# Patient Record
Sex: Male | Born: 1940 | Race: White | Hispanic: No | Smoking: Never smoker
Health system: Southern US, Community
[De-identification: ages and names within clinical notes are randomized; demographics above are authoritative.]

## PROBLEM LIST (undated history)

## (undated) DIAGNOSIS — I10 Essential (primary) hypertension: Secondary | ICD-10-CM

## (undated) DIAGNOSIS — D126 Benign neoplasm of colon, unspecified: Secondary | ICD-10-CM

## (undated) DIAGNOSIS — Z85820 Personal history of malignant melanoma of skin: Secondary | ICD-10-CM

## (undated) DIAGNOSIS — N529 Male erectile dysfunction, unspecified: Secondary | ICD-10-CM

## (undated) DIAGNOSIS — K589 Irritable bowel syndrome without diarrhea: Secondary | ICD-10-CM

## (undated) DIAGNOSIS — K7689 Other specified diseases of liver: Secondary | ICD-10-CM

## (undated) DIAGNOSIS — E785 Hyperlipidemia, unspecified: Secondary | ICD-10-CM

## (undated) DIAGNOSIS — N200 Calculus of kidney: Secondary | ICD-10-CM

## (undated) DIAGNOSIS — Z9889 Other specified postprocedural states: Secondary | ICD-10-CM

## (undated) DIAGNOSIS — R112 Nausea with vomiting, unspecified: Secondary | ICD-10-CM

## (undated) DIAGNOSIS — E559 Vitamin D deficiency, unspecified: Secondary | ICD-10-CM

## (undated) DIAGNOSIS — C439 Malignant melanoma of skin, unspecified: Secondary | ICD-10-CM

## (undated) HISTORY — DX: Malignant melanoma of skin, unspecified: C43.9

## (undated) HISTORY — DX: Benign neoplasm of colon, unspecified: D12.6

## (undated) HISTORY — DX: Hyperlipidemia, unspecified: E78.5

## (undated) HISTORY — DX: Male erectile dysfunction, unspecified: N52.9

## (undated) HISTORY — DX: Other specified diseases of liver: K76.89

## (undated) HISTORY — DX: Vitamin D deficiency, unspecified: E55.9

## (undated) HISTORY — PX: AMPUTATION: SHX166

## (undated) HISTORY — DX: Irritable bowel syndrome, unspecified: K58.9

## (undated) HISTORY — DX: Essential (primary) hypertension: I10

## (undated) HISTORY — DX: Calculus of kidney: N20.0

## (undated) HISTORY — DX: Personal history of malignant melanoma of skin: Z85.820

---
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
## 1965-12-17 HISTORY — PX: ORIF ANKLE FRACTURE: SUR919

## 1993-12-17 HISTORY — PX: MELANOMA EXCISION: SHX5266

## 1993-12-17 HISTORY — PX: LAMINECTOMY: SHX219

## 2000-10-17 ENCOUNTER — Encounter: Payer: Self-pay | Admitting: Family Medicine

## 2000-10-17 LAB — CONVERTED CEMR LAB: PSA: 1.2 ng/mL

## 2000-11-04 ENCOUNTER — Encounter: Payer: Self-pay | Admitting: Family Medicine

## 2000-11-04 LAB — CONVERTED CEMR LAB
PSA: 1.2 ng/mL
TSH: 1.92 microintl units/mL

## 2001-09-09 ENCOUNTER — Ambulatory Visit (HOSPITAL_COMMUNITY): Admission: RE | Admit: 2001-09-09 | Discharge: 2001-09-09 | Payer: Self-pay | Admitting: Neurosurgery

## 2001-09-09 ENCOUNTER — Encounter: Payer: Self-pay | Admitting: Neurosurgery

## 2002-03-17 ENCOUNTER — Encounter: Payer: Self-pay | Admitting: Family Medicine

## 2002-03-17 LAB — CONVERTED CEMR LAB: PSA: 0.7 ng/mL

## 2002-04-06 ENCOUNTER — Encounter: Payer: Self-pay | Admitting: Family Medicine

## 2002-04-06 LAB — CONVERTED CEMR LAB
PSA: 0.7 ng/mL
TSH: 1.55 microintl units/mL

## 2002-07-03 ENCOUNTER — Ambulatory Visit (HOSPITAL_COMMUNITY): Admission: RE | Admit: 2002-07-03 | Discharge: 2002-07-03 | Payer: Self-pay | Admitting: Gastroenterology

## 2002-07-20 ENCOUNTER — Encounter: Admission: RE | Admit: 2002-07-20 | Discharge: 2002-07-20 | Payer: Self-pay | Admitting: Neurosurgery

## 2002-07-20 ENCOUNTER — Encounter: Payer: Self-pay | Admitting: Neurosurgery

## 2002-12-17 ENCOUNTER — Encounter: Payer: Self-pay | Admitting: Family Medicine

## 2002-12-17 LAB — CONVERTED CEMR LAB: PSA: 0.9 ng/mL

## 2002-12-23 ENCOUNTER — Encounter: Payer: Self-pay | Admitting: Family Medicine

## 2002-12-23 LAB — CONVERTED CEMR LAB
PSA: 0.9 ng/mL
TSH: 1.8 microintl units/mL

## 2004-03-17 ENCOUNTER — Encounter: Payer: Self-pay | Admitting: Family Medicine

## 2004-03-17 LAB — CONVERTED CEMR LAB: PSA: 0.7 ng/mL

## 2004-03-21 ENCOUNTER — Encounter: Payer: Self-pay | Admitting: Family Medicine

## 2004-03-21 LAB — CONVERTED CEMR LAB: PSA: 0.7 ng/mL

## 2004-04-16 ENCOUNTER — Encounter: Payer: Self-pay | Admitting: Family Medicine

## 2004-04-16 LAB — CONVERTED CEMR LAB: PSA: 0.7 ng/mL

## 2004-05-10 ENCOUNTER — Encounter: Payer: Self-pay | Admitting: Family Medicine

## 2004-05-10 LAB — CONVERTED CEMR LAB
PSA: 0.7 ng/mL
TSH: 2.32 microintl units/mL

## 2004-10-17 ENCOUNTER — Ambulatory Visit: Payer: Self-pay | Admitting: Family Medicine

## 2005-02-20 ENCOUNTER — Ambulatory Visit: Payer: Self-pay | Admitting: Family Medicine

## 2005-02-23 ENCOUNTER — Ambulatory Visit: Payer: Self-pay | Admitting: Family Medicine

## 2005-03-12 ENCOUNTER — Encounter: Payer: Self-pay | Admitting: Family Medicine

## 2005-03-12 LAB — CONVERTED CEMR LAB: PSA: 0.8 ng/mL

## 2005-07-24 ENCOUNTER — Ambulatory Visit: Payer: Self-pay | Admitting: Family Medicine

## 2005-07-24 LAB — CONVERTED CEMR LAB: TSH: 2.4 microintl units/mL

## 2005-07-26 ENCOUNTER — Ambulatory Visit: Payer: Self-pay | Admitting: Family Medicine

## 2005-08-17 ENCOUNTER — Ambulatory Visit: Payer: Self-pay | Admitting: Family Medicine

## 2005-09-20 ENCOUNTER — Ambulatory Visit (HOSPITAL_COMMUNITY): Admission: RE | Admit: 2005-09-20 | Discharge: 2005-09-20 | Payer: Self-pay | Admitting: Urology

## 2005-09-22 ENCOUNTER — Emergency Department (HOSPITAL_COMMUNITY): Admission: EM | Admit: 2005-09-22 | Discharge: 2005-09-22 | Payer: Self-pay | Admitting: Emergency Medicine

## 2007-07-08 ENCOUNTER — Ambulatory Visit: Payer: Self-pay | Admitting: Family Medicine

## 2007-07-08 ENCOUNTER — Encounter: Payer: Self-pay | Admitting: Family Medicine

## 2007-07-08 DIAGNOSIS — Z87448 Personal history of other diseases of urinary system: Secondary | ICD-10-CM | POA: Insufficient documentation

## 2007-07-08 DIAGNOSIS — E785 Hyperlipidemia, unspecified: Secondary | ICD-10-CM | POA: Insufficient documentation

## 2007-07-08 DIAGNOSIS — D126 Benign neoplasm of colon, unspecified: Secondary | ICD-10-CM | POA: Insufficient documentation

## 2007-07-08 DIAGNOSIS — F528 Other sexual dysfunction not due to a substance or known physiological condition: Secondary | ICD-10-CM | POA: Insufficient documentation

## 2007-07-08 LAB — CONVERTED CEMR LAB
ALT: 21 units/L (ref 0–53)
AST: 18 units/L (ref 0–37)
Albumin: 4.1 g/dL (ref 3.5–5.2)
Alkaline Phosphatase: 32 units/L — ABNORMAL LOW (ref 39–117)
BUN: 11 mg/dL (ref 6–23)
Basophils Absolute: 0 10*3/uL (ref 0.0–0.1)
Basophils Relative: 0.7 % (ref 0.0–1.0)
Bilirubin, Direct: 0.1 mg/dL (ref 0.0–0.3)
CO2: 26 meq/L (ref 19–32)
Calcium: 8.9 mg/dL (ref 8.4–10.5)
Chloride: 106 meq/L (ref 96–112)
Cholesterol: 228 mg/dL (ref 0–200)
Creatinine, Ser: 1.1 mg/dL (ref 0.4–1.5)
Direct LDL: 171.9 mg/dL
Eosinophils Absolute: 0.4 10*3/uL (ref 0.0–0.6)
Eosinophils Relative: 7.4 % — ABNORMAL HIGH (ref 0.0–5.0)
GFR calc Af Amer: 86 mL/min
GFR calc non Af Amer: 71 mL/min
Glucose, Bld: 95 mg/dL (ref 70–99)
HCT: 41.4 % (ref 39.0–52.0)
HDL: 25.3 mg/dL — ABNORMAL LOW (ref >39.0)
Hemoglobin: 14.5 g/dL (ref 13.0–17.0)
Lymphocytes Relative: 33.7 % (ref 12.0–46.0)
MCHC: 35 g/dL (ref 30.0–36.0)
MCV: 92.2 fL (ref 78.0–100.0)
Monocytes Absolute: 0.5 10*3/uL (ref 0.2–0.7)
Monocytes Relative: 8.6 % (ref 3.0–11.0)
Neutro Abs: 2.7 10*3/uL (ref 1.4–7.7)
Neutrophils Relative %: 49.6 % (ref 43.0–77.0)
PSA: 0.97 ng/mL (ref 0.10–4.00)
Platelets: 166 10*3/uL (ref 150–400)
Potassium: 4.2 meq/L (ref 3.5–5.1)
RBC: 4.49 M/uL (ref 4.22–5.81)
RDW: 12.6 % (ref 11.5–14.6)
Sodium: 137 meq/L (ref 135–145)
TSH: 3.02 microintl units/mL (ref 0.35–5.50)
Total Bilirubin: 0.8 mg/dL (ref 0.3–1.2)
Total CHOL/HDL Ratio: 9
Total Protein: 6.5 g/dL (ref 6.0–8.3)
Triglycerides: 107 mg/dL (ref 0–149)
VLDL: 21 mg/dL (ref 0–40)
WBC: 5.4 10*3/uL (ref 4.5–10.5)

## 2007-07-15 ENCOUNTER — Ambulatory Visit: Payer: Self-pay | Admitting: Family Medicine

## 2007-08-01 ENCOUNTER — Ambulatory Visit: Payer: Self-pay | Admitting: Family Medicine

## 2007-08-04 ENCOUNTER — Encounter (INDEPENDENT_AMBULATORY_CARE_PROVIDER_SITE_OTHER): Payer: Self-pay | Admitting: *Deleted

## 2007-08-26 ENCOUNTER — Ambulatory Visit: Payer: Self-pay | Admitting: Family Medicine

## 2007-08-27 LAB — CONVERTED CEMR LAB
ALT: 22 units/L (ref 0–53)
AST: 20 units/L (ref 0–37)

## 2007-10-20 ENCOUNTER — Ambulatory Visit: Payer: Self-pay | Admitting: Family Medicine

## 2007-10-20 LAB — CONVERTED CEMR LAB
ALT: 19 units/L (ref 0–53)
AST: 17 units/L (ref 0–37)
Cholesterol: 138 mg/dL (ref 0–200)
HDL: 30.7 mg/dL — ABNORMAL LOW (ref >39.0)
LDL Cholesterol: 94 mg/dL (ref 0–99)
Total CHOL/HDL Ratio: 4.5
Triglycerides: 68 mg/dL (ref 0–149)
VLDL: 14 mg/dL (ref 0–40)

## 2007-10-22 ENCOUNTER — Ambulatory Visit: Payer: Self-pay | Admitting: Family Medicine

## 2007-11-24 ENCOUNTER — Encounter: Payer: Self-pay | Admitting: Family Medicine

## 2008-05-24 ENCOUNTER — Encounter: Payer: Self-pay | Admitting: Family Medicine

## 2008-09-14 ENCOUNTER — Ambulatory Visit: Payer: Self-pay | Admitting: Family Medicine

## 2008-09-14 DIAGNOSIS — I10 Essential (primary) hypertension: Secondary | ICD-10-CM | POA: Insufficient documentation

## 2008-10-08 ENCOUNTER — Telehealth: Payer: Self-pay | Admitting: Family Medicine

## 2008-10-12 ENCOUNTER — Ambulatory Visit: Payer: Self-pay | Admitting: Family Medicine

## 2008-10-12 LAB — CONVERTED CEMR LAB
BUN: 11 mg/dL (ref 6–23)
CO2: 31 meq/L (ref 19–32)
Calcium: 8.8 mg/dL (ref 8.4–10.5)
Chloride: 108 meq/L (ref 96–112)
Creatinine, Ser: 0.9 mg/dL (ref 0.4–1.5)
GFR calc Af Amer: 109 mL/min
GFR calc non Af Amer: 90 mL/min
Glucose, Bld: 82 mg/dL (ref 70–99)
Potassium: 4.1 meq/L (ref 3.5–5.1)
Sodium: 144 meq/L (ref 135–145)

## 2008-10-21 ENCOUNTER — Ambulatory Visit: Payer: Self-pay | Admitting: Family Medicine

## 2008-11-18 ENCOUNTER — Telehealth: Payer: Self-pay | Admitting: Family Medicine

## 2008-11-24 ENCOUNTER — Ambulatory Visit: Payer: Self-pay | Admitting: Family Medicine

## 2008-11-24 LAB — CONVERTED CEMR LAB
ALT: 18 units/L (ref 0–53)
AST: 19 units/L (ref 0–37)
Albumin: 4.1 g/dL (ref 3.5–5.2)
Alkaline Phosphatase: 36 units/L — ABNORMAL LOW (ref 39–117)
BUN: 12 mg/dL (ref 6–23)
Bilirubin, Direct: 0.1 mg/dL (ref 0.0–0.3)
CO2: 31 meq/L (ref 19–32)
Calcium: 9.1 mg/dL (ref 8.4–10.5)
Chloride: 109 meq/L (ref 96–112)
Cholesterol: 146 mg/dL (ref 0–200)
Creatinine, Ser: 1 mg/dL (ref 0.4–1.5)
GFR calc Af Amer: 96 mL/min
GFR calc non Af Amer: 79 mL/min
Glucose, Bld: 91 mg/dL (ref 70–99)
HDL: 27.1 mg/dL — ABNORMAL LOW (ref >39.0)
LDL Cholesterol: 102 mg/dL — ABNORMAL HIGH (ref 0–99)
Potassium: 4.5 meq/L (ref 3.5–5.1)
Sodium: 143 meq/L (ref 135–145)
Total Bilirubin: 0.8 mg/dL (ref 0.3–1.2)
Total CHOL/HDL Ratio: 5.4
Total Protein: 6.9 g/dL (ref 6.0–8.3)
Triglycerides: 84 mg/dL (ref 0–149)
VLDL: 17 mg/dL (ref 0–40)

## 2008-12-01 ENCOUNTER — Ambulatory Visit: Payer: Self-pay | Admitting: Family Medicine

## 2008-12-24 ENCOUNTER — Ambulatory Visit: Payer: Self-pay | Admitting: Family Medicine

## 2008-12-24 LAB — FECAL OCCULT BLOOD, GUAIAC: Fecal Occult Blood: NEGATIVE

## 2008-12-24 LAB — CONVERTED CEMR LAB
OCCULT 1: NEGATIVE
OCCULT 2: NEGATIVE
OCCULT 3: NEGATIVE

## 2008-12-27 ENCOUNTER — Encounter (INDEPENDENT_AMBULATORY_CARE_PROVIDER_SITE_OTHER): Payer: Self-pay | Admitting: *Deleted

## 2009-01-17 ENCOUNTER — Ambulatory Visit: Payer: Self-pay | Admitting: Family Medicine

## 2009-02-18 ENCOUNTER — Ambulatory Visit: Payer: Self-pay | Admitting: Gastroenterology

## 2009-03-04 ENCOUNTER — Ambulatory Visit: Payer: Self-pay | Admitting: Gastroenterology

## 2009-03-04 LAB — HM COLONOSCOPY

## 2009-06-29 ENCOUNTER — Encounter: Payer: Self-pay | Admitting: Family Medicine

## 2009-08-12 ENCOUNTER — Encounter: Admission: RE | Admit: 2009-08-12 | Discharge: 2009-08-12 | Payer: Self-pay | Admitting: Surgery

## 2009-08-12 DIAGNOSIS — K7689 Other specified diseases of liver: Secondary | ICD-10-CM | POA: Insufficient documentation

## 2009-10-03 ENCOUNTER — Observation Stay (HOSPITAL_COMMUNITY): Admission: RE | Admit: 2009-10-03 | Discharge: 2009-10-04 | Payer: Self-pay | Admitting: Surgery

## 2009-10-03 ENCOUNTER — Encounter: Payer: Self-pay | Admitting: Family Medicine

## 2009-10-03 ENCOUNTER — Encounter (INDEPENDENT_AMBULATORY_CARE_PROVIDER_SITE_OTHER): Payer: Self-pay | Admitting: Surgery

## 2009-10-03 DIAGNOSIS — R930 Abnormal findings on diagnostic imaging of skull and head, not elsewhere classified: Secondary | ICD-10-CM | POA: Insufficient documentation

## 2009-10-03 HISTORY — PX: CHOLECYSTECTOMY: SHX55

## 2009-10-19 ENCOUNTER — Encounter: Payer: Self-pay | Admitting: Family Medicine

## 2009-12-22 ENCOUNTER — Ambulatory Visit: Payer: Self-pay | Admitting: Family Medicine

## 2009-12-23 ENCOUNTER — Encounter: Admission: RE | Admit: 2009-12-23 | Discharge: 2009-12-23 | Payer: Self-pay | Admitting: Family Medicine

## 2010-01-25 ENCOUNTER — Ambulatory Visit: Payer: Self-pay | Admitting: Family Medicine

## 2010-01-25 LAB — CONVERTED CEMR LAB
ALT: 32 units/L (ref 0–53)
AST: 21 units/L (ref 0–37)
Albumin: 4.3 g/dL (ref 3.5–5.2)
Alkaline Phosphatase: 42 units/L (ref 39–117)
BUN: 12 mg/dL (ref 6–23)
Basophils Absolute: 0 10*3/uL (ref 0.0–0.1)
Basophils Relative: 0.5 % (ref 0.0–3.0)
Bilirubin, Direct: 0 mg/dL (ref 0.0–0.3)
CO2: 29 meq/L (ref 19–32)
Calcium: 10 mg/dL (ref 8.4–10.5)
Chloride: 107 meq/L (ref 96–112)
Cholesterol: 117 mg/dL (ref 0–200)
Creatinine, Ser: 1 mg/dL (ref 0.4–1.5)
Eosinophils Absolute: 0.4 10*3/uL (ref 0.0–0.7)
Eosinophils Relative: 7.2 % — ABNORMAL HIGH (ref 0.0–5.0)
GFR calc non Af Amer: 79.04 mL/min (ref >60)
Glucose, Bld: 88 mg/dL (ref 70–99)
HCT: 45.3 % (ref 39.0–52.0)
HDL: 34.9 mg/dL — ABNORMAL LOW (ref >39.00)
Hemoglobin: 14.8 g/dL (ref 13.0–17.0)
Iron: 105 ug/dL (ref 42–165)
LDL Cholesterol: 64 mg/dL (ref 0–99)
Lymphocytes Relative: 34.8 % (ref 12.0–46.0)
Lymphs Abs: 1.9 10*3/uL (ref 0.7–4.0)
MCHC: 32.6 g/dL (ref 30.0–36.0)
MCV: 94.8 fL (ref 78.0–100.0)
Monocytes Absolute: 0.5 10*3/uL (ref 0.1–1.0)
Monocytes Relative: 8.2 % (ref 3.0–12.0)
Neutro Abs: 2.7 10*3/uL (ref 1.4–7.7)
Neutrophils Relative %: 49.3 % (ref 43.0–77.0)
PSA: 1.04 ng/mL (ref 0.10–4.00)
Phosphorus: 4.7 mg/dL — ABNORMAL HIGH (ref 2.3–4.6)
Platelets: 184 10*3/uL (ref 150.0–400.0)
Potassium: 4.3 meq/L (ref 3.5–5.1)
RBC: 4.78 M/uL (ref 4.22–5.81)
RDW: 12.6 % (ref 11.5–14.6)
Sodium: 141 meq/L (ref 135–145)
TSH: 3.27 microintl units/mL (ref 0.35–5.50)
Total Bilirubin: 0.5 mg/dL (ref 0.3–1.2)
Total CHOL/HDL Ratio: 3
Total Protein: 7 g/dL (ref 6.0–8.3)
Triglycerides: 90 mg/dL (ref 0.0–149.0)
VLDL: 18 mg/dL (ref 0.0–40.0)
WBC: 5.5 10*3/uL (ref 4.5–10.5)

## 2010-01-26 LAB — CONVERTED CEMR LAB: Vit D, 25-Hydroxy: 23 ng/mL — ABNORMAL LOW (ref 30–89)

## 2010-01-30 ENCOUNTER — Ambulatory Visit: Payer: Self-pay | Admitting: Family Medicine

## 2010-01-30 DIAGNOSIS — E559 Vitamin D deficiency, unspecified: Secondary | ICD-10-CM | POA: Insufficient documentation

## 2010-05-11 ENCOUNTER — Telehealth: Payer: Self-pay | Admitting: Family Medicine

## 2010-07-25 ENCOUNTER — Encounter (INDEPENDENT_AMBULATORY_CARE_PROVIDER_SITE_OTHER): Payer: Self-pay | Admitting: *Deleted

## 2010-10-11 ENCOUNTER — Encounter: Payer: Self-pay | Admitting: Family Medicine

## 2010-12-28 ENCOUNTER — Encounter: Payer: Self-pay | Admitting: Gastroenterology

## 2010-12-28 ENCOUNTER — Other Ambulatory Visit: Payer: Self-pay | Admitting: Family Medicine

## 2010-12-28 ENCOUNTER — Ambulatory Visit
Admission: RE | Admit: 2010-12-28 | Discharge: 2010-12-28 | Payer: Self-pay | Source: Home / Self Care | Attending: Family Medicine | Admitting: Family Medicine

## 2010-12-28 DIAGNOSIS — Z9089 Acquired absence of other organs: Secondary | ICD-10-CM | POA: Insufficient documentation

## 2010-12-28 DIAGNOSIS — R1011 Right upper quadrant pain: Secondary | ICD-10-CM | POA: Insufficient documentation

## 2010-12-28 LAB — HEPATIC FUNCTION PANEL
ALT: 37 U/L (ref 0–53)
AST: 28 U/L (ref 0–37)
Albumin: 4.5 g/dL (ref 3.5–5.2)
Alkaline Phosphatase: 42 U/L (ref 39–117)
Bilirubin, Direct: 0.1 mg/dL (ref 0.0–0.3)
Total Bilirubin: 0.8 mg/dL (ref 0.3–1.2)
Total Protein: 7.3 g/dL (ref 6.0–8.3)

## 2010-12-28 LAB — GAMMA GT: GGT: 21 U/L (ref 7–51)

## 2010-12-28 LAB — LIPASE: Lipase: 22 U/L (ref 11.0–59.0)

## 2010-12-28 LAB — AMYLASE: Amylase: 104 U/L (ref 27–131)

## 2011-01-18 NOTE — Letter (Signed)
Summary: Dr.Todd Leotis Shames  Dr.Todd Gerkin,[facility],Note   Imported By: Beau Fanny 10/25/2009 10:39:43  _____________________________________________________________________  External Attachment:    Type:   Image     Comment:   External Document

## 2011-01-18 NOTE — Letter (Signed)
Summary: Results Follow up Letter  [facility]  [address] [place], [place] [address]   Phone: [phone]  Fax: [phone]    08/04/2007 MRN: [id number]  BROC CASPERS [address] [place], [place]  [address]  Dear Mr. Schedler,  The following are the results of your recent test(s):  Test         Result    Pap Smear:        Normal _____  Not Normal _____ Comments: ______________________________________________________ Cholesterol: LDL(Bad cholesterol):         Your goal is less than:         HDL (Good cholesterol):       Your goal is more than: Comments:  ______________________________________________________ Mammogram:        Normal _____  Not Normal _____ Comments:  ___________________________________________________________________ Hemoccult:        Normal __X___  Not normal _______ Comments:  NO BLOOD IN STOOL. THANK YOU FOR RETURNING THE HEMOCULT CARDS. PLEASE MAKE SURE TO REPEAT IN ONE YEAR.  _____________________________________________________________________ Other Tests:    We routinely do not discuss normal results over the telephone.  If you desire a copy of the results, or you have any questions about this information we can discuss them at your next office visit.   Sincerely,

## 2011-01-18 NOTE — Progress Notes (Signed)
Summary: Viagra Rx.  Phone Note Call from Patient Call back at Home Phone [phone]   Caller: Patient Call For: Shaune Leeks MD Summary of Call: Patient is asking for a Rx. for Viagra to be sent to [facility] on [address].  He says the two of you spoke about it at his last OV and you did not have any samples. Initial call taken by: Delilah Shan CMA (AAMA),  May 11, 2010 11:50 AM    New/Updated Medications: VIAGRA 100 MG TABS (SILDENAFIL CITRATE) 1/2-1 tab by mouth 1 hr prior to desired activity Prescriptions: VIAGRA 100 MG TABS (SILDENAFIL CITRATE) 1/2-1 tab by mouth 1 hr prior to desired activity  #10 x 5   Entered and Authorized by:   Shaune Leeks MD   Signed by:   Shaune Leeks MD on 05/11/2010   Method used:   Electronically to        [facility] [phone]* (retail)       [address]       [place], [place]  [address]       Ph: [phone]       Fax: [phone]   RxID:   [id number]

## 2011-01-18 NOTE — Assessment & Plan Note (Signed)
Summary: 3 MONTH FOLLOW UP/[facility]   Vital Signs:  Patient Profile:   70 Years Old Male Height:     69 inches (180.34 cm) Weight:      199 pounds Temp:     98.7 degrees F oral Pulse rate:   60 / minute Pulse rhythm:   regular BP sitting:   130 / 70  (left arm) Cuff size:   regular                 Chief Complaint:  3 month f/u.  History of Present Illness: Doing well with no complaints.  Feels fine and tolerating Simvastatin w/o difficulty.  Current Allergies: No known allergies       Physical Exam  General:     Well-developed,well-nourished,in no acute distress; alert,appropriate and cooperative throughout examination Head:     Normocephalic and atraumatic without obvious abnormalities. No apparent alopecia or balding. Eyes:     Conjunctiva clear bilaterally.  Ears:     External ear exam shows no significant lesions or deformities.  Otoscopic examination reveals clear canals, tympanic membranes are intact bilaterally without bulging, retraction, inflammation or discharge. Hearing is grossly normal bilaterally. Nose:     External nasal examination shows no deformity or inflammation. Nasal mucosa are pink and moist without lesions or exudates. Mouth:     Oral mucosa and oropharynx without lesions or exudates.  Teeth in good repair. Neck:     No deformities, masses, or tenderness noted. Chest Wall:     No deformities, masses, tenderness or gynecomastia noted. Lungs:     Normal respiratory effort, chest expands symmetrically. Lungs are clear to auscultation, no crackles or wheezes. Heart:     Normal rate and regular rhythm. S1 and S2 normal without gallop, murmur, click, rub or other extra sounds.    Impression & Recommendations:  Problem # 1:  HYPERLIPIDEMIA (ICD-272.4) Assessment: Improved Discussed labs..great response.  Can still improve , esp HDL...start regular walking, lose weight. His updated medication list for this problem includes:    Simvastatin 40  Mg Tabs (Simvastatin) ..... One tab by mouth at night  Labs Reviewed: Chol: 138 (10/20/2007)   HDL: 30.7 (10/20/2007)   LDL: 94 (10/20/2007)   TG: 68 (10/20/2007) SGOT: 17 (10/20/2007)   SGPT: 19 (10/20/2007) LDL 171 to 94!  Complete Medication List: 1)  Fish Oil 1000 Mg Caps (Omega-3 fatty acids) .... 2 q am 2)  Simvastatin 40 Mg Tabs (Simvastatin) .... One tab by mouth at night   Patient Instructions: 1)  RTA 8/09 for COMP EXAM, labs prior. 2)  RTC sooner as needed.    ]

## 2011-01-18 NOTE — Assessment & Plan Note (Signed)
Summary: 6 WEEK FOLLOW UP / LFW   Vital Signs:  Patient Profile:   70 Years Old Male Height:     69 inches (180.34 cm) Weight:      209 pounds Temp:     98.1 degrees F oral Pulse rate:   80 / minute Pulse rhythm:   regular BP sitting:   120 / 60  (left arm) Cuff size:   large  Vitals Entered By: Providence Crosby (January 17, 2009 11:03 AM)                 Chief Complaint:  6 week followup// taking 2 norvasc 5mg  daily.  History of Present Illness: Pt is here for followup of Htn, having had cough from Lisinopril so started him on Amlodopuine 5mg  a day. His pressure improved but was still over 140 after two weeks so he increased his dose to 5mg  two times a day. His pressure has been great and he has had no problems with the medication. He feels well today. He is looking for referral to GI in [place] and is to be coordinated today. He also asked for Viagra samples which we do not have.    Prior Medications Reviewed Using: Patient Recall  Current Allergies: No known allergies       Physical Exam  General:     Well-developed,well-nourished,in no acute distress; alert,appropriate and cooperative throughout examination Head:     Normocephalic and atraumatic without obvious abnormalities. No apparent alopecia or balding. Eyes:     Conjunctiva clear bilaterally.  Ears:     External ear exam shows no significant lesions or deformities.  Otoscopic examination reveals clear canals, tympanic membranes are intact bilaterally without bulging, retraction, inflammation or discharge. Hearing is grossly normal bilaterally. Nose:     External nasal examination shows no deformity or inflammation. Nasal mucosa are pink and moist without lesions or exudates. Mouth:     Oral mucosa and oropharynx without lesions or exudates.  Teeth in good repair. Neck:     No deformities, masses, or tenderness noted. Chest Wall:     No deformities, masses, tenderness or gynecomastia noted. Lungs:  Normal respiratory effort, chest expands symmetrically. Lungs are clear to auscultation, no crackles or wheezes. Heart:     Normal rate and regular rhythm. S1 and S2 normal without gallop, murmur, click, rub or other extra sounds.    Impression & Recommendations:  Problem # 1:  ESSENTIAL HYPERTENSION (ICD-401.9) Assessment: Improved Will cont 5mg  two times a day as he is tolerating that regimen well with good results. The following medications were removed from the medication list:    Amlodipine Besylate 5 Mg Tabs (Amlodipine besylate) ..... One tab by mouth at night.  His updated medication list for this problem includes:    Amlodipine Besylate 5 Mg Tabs (Amlodipine besylate) ..... One tab by mouth two times a day  BP today: 120/60 Prior BP: 160/90 (12/01/2008)  Labs Reviewed: Creat: 1.0 (11/24/2008) Chol: 146 (11/24/2008)   HDL: 27.1 (11/24/2008)   LDL: 102 (11/24/2008)   TG: 84 (11/24/2008)   Problem # 2:  ERECTILE DYSFUNCTION (ICD-302.72) Assessment: Unchanged Declined script, had no samples.  Complete Medication List: 1)  Simvastatin 40 Mg Tabs (Simvastatin) .... One tab by mouth at night 2)  Vitamin E Blend 400 Unit Caps (Vitamin e) .Marland Kitchen.. 1 daily by mouth 3)  Bayer Aspirin 325 Mg Tabs (Aspirin) .... Takes as needed as needed 4)  Lyrica 75 Mg Caps (Pregabalin) .... Takes as needed  5)  Amlodipine Besylate 5 Mg Tabs (Amlodipine besylate) .... One tab by mouth two times a day   Patient Instructions: 1)  RTC 5 mos   Prescriptions: AMLODIPINE BESYLATE 5 MG TABS (AMLODIPINE BESYLATE) one tab by mouth two times a day  #60 x 12   Entered and Authorized by:   Shaune Leeks MD   Signed by:   Shaune Leeks MD on 01/17/2009   Method used:   Electronically to        [facility] [phone]* (retail)       [address]       [place], [place]  [address]       Ph: [phone]       Fax: [phone]   RxID:   [id number]

## 2011-01-18 NOTE — Assessment & Plan Note (Signed)
Summary: DRILL BIT WENT INTO FINGER/CLE   Vital Signs:  Patient profile:   70 year old male Weight:      203.25 pounds BMI:     30.12 Temp:     98.3 degrees F oral Pulse rate:   72 / minute Pulse rhythm:   regular BP sitting:   150 / 80  (left arm) Cuff size:   regular  Vitals Entered By: Linde Gillis CMA Duncan Dull) (December 22, 2009 2:21 PM) CC: drill bit went into finger   History of Present Illness: Eddie Fox is a 70 y/o caucasian male who presents today due to a wound on the medial side of his little finger of his left hand.  Two days ago he accidentally drilled a drill bit into his hand while perfomring woodwork.  He initially used hydrogen peroxide and has kept the would clean with soap and water at night.  During the day he bandages the wound and uses topical antibiotic creams.  He states his last tetanus shot was in 2003.   He also notes that he needs a f/u chest x-rayof 10/18 per Dr. Gerrit Friends.  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 One Tab By Mouth At Night 2)  Vitamin E Blend 400 Unit Caps (Vitamin E) .Marland Kitchen.. 1 Daily By Mouth 3)  Bayer Aspirin 325 Mg Tabs (Aspirin) .... Takes As Needed As Needed 4)  Lyrica 75 Mg Caps (Pregabalin) .... Takes As Needed 5)  Amlodipine Besylate 5 Mg Tabs (Amlodipine Besylate) .... One Tab By Mouth Two Times A  Day  Allergies (verified): No Known Drug Allergies  Physical Exam  General:  Well-developed,well-nourished,in no acute distress; alert,appropriate and cooperative throughout examination Head:  Normocephalic and atraumatic without obvious abnormalities. No apparent alopecia or balding. Eyes:  Conjunctiva clear bilat. Ears:  External ear exam shows no significant lesions or deformities.  Otoscopic examination reveals clear canals, tympanic membranes are intact bilaterally without bulging, retraction, inflammation or discharge. Hearing is grossly normal bilaterally. Nose:  External nasal examination shows no deformity or inflammation. Nasal mucosa are pink and moist without lesions or exudates. Mouth:  Oral mucosa and oropharynx without lesions or exudates.  Teeth in good repair. Lungs:  Normal respiratory effort, chest expands symmetrically. Lungs are clear to auscultation, no crackles or wheezes. Heart:  Normal rate and regular rhythm. S1 and S2 normal without gallop, murmur, click, rub or other extra sounds. Extremities:  Medial L hand little fingerwound present.  No erythem or swelling present. No discarge, difficult to assess if wound invlolves the phalanx.   Impression & Recommendations:  Problem # 1:  FINGER SUP FB W/O MAJ OPEN WOUND&W/O MENTION INF (ICD-915.6) Assessment New Healing well but must insure did not involve the bone. It will need antibiotics if it did involve. OItherwise cont trmt as he is. Orders: Radiology Referral (Radiology)  Problem # 2:  NONSPCIFC ABN FINDING RAD &  OTH EXAM LUNG FIELD (ICD-793.1) Assessment: Unchanged Will get followup CXR to compare.  Problem # 3:  ESSENTIAL HYPERTENSION (ICD-401.9) Assessment: Unchanged Slightly elevated but will observe. His updated medication list for this problem includes:    Amlodipine Besylate 5 Mg Tabs (Amlodipine besylate) ..... One tab by mouth two times a day  BP today: 150/80 Prior BP: 120/60  (01/17/2009)  Labs Reviewed: K+: 4.5 (11/24/2008) Creat: : 1.0 (11/24/2008)   Chol: 146 (11/24/2008)   HDL: 27.1 (11/24/2008)   LDL: 102 (11/24/2008)   TG: 84 (11/24/2008)  Complete Medication List: 1)  Simvastatin 40 Mg Tabs (Simvastatin) .... One tab by mouth at night 2)  Vitamin E Blend 400 Unit Caps (Vitamin e) .Marland Kitchen.. 1 daily by mouth 3)  Bayer Aspirin 325 Mg Tabs (Aspirin) .... Takes as needed as needed 4)  Lyrica 75 Mg Caps (Pregabalin) .... Takes as needed 5)  Amlodipine Besylate 5 Mg Tabs (Amlodipine besylate) .... One tab by mouth two times a day  Other Orders: TD Toxoids IM 7 YR + (45409) Admin 1st Vaccine ([address]) Admin 1st Vaccine [facility]) [phone])  Patient Instructions: 1)  Refer for CXR and finger Xray. 2)  Td today.  Current Allergies (reviewed today): No known allergies    Tetanus/Td Vaccine    Vaccine Type: Td    Site: right deltoid    Mfr: Sanofi Pasteur    Dose: 0.5 ml    Route: IM    Given by: Linde Gillis CMA (AAMA)    Exp. Date: 12/22/2011    Lot #: [id number]    VIS given: 11/04/07 version given December 22, 2009.

## 2011-01-18 NOTE — Procedures (Signed)
Summary: Colonoscopy   Colonoscopy  Procedure date:  03/04/2009  Findings:      Location:  [facility].    Procedures Next Due Date:    Colonoscopy: 03/2014  COLONOSCOPY PROCEDURE REPORT  PATIENT:  Eddie Fox, Eddie Fox  MR#:  [id number] BIRTHDATE:   01-03-1941, 67 yrs. old   GENDER:   male  ENDOSCOPIST:   Rachael Fee, MD Referred by: Laurita Quint, M.D.  PROCEDURE DATE:  03/04/2009 PROCEDURE:  Colonoscopy, diagnostic ASA CLASS:   Class II INDICATIONS: history of pre-cancerous (adenomatous) colon polyps (Dr. Marya Amsler previously)  MEDICATIONS:    Fentanyl 75 mcg IV, Versed 7 mg IV  DESCRIPTION OF PROCEDURE:   After the risks benefits and alternatives of the procedure were thoroughly explained, informed consent was obtained.  Digital rectal exam was performed and revealed no rectal masses.   The LB [id number] endoscope was introduced through the anus and advanced to the cecum, which was identified by both the appendix and ileocecal valve, without limitations.  The quality of the prep was good, using MoviPrep.  The instrument was then slowly withdrawn as the colon was fully examined. <<PROCEDUREIMAGES>>      <<OLD IMAGES>>  FINDINGS:  Mild diverticulosis was found sigmoid to descending  Internal and external hemorrhoids were found. These were small and non-thrombosed.  This was otherwise a normal examination (see image2, image3, and image4).  No polyps or cancers were seen.   Retroflexed views in the rectum revealed no abnormalities.    The scope was then withdrawn from the patient and the procedure completed.  COMPLICATIONS:   None  ENDOSCOPIC IMPRESSION:  1) Mild diverticulosis in the sigmoid to descending  2) Internal and external hemorrhoids  3) Otherwise normal examination  4) No polyps or cancers  RECOMMENDATIONS:  1) Given your personal history of pre-cancerous polyps (Dr. Melchor Amour), you will need a repeat colonoscopy in 5 years.  REPEAT  EXAM:   5 year colonoscopy   _______________________________ Rachael Fee, MD  CC: Laurita Quint, MD       Appended Document: Colonoscopy    Clinical Lists Changes  Observations: Added new observation of PAST SURG HX: TRAUMATIC AMPUTATION  DISTAL PHALANX OF R THUMB  FX R ANKLE ORIF 1967 BROKEN BACK  FELL OUT OF TREE STAND  1995 LAMINECTOMY L3-T11 H[place] RODS IN BACK (RDR ROY)  1995 DISLOCATED LEFT SHOULDER (DR WHITFIELD) 1995 MELANOMA EXCISION R UPPER ABD (DR JONES) 1995 BCC R UPPER ARM 10/1994 COLONOSCOPY  POLYPECTOMY (DR [facility]) 02/1997 COLONOSCOPY--NO POLYPS (DR. WISEMANN):(06/2002)----REPEAT IN 5 YEARS BACK INJECTION  09/2002 COLONOSCOPY  MILD DIVERTICS INT AND EXT HEMMS O/W NML NO POLYPS (DR Farmer Mccahill) 03/04/2009 (03/06/2009 9:35)       Past Surgical History:    TRAUMATIC AMPUTATION  DISTAL PHALANX OF R THUMB     FX R ANKLE ORIF 1967    BROKEN BACK  FELL OUT OF TREE STAND  1995    LAMINECTOMY L3-T11 H[place] RODS IN BACK (RDR ROY)  1995    DISLOCATED LEFT SHOULDER (DR WHITFIELD) 1995    MELANOMA EXCISION R UPPER ABD (DR JONES) 1995    BCC R UPPER ARM 10/1994    COLONOSCOPY  POLYPECTOMY (DR [facility]) 02/1997    COLONOSCOPY--NO POLYPS (DR. WISEMANN):(06/2002)----REPEAT IN 5 YEARS    BACK INJECTION  09/2002    COLONOSCOPY  MILD DIVERTICS INT AND EXT HEMMS O/W NML NO POLYPS (DR Carina Chaplin) 03/04/2009

## 2011-01-18 NOTE — Assessment & Plan Note (Signed)
Summary: BLOOD PRESSURE FLUCTUATING,LIGHTHEADED/BIR   Vital Signs:  Patient Profile:   70 Years Old Male Height:     69 inches (180.34 cm) Weight:      202 pounds Temp:     98.4 degrees F oral Pulse rate:   56 / minute Pulse rhythm:   regular Resp:     20 per minute BP sitting:   160 / 70  (left arm) Cuff size:   regular  Vitals Entered ByProvidence Crosby (September 14, 2008 10:02 AM)                 Chief Complaint:  blood pressure fluctuating // lightheaded.  History of Present Illness: Pt here for headache with BP fluctuation, mostly high. Thinks fish oil has helped.  Hearyt rate went to 46 Sat nite. Has significant head discomfort and slight nausea. Father had HBP also.    Prior Medications Reviewed Using: Patient Recall  Current Allergies: No known allergies    Family History:    FatherDECEASED 85 YOA CHF, HTN:     Mother: DECEASED 92 YOA (Nov 22, 2001) CHF    HTN, DM, STROKE    Brother dec 63 Trauma to head from fall     2 SISTERS BOTH  WITH HTN    CV: PATERNAL SIDE    HBP: +FATHER, + MOTHER, +SISTERS    DM: + MOTHER    GOUT/ARTHRITIS:     PROSTATE CANCER: MELANOMA SELF    BREAST/OVARIAN/UTERINE CANCER? ?MGM    COLON CANCER:    DEPRESSION: NEGATIVE    ETOH/DRUG ABUSE: NEGATIVE    OTHER: STROKE + MOTHER     Physical Exam  General:     Well-developed,well-nourished,in no acute distress; alert,appropriate and cooperative throughout examination Head:     Normocephalic and atraumatic without obvious abnormalities. No apparent alopecia or balding. Eyes:     Conjunctiva clear bilaterally.  Ears:     External ear exam shows no significant lesions or deformities.  Otoscopic examination reveals clear canals, tympanic membranes are intact bilaterally without bulging, retraction, inflammation or discharge. Hearing is grossly normal bilaterally. Nose:     External nasal examination shows no deformity or inflammation. Nasal mucosa are pink and moist without lesions  or exudates. Mouth:     Oral mucosa and oropharynx without lesions or exudates.  Teeth in good repair. Neck:     No deformities, masses, or tenderness noted. Chest Wall:     No deformities, masses, tenderness or gynecomastia noted. Lungs:     Normal respiratory effort, chest expands symmetrically. Lungs are clear to auscultation, no crackles or wheezes. Heart:     Normal rate and regular rhythm. S1 and S2 normal without gallop, murmur, click, rub or other extra sounds. Abdomen:     Bowel sounds positive,abdomen soft and non-tender without masses, organomegaly or hernias noted.    Impression & Recommendations:  Problem # 1:  ESSENTIAL HYPERTENSION (ICD-401.9) Assessment: New Start Lisinopril. Will avoid rate limiters due to slow heartrate. Bmet next time. His updated medication list for this problem includes:    Lisinopril 5 Mg Tabs (Lisinopril) ..... One tab by mouth at night.  BP today: 160/70 Prior BP: 130/70 (10/22/2007)  Labs Reviewed: Creat: 1.1 (07/08/2007) Chol: 138 (10/20/2007)   HDL: 30.7 (10/20/2007)   LDL: 94 (10/20/2007)   TG: 68 (10/20/2007)   Complete Medication List: 1)  Fish Oil 1000 Mg Caps (Omega-3 fatty acids) .... 2 q am 2)  Simvastatin 40 Mg Tabs (Simvastatin) .... One tab by mouth  at night 3)  Vitamin E Blend 400 Unit Caps (Vitamin e) .Marland Kitchen.. 1 daily by mouth 4)  Bayer Aspirin 325 Mg Tabs (Aspirin) .... Takes as needed as needed 5)  Lisinopril 5 Mg Tabs (Lisinopril) .... One tab by mouth at night.   Patient Instructions: 1)  RTC 6 weeks. BMET prior 2)  Check H/A and Nausea.   Prescriptions: LISINOPRIL 5 MG TABS (LISINOPRIL) one tab by mouth at night.  #30 x 12   Entered and Authorized by:   Shaune Leeks MD   Signed by:   Shaune Leeks MD on 09/14/2008   Method used:   Electronically to        [facility]* (retail)       [address]       [place], [place]  [address]       Ph: [phone]       Fax: [phone]   RxID:    [id number]  ]

## 2011-01-18 NOTE — Assessment & Plan Note (Signed)
Summary: 6 WK F/U  DLO   Vital Signs:  Patient Profile:   70 Years Old Male Height:     69 inches (180.34 cm) Weight:      202 pounds Temp:     98 degrees F oral Pulse rate:   76 / minute Pulse rhythm:   regular BP sitting:   130 / 80  (left arm) Cuff size:   regular  Vitals Entered By: Providence Crosby (October 21, 2008 10:30 AM)                 Chief Complaint:  6 WEEK FOLLOWUP BP.  History of Present Illness: Here for followup of BP, started on Lisinopril because of avoiding rate limiters from low rate baseline. He has tolerated the Lisnopril without difficulty . He takes it at night and last night had some increased urination but otherwise has not noticed any problems. He has not had lightheadedness like before altho he still occas has frontal light dull headache. OBTW, has minimal desire for and trouble maintaining erecctions. Asked if I could give him some Viagra. I had no samples and he declined a script.    Prior Medications Reviewed Using: Patient Recall  Current Allergies: No known allergies       Physical Exam  General:     Well-developed,well-nourished,in no acute distress; alert,appropriate and cooperative throughout examination Head:     Normocephalic and atraumatic without obvious abnormalities. No apparent alopecia or balding. Eyes:     Conjunctiva clear bilaterally.  Ears:     External ear exam shows no significant lesions or deformities.  Otoscopic examination reveals clear canals, tympanic membranes are intact bilaterally without bulging, retraction, inflammation or discharge. Hearing is grossly normal bilaterally. Nose:     External nasal examination shows no deformity or inflammation. Nasal mucosa are pink and moist without lesions or exudates. Mouth:     Oral mucosa and oropharynx without lesions or exudates.  Teeth in good repair. Neck:     No deformities, masses, or tenderness noted. Chest Wall:     No deformities, masses, tenderness or  gynecomastia noted. Lungs:     Normal respiratory effort, chest expands symmetrically. Lungs are clear to auscultation, no crackles or wheezes. Heart:     Normal rate and regular rhythm. S1 and S2 normal without gallop, murmur, click, rub or other extra sounds. Abdomen:     Bowel sounds positive,abdomen soft and non-tender without masses, organomegaly or hernias noted.    Impression & Recommendations:  Problem # 1:  ESSENTIAL HYPERTENSION (ICD-401.9) Assessment: Improved Will increase med. His updated medication list for this problem includes:    Lisinopril 10 Mg Tabs (Lisinopril) ..... One tab by mouth at night  BP today: 130/80  BP by me 160/95 Prior BP: 150/88 (10/12/2008)  Labs Reviewed: Creat: 0.9 (10/12/2008) Chol: 138 (10/20/2007)   HDL: 30.7 (10/20/2007)   LDL: 94 (10/20/2007)   TG: 68 (10/20/2007)   Complete Medication List: 1)  Fish Oil 1000 Mg Caps (Omega-3 fatty acids) .... 2 q am 2)  Simvastatin 40 Mg Tabs (Simvastatin) .... One tab by mouth at night 3)  Vitamin E Blend 400 Unit Caps (Vitamin e) .Marland Kitchen.. 1 daily by mouth 4)  Bayer Aspirin 325 Mg Tabs (Aspirin) .... Takes as needed as needed 5)  Lisinopril 10 Mg Tabs (Lisinopril) .... One tab by mouth at night   Patient Instructions: 1)  RTC 6 weeks, BMET 401.1 prior 2)  Check erections again next time.   Prescriptions:  LISINOPRIL 10 MG TABS (LISINOPRIL) one tab by mouth at night  #30 x 12   Entered and Authorized by:   Shaune Leeks MD   Signed by:   Shaune Leeks MD on 10/21/2008   Method used:   Print then Give to Patient   RxID:   [id number]  ]

## 2011-01-18 NOTE — Progress Notes (Signed)
Summary: BP still up  Phone Note Call from Patient Call back at Home Phone [phone]   Caller: Patient Call For: dr Wing Schoch Summary of Call: Pt states he was given a new BP medicine about 3 weeks ago but he says his BP is not coming down-- was 171/73 this am, 181/ 82 last night. He is taking this two times a day.  His pulse is running in the 50's.   He is asking if you can give him something stronger.  He uses [facility] [address]. Initial call taken by: Lowella Petties,  October 08, 2008 10:21 AM  Follow-up for Phone Call        Have pt take two of Lisinopril until  seen as scheduled. Follow-up by: Shaune Leeks MD,  October 09, 2008 8:27 PM  Additional Follow-up for Phone Call Additional follow up Details #1::        Advised pt. Additional Follow-up by: Lowella Petties,  October 11, 2008 9:41 AM

## 2011-01-18 NOTE — Assessment & Plan Note (Signed)
Summary: CHECK UP/CLE   Vital Signs:  Patient Profile:   70 Years Old Male Height:     69 inches (180.34 cm) Weight:      201 pounds Temp:     98.7 degrees F oral Pulse rate:   60 / minute Pulse rhythm:   regular BP sitting:   130 / 70  (left arm) Cuff size:   regular  Vitals Entered ByProvidence Crosby (July 15, 2007 3:01 PM)               Chief Complaint:  CPX/ HEMOCULT CARDS TO PATIENT.  History of Present Illness: Doing ok except too muchpressure in RUQ of abd esp after eating.  Feels stuffed all the time. Often feels bloated and burpy after eating.  He had right shoulder operation since seen last (02/13/07) and now wants to go back to OR for manipulation under anesthesia...shoulder itself hurts more than prevoiously ltho neck pain is resolved.  Current Allergies: No known allergies    Family History:    FatherDECEASED 85 YOA CHF, HTN:     Mother: DECEASED 55 YOA (30-Oct-2001)    HTN, DM, STROKE    Brother dec 63 Trauma to head from fall     2 SISTERS BOTH  WITH HTN    CV: PATERNAL SIDE    HBP: +FATHER, + MOTHER, +SISTERS    DM: + MOTHER    GOUT/ARTHRITIS:     PROSTATE CANCER: MELANOMA SELF    BREAST/OVARIAN/UTERINE CANCER? 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      Denies chills, fatigue, fever, loss of appetite, malaise, sleep disorder, weakness, and weight loss.  Eyes      Denies blurring, discharge, double vision, eye irritation, eye pain, halos, itching, light sensitivity, red eye, vision loss-1 eye, and  vision loss-both eyes.  ENT      Denies decreased hearing, difficulty swallowing, ear discharge, earache, hoarseness, nasal congestion, nosebleeds, postnasal drainage, ringing in ears, sinus pressure, and sore throat.  CV      Denies bluish discoloration of lips or nails, chest pain or discomfort, difficulty breathing at night, difficulty breathing while lying down, fainting, fatigue, leg cramps with exertion, lightheadness, near fainting, palpitations, shortness of breath with exertion, swelling of feet, swelling of hands, and weight gain.  Resp      Denies chest discomfort, chest pain with inspiration, cough, coughing up blood, excessive snoring, hypersomnolence, morning headaches, pleuritic, shortness of breath, sputum productive, and wheezing.  GI      Denies abdominal pain, bloody stools, change in bowel habits, constipation, dark tarry stools, diarrhea, excessive appetite, gas, hemorrhoids, indigestion, loss of appetite, nausea, vomiting, vomiting blood, and yellowish skin color.      fullness of abdomen  GU      Denies decreased libido, discharge, dysuria, erectile dysfunction, genital sores, hematuria, incontinence, nocturia, urinary frequency, and urinary hesitancy.  MS      Denies  joint pain, joint redness, joint swelling, loss of strength, low back pain, mid back pain, muscle aches, muscle , cramps, muscle weakness, stiffness, and thoracic pain.  Derm      Complains of dryness.      Denies changes in color of skin, changes in nail beds, excessive perspiration, flushing, hair loss, insect bite(s), itching, lesion(s), poor wound healing, and rash.      malar areas of face   Physical Exam  General:     Well-developed,well-nourished,in no acute distress; alert,appropriate and cooperative throughout examination Head:     Normocephalic and atraumatic without obvious abnormalities. No apparent alopecia or balding. Eyes:     Conjunctiva clear bilaterally.  Ears:     External  ear exam shows no significant lesions or deformities.  Otoscopic examination reveals clear canals, tympanic membranes are intact bilaterally without bulging, retraction, inflammation or discharge. Hearing is grossly normal bilaterally. Nose:     External nasal examination shows no deformity or inflammation. Nasal mucosa are pink and moist without lesions or exudates. Mouth:     Oral mucosa and oropharynx without lesions or exudates.  Teeth in good repair. Neck:     No deformities, masses, or tenderness noted. Chest Wall:     No deformities, masses, tenderness or gynecomastia noted. Breasts:     No masses or gynecomastia noted Lungs:     Normal respiratory effort, chest expands symmetrically. Lungs are clear to auscultation, no crackles or wheezes. Heart:     Normal rate and regular rhythm. S1 and S2 normal without gallop, murmur, click, rub or other extra sounds. Abdomen:     Bowel sounds positive,abdomen soft and non-tender without masses, organomegaly or hernias noted. Minimal superior ventral hernia in midline above the umbilicus w/o signif defect felt. Rectal:     No external abnormalities noted. Normal sphincter tone. No rectal masses or tenderness. G neg. Genitalia:     Testes bilaterally descended without nodularity, tenderness or masses. No scrotal masses or lesions. No penis lesions or urethral discharge. Prostate:     Prostate gland firm and smooth, no enlargement, nodularity, tenderness, mass,  or induration. R pole slightly larger than L, 20gms. Msk:     No deformity or scoliosis noted of thoracic or lumbar spine.   Pulses:     R and L carotid,radial,femoral,dorsalis pedis and posterior tibial pulses are full and equal bilaterally Extremities:     No clubbing, cyanosis, edema, or deformity noted with normal full range of motion of all joints.   Neurologic:     No cranial nerve deficits noted. Station and gait are normal. Plantar reflexes are down-going bilaterally. DTRs are  symmetrical throughout. Sensory, motor and coordinative functions appear intact. Skin:     Intact without suspicious lesions or rashes. Multiple AKs, SKs and B9 moles. Cervical Nodes:     No lymphadenopathy noted Inguinal Nodes:     No significant adenopathy Psych:     Cognition and judgment appear intact. Alert and cooperative with normal attention span and concentration. No apparent delusions, illusions, hallucinations    Impression & Recommendations:  Problem # 1:  HYPERLIPIDEMIA (ICD-272.4) Assessment: Unchanged Start Simvastain. His updated medication list for this problem includes:    Simvastatin 40 Mg Tabs (Simvastatin) ..... 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  Problem # 3:  ERECTILE DYSFUNCTION (ICD-302.72) Assessment: Improved improved slightly...uses Cialis given by Urology.  Problem # 4:  COLONIC POLYPS (ICD-211.3) Assessment: Unchanged Last Colonoscopy 5 yrs ago.  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  Problem # 6:  SCREENING FOR MALIGNANT NEOPLASM, PROSTATE (ICD-V76.44) Assessment: Unchanged Clinical exam ok, PSA nml.  Medications Added to Medication List This Visit: 1)  Simvastatin 40 Mg Tabs (Simvastatin) .... One tab by mouth at night   Patient Instructions: 1)  SGOT/SGPT 6 wks  2)  SGOT./SGPT/Chol Prof 3mos , RTC thereafter. 3)  Trial of mustard, Citrucel,and Activia.   Prescriptions: SIMVASTATIN 40 MG  TABS (SIMVASTATIN) one tab by mouth at night  #30 x prn   Entered and Authorized by:   Shaune Leeks MD   Signed by:   Shaune Leeks MD on 07/15/2007   Method used:   Print then Give to Patient   RxID:   [id number]

## 2011-01-18 NOTE — Assessment & Plan Note (Signed)
Summary: RIGHT SIDE PAIN/RBH   Vital Signs:  Patient profile:   70 year old male Height:      69 inches Weight:      206.75 pounds BMI:     30.64 Temp:     98.5 degrees F oral Pulse rate:   72 / minute Pulse rhythm:   regular BP sitting:   120 / 80  (left arm) Cuff size:   regular  Vitals Entered By: Benny Lennert CMA Duncan Dull) (December 28, 2010 11:05 AM)  History of Present Illness: Chief complaint right side pain  70 year old male:  Right upper abdominal pain, and will have more intensified pain. Dr. Gerrit Friends - took out gallbladder. 09/2009.  indolent pain, worse with eating. It is not clear about any specific foods that make it worse.  Eating and drinking OK. Feels like it blows up and gets solid. gas-ex will help some.   Every morning when starting eating -- has been ongoing since after the surgery. Now this has been more than a year. Also has a ventral hernia.   right upper quadrant pain. Nonacute. More than a year. Generally postprandial. Aside from the Gas-X, he has not really tried anything else.  Allergies (verified): No Known Drug Allergies  Past History:  Past medical, surgical, family and social histories (including risk factors) reviewed, and no changes noted (except as noted below).  Past Medical History: Reviewed history from 07/08/2007 and no changes required. Hyperlipidemia:03/1999  Past Surgical History: Reviewed history from 10/04/2009 and no changes required. 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 WISEMANN):(06/2002)----REPEAT IN 5 YEARS BACK INJECTION  09/2002 COLONOSCOPY  MILD DIVERTICS INT AND EXT HEMMS O/W NML NO POLYPS (DR  JACOBS) 03/04/2009 ABD U/S GALLSTONES NO EVID CHOLEYCYSTITIS FATTY LIVER L KIDNEY STONE NO VENTR HERNIA 08/12/09 LAP CHOLEY (DR GERKIN) 10/03/2009  Family History: Reviewed history from 09/14/2008 and no changes required. FatherDECEASED 9 YOA CHF, HTN:  Mother: DECEASED 76 YOA (11/06/2001) CHF HTN, DM, STROKE Brother dec 63 Trauma to head from fall  2 SISTERS BOTH  WITH HTN CV: PATERNAL SIDE HBP: +FATHER, + MOTHER, +SISTERS DM: + MOTHER GOUT/ARTHRITIS:  PROSTATE CANCER: MELANOMA SELF BREAST/OVARIAN/UTERINE CANCER? ?MGM COLON CANCER: DEPRESSION: NEGATIVE ETOH/DRUG ABUSE: NEGATIVE OTHER: STROKE + MOTHER  Social History: Reviewed history from 07/15/2007 and no changes required. 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 GI:  Complains of abdominal pain, gas, and indigestion; denies bloody stools, change in bowel habits, constipation, dark tarry stools, diarrhea, excessive appetite, loss of appetite, nausea, vomiting, and vomiting blood.  Physical Exam  General:  Well-developed,well-nourished,in no acute distress; alert,appropriate and cooperative throughout examination Head:  Normocephalic and atraumatic without obvious abnormalities. No apparent alopecia or balding. Ears:  no external deformities.   Nose:  no external deformity.   Neck:  No deformities, masses, or tenderness noted. Lungs:  Normal respiratory effort, chest expands symmetrically. Lungs are clear to auscultation, no crackles or wheezes. Heart:  Normal rate and regular rhythm. S1 and S2 normal without gallop, murmur, click, rub or other extra sounds. Abdomen:  Bowel sounds positive,abdomen soft and minimally tender in the RUQ without masses, organomegaly or hernias noted. lap chole scars are noted.  large ventral hernia. Neurologic:  alert & oriented X3.   Psych:   Cognition and judgment appear intact. Alert and cooperative with normal attention span and concentration. No apparent delusions, illusions, hallucinations   Impression & Recommendations:  Problem # 1:  ABDOMINAL PAIN RIGHT UPPER QUADRANT (ICD-789.01) Assessment New abdominal pain, right upper quadrant, status post laparoscopic cholecystectomy. Subacute in nature. Etiology is exactly unclear. Obtain basic liver and pancreatic function testing. May be related to delayed emptying post gallbladder surgery, evaluation of ducts with ERCP versus MRCP may be necessary, but I'm not entirely sure how to proceed, and I would like to consult gastroenterology.  Orders: Venipuncture (04540) TLB-Hepatic/Liver Function Pnl (80076-HEPATIC) TLB-Amylase (82150-AMYL) TLB-Lipase (83690-LIPASE) TLB-GGT (Gamma GT) (82977-GGT) Gastroenterology Referral (GI)  Problem # 2:  CHOLECYSTECTOMY, LAPAROSCOPIC, HX OF (ICD-V45.79) Assessment: New  Orders: Venipuncture (98119) TLB-Hepatic/Liver Function Pnl (80076-HEPATIC) TLB-Amylase (82150-AMYL) TLB-Lipase (83690-LIPASE) TLB-GGT (Gamma GT) (82977-GGT) Gastroenterology Referral (GI)  Complete Medication List: 1)  Simvastatin 40 Mg Tabs (Simvastatin) .... One tab by mouth at night 2)  Bayer Aspirin 325 Mg Tabs (Aspirin) .... Takes as needed as needed 3)  Amlodipine Besylate 5 Mg Tabs (Amlodipine besylate) .... One tab by mouth two times a day 4)  Viagra 100 Mg Tabs (Sildenafil citrate) .... 1/2-1 tab by mouth 1 hr prior to desired activity 5)  Vitamin D3 1000 Unit Tabs (Cholecalciferol) .... Take 2 tablets daily 6)  Magnesium Oxide 400 Mg Tabs (Magnesium oxide) .... Take one tab daily 7)  Centrum Silver Ultra Mens Tabs (Multiple vitamins-minerals) .... Take one tablet dialy  Patient Instructions: 1)  Referral Appointment Information 2)  Day/Date: 3)  Time: 4)  Place/MD: 5)  Address: 6)  Phone/Fax: 7)  Patient given appointment information.  Information/Orders faxed/mailed.    Orders Added: 1)  Venipuncture [36415] 2)  TLB-Hepatic/Liver Function Pnl [80076-HEPATIC] 3)  TLB-Amylase [82150-AMYL] 4)  TLB-Lipase [83690-LIPASE] 5)  TLB-GGT (Gamma GT) [82977-GGT] 6)  Gastroenterology Referral [GI] 7)  Est. Patient Level IV [14782]    Current Allergies (reviewed today): No known allergies

## 2011-01-18 NOTE — Letter (Signed)
Summary: New Patient letter  [facility]  [address] [place], [place] [address]   Phone: [phone]  Fax: [phone]       12/28/2010 MRN: [id number]  [facility] [address] [place], [place]  [address]  Dear Eddie Fox,  Welcome to the Gastroenterology Division at [facility].    You are scheduled to see Dr.  Christella Hartigan on 02-02-2011 at 3:30pm on the 3rd floor at [facility], [address]  We ask that you try to arrive at our office 15 minutes prior to your appointment time to allow for check-in.  We would like you to complete the enclosed self-administered evaluation form prior to your visit and bring it with you on the day of your appointment.  We will review it with you.  Also, please bring a complete list of all your medications or, if you prefer, bring the medication bottles and we will list them.  Please bring your insurance card so that we may make a copy of it.  If your insurance requires a referral to see a specialist, please bring your referral form from your primary care physician.  Co-payments are due at the time of your visit and may be paid by cash, check or credit card.     Your office visit will consist of a consult with your physician (includes a physical exam), any laboratory testing he/she may order, scheduling of any necessary diagnostic testing (e.g. x-ray, ultrasound, CT-scan), and scheduling of a procedure (e.g. Endoscopy, Colonoscopy) if required.  Please allow enough time on your schedule to allow for any/all of these possibilities.    If you cannot keep your appointment, please call [phone] to cancel or reschedule prior to your appointment date.  This allows [place] the opportunity to schedule an appointment for another patient in need of care.  If you do not cancel or reschedule by 5 p.m. the business day prior to your appointment date, you will be charged a $50.00 late cancellation/no-show fee.    Thank you for  choosing [facility] for your medical needs.  We appreciate the opportunity to care for you.  Please visit [place] at our website  to learn more about our practice.                     Sincerely,                                                             The Gastroenterology Division

## 2011-01-18 NOTE — Consult Note (Signed)
Summary: [facility]/Dr. [facility]/Dr. Channing Mutters   Imported By: Eleonore Chiquito 12/23/2008 08:18:47  _____________________________________________________________________  External Attachment:    Type:   Image     Comment:   External Document

## 2011-01-18 NOTE — Progress Notes (Signed)
  Phone Note Call from Patient   Caller: Patient Summary of Call: Pt LMOM that he is having lab work done  on 11/24/08 and that he wants to get his cholesterol and his liver checked at that time Please call him at home and tell him to come fasting if you will order those labs. Initial call taken by: Carlton Adam,  November 18, 2008 12:15 PM  Follow-up for Phone Call        Please let pt know to come fasting for labs as scheduled. I'll di9scuss the results when I see him as scheduled. See if you can extend his appt with me to 30 mins. Thanks. Follow-up by: Shaune Leeks MD,  November 18, 2008 1:33 PM  Additional Follow-up for Phone Call Additional follow up Details #1::        patient notified and appointments made Additional Follow-up by: Providence Crosby,  November 18, 2008 2:50 PM

## 2011-01-18 NOTE — Assessment & Plan Note (Signed)
Summary: CPX   Vital Signs:  Patient profile:   70 year old male Weight:      201 pounds Temp:     98.6 degrees F oral Pulse rate:   72 / minute Pulse rhythm:   regular BP sitting:   128 / 74  (left arm) Cuff size:   regular  Vitals Entered By: Sydell Axon LPN (January 30, 2010 1:54 PM) CC: 30 Minute checkup, had a colonoscopy 03/10 by Dr. Christella Hartigan   History of Present Illness: Pt here for Comp Exam. He had CXR done middle of January and was read as no problem...was area of inflammation. He has had some congestion in the sinuses for the last two weeks.. He has been taking Mucinex with some improvement. he has also had some diccomfort in the area of the surgery for his gallbladder. Mild discomfort in [place] which lasts 15-16mins. This happened this AM after eating toast with margarine.  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) 2)  Nonspcifc Abn Finding Rad & Oth Exam Lung Field  (ICD-793.1) 3)  Finger Sup Fb w/o Maj Open Wound&w/o Mention Inf  (ICD-915.6) 4)  Nephrolithiasis of Left Kidney  (ICD-592.0) 5)  Gallstones  (ICD-574.20) 6)  Fatty Liver Disease  (ICD-571.8) 7)  Strep Throat  (ICD-034.0) 8)  Essential Hypertension  (ICD-401.9) 9)  Screening For Malignannt Neoplasm, Site Nec  (ICD-V76.49) 10)  Hyperlipidemia  (ICD-272.4) 11)  Peyronie's Disease, 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 One Tab By Mouth At  Night 2)  Vitamin E Blend 400 Unit Caps (Vitamin E) .Marland Kitchen.. 1 Daily By Mouth 3)  Bayer Aspirin 325 Mg Tabs (Aspirin) .... Takes As Needed As Needed 4)  Lyrica 75 Mg Caps (Pregabalin) .... Takes As Needed 5)  Amlodipine Besylate 5 Mg Tabs (Amlodipine Besylate) .... 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 WISEMANN):(06/2002)----REPEAT IN 5 YEARS BACK INJECTION  09/2002 COLONOSCOPY  MILD DIVERTICS INT AND EXT HEMMS O/W NML NO POLYPS (DR JACOBS) 03/04/2009 ABD U/S GALLSTONES NO EVID CHOLEYCYSTITIS FATTY LIVER L KIDNEY STONE NO VENTR HERNIA 08/12/09 LAP CHOLEY (DR GERKIN) 10/03/2009  Family History: Last updated: 09/14/2008 FatherDECEASED 61 YOA CHF, HTN:  Mother: DECEASED 52 YOA (11-22-01) CHF HTN, DM, STROKE Brother dec 63 Trauma to head from fall  2 SISTERS BOTH  WITH HTN CV: PATERNAL SIDE HBP: +FATHER, + MOTHER, +SISTERS DM: + MOTHER GOUT/ARTHRITIS:  PROSTATE CANCER: MELANOMA SELF BREAST/OVARIAN/UTERINE  CANCER? 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 Eyes:  Denies blurring, discharge, eye pain, and itching. ENT:  Complains of decreased hearing; denies ear discharge and earache; decreased.. CV:  Denies chest pain or discomfort, fainting, fatigue, palpitations, shortness of breath with exertion, and swelling of feet. Resp:  Denies cough, shortness of breath, and wheezing. GI:  Denies abdominal pain, bloody stools, change in bowel habits, constipation, dark tarry stools, diarrhea, indigestion, loss of appetite, nausea, vomiting, vomiting blood, and yellowish skin color. GU:  Complains of urinary frequency; denies discharge, dysuria, and nocturia; due to BP pill. MS:  Complains of low back pain; denies joint pain, joint redness, muscle aches, muscle weakness, and stiffness; S/p broken back.. Derm:  Complains of dryness; denies itching and rash. Neuro:  Denies memory loss, numbness, poor balance, tingling, and tremors.  Physical Exam  General:  Well-developed,well-nourished,in no acute distress; alert,appropriate and cooperative throughout examination Head:  Normocephalic and atraumatic without obvious abnormalities. No apparent alopecia or balding. Eyes:  Conjunctiva clear bilat. Ears:  External ear exam shows no significant lesions or deformities.  Otoscopic examination reveals clear canals, tympanic membranes are intact bilaterally without bulging, retraction, inflammation or discharge. Hearing is grossly normal bilaterally. Mild dullmness to LR. Nose:  External nasal examination shows no deformity or inflammation. Nasal mucosa are pink and moist without lesions or exudates. Mouth:  Oral mucosa and oropharynx without lesions or exudates.  Teeth in good repair. Neck:  No deformities, masses, or tenderness noted. Chest Wall:  No deformities, masses, tenderness or gynecomastia noted. Breasts:  No masses or gynecomastia  noted Lungs:  Normal respiratory effort, chest expands symmetrically. Lungs are clear to auscultation, no crackles or wheezes. Heart:  Normal rate and regular rhythm. S1 and S2 normal without gallop, murmur, click, rub or other extra sounds. Abdomen:  Bowel sounds positive,abdomen soft and non-tender without masses, organomegaly or hernias noted. Rectal:  No external abnormalities noted. Normal sphincter tone. No rectal masses or tenderness. G neg. Genitalia:  Testes bilaterally descended without nodularity, tenderness or masses. No scrotal masses or lesions. No penis lesions or urethral discharge. Prostate:  Prostate gland firm and smooth, no enlargement, nodularity, tenderness, mass, asymmetry or induration. 10 gms. Msk:  No deformity or scoliosis noted of thoracic or lumbar spine.   Pulses:  R and L carotid,radial,femoral,dorsalis pedis and posterior tibial pulses are full and equal bilaterally Extremities:  No clubbing, cyanosis, edema, or deformity noted with normal full range of motion of all joints.   Neurologic:  No cranial nerve deficits noted. Station and gait are normal. Sensory, motor and coordinative functions appear intact. Skin:  Intact without suspicious lesions or rashes Cervical Nodes:  No lymphadenopathy noted Inguinal Nodes:  No significant adenopathy Psych:  Cognition and judgment appear intact. Alert and cooperative with normal attention span and concentration. No apparent delusions, illusions, hallucinations   Impression & Recommendations:  Problem # 1:  HEALTH MAINTENANCE EXAM (ICD-V70.0) Assessment Comment Only  Problem # 2:  NONSPCIFC ABN FINDING RAD & OTH EXAM LUNG FIELD (ICD-793.1) Assessment: Unchanged Stable per F/U CXR. No further  f/u needed.  Problem # 3:  FATTY LIVER DISEASE (ICD-571.8) Assessment: Improved LFTs nml.  Problem # 4:  ESSENTIAL HYPERTENSION (ICD-401.9) Assessment: Improved Good, nos better. His updated medication list for this problem  includes:    Amlodipine Besylate 5 Mg Tabs (Amlodipine besylate) ..... One tab by mouth two times a day  BP today: 128/74 Prior BP: 150/80 (12/22/2009)  Labs Reviewed: K+: 4.3 (01/25/2010) Creat: : 1.0 (01/25/2010)   Chol: 117 (01/25/2010)   HDL: 34.90 (01/25/2010)   LDL: 64 (01/25/2010)   TG: 90.0 (01/25/2010)  Problem # 5:  HYPERLIPIDEMIA (ICD-272.4) HDL low but o/w good....asked to increase activity. His updated medication list for this problem includes:    Simvastatin 40 Mg Tabs (Simvastatin) ..... One tab by mouth at night  Labs Reviewed: SGOT: 21 (01/25/2010)   SGPT: 32 (01/25/2010)   HDL:34.90 (01/25/2010), 27.1 (11/24/2008)  LDL:64 (01/25/2010), 102 (46/96/2952)  Chol:117 (01/25/2010), 146 (11/24/2008)  Trig:90.0 (01/25/2010), 84 (11/24/2008)  Problem # 6:  SCREENING FOR MALIGNANT NEOPLASM, PROSTATE (ICD-V76.44) Assessment: Unchanged Stable PSA and exam.  Problem # 7:  VITAMIN D DEFICIENCY (ICD-268.9) Assessment: New Start OTC Vit D 1000Iu daily.   Complete Medication List: 1)  Simvastatin 40 Mg Tabs (Simvastatin) .... One tab by mouth at night 2)  Vitamin E Blend 400 Unit Caps (Vitamin e) .Marland Kitchen.. 1 daily by mouth 3)  Bayer Aspirin 325 Mg Tabs (Aspirin) .... Takes as needed as needed 4)  Lyrica 75 Mg Caps (Pregabalin) .... Takes as needed 5)  Amlodipine Besylate 5 Mg Tabs (Amlodipine besylate) .... One tab by mouth two times a day  Patient Instructions: 1)  RTC 6 mos for recheck. 2)  For congestion, Take Guaifenesin by going to [facility] or [facility] and getting MUCOUS RELIEF EXPECTORANT (400mg ), take 11/2 tabs by mouth AM and NOON. 3)  Drink lots of fluids anytime taking Guaifenesin.   Current Allergies (reviewed today): No known allergies

## 2011-01-18 NOTE — Assessment & Plan Note (Signed)
Summary: cpx // not a new patient/whc   Vital Signs:  Patient Profile:   70 Years Old Male Height:     69 inches (180.34 cm) Weight:      201 pounds Temp:     98.6 degrees F oral Pulse rate:   60 / minute Pulse rhythm:   regular BP sitting:   160 / 90  (left arm) Cuff size:   regular  Vitals Entered ByMarland Kitchen Providence Crosby (December 01, 2008 10:48 AM)                 Chief Complaint:  CHECK UP// COUGHING WITH LISINOPRIL// NEEDS COLONOSCOPY WANTS TO GO TO [place].  History of Present Illness: Pt here for Comp Exam. Was recently started on Lisinopril for BP which has now started cough. Pt has had low heart rates at times as well.  He has no other complaints. He has ventral hernia, has pain in somewhat local area. He has had melanoma excision from that area.  Pt is overdue colonoscopy...he has had polyps in past. Wants to be seen at [facility].    Prior Medications Reviewed Using: Patient Recall  Current Allergies: No known allergies      Review of Systems  General      Denies chills, fatigue, fever, loss of appetite, malaise, sleep disorder, sweats, weakness, and weight loss.  Eyes      Denies blurring, discharge, double vision, eye irritation, eye pain, halos, itching, light sensitivity, red eye, vision loss-1 eye, and vision loss-both eyes.  ENT      Complains of decreased hearing.      Denies difficulty swallowing, ear discharge, earache, hoarseness, nasal congestion, nosebleeds, postnasal drainage, ringing in ears, sinus pressure, and sore throat.      mild, worked at [employer].  CV      Denies bluish discoloration of lips or nails, chest pain or discomfort, difficulty breathing at night, difficulty breathing while lying down, fainting, fatigue, leg cramps with exertion, lightheadness, near fainting, palpitations, shortness of breath with exertion, swelling of feet, swelling of hands, and weight gain.  Resp      Complains of wheezing.      Denies  chest discomfort, chest pain with inspiration, cough, coughing up blood, excessive snoring, hypersomnolence, morning headaches, pleuritic, shortness of breath, and sputum productive.      in AM.  GI      Denies abdominal pain, bloody stools, change in bowel habits, constipation, dark tarry stools, diarrhea, excessive appetite, gas, hemorrhoids, indigestion, loss of appetite, nausea, vomiting, vomiting blood, and yellowish skin color.  GU      Denies decreased libido, discharge, dysuria, erectile dysfunction, genital sores, hematuria, incontinence, nocturia, urinary frequency, and urinary hesitancy.  MS      Denies joint pain, joint redness, joint swelling, loss of strength, low back pain, mid back pain, muscle aches, muscle , cramps, muscle weakness, stiffness, and thoracic pain.  Derm      Complains of dryness.      Denies changes in color of skin, changes in nail beds, excessive perspiration, flushing, hair loss, insect bite(s), itching, lesion(s), poor wound healing, and rash.      meds from [facility] for face.  Neuro      Denies brief paralysis, difficulty with concentration, disturbances in coordination, falling down, headaches, inability to speak, memory loss, numbness, poor balance, seizures, sensation of room spinning, tingling, tremors, visual disturbances, and weakness.   Physical Exam  General:     Well-developed,well-nourished,in  no acute distress; alert,appropriate and cooperative throughout examination Head:     Normocephalic and atraumatic without obvious abnormalities. No apparent alopecia or balding. Eyes:     Conjunctiva clear bilaterally.  Ears:     External ear exam shows no significant lesions or deformities.  Otoscopic examination reveals clear canals, tympanic membranes are intact bilaterally without bulging, retraction, inflammation or discharge. Hearing is grossly normal bilaterally. Nose:     External nasal examination shows no deformity or inflammation. Nasal  mucosa are pink and moist without lesions or exudates. Mouth:     Oral mucosa and oropharynx without lesions or exudates.  Teeth in good repair. Neck:     No deformities, masses, or tenderness noted. Chest Wall:     No deformities, masses, tenderness or gynecomastia noted. Breasts:     No masses or gynecomastia noted Lungs:     Normal respiratory effort, chest expands symmetrically. Lungs are clear to auscultation, no crackles or wheezes. Heart:     Normal rate and regular rhythm. S1 and S2 normal without gallop, murmur, click, rub or other extra sounds. Abdomen:     Bowel sounds positive,abdomen soft and non-tender without masses, organomegaly or hernias noted. Small scar superficially from prior melanoma removal...no defect or sclerotic tissue felt in area of discomfort. Rectal:     No external abnormalities noted. Normal sphincter tone. No rectal masses or tenderness. Genitalia:     Testes bilaterally descended without nodularity, tenderness or masses. No scrotal masses or lesions. No penis lesions or urethral discharge. Prostate:     Prostate gland firm and smooth, no enlargement, nodularity, tenderness, mass,  or induration. R pole slightly larger than L, 20gms. Msk:     No deformity or scoliosis noted of thoracic or lumbar spine.   Pulses:     R and L carotid,radial,femoral,dorsalis pedis and posterior tibial pulses are full and equal bilaterally Extremities:     No clubbing, cyanosis, edema, or deformity noted with normal full range of motion of all joints.   Neurologic:     No cranial nerve deficits noted. Station and gait are normal. Plantar reflexes are down-going bilaterally. DTRs are symmetrical throughout. Sensory, motor and coordinative functions appear intact. Skin:     Intact without suspicious lesions or rashes Cervical Nodes:     No lymphadenopathy noted Inguinal Nodes:     No significant adenopathy Psych:     Cognition and judgment appear intact. Alert and  cooperative with normal attention span and concentration. No apparent delusions, illusions, hallucinations    Impression & Recommendations:  Problem # 1:  ESSENTIAL HYPERTENSION (ICD-401.9) Assessment: Unchanged Stop Lisinopril due to cough and start Amlodipine. His updated medication list for this problem includes:    Amlodipine Besylate 5 Mg Tabs (Amlodipine besylate) ..... One tab by mouth at night.  BP today: 160/90 Prior BP: 130/80 (10/21/2008)  Labs Reviewed: Creat: 1.0 (11/24/2008) Chol: 146 (11/24/2008)   HDL: 27.1 (11/24/2008)   LDL: 102 (11/24/2008)   TG: 84 (11/24/2008)   Problem # 2:  HYPERLIPIDEMIA (ICD-272.4) Assessment: Unchanged Adequate, discussed. His updated medication list for this problem includes:    Simvastatin 40 Mg Tabs (Simvastatin) ..... One tab by mouth at night  Labs Reviewed: Chol: 146 (11/24/2008)   HDL: 27.1 (11/24/2008)   LDL: 102 (11/24/2008)   TG: 84 (11/24/2008) SGOT: 19 (11/24/2008)   SGPT: 18 (11/24/2008)   Problem # 3:  COLONIC POLYPS (ICD-211.3) Assessment: Unchanged Will direct to GI. Orders: Gastroenterology Referral (GI)   Problem # 4:  NEOP, MALIGNANT, SKIN, TRUNK MELANOMA (ICD-173.5) Assessment: Unchanged Do not feel defect .Marland Kitchen.he will see his surgeon after the first of the year.  Problem # 5:  SCREENING FOR MALIGNANT NEOPLASM, PROSTATE (ICD-V76.44) Assessment: Unchanged Stable PSA and exam.  Complete Medication List: 1)  Simvastatin 40 Mg Tabs (Simvastatin) .... One tab by mouth at night 2)  Vitamin E Blend 400 Unit Caps (Vitamin e) .Marland Kitchen.. 1 daily by mouth 3)  Bayer Aspirin 325 Mg Tabs (Aspirin) .... Takes as needed as needed 4)  Amlodipine Besylate 5 Mg Tabs (Amlodipine besylate) .... One tab by mouth at night. 5)  Lyrica 75 Mg Caps (Pregabalin) .... Takes as needed   Patient Instructions: 1)  Refer to GI. 2)  Needs CBC and iron lvl next visit. 3)  RTC 6 weeks, BP check.   Prescriptions: AMLODIPINE BESYLATE 5 MG  TABS (AMLODIPINE BESYLATE) one tab by mouth at night.  #30 x 12   Entered and Authorized by:   Shaune Leeks MD   Signed by:   Shaune Leeks MD on 12/01/2008   Method used:   Print then Give to Patient   RxID:   [id number]  ]

## 2011-01-18 NOTE — Letter (Signed)
Summary: [facility]  [facility]   Imported By: Lanelle Bal 10/30/2010 11:40:54  _____________________________________________________________________  External Attachment:    Type:   Image     Comment:   External Document

## 2011-01-18 NOTE — Letter (Signed)
Summary: Results Follow up Letter  [place] at [facility]  [address] [place], [place] [address]   Phone: [phone]  Fax: [phone]    12/27/2008 MRN: [id number]  [facility] [address] [place], [place]  [address]  Dear Eddie Fox,  The following are the results of your recent test(s):  Test         Result    Pap Smear:        Normal _____  Not Normal _____ Comments: ______________________________________________________ Cholesterol: LDL(Bad cholesterol):         Your goal is less than:         HDL (Good cholesterol):       Your goal is more than: Comments:  ______________________________________________________ Mammogram:        Normal _____  Not Normal _____ Comments:  ___________________________________________________________________ Hemoccult:        Normal _x____  Not normal _______ Comments:  No blood in stool. ThanK you for returning the hemoccult cards. Please make sure to repeat in one year.  _____________________________________________________________________ Other Tests:    We routinely do not discuss normal results over the telephone.  If you desire a copy of the results, or you have any questions about this information we can discuss them at your next office visit.   Sincerely,

## 2011-01-18 NOTE — Letter (Signed)
Summary: Dr.Todd Thana Farr  Dr.Todd Gerkin,[facility],Note   Imported By: Beau Fanny 11/02/2009 14:53:27  _____________________________________________________________________  External Attachment:    Type:   Image     Comment:   External Document

## 2011-01-18 NOTE — Letter (Signed)
Summary: Nadara Eaton letter  [place] at [facility]  [address] [place], [place] [address]   Phone: [phone]  Fax: [phone]       07/25/2010 MRN: [id number]  [facility] [address] [place], [place]  [address]  Dear Mr. Santaana,  [facility] [place] and [place] announce the retirement of Arta Silence, M.D., from full-time practice at the [facility] office effective June 15, 2010 and his plans of returning part-time.  It is important to Dr. Hetty Ely and to our practice that you understand that [facility] has seven physicians in our office for your health care needs.  We will continue to offer the same exceptional care that you have today.    Dr. Hetty Ely has spoken to many of you about his plans for retirement and returning part-time in the fall.   We will continue to work with you through the transition to schedule appointments for you in the office and meet the high standards that [facility] is committed to.   Again, it is with great pleasure that we share the news that Dr. Hetty Ely will return to [facility] in October of 2011 with a reduced schedule.    If you have any questions, or would like to request an appointment with one of our physicians, please call us at [phone] and press the option for Scheduling an appointment.  We take pleasure in providing you with excellent patient care and look forward to seeing you at your next office visit.  Our [facility] Physicians are:  Tillman Abide, M.D. Laurita Quint, M.D. Roxy Manns, M.D. Kerby Nora, M.D. Hannah Beat, M.D. Ruthe Mannan, M.D. We proudly welcomed Raechel Ache, M.D. and Eustaquio Boyden, M.D. to the practice in July/August 2011.  Sincerely,  [facility]

## 2011-01-18 NOTE — Consult Note (Signed)
Summary: [facility]/Dr. Channing Mutters   Imported By: Eleonore Chiquito 01/12/2008 14:27:00  _____________________________________________________________________  External Attachment:    Type:   Image     Comment:   External Document

## 2011-02-01 ENCOUNTER — Telehealth (INDEPENDENT_AMBULATORY_CARE_PROVIDER_SITE_OTHER): Payer: Self-pay | Admitting: *Deleted

## 2011-02-02 ENCOUNTER — Encounter (INDEPENDENT_AMBULATORY_CARE_PROVIDER_SITE_OTHER): Payer: Self-pay | Admitting: *Deleted

## 2011-02-02 ENCOUNTER — Encounter: Payer: Self-pay | Admitting: Gastroenterology

## 2011-02-02 ENCOUNTER — Ambulatory Visit (INDEPENDENT_AMBULATORY_CARE_PROVIDER_SITE_OTHER): Payer: Medicare Other | Admitting: Gastroenterology

## 2011-02-02 DIAGNOSIS — R1011 Right upper quadrant pain: Secondary | ICD-10-CM

## 2011-02-05 ENCOUNTER — Other Ambulatory Visit (INDEPENDENT_AMBULATORY_CARE_PROVIDER_SITE_OTHER): Payer: Medicare Other

## 2011-02-05 ENCOUNTER — Encounter (INDEPENDENT_AMBULATORY_CARE_PROVIDER_SITE_OTHER): Payer: Self-pay | Admitting: *Deleted

## 2011-02-05 ENCOUNTER — Other Ambulatory Visit: Payer: Self-pay | Admitting: Family Medicine

## 2011-02-05 DIAGNOSIS — Z125 Encounter for screening for malignant neoplasm of prostate: Secondary | ICD-10-CM

## 2011-02-05 DIAGNOSIS — I10 Essential (primary) hypertension: Secondary | ICD-10-CM

## 2011-02-05 DIAGNOSIS — E785 Hyperlipidemia, unspecified: Secondary | ICD-10-CM

## 2011-02-05 LAB — HEPATIC FUNCTION PANEL
ALT: 29 U/L (ref 0–53)
AST: 21 U/L (ref 0–37)
Albumin: 4.1 g/dL (ref 3.5–5.2)
Alkaline Phosphatase: 39 U/L (ref 39–117)
Bilirubin, Direct: 0.1 mg/dL (ref 0.0–0.3)
Total Bilirubin: 0.4 mg/dL (ref 0.3–1.2)
Total Protein: 6.7 g/dL (ref 6.0–8.3)

## 2011-02-05 LAB — BASIC METABOLIC PANEL
BUN: 12 mg/dL (ref 6–23)
CO2: 28 mEq/L (ref 19–32)
Calcium: 9 mg/dL (ref 8.4–10.5)
Chloride: 110 mEq/L (ref 96–112)
Creatinine, Ser: 1 mg/dL (ref 0.4–1.5)
GFR: 76.16 mL/min (ref >60.00)
Glucose, Bld: 90 mg/dL (ref 70–99)
Potassium: 4.8 mEq/L (ref 3.5–5.1)
Sodium: 142 mEq/L (ref 135–145)

## 2011-02-05 LAB — CBC WITH DIFFERENTIAL/PLATELET
Basophils Absolute: 0 10*3/uL (ref 0.0–0.1)
Basophils Relative: 0.5 % (ref 0.0–3.0)
Eosinophils Absolute: 0.4 10*3/uL (ref 0.0–0.7)
Eosinophils Relative: 6.8 % — ABNORMAL HIGH (ref 0.0–5.0)
HCT: 44.2 % (ref 39.0–52.0)
Hemoglobin: 15.1 g/dL (ref 13.0–17.0)
Lymphocytes Relative: 34.2 % (ref 12.0–46.0)
Lymphs Abs: 2 10*3/uL (ref 0.7–4.0)
MCHC: 34.2 g/dL (ref 30.0–36.0)
MCV: 95.5 fl (ref 78.0–100.0)
Monocytes Absolute: 0.4 10*3/uL (ref 0.1–1.0)
Monocytes Relative: 7.2 % (ref 3.0–12.0)
Neutro Abs: 3 10*3/uL (ref 1.4–7.7)
Neutrophils Relative %: 51.3 % (ref 43.0–77.0)
Platelets: 177 10*3/uL (ref 150.0–400.0)
RBC: 4.63 Mil/uL (ref 4.22–5.81)
RDW: 13.4 % (ref 11.5–14.6)
WBC: 5.9 10*3/uL (ref 4.5–10.5)

## 2011-02-05 LAB — LIPID PANEL
Cholesterol: 114 mg/dL (ref 0–200)
HDL: 31.5 mg/dL — ABNORMAL LOW (ref >39.00)
LDL Cholesterol: 59 mg/dL (ref 0–99)
Total CHOL/HDL Ratio: 4
Triglycerides: 120 mg/dL (ref 0.0–149.0)
VLDL: 24 mg/dL (ref 0.0–40.0)

## 2011-02-05 LAB — TSH: TSH: 3.2 u[IU]/mL (ref 0.35–5.50)

## 2011-02-05 LAB — PSA: PSA: 0.8 ng/mL (ref 0.10–4.00)

## 2011-02-07 NOTE — Letter (Signed)
Summary: EGD Instructions  [facility]  [address] [place], [place] [address]   Phone: [phone]  Fax: [phone]       Eddie Fox    [birth year]    MRN: [id number]       Procedure Day /Date:02/28/11 WED     Arrival Time: 2 pm     Procedure Time:3 pm     Location of Procedure:                    [facility] ([facility])   PREPARATION FOR ENDOSCOPY   On 02/28/11 THE DAY OF THE PROCEDURE:  1.   No solid foods, milk or milk products are allowed after midnight the night before your procedure.  2.   Do not drink anything colored red or purple.  Avoid juices with pulp.  No orange juice.  3.  You may drink clear liquids until1 pm , which is 2 hours before your procedure.                                                                                                CLEAR LIQUIDS INCLUDE: Water Jello Ice Popsicles Tea (sugar ok, no milk/cream) Powdered fruit flavored drinks Coffee (sugar ok, no milk/cream) Gatorade Juice: apple, white grape, white cranberry  Lemonade Clear bullion, consomm, broth Carbonated beverages (any kind) Strained chicken noodle soup Hard Candy   MEDICATION INSTRUCTIONS  Unless otherwise instructed, you should take regular prescription medications with a small sip of water as early as possible the morning of your procedure.               OTHER INSTRUCTIONS  You will need a responsible adult at least 69 years of age to accompany you and drive you home.   This person must remain in the waiting room during your procedure.  Wear loose fitting clothing that is easily removed.  Leave jewelry and other valuables at home.  However, you may wish to bring a book to read or an iPod/MP3 player to listen to music as you wait for your procedure to start.  Remove all body piercing jewelry and leave at home.  Total time from sign-in until discharge is approximately 2-3 hours.  You should go home directly after  your procedure and rest.  You can resume normal activities the day after your procedure.  The day of your procedure you should not:   Drive   Make legal decisions   Operate machinery   Drink alcohol   Return to work  You will receive specific instructions about eating, activities and medications before you leave.    The above instructions have been reviewed and explained to me by   _______________________    I fully understand and can verbalize these instructions _____________________________ Date _________

## 2011-02-07 NOTE — Progress Notes (Signed)
----   Converted from flag ---- ---- 02/01/2011 12:13 PM, Hannah Beat MD wrote: Prephysical Labs, several days before, fasting BMP, HFP, FLP, CBC with diff, TSH, PSA: 272.4, v77.1, ,780.79, v76.44   ---- 01/30/2011 10:47 AM, Liane Comber CMA (AAMA) wrote: Lab orders please! Good Morning! This pt is scheduled for cpx labs Monday, which labs to draw and dx codes to use? Thanks Tasha ------------------------------

## 2011-02-07 NOTE — Assessment & Plan Note (Signed)
History of Present Illness Visit Type: Initial Consult Primary GI MD: Rob Bunting MD Primary Provider: Hannah Beat, MD Requesting Provider: SpencerCopland, MD Chief Complaint: RUQ pain History of Present Illness:      A very pleasant 70 year old man who is here with his wife today. I saw him at the time of a routine, colonoscopy   needs reclal colonoscopy in 2015.  lap chole 2010, for galltones.  He had continued, constant RUQ pains at that time.  Those pains are gone, however he still has RUQ pains that are intermittent.  Can have a mild "twitch," at times.  If he does a lot of physcial activity the pains are worse.  Eating causes a lot of bloating, belching helps. Gas ex.  Overall he is intentionally losing weight.   recent liver tests Were normal.  No real GERD, pyrosis.  No nsaids but still one ASA a day for cardiac reasons.           Current Medications (verified): 1)  Simvastatin 40 Mg  Tabs (Simvastatin) .... One Tab By Mouth At Night 2)  Bayer Aspirin 325 Mg Tabs (Aspirin) .... Takes As Needed As Needed 3)  Amlodipine Besylate 5 Mg Tabs (Amlodipine Besylate) .... One Tab By Mouth Two Times A Day 4)  Vitamin D3 1000 Unit Tabs (Cholecalciferol) .... Take 2 Tablets Daily 5)  Magnesium Oxide 400 Mg Tabs (Magnesium Oxide) .... Take One Tab Daily 6)  Centrum Silver Ultra Mens  Tabs (Multiple Vitamins-Minerals) .... Take One Tablet Dialy  Allergies (verified): No Known Drug Allergies  Past History:  Past Medical History: Hyperlipidemia:03/1999  Hypertension , IBS, kidney stones, history of colon polyps , precancerous. Gallstone disease  Past Surgical History: TRAUMATIC AMPUTATION  DISTAL PHALANX OF R THUMB  FX R ANKLE ORIF 1967 BROKEN BACK  FELL OUT OF TREE STAND  1995 LAMINECTOMY L3-T11 [place] RODS IN BACK (RDR ROY)  1995 DISLOCATED LEFT SHOULDER (DR WHITFIELD) 1995 MELANOMA EXCISION R UPPER ABD (DR JONES) 1995 BCC R UPPER ARM Nov 03, 1994 COLONOSCOPY   POLYPECTOMY (DR [facility]) 02/1997 COLONOSCOPY--NO POLYPS (DR. WISEMANN):(06/2002)----REPEAT IN 5 YEARS BACK INJECTION  09/2002 COLONOSCOPY  MILD DIVERTICS INT AND EXT HEMMS O/W NML NO POLYPS (DR Troyce Febo) 03/04/2009,  recall at five-year interval ABD U/S GALLSTONES NO EVID CHOLEYCYSTITIS FATTY LIVER L KIDNEY STONE NO VENTR HERNIA 08/12/09 LAP CHOLEY (DR GERKIN) 10/03/2009  Family History: FatherDECEASED 91 YOA CHF, HTN:  Mother: DECEASED 27 YOA (11/03/2001) CHF HTN, DM, STROKE Brother dec 63 Trauma to head from fall  2 SISTERS BOTH  WITH HTN CV: PATERNAL SIDE HBP: +FATHER, + MOTHER, +SISTERS DM: + MOTHER GOUT/ARTHRITIS:  PROSTATE CANCER: MELANOMA SELF BREAST/OVARIAN/UTERINE CANCER? 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  All other review of systems was otherwise negative.   Vital Signs:  Patient profile:   70 year old male Height:      69 inches Weight:      203 pounds BMI:     30.09 Pulse rate:   76 / minute Pulse rhythm:   regular BP sitting:   134 / 76  (left arm) Cuff size:   regular  Vitals Entered By: June McMurray CMA (AAMA) (  February 02, 2011 3:13 PM)  Physical Exam  Additional Exam:  Constitutional: generally well appearing Psychiatric: alert and oriented times 3 Eyes: extraocular movements intact Mouth: oropharynx moist, no lesions Neck: supple, no lymphadenopathy Cardiovascular: heart regular rate and rythm Lungs: CTA bilaterally Abdomen: soft, non-tender, non-distended, no obvious ascites, no peritoneal signs, normal bowel sounds,  midline hernia without tenderness Extremities: no lower extremity edema bilaterally Skin: no lesions on  visible extremities    Impression & Recommendations:  Problem # 1:   mild right upper quadrant discomfort  gallbladder was removed over a year ago and his liver tests are normal now. I think it is unlikely he has retained bile duct stone. This seems most likely to be adhesive related however I think we should proceed with EGD at his soonest convenience to check for gastritis, peptic ulcer disease, perhaps esophagitis. He does take aspirin unopposed. Marland Kitchen He will try proton pump inhibitor once daily for now.  Patient Instructions: 1)  You will be scheduled to have an upper endoscopy. 2)  Trial of PPI (nexium), take one pill once daily. 3)  A copy of this information will be sent to Dr. Dallas Schimke. 4)  The medication list was reviewed and reconciled.  All changed / newly prescribed medications were explained.  A complete medication list was provided to the patient / caregiver.  Appended Document: Orders Update/EGD    Clinical Lists Changes  Orders: Added new Test order of EGD (EGD) - Signed      Appended Document:  nexium samples given

## 2011-02-14 ENCOUNTER — Encounter: Payer: Self-pay | Admitting: Family Medicine

## 2011-02-14 ENCOUNTER — Encounter (INDEPENDENT_AMBULATORY_CARE_PROVIDER_SITE_OTHER): Payer: Medicare Other | Admitting: Family Medicine

## 2011-02-14 DIAGNOSIS — H00029 Hordeolum internum unspecified eye, unspecified eyelid: Secondary | ICD-10-CM | POA: Insufficient documentation

## 2011-02-14 DIAGNOSIS — E785 Hyperlipidemia, unspecified: Secondary | ICD-10-CM

## 2011-02-14 DIAGNOSIS — I10 Essential (primary) hypertension: Secondary | ICD-10-CM

## 2011-02-14 DIAGNOSIS — Z23 Encounter for immunization: Secondary | ICD-10-CM

## 2011-02-14 DIAGNOSIS — F528 Other sexual dysfunction not due to a substance or known physiological condition: Secondary | ICD-10-CM

## 2011-02-14 DIAGNOSIS — Z0000 Encounter for general adult medical examination without abnormal findings: Secondary | ICD-10-CM

## 2011-02-15 ENCOUNTER — Telehealth (INDEPENDENT_AMBULATORY_CARE_PROVIDER_SITE_OTHER): Payer: Self-pay | Admitting: *Deleted

## 2011-02-16 ENCOUNTER — Ambulatory Visit (INDEPENDENT_AMBULATORY_CARE_PROVIDER_SITE_OTHER): Payer: Medicare Other | Admitting: Family Medicine

## 2011-02-16 ENCOUNTER — Encounter: Payer: Self-pay | Admitting: Family Medicine

## 2011-02-16 DIAGNOSIS — T8062XA Other serum reaction due to vaccination, initial encounter: Secondary | ICD-10-CM

## 2011-02-22 NOTE — Letter (Signed)
Summary: Nature conservation officer [employer] Wellness Visit Questionnaire   [employer] Annual Wellness Visit Questionnaire   Imported By: Beau Fanny 02/16/2011 11:20:15  _____________________________________________________________________  External Attachment:    Type:   Image     Comment:   External Document

## 2011-02-22 NOTE — Assessment & Plan Note (Signed)
Summary: CHECK RIGHT ARM,?REACTION TO PNEUMONIA SHOT/CLE  [employer] SECURE H...   Vital Signs:  Patient profile:   70 year old male Weight:      206 pounds Temp:     98.5 degrees F oral Pulse rate:   64 / minute Pulse rhythm:   regular BP sitting:   126 / 76  (left arm) Cuff size:   large  Vitals Entered By: Selena Batten Dance CMA Duncan Dull) (February 16, 2011 4:32 PM) CC: Reaction to pneumovax   History of Present Illness: CC: reaction to pneumonia shot.    2 days ago had PNA shot.  Started swelling and turning red next morning.  Getting worse.  Swelling spreading as well as redness down arm.  Sore deltoid, not elsewhere.  + itching some  Using ice to treat.  No reactions to injections in past.  Has taken teatnus shot, has had flu shot in past without problems.  no swellign in mouth, throat, other rash, fever, sob, dizziness, nausea/abd pain.  Also with stye L eye, using medicine to treat.  will start hot compress tonight.  Allergies (verified): 1)  ! * Pneumovax  Past History:  Past Medical History: Last updated: 02/14/2011 Hyperlipidemia:03/1999 Hypertension IBS kidney stones history of colon polyps , precancerous.   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  delineated Neurologic:  sensation intact   Impression & Recommendations:  Problem # 1:  OTHER SERUM REACTION DUE TO VACCINATION (ICD-999.52) allergic/inflammatory reaction to pna shot.  (first and only pneumovax given).  rec elevation, ice, NSAIDs.  if redness/warmth spreading proximally, fever, or  other concern, to start abx.  o/w monitor for now.   expect redness to spread distally (due to gravity).  cannot rule out infection currently.   Complete Medication List: 1)  Simvastatin 40 Mg Tabs (Simvastatin) .... One tab by mouth at night 2)  Bayer Aspirin 325 Mg Tabs (Aspirin) .... Takes as needed as needed 3)  Amlodipine Besylate 5 Mg Tabs (Amlodipine besylate) .... One tab by mouth two times a day 4)  Vitamin D3 1000 Unit Tabs (Cholecalciferol) .... Take 2 tablets daily 5)  Magnesium Oxide 400 Mg Tabs (Magnesium oxide) .... Take one tab daily 6)  Centrum Silver Ultra Mens Tabs (Multiple vitamins-minerals) .... Take one tablet dialy 7)  Nexium 40 Mg Cpdr (Esomeprazole magnesium) .... Take one tablet daily 8)  Levitra 20 Mg Tabs (Vardenafil hcl) .... 1/2 to 1 tab by mouth 30 mins before intercourse 9)  Doxycycline Hyclate 100 Mg Caps (Doxycycline hyclate) .... One twice daily for 10 days  Patient Instructions: 1)  looks like allergic reaction.  keep arm elevated above heart. 2)  Ibuprofen for swelling, ice to arm no more than 15 min at a time. 3)  antibiotics to hold on to in case redness spreading or not improving with ibuprofen and ice and elevation of extremity. 4)  If not better, please return to be seen next week. Prescriptions: DOXYCYCLINE HYCLATE 100 MG CAPS (DOXYCYCLINE HYCLATE) one twice daily for 10 days  #20 x 0   Entered and Authorized by:   Eustaquio Boyden  MD   Signed by:   Eustaquio Boyden  MD on 02/16/2011   Method used:   Print then Give to Patient   RxID:   [id number]    Orders Added: 1)  Est. Patient Level III [84132]    Current Allergies (reviewed today): ! * PNEUMOVAX

## 2011-02-22 NOTE — Assessment & Plan Note (Signed)
Summary: cpe [facility] per Dr Tesean Stump/ Dr Hetty Ely pt   Vital Signs:  Patient profile:   70 year old male Height:      69 inches Weight:      203.50 pounds BMI:     30.16 Temp:     97.9 degrees F oral Pulse rate:   76 / minute Pulse rhythm:   regular BP sitting:   130 / 82  (left arm) Cuff size:   regular  Vitals Entered By: Benny Lennert CMA Duncan Dull) (February 14, 2011 2:30 PM)  Vision Screening:Left eye w/o correction: 20 / 25 Right Eye w/o correction: 20 / 400 Both eyes w/o correction:  20/ 25  Color vision testing: normal      Vision Entered By: Benny Lennert CMA Duncan Dull) (February 14, 2011 2:32 PM)  Hearing Screen 40db HL: Left  500 hz: 40db 1000 hz: No Response 2000 hz: No Response 4000 hz: No Response Right  500 hz: 40db 1000 hz: No Response 2000 hz: No Response 4000 hz: No Response    History of Present Illness: 70 year old:  Medicare CPX:  Recently saw Dr. Christella Hartigan.   Stye: Has a stye right now. L eye  Decreased vision Decreased hearing  ED: samples in the past have worked for him, did not fill viagra due to cost  HTN: stable, no c/o se  Lipids: doing well, reviewed panel.  I have personally reviewed the Medicare Annual Wellness questionnaire and have noted 1.   The patient's medical and social history 2.   Their use of alcohol, tobacco or illicit drugs 3.   Their current medications and supplements 4.   The patient's functional ability including ADL's, fall risks, home safety risks and hearing or visual             impairment. 5.   Diet and physical activities 6.   Evidence for depression or mood disorders  The patients weight, height, BMI and visual acuity have been recorded in the chart I have made referrals, counseling and provided education to the patient based review of the above and I have provided the pt with a written personalized care plan for preventive services.   I have provided you with a copy of your personalized plan for  preventive services. Please take the time to review along with your updated medication list.   Preventive Screening-Counseling & Management  Alcohol-Tobacco     Alcohol drinks/day: 0     Smoking Status: never     Passive Smoke Exposure: no  Caffeine-Diet-Exercise     Caffeine use/day: 2     Does Patient Exercise: no  Hep-HIV-STD-Contraception     HIV Risk: no     STD Risk: no risk noted  Safety-Violence-Falls     Seat Belt Use: 100      Drug Use:  no.    Clinical Review Panels:  Prevention   Last Colonoscopy:  Location:  [facility].  (03/04/2009)   Last PSA:  0.80 (02/05/2011)  Immunizations   Last Tetanus Booster:  Td (12/22/2009)   Last Pneumovax:  Pneumovax (Medicare) (02/14/2011)  Lipid Management   Cholesterol:  114 (02/05/2011)   LDL (bad choesterol):  59 (02/05/2011)   HDL (good cholesterol):  31.50 (02/05/2011)  Diabetes Management   Creatinine:  1.0 (02/05/2011)   Last Pneumovax:  Pneumovax (Medicare) (02/14/2011)  CBC   WBC:  5.9 (02/05/2011)   RBC:  4.63 (02/05/2011)   Hgb:  15.1 (02/05/2011)   Hct:  44.2 (02/05/2011)  Platelets:  177.0 (02/05/2011)   MCV  95.5 (02/05/2011)   MCHC  34.2 (02/05/2011)   RDW  13.4 (02/05/2011)   PMN:  51.3 (02/05/2011)   Lymphs:  34.2 (02/05/2011)   Monos:  7.2 (02/05/2011)   Eosinophils:  6.8 (02/05/2011)   Basophil:  0.5 (02/05/2011)  Complete Metabolic Panel   Glucose:  90 (02/05/2011)   Sodium:  142 (02/05/2011)   Potassium:  4.8 (02/05/2011)   Chloride:  110 (02/05/2011)   CO2:  28 (02/05/2011)   BUN:  12 (02/05/2011)   Creatinine:  1.0 (02/05/2011)   Albumin:  4.1 (02/05/2011)   Total Protein:  6.7 (02/05/2011)   Calcium:  9.0 (02/05/2011)   Total Bili:  0.4 (02/05/2011)   Alk Phos:  39 (02/05/2011)   SGPT (ALT):  29 (02/05/2011)   SGOT (AST):  21 (02/05/2011)   Allergies (verified): No Known Drug Allergies  Past History:  Past medical, surgical, family and social histories  (including risk factors) reviewed, and no changes noted (except as noted below).  Past Medical History: Hyperlipidemia:03/1999 Hypertension IBS kidney stones history of colon polyps , precancerous.   Past Surgical History: TRAUMATIC AMPUTATION  DISTAL PHALANX OF R THUMB  FX R ANKLE ORIF 1967 BROKEN BACK  FELL OUT OF TREE STAND  1995 LAMINECTOMY L3-T11 [place] RODS IN BACK (RDR ROY)  1995 DISLOCATED LEFT SHOULDER (DR WHITFIELD) 1995 MELANOMA EXCISION R UPPER ABD (DR JONES) 1995 BCC R UPPER ARM 10-30-1994 LAP CHOLEY (DR GERKIN) 10/03/2009  Family History: Reviewed history from 02/02/2011 and no changes required. FatherDECEASED 43 YOA CHF, HTN:  Mother: DECEASED 24 YOA (2001/10/30) CHF HTN, DM, STROKE Brother dec 63 Trauma to head from fall  2 SISTERS BOTH  WITH HTN CV: PATERNAL SIDE HBP: +FATHER, + MOTHER, +SISTERS DM: + MOTHER GOUT/ARTHRITIS:  PROSTATE CANCER: MELANOMA SELF BREAST/OVARIAN/UTERINE CANCER? ?MGM DEPRESSION: NEGATIVE ETOH/DRUG ABUSE: NEGATIVE OTHER: STROKE + MOTHER    Social History: Reviewed history from 02/02/2011 and no changes required. 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 AS ABOVE Cardiovascular: Denies chest pains, palpitations, syncope, dyspnea on exertion,  Respiratory: Denies cough, dyspnea at rest, excessive sputum,wheeezing GI: Denies nausea, vomiting, diarrhea, constipation, change in bowel habits, abdominal pain, melena, hematochezia GU: ED AS ABOVE Musculoskeletal: Denies back pain, joint pain Derm: Denies rash, itching Neuro: Denies  paresthesias, frequent falls, frequent headaches, and difficulty walking.  Psych: Denies depression,  anxiety Endocrine: Denies cold intolerance, heat intolerance, polydipsia, polyphagia, polyuria, and unusual weight change.  Heme: Denies enlarged lymph nodes Allergy: No hayfever    Impression & Recommendations:  Problem # 1:  HEALTH MAINTENANCE EXAM (ICD-V70.0)  The patient's preventative maintenance and recommended screening tests for an annual wellness exam were reviewed in full today. Brought up to date unless services declined.  Counselled on the importance of diet, exercise, and its role in overall health and mortality. The patient's FH and SH was reviewed, including their home life, tobacco status, and drug and alcohol status.   Orders: Medicare -1st Annual Wellness Visit [phone])  Problem # 2:  ESSENTIAL HYPERTENSION (ICD-401.9) Assessment: Unchanged  His updated medication list for this problem includes:    Amlodipine Besylate 5 Mg Tabs (Amlodipine besylate) ..... One tab by mouth two times a day  Problem # 3:  HYPERLIPIDEMIA (ICD-272.4) Assessment: Unchanged  His updated medication list for this problem includes:    Simvastatin 40 Mg Tabs (Simvastatin) ..... 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 1/2 to 1 tab by mouth 30 mins before intercourse  Problem # 5:  HORDEOLUM, INTERNAL (ICD-373.12) Assessment: New reviewed stye care  Complete Medication List: 1)  Simvastatin 40 Mg Tabs (Simvastatin) .... One tab by mouth at night 2)  Bayer Aspirin 325 Mg Tabs (Aspirin) .... Takes as needed as needed 3)  Amlodipine Besylate 5 Mg Tabs (Amlodipine besylate) .... One tab by mouth two times a day 4)  Vitamin D3 1000 Unit Tabs (Cholecalciferol) .... Take 2 tablets daily 5)  Magnesium Oxide 400 Mg Tabs (Magnesium oxide) .... Take one tab daily 6)  Centrum Silver Ultra Mens Tabs  (Multiple vitamins-minerals) .... Take one tablet dialy 7)  Nexium 40 Mg Cpdr (Esomeprazole magnesium) .... Take one tablet daily 8)  Levitra 20 Mg Tabs (Vardenafil hcl) .... 1/2 to 1 tab by mouth 30 mins before intercourse  Other Orders: Pneumococcal Vaccine ([address]) Admin 1st Vaccine (60454)  Patient Instructions: 1)  f/u 1 year Prescriptions: LEVITRA 20 MG TABS (VARDENAFIL HCL) 1/2 to 1 tab by mouth 30 mins before intercourse  #5 x 11   Entered and Authorized by:   Hannah Beat MD   Signed by:   Hannah Beat MD on 02/14/2011   Method used:   Print then Give to Patient   RxID:   [id number]    Orders Added: 1)  Pneumococcal Vaccine [30865] 2)  Admin 1st Vaccine [90471] 3)  Medicare -1st Annual Wellness Visit [G0438] 4)  Est. Patient Level IV [78469]   Immunizations Administered:  Pneumonia Vaccine:    Vaccine Type: Pneumovax (Medicare)    Site: right deltoid    Mfr: Merck    Dose: 0.5 ml    Route: [place]    Given by: Benny Lennert CMA (AAMA)    Exp. Date: 07/12/2012    Lot #: [id number]    VIS given: 11/21/09 version given February 14, 2011.   Immunizations Administered:  Pneumonia Vaccine:    Vaccine Type: Pneumovax (Medicare)    Site: right deltoid    Mfr: Merck    Dose: 0.5 ml    Route: [place]    Given by: Benny Lennert CMA (AAMA)    Exp. Date: 07/12/2012    Lot #: [id number]    VIS given: 11/21/09 version given February 14, 2011.  Prevention & Chronic Care Immunizations   Influenza vaccine: Not documented   Influenza vaccine deferral: Refused  (02/14/2011)    Tetanus booster: 12/22/2009: Td   Tetanus booster due: 03/24/2012    Pneumococcal vaccine: Pneumovax (Medicare)  (02/14/2011)   Pneumococcal vaccine deferral: Refused  (02/14/2011)    H. zoster vaccine: Not documented   H. zoster vaccine deferral: Refused  (02/14/2011)  Colorectal Screening   Hemoccult: negative  (12/24/2008)   Hemoccult due: 08/09/2006    Colonoscopy: Location:  [facility].    (03/04/2009)   Colonoscopy due: 03/2014  Other Screening   PSA: 0.80  (02/05/2011)   PSA due due: 07/07/2008   Smoking status: never  (02/14/2011)  Lipids   Total Cholesterol: 114  (02/05/2011)   LDL: 59  (02/05/2011)   LDL  Direct: 171.9  (07/08/2007)   HDL: 31.50  (02/05/2011)   Triglycerides: 120.0  (02/05/2011)    SGOT (AST): 21  (02/05/2011)   SGPT (ALT): 29  (02/05/2011)   Alkaline phosphatase: 39  (02/05/2011)   Total bilirubin: 0.4  (02/05/2011)    Lipid flowsheet reviewed?: Yes   Progress toward LDL goal: At goal  Hypertension   Last Blood Pressure: 130 / 82  (02/14/2011)   Serum creatinine: 1.0  (02/05/2011)   Serum potassium 4.8  (02/05/2011)    Hypertension flowsheet reviewed?: Yes   Progress toward BP goal: At goal  Self-Management Support :    Hypertension self-management support: Not documented    Lipid self-management support: Not documented    Current Allergies (reviewed today): No known allergies    Physical Exam General Appearance: well developed, well nourished, no acute distress Eyes: conjunctiva and lids normal, PERRLA, EOMI Ears, Nose, Mouth, Throat: TM clear, nares clear, oral exam WNL Neck: supple, no lymphadenopathy, no thyromegaly, no JVD Respiratory: clear to auscultation and percussion, respiratory effort normal Cardiovascular: regular rate and rhythm, S1-S2, no murmur, rub or gallop, no bruits, peripheral pulses normal and symmetric, no cyanosis, clubbing, edema or varicosities Chest: no scars, masses, tenderness; no asymmetry, skin changes, nipple discharge, no gynecomastia   Gastrointestinal: soft, non-tender; no hepatosplenomegaly, masses; active bowel sounds all quadrants,  no masses, tenderness, hemorrhoids  Genitourinary: no hernia, testicular mass, penile discharge, priapism or prostate enlargement Lymphatic: no cervical, axillary or inguinal adenopathy Musculoskeletal: gait normal, muscle tone and  strength WNL, no joint swelling, effusions, discoloration, crepitus  Skin: clear, good turgor, color WNL, no rashes, lesions, or ulcerations Neurologic: normal mental status, normal reflexes, normal strength, sensation, and motion Psychiatric: alert; oriented to person, place and time Other Exam:

## 2011-02-22 NOTE — Progress Notes (Signed)
Summary: reaction to pneumovax  Phone Note Call from Patient   Caller: Spouse Mae Call For: Hannah Beat MD Summary of Call: Pt got a pneumovax yesterday.  Arm is red, swollen, hurting.  Advised his wife that it's not unusual to get a reaction at the injection site.  Wife is asking if you want to see his arm, or just treat at home.  Please advise.           Lowella Petties CMA, AAMA  February 15, 2011 4:33 PM   Follow-up for Phone Call        Ice, Tylenol. Ok to watch at home, if it gets really red and spreading, we should probably check, but you can sometimes get a small reaction - most do fine. Shoulder will prob hurt through the weekend. Follow-up by: Hannah Beat MD,  February 15, 2011 4:54 PM  Additional Follow-up for Phone Call Additional follow up Details #1::        Patient will call round lunch time if not better then will schedule appt with  someone to be seen.Consuello Masse CMA   Additional Follow-up by: Benny Lennert CMA (AAMA),  February 16, 2011 8:00 AM

## 2011-02-26 ENCOUNTER — Other Ambulatory Visit: Payer: Self-pay | Admitting: Dermatology

## 2011-02-28 ENCOUNTER — Other Ambulatory Visit: Payer: Self-pay | Admitting: Gastroenterology

## 2011-02-28 ENCOUNTER — Other Ambulatory Visit (AMBULATORY_SURGERY_CENTER): Payer: Medicare Other | Admitting: Gastroenterology

## 2011-02-28 ENCOUNTER — Other Ambulatory Visit: Payer: Medicare Other | Admitting: Gastroenterology

## 2011-02-28 DIAGNOSIS — A048 Other specified bacterial intestinal infections: Secondary | ICD-10-CM

## 2011-02-28 DIAGNOSIS — K2990 Gastroduodenitis, unspecified, without bleeding: Secondary | ICD-10-CM

## 2011-02-28 DIAGNOSIS — R1011 Right upper quadrant pain: Secondary | ICD-10-CM

## 2011-02-28 DIAGNOSIS — K2970 Gastritis, unspecified, without bleeding: Secondary | ICD-10-CM

## 2011-02-28 DIAGNOSIS — K449 Diaphragmatic hernia without obstruction or gangrene: Secondary | ICD-10-CM

## 2011-02-28 DIAGNOSIS — K2940 Chronic atrophic gastritis without bleeding: Secondary | ICD-10-CM

## 2011-03-06 ENCOUNTER — Encounter: Payer: Self-pay | Admitting: Gastroenterology

## 2011-03-06 NOTE — Procedures (Addendum)
Summary: Upper Endoscopy  Patient: Eddie Fox Note: All result statuses are Final unless otherwise noted.  Tests: (1) Upper Endoscopy (EGD)   EGD Upper Endoscopy       DONE     [facility]     [address]     [place], [place]  [address]          ENDOSCOPY PROCEDURE REPORT          PATIENT:  Eddie Fox, Eddie Fox  MR#:  [id number]     BIRTHDATE:  January 19, 1941, 69 yrs. old  GENDER:  male     ENDOSCOPIST:  Rachael Fee, MD     Referred by:  Elpidio Galea. Copland, M.D.     PROCEDURE DATE:  02/28/2011     PROCEDURE:  EGD with biopsy, 43239     ASA CLASS:  Class II     INDICATIONS:  RUQ pain, lap chole 1 year ago, normal lfts now.     MEDICATIONS:  Fentanyl 50 mcg IV, Versed 5 mg IV     TOPICAL ANESTHETIC:  Exactacain Spray          DESCRIPTION OF PROCEDURE:   After the risks benefits and     alternatives of the procedure were thoroughly explained, informed     consent was obtained.  The LB GIF-H180 [id number] endoscope was     introduced through the mouth and advanced to the second portion of     the duodenum, without limitations.  The instrument was slowly     withdrawn as the mucosa was fully examined.     <<PROCEDUREIMAGES>>     There was moderate, non-specific gastritis. Biopsies taken and     sent to pathology (jar 1) (see image4).  A hiatal hernia was     found. This was 1-2cm (see image5 and image6).  Otherwise the     examination was normal (see image3 and image2).    Retroflexed     views revealed no abnormalities.    The scope was then withdrawn     from the patient and the procedure completed.     COMPLICATIONS:  None          ENDOSCOPIC IMPRESSION:     1) Moderate gastritis, biopsied to check for H. pylori     2) Small hiatal hernia     3) Otherwise normal examination          RECOMMENDATIONS:     If biopsies show H. pylori, you will be started on appropriate     antibiotics.     It is not clear that your pains are GI related.       ______________________________     Rachael Fee, MD          n.     eSIGNED:   Rachael Fee at 02/28/2011 02:14 PM          Elayne Guerin, [id number]  Note: An exclamation mark (!) indicates a result that was not dispersed into the flowsheet. Document Creation Date: 02/28/2011 2:14 PM _______________________________________________________________________  (1) Order result status: Final Collection or observation date-time: 02/28/2011 14:05 Requested date-time:  Receipt date-time:  Reported date-time:  Referring Physician:   Ordering Physician: Rob Bunting [phone]) Specimen Source:  Source: Launa Grill Order Number: [id number] Lab site:

## 2011-03-15 NOTE — Letter (Signed)
Summary: Results Letter  [facility]  [address] [place], [place] [address]   Phone: [phone]  Fax: [phone]        March 06, 2011 MRN: [id number]    [facility] [address] [place], [place]  [address]    Dear Mr. Stepp,    The biopsies taken during your recent procedure showed no infection or cancer.  You should continue to follow the recommendations that we discussed at the time of your procedure.  Please feel free to call if you have any further questions or concerns.      Sincerely,  Rachael Fee MD  This letter has been electronically signed by your physician.  Appended Document: Results Letter letter mailed

## 2011-03-22 LAB — COMPREHENSIVE METABOLIC PANEL
ALT: 30 U/L (ref 0–53)
AST: 21 U/L (ref 0–37)
Albumin: 4.2 g/dL (ref 3.5–5.2)
Alkaline Phosphatase: 41 U/L (ref 39–117)
BUN: 15 mg/dL (ref 6–23)
CO2: 27 mEq/L (ref 19–32)
Calcium: 9.6 mg/dL (ref 8.4–10.5)
Chloride: 108 mEq/L (ref 96–112)
Creatinine, Ser: 1.04 mg/dL (ref 0.4–1.5)
GFR calc Af Amer: >60 mL/min (ref >60)
GFR calc non Af Amer: >60 mL/min (ref >60)
Glucose, Bld: 103 mg/dL — ABNORMAL HIGH (ref 70–99)
Potassium: 4.5 mEq/L (ref 3.5–5.1)
Sodium: 142 mEq/L (ref 135–145)
Total Bilirubin: 0.7 mg/dL (ref 0.3–1.2)
Total Protein: 7 g/dL (ref 6.0–8.3)

## 2011-03-22 LAB — URINALYSIS, ROUTINE W REFLEX MICROSCOPIC
Bilirubin Urine: NEGATIVE
Glucose, UA: NEGATIVE mg/dL
Hgb urine dipstick: NEGATIVE
Ketones, ur: NEGATIVE mg/dL
Nitrite: NEGATIVE
Protein, ur: NEGATIVE mg/dL
Specific Gravity, Urine: 1.021 (ref 1.005–1.030)
Urobilinogen, UA: 0.2 mg/dL (ref 0.0–1.0)
pH: 6 (ref 5.0–8.0)

## 2011-03-22 LAB — PROTIME-INR
INR: 1.01 (ref 0.00–1.49)
Prothrombin Time: 13.2 seconds (ref 11.6–15.2)

## 2011-03-22 LAB — DIFFERENTIAL
Basophils Absolute: 0 10*3/uL (ref 0.0–0.1)
Basophils Relative: 0 % (ref 0–1)
Eosinophils Absolute: 0.2 10*3/uL (ref 0.0–0.7)
Eosinophils Relative: 4 % (ref 0–5)
Lymphocytes Relative: 24 % (ref 12–46)
Lymphs Abs: 1.5 10*3/uL (ref 0.7–4.0)
Monocytes Absolute: 0.5 10*3/uL (ref 0.1–1.0)
Monocytes Relative: 8 % (ref 3–12)
Neutro Abs: 3.9 10*3/uL (ref 1.7–7.7)
Neutrophils Relative %: 64 % (ref 43–77)

## 2011-03-22 LAB — CBC
HCT: 42 % (ref 39.0–52.0)
Hemoglobin: 14.5 g/dL (ref 13.0–17.0)
MCHC: 34.5 g/dL (ref 30.0–36.0)
MCV: 93.9 fL (ref 78.0–100.0)
Platelets: 201 10*3/uL (ref 150–400)
RBC: 4.48 MIL/uL (ref 4.22–5.81)
RDW: 12.8 % (ref 11.5–15.5)
WBC: 6.1 10*3/uL (ref 4.0–10.5)

## 2011-12-25 ENCOUNTER — Ambulatory Visit (INDEPENDENT_AMBULATORY_CARE_PROVIDER_SITE_OTHER): Payer: Medicare Other | Admitting: *Deleted

## 2011-12-25 DIAGNOSIS — Z23 Encounter for immunization: Secondary | ICD-10-CM

## 2012-02-13 ENCOUNTER — Other Ambulatory Visit: Payer: Self-pay | Admitting: Family Medicine

## 2012-05-08 ENCOUNTER — Other Ambulatory Visit (INDEPENDENT_AMBULATORY_CARE_PROVIDER_SITE_OTHER): Payer: Medicare Other

## 2012-05-08 DIAGNOSIS — E785 Hyperlipidemia, unspecified: Secondary | ICD-10-CM

## 2012-05-08 DIAGNOSIS — Z125 Encounter for screening for malignant neoplasm of prostate: Secondary | ICD-10-CM

## 2012-05-08 DIAGNOSIS — Z79899 Other long term (current) drug therapy: Secondary | ICD-10-CM

## 2012-05-08 LAB — LIPID PANEL
Cholesterol: 103 mg/dL (ref 0–200)
HDL: 31.8 mg/dL — ABNORMAL LOW (ref >39.00)
LDL Cholesterol: 58 mg/dL (ref 0–99)
Total CHOL/HDL Ratio: 3
Triglycerides: 66 mg/dL (ref 0.0–149.0)
VLDL: 13.2 mg/dL (ref 0.0–40.0)

## 2012-05-08 LAB — CBC WITH DIFFERENTIAL/PLATELET
Basophils Absolute: 0 10*3/uL (ref 0.0–0.1)
Basophils Relative: 0.6 % (ref 0.0–3.0)
Eosinophils Absolute: 0.7 10*3/uL (ref 0.0–0.7)
Eosinophils Relative: 11.9 % — ABNORMAL HIGH (ref 0.0–5.0)
HCT: 44.2 % (ref 39.0–52.0)
Hemoglobin: 14.6 g/dL (ref 13.0–17.0)
Lymphocytes Relative: 30.5 % (ref 12.0–46.0)
Lymphs Abs: 1.7 10*3/uL (ref 0.7–4.0)
MCHC: 32.9 g/dL (ref 30.0–36.0)
MCV: 96.6 fl (ref 78.0–100.0)
Monocytes Absolute: 0.5 10*3/uL (ref 0.1–1.0)
Monocytes Relative: 8.5 % (ref 3.0–12.0)
Neutro Abs: 2.7 10*3/uL (ref 1.4–7.7)
Neutrophils Relative %: 48.5 % (ref 43.0–77.0)
Platelets: 161 10*3/uL (ref 150.0–400.0)
RBC: 4.58 Mil/uL (ref 4.22–5.81)
RDW: 13.9 % (ref 11.5–14.6)
WBC: 5.5 10*3/uL (ref 4.5–10.5)

## 2012-05-08 LAB — HEPATIC FUNCTION PANEL
ALT: 25 U/L (ref 0–53)
AST: 22 U/L (ref 0–37)
Albumin: 4.2 g/dL (ref 3.5–5.2)
Alkaline Phosphatase: 33 U/L — ABNORMAL LOW (ref 39–117)
Bilirubin, Direct: 0.1 mg/dL (ref 0.0–0.3)
Total Bilirubin: 0.7 mg/dL (ref 0.3–1.2)
Total Protein: 6.9 g/dL (ref 6.0–8.3)

## 2012-05-08 LAB — BASIC METABOLIC PANEL
BUN: 23 mg/dL (ref 6–23)
CO2: 25 mEq/L (ref 19–32)
Calcium: 9.4 mg/dL (ref 8.4–10.5)
Chloride: 108 mEq/L (ref 96–112)
Creatinine, Ser: 1 mg/dL (ref 0.4–1.5)
GFR: 79.43 mL/min (ref >60.00)
Glucose, Bld: 86 mg/dL (ref 70–99)
Potassium: 4.5 mEq/L (ref 3.5–5.1)
Sodium: 142 mEq/L (ref 135–145)

## 2012-05-09 LAB — PSA, MEDICARE: PSA: 1.43 ng/mL (ref <=4.00)

## 2012-05-14 ENCOUNTER — Encounter: Payer: Self-pay | Admitting: Family Medicine

## 2012-05-15 ENCOUNTER — Encounter: Payer: Self-pay | Admitting: Family Medicine

## 2012-05-15 ENCOUNTER — Ambulatory Visit (INDEPENDENT_AMBULATORY_CARE_PROVIDER_SITE_OTHER): Payer: Medicare Other | Admitting: Family Medicine

## 2012-05-15 VITALS — BP 118/70 | HR 60 | Temp 98.8°F | Ht 69.0 in | Wt 199.0 lb

## 2012-05-15 DIAGNOSIS — Z0000 Encounter for general adult medical examination without abnormal findings: Secondary | ICD-10-CM

## 2012-05-15 DIAGNOSIS — F528 Other sexual dysfunction not due to a substance or known physiological condition: Secondary | ICD-10-CM

## 2012-05-15 DIAGNOSIS — E65 Localized adiposity: Secondary | ICD-10-CM

## 2012-05-15 MED ORDER — TADALAFIL 20 MG PO TABS: 20.0000 mg | ORAL_TABLET | Freq: Every day | ORAL | Status: DC | PRN

## 2012-05-15 NOTE — Progress Notes (Signed)
[facility] at [facility]  Patient Name: Eddie Fox Date of Birth: 03/12/1941 Medical Record Number: [id number] Gender: male Date of Encounter: 05/15/2012  History of Present Illness:  Eddie Fox is a 71 y.o. very pleasant male patient who presents with the following:  Preventative Health Maintenance Visit:  Health Maintenance Summary Reviewed and updated, unless pt declines services.  Tobacco History Reviewed. Alcohol: No concerns, no excessive use Exercise Habits: Some activity, rec at least 30 mins 5 times a week STD concerns: no risk or activity to increase risk Drug Use: None Encouraged self-testicular check  Health Maintenance  Topic Date Due  . Influenza Vaccine  09/16/2012  . Colonoscopy  03/05/2019  . Tetanus/tdap  12/23/2019  . Pneumococcal Polysaccharide Vaccine Age 29 And Over  Completed  . Zostavax  Completed    Labs reviewed with the patient.   Lipids:    Component Value Date/Time   CHOL 103 05/08/2012 0929   TRIG 66.0 05/08/2012 0929   HDL 31.80* 05/08/2012 0929   LDLDIRECT 171.9 07/08/2007 1044   VLDL 13.2 05/08/2012 0929   CHOLHDL 3 05/08/2012 0929    CBC:    Component Value Date/Time   WBC 5.5 05/08/2012 0929   HGB 14.6 05/08/2012 0929   HCT 44.2 05/08/2012 0929   PLT 161.0 05/08/2012 0929   MCV 96.6 05/08/2012 0929   NEUTROABS 2.7 05/08/2012 0929   LYMPHSABS 1.7 05/08/2012 0929   MONOABS 0.5 05/08/2012 0929   EOSABS 0.7 05/08/2012 0929   BASOSABS 0.0 05/08/2012 0929    Basic Metabolic Panel:    Component Value Date/Time   NA 142 05/08/2012 0929   K 4.5 05/08/2012 0929   CL 108 05/08/2012 0929   CO2 25 05/08/2012 0929   BUN 23 05/08/2012 0929   CREATININE 1.0 05/08/2012 0929   GLUCOSE 86 05/08/2012 0929   CALCIUM 9.4 05/08/2012 0929    Lab Results  Component Value Date   ALT 25 05/08/2012   AST 22 05/08/2012   ALKPHOS 33* 05/08/2012   BILITOT 0.7 05/08/2012    Lab Results  Component Value Date   PSA 1.43 05/08/2012   PSA 0.80  02/05/2011   PSA 1.04 01/25/2010    Left-sided heel pain for 2 months. The patient does not recall any specific injury, insidious onset, heel pain. He has had stone bruises in the past. The posterior heel pain. No bruising. No swelling. Pain with walking. He has put his Dr. Margart Sickles insoles in his foot, shoe.  Patient Active Problem List  Diagnoses  . COLONIC POLYPS  . VITAMIN D DEFICIENCY  . HYPERLIPIDEMIA  . ERECTILE DYSFUNCTION  . ESSENTIAL HYPERTENSION  . FATTY LIVER DISEASE  . PEYRONIE'S DISEASE, HX OF  . CHOLECYSTECTOMY, LAPAROSCOPIC, HX OF   Past Medical History  Diagnosis Date  . Benign neoplasm of colon   . ED (erectile dysfunction)   . Unspecified essential hypertension   . Other chronic nonalcoholic liver disease   . Other and unspecified hyperlipidemia   . Calculus of kidney   . Unspecified vitamin D deficiency   . IBS (irritable bowel syndrome)   . Melanoma     history of   Past Surgical History  Procedure Date  . Cholecystectomy 10/03/09    lap  . 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 Laminectomy 1995    L 3-T11 Harrington rods in back  . Orif ankle fracture 1967  . Melanoma excision 1995    right  upper abd, Dr. Yetta Barre   History  Substance Use Topics  . Smoking status: Never Smoker   . Smokeless tobacco: Never Used  . Alcohol Use: No   Family History  Problem Relation Age of Onset  . Heart disease Mother     CHF  . Hypertension Mother   . Diabetes Mother   . Stroke Mother   . Hypertension Father   . Heart disease Father     CHF  . Hypertension Sister   . Depression Neg Hx   . Alcohol abuse Neg Hx   . Drug abuse Neg Hx   . Hypertension Sister    Allergies  Allergen Reactions  . Pneumococcal Vaccine     REACTION: significant arm swelling, redness    Medication list has been reviewed and updated.  Prior to Admission medications   Medication Sig Start Date End Date Taking? Authorizing Provider  amLODipine (NORVASC) 5  MG tablet TAKE ONE TABLET BY MOUTH TWICE DAILY 02/13/12  Yes Hannah Beat, MD  aspirin 81 MG tablet Take 81 mg by mouth daily.   Yes Historical Provider, MD  HYDROcodone-acetaminophen (VICODIN) 5-500 MG per tablet Take 0.5 tablets by mouth as needed.  02/08/12  Yes Historical Provider, MD  Multiple Vitamins-Minerals (CENTRUM SILVER ULTRA MENS) TABS Take 1 tablet by mouth daily.   Yes Historical Provider, MD  simvastatin (ZOCOR) 40 MG tablet TAKE ONE TABLET BY MOUTH EVERY NIGHT 02/13/12  Yes Hannah Beat, MD  tadalafil (CIALIS) 20 MG tablet Take 1 tablet (20 mg total) by mouth daily as needed for erectile dysfunction. 05/15/12 06/14/12  Hannah Beat, MD  tadalafil (CIALIS) 20 MG tablet Take 1 tablet (20 mg total) by mouth daily as needed for erectile dysfunction. 05/15/12 06/14/12  Hannah Beat, MD    Review of Systems:  General: Denies fever, chills, sweats. No significant weight loss. Eyes: Denies blurring,significant itching ENT: Denies earache, sore throat, and hoarseness. Cardiovascular: Denies chest pains, palpitations, dyspnea on exertion Respiratory: Denies cough, dyspnea at rest,wheeezing Breast: no concerns about lumps GI: Denies nausea, vomiting, diarrhea, constipation, change in bowel habits, abdominal pain, melena, hematochezia GU: Denies penile discharge, + ED, no urinary flow / outflow problems. No STD concerns. Musculoskeletal: Denies back pain, joint pain - as above Derm: Denies rash, itching Neuro: Denies  paresthesias, frequent falls, frequent headaches Psych: Denies depression, anxiety Endocrine: Denies cold intolerance, heat intolerance, polydipsia Heme: Denies enlarged lymph nodes Allergy: No hayfever   Physical Examination: Filed Vitals:   05/15/12 1105  BP: 118/70  Pulse: 60  Temp: 98.8 F (37.1 C)   Filed Vitals:   05/15/12 1105  Height: 5\' 9"  (1.753 m)  Weight: 199 lb (90.266 kg)   Body mass index is 29.39 kg/(m^2).   Wt Readings from Last 3  Encounters:  05/15/12 199 lb (90.266 kg)  02/16/11 206 lb (93.441 kg)  02/14/11 203 lb 8 oz (92.307 kg)    GEN: well developed, well nourished, no acute distress Eyes: conjunctiva and lids normal, PERRLA, EOMI ENT: TM clear, nares clear, oral exam WNL Neck: supple, no lymphadenopathy, no thyromegaly, no JVD Pulm: clear to auscultation and percussion, respiratory effort normal CV: regular rate and rhythm, S1-S2, no murmur, rub or gallop, no bruits, peripheral pulses normal and symmetric, no cyanosis, clubbing, edema or varicosities Chest: no scars, masses GI: soft, non-tender; no hepatosplenomegaly, masses; active bowel sounds all quadrants GU: no hernia, testicular mass, penile discharge, or prostate enlargement Lymph: no cervical, axillary or inguinal adenopathy MSK: gait normal, muscle  tone and strength WNL, no joint swelling, effusions, discoloration, crepitus. LEFT foot with no swelling or bruising.  Natural cavus foot. No significant longitudinal arch breakdown. Moderate transverse arch breakdown. Nontender throughout all bony anatomy. Achilles is nontender. Malleoli are nontender. Patient does have tenderness on the plantar aspect of the calcaneus. Nontender on the medial aspect of the plantar fascia and nontender with forced dorsiflexion. SKIN: clear, good turgor, color WNL, no rashes, lesions, or ulcerations Neuro: normal mental status, normal strength, sensation, and motion Psych: alert; oriented to person, place and time, normally interactive and not anxious or depressed in appearance.   Assessment and Plan:  1. Routine general medical examination at a health care facility   2. Fat pad syndrome   3. ERECTILE DYSFUNCTION    I have personally reviewed the Medicare Annual Wellness questionnaire and have noted 1. The patient's medical and social history 2. Their use of alcohol, tobacco or illicit drugs 3. Their current medications and supplements 4. The patient's functional  ability including ADL's, fall risks, home safety risks and hearing or visual             impairment. 5. Diet and physical activities 6. Evidence for depression or mood disorders  The patients weight, height, BMI and visual acuity have been recorded in the chart I have made referrals, counseling and provided education to the patient based review of the above and I have provided the pt with a written personalized care plan for preventive services.  I have provided the patient with a copy of your personalized plan for preventive services. Instructed to take the time to review along with their updated medication list.   Fat Pad: reviewed with him. I think this is more fat pad syndrome as opposed to plantar fasciitis. Reviewed some other additional padding and heel cups and he can add to history Menin retain and recommended that he use comfortable shoes.  Erectile dysfunction, the patient was given a rx  with a coupon for Cialis and a prescription for some additional Cialis.  Orders Today: No orders of the defined types were placed in this encounter.    Medications Today: Meds ordered this encounter  Medications  . HYDROcodone-acetaminophen (VICODIN) 5-500 MG per tablet    Sig: Take 0.5 tablets by mouth as needed.   Marland Kitchen aspirin 81 MG tablet    Sig: Take 81 mg by mouth daily.  . Multiple Vitamins-Minerals (CENTRUM SILVER ULTRA MENS) TABS    Sig: Take 1 tablet by mouth daily.  . tadalafil (CIALIS) 20 MG tablet    Sig: Take 1 tablet (20 mg total) by mouth daily as needed for erectile dysfunction.    Dispense:  3 tablet    Refill:  0  . tadalafil (CIALIS) 20 MG tablet    Sig: Take 1 tablet (20 mg total) by mouth daily as needed for erectile dysfunction.    Dispense:  5 tablet    Refill:  11     Merrissa Giacobbe, MD 05/16/2012 10:00 AM

## 2012-05-15 NOTE — Patient Instructions (Signed)
Tuli's heel cups  Can also try the pads with the hole in it.

## 2012-05-16 ENCOUNTER — Encounter: Payer: Self-pay | Admitting: Family Medicine

## 2012-09-15 ENCOUNTER — Other Ambulatory Visit: Payer: Self-pay | Admitting: Family Medicine

## 2012-12-15 ENCOUNTER — Other Ambulatory Visit: Payer: Self-pay | Admitting: Family Medicine

## 2012-12-22 ENCOUNTER — Other Ambulatory Visit: Payer: Self-pay

## 2012-12-22 NOTE — Telephone Encounter (Signed)
Pt left note requesting refills on simvastatin and amlodipine to [facility]. Spoke with [facility] pharmacist at [facility] and she verified pt still has refills available on both meds. Advised pt and he said when he calls to [facility] they tell he has no refills. Advised pt to speak with Melonie. Pt voiced understanding.

## 2013-03-13 ENCOUNTER — Telehealth: Payer: Self-pay

## 2013-03-13 NOTE — Telephone Encounter (Signed)
Pt has lab appt for CPX on 05/12/13 at 9:30 am; pt would like blood screened for any type cancer that can be tested for by blood work. 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

## 2013-03-15 NOTE — Telephone Encounter (Signed)
Call  We typically do a number of blood tests for men his age based on standardized national recommendations.  For cancer screening, a PSA test is done for prostate cancer screening.  No other cancer screening tests exist or are recommended.  See you soon!   Hannah Beat, MD 03/15/2013, 10:00 AM

## 2013-03-16 NOTE — Telephone Encounter (Signed)
Patient advised.

## 2013-03-17 ENCOUNTER — Other Ambulatory Visit: Payer: Self-pay | Admitting: Family Medicine

## 2013-03-17 DIAGNOSIS — Z79899 Other long term (current) drug therapy: Secondary | ICD-10-CM

## 2013-03-17 DIAGNOSIS — E785 Hyperlipidemia, unspecified: Secondary | ICD-10-CM

## 2013-03-17 DIAGNOSIS — Z125 Encounter for screening for malignant neoplasm of prostate: Secondary | ICD-10-CM

## 2013-03-17 NOTE — Telephone Encounter (Signed)
FLP, 272.4 Cbc with diff, HFP, BMET: v58.69 PSA: medicare psa

## 2013-03-17 NOTE — Telephone Encounter (Signed)
i meant to send to Camelia Eng  Thanks!  As above

## 2013-04-20 ENCOUNTER — Other Ambulatory Visit: Payer: Self-pay | Admitting: Family Medicine

## 2013-04-22 ENCOUNTER — Encounter: Payer: Self-pay | Admitting: Family Medicine

## 2013-04-22 ENCOUNTER — Ambulatory Visit (INDEPENDENT_AMBULATORY_CARE_PROVIDER_SITE_OTHER): Payer: Medicare Other | Admitting: Family Medicine

## 2013-04-22 VITALS — BP 130/72 | HR 64 | Temp 98.9°F | Ht 69.0 in | Wt 200.8 lb

## 2013-04-22 DIAGNOSIS — L570 Actinic keratosis: Secondary | ICD-10-CM

## 2013-04-22 DIAGNOSIS — C4491 Basal cell carcinoma of skin, unspecified: Secondary | ICD-10-CM

## 2013-04-22 DIAGNOSIS — L989 Disorder of the skin and subcutaneous tissue, unspecified: Secondary | ICD-10-CM

## 2013-04-22 NOTE — Patient Instructions (Addendum)
REFERRAL: GO THE THE FRONT ROOM AT THE ENTRANCE OF OUR CLINIC, NEAR CHECK IN. ASK FOR MARION. SHE WILL HELP YOU SET UP YOUR REFERRAL. DATE: TIME:  

## 2013-04-22 NOTE — Progress Notes (Signed)
Nature conservation officer at [facility] [address] [place] [address] Phone: [phone] Fax: [phone]  Date:  04/22/2013   Name:  Eddie Fox   DOB:  December 09, 1941   MRN:  [id number] Gender: male Age: 72 y.o.  Primary Physician:  Hannah Beat, MD  Evaluating MD: Hannah Beat, MD   Chief Complaint: Nevus and remove skin tags   History of Present Illness:  Eddie Fox is a 72 y.o. pleasant patient who presents with the following:  L BCC 4x5 mm, L arm - this has been present for 9 months and worsening L nose, lesion increasing in size  Skin tag on back Additional lesion on back  Patient Active Problem List   Diagnosis Date Noted  . CHOLECYSTECTOMY, LAPAROSCOPIC, HX OF 12/28/2010  . VITAMIN D DEFICIENCY 01/30/2010  . FATTY LIVER DISEASE 08/12/2009  . ESSENTIAL HYPERTENSION 09/14/2008  . COLONIC POLYPS 07/08/2007  . HYPERLIPIDEMIA 07/08/2007  . ERECTILE DYSFUNCTION 07/08/2007  . 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 Benign neoplasm of colon   . ED (erectile dysfunction)   . Unspecified essential hypertension   . Other chronic nonalcoholic liver disease   . Other and unspecified hyperlipidemia   . Calculus of kidney   . Unspecified vitamin D deficiency   . IBS (irritable bowel syndrome)   . Melanoma     history of    Past Surgical History  Procedure Laterality Date  . Cholecystectomy  10/03/09    lap  . 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 Laminectomy  1995    L 3-T11 Harrington rods in back  . Orif ankle fracture  1967  . Melanoma excision  1995    right upper abd, Dr. Yetta Barre    History   Social History  . Marital Status: Married    Spouse Name: N/A    Number of Children: 2  . Years of Education: N/A   Occupational History  . retired     retired from [employer] 04/2002   Social History Main Topics  . Smoking status: Never Smoker   . Smokeless tobacco:  Never Used  . Alcohol Use: No  . Drug Use: No  . Sexually Active: Yes -- Male partner(s)   Other Topics Concern  . Not on file   Social History Narrative  . No narrative on file    Family History  Problem Relation Age of Onset  . Heart disease Mother     CHF  . Hypertension Mother   . Diabetes Mother   . Stroke Mother   . Hypertension Father   . Heart disease Father     CHF  . Hypertension Sister   . Depression Neg Hx   . Alcohol abuse Neg Hx   . Drug abuse Neg Hx   . Hypertension Sister     Allergies  Allergen Reactions  . Pneumococcal Vaccine     REACTION: significant arm swelling, redness    Medication list has been reviewed and updated.  Outpatient Prescriptions Prior to Visit  Medication Sig Dispense Refill  . amLODipine (NORVASC) 5 MG tablet TAKE ONE TABLET BY MOUTH TWICE DAILY  60 tablet  5  . aspirin 81 MG tablet Take 81 mg by mouth daily.      . Multiple Vitamins-Minerals (CENTRUM SILVER ULTRA MENS) TABS Take 1 tablet by mouth daily.      . simvastatin (  ZOCOR) 40 MG tablet TAKE ONE TABLET BY MOUTH EVERY NIGHT  30 tablet  0  . HYDROcodone-acetaminophen (VICODIN) 5-500 MG per tablet Take 0.5 tablets by mouth as needed.       . tadalafil (CIALIS) 20 MG tablet Take 1 tablet (20 mg total) by mouth daily as needed for erectile dysfunction.  3 tablet  0  . tadalafil (CIALIS) 20 MG tablet Take 1 tablet (20 mg total) by mouth daily as needed for erectile dysfunction.  5 tablet  11   No facility-administered medications prior to visit.    Review of Systems:   GEN: No acute illnesses, no fevers, chills. GI: No n/v/d, eating normally Pulm: No SOB Interactive and getting along well at home.  Otherwise, ROS is as per the HPI.   Physical Examination: BP 130/72  Pulse 64  Temp(Src) 98.9 F (37.2 C) (Oral)  Ht 5\' 9"  (1.753 m)  Wt 200 lb 12 oz (91.06 kg)  BMI 29.63 kg/m2  SpO2 96%  Ideal Body Weight: Weight in (lb) to have BMI = 25: 168.9   GEN: WDWN,  NAD, Non-toxic, Alert & Oriented x 3 HEENT: Atraumatic, Normocephalic.  Ears and Nose: No external deformity. EXTR: No clubbing/cyanosis/edema NEURO: Normal gait.  PSYCH: Normally interactive. Conversant. Not depressed or anxious appearing.  Calm demeanor.   SKIN: L arm 4 x 5 mm lesion, crusting with outer edges appearing more pearly. Back, middle with obvious skin tag Flattened, small pearly lesion on L nasal fold L neck, small roughened area  Assessment and Plan:  Basal cell carcinoma - Plan: Ambulatory referral to Dermatology  Skin lesion  Consult derm about L arm BCC vs SCC and nasal fold lesion.  Skin tag removed with iris scissors without difficulty.  Cryotherapy  Reason: irritated, crusting, AK vs irritated seb k Location: L neck  Liquid nitrogen was applied using the liquid nitrogen gun without difficulty with an otoscope tip for concentration. Tolerated well without complications.   Orders Today:  Orders Placed This Encounter  Procedures  . Ambulatory referral to Dermatology    Referral Priority:  Routine    Referral Type:  Consultation    Referral Reason:  Specialty Services Required    Referred to Provider:  Wonda Amis, MD    Requested Specialty:  Dermatology    Number of Visits Requested:  1    Updated Medication List: (Includes new medications, updates to list, dose adjustments) Meds ordered this encounter  Medications  . Hydrocodone-Acetaminophen 5-300 MG TABS    Sig:     Medications Discontinued: Medications Discontinued During This Encounter  Medication Reason  . tadalafil (CIALIS) 20 MG tablet Error  . tadalafil (CIALIS) 20 MG tablet Error  . HYDROcodone-acetaminophen (VICODIN) 5-500 MG per tablet Error      Signed, Karolina Zamor T. Shene Maxfield, MD 04/22/2013 12:32 PM

## 2013-05-04 ENCOUNTER — Other Ambulatory Visit: Payer: Self-pay | Admitting: Dermatology

## 2013-05-12 ENCOUNTER — Other Ambulatory Visit (INDEPENDENT_AMBULATORY_CARE_PROVIDER_SITE_OTHER): Payer: Medicare Other

## 2013-05-12 DIAGNOSIS — Z79899 Other long term (current) drug therapy: Secondary | ICD-10-CM

## 2013-05-12 DIAGNOSIS — Z125 Encounter for screening for malignant neoplasm of prostate: Secondary | ICD-10-CM

## 2013-05-12 DIAGNOSIS — E785 Hyperlipidemia, unspecified: Secondary | ICD-10-CM

## 2013-05-12 LAB — CBC WITH DIFFERENTIAL/PLATELET
Basophils Absolute: 0 10*3/uL (ref 0.0–0.1)
Basophils Relative: 0.6 % (ref 0.0–3.0)
Eosinophils Absolute: 0.6 10*3/uL (ref 0.0–0.7)
Eosinophils Relative: 8.7 % — ABNORMAL HIGH (ref 0.0–5.0)
HCT: 45.1 % (ref 39.0–52.0)
Hemoglobin: 15.3 g/dL (ref 13.0–17.0)
Lymphocytes Relative: 29.6 % (ref 12.0–46.0)
Lymphs Abs: 2.2 10*3/uL (ref 0.7–4.0)
MCHC: 34 g/dL (ref 30.0–36.0)
MCV: 92.6 fl (ref 78.0–100.0)
Monocytes Absolute: 0.6 10*3/uL (ref 0.1–1.0)
Monocytes Relative: 8.2 % (ref 3.0–12.0)
Neutro Abs: 3.9 10*3/uL (ref 1.4–7.7)
Neutrophils Relative %: 52.9 % (ref 43.0–77.0)
Platelets: 186 10*3/uL (ref 150.0–400.0)
RBC: 4.87 Mil/uL (ref 4.22–5.81)
RDW: 13.3 % (ref 11.5–14.6)
WBC: 7.3 10*3/uL (ref 4.5–10.5)

## 2013-05-12 LAB — BASIC METABOLIC PANEL
BUN: 16 mg/dL (ref 6–23)
CO2: 24 mEq/L (ref 19–32)
Calcium: 9.5 mg/dL (ref 8.4–10.5)
Chloride: 106 mEq/L (ref 96–112)
Creatinine, Ser: 1.1 mg/dL (ref 0.4–1.5)
GFR: 67.3 mL/min (ref >60.00)
Glucose, Bld: 91 mg/dL (ref 70–99)
Potassium: 4.3 mEq/L (ref 3.5–5.1)
Sodium: 140 mEq/L (ref 135–145)

## 2013-05-12 LAB — PSA, MEDICARE: PSA: 1.02 ng/ml (ref 0.10–4.00)

## 2013-05-12 LAB — LIPID PANEL
Cholesterol: 112 mg/dL (ref 0–200)
HDL: 31.4 mg/dL — ABNORMAL LOW (ref >39.00)
LDL Cholesterol: 60 mg/dL (ref 0–99)
Total CHOL/HDL Ratio: 4
Triglycerides: 102 mg/dL (ref 0.0–149.0)
VLDL: 20.4 mg/dL (ref 0.0–40.0)

## 2013-05-12 LAB — HEPATIC FUNCTION PANEL
ALT: 24 U/L (ref 0–53)
AST: 21 U/L (ref 0–37)
Albumin: 4.2 g/dL (ref 3.5–5.2)
Alkaline Phosphatase: 37 U/L — ABNORMAL LOW (ref 39–117)
Bilirubin, Direct: 0.1 mg/dL (ref 0.0–0.3)
Total Bilirubin: 0.8 mg/dL (ref 0.3–1.2)
Total Protein: 7.1 g/dL (ref 6.0–8.3)

## 2013-05-18 ENCOUNTER — Encounter: Payer: Self-pay | Admitting: Family Medicine

## 2013-05-18 ENCOUNTER — Other Ambulatory Visit: Payer: Self-pay | Admitting: Family Medicine

## 2013-05-18 ENCOUNTER — Ambulatory Visit (INDEPENDENT_AMBULATORY_CARE_PROVIDER_SITE_OTHER): Payer: Medicare Other | Admitting: Family Medicine

## 2013-05-18 VITALS — BP 140/82 | HR 61 | Temp 98.1°F | Ht 69.0 in | Wt 198.0 lb

## 2013-05-18 DIAGNOSIS — Z0000 Encounter for general adult medical examination without abnormal findings: Secondary | ICD-10-CM

## 2013-05-18 NOTE — Progress Notes (Signed)
Nature conservation officer at [facility] [address] [place] [address] Phone: [phone] Fax: [phone]  Date:  05/18/2013   Name:  Eddie Fox   DOB:  November 11, 1941   MRN:  [id number] Gender: male Age: 72 y.o.  Primary Physician:  Hannah Beat, MD  Evaluating MD: Hannah Beat, MD   Chief Complaint: Annual Exam   History of Present Illness:  Eddie Fox is a 72 y.o. 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  Gets cramps with walking.   Preventative Health Maintenance Visit:  Health Maintenance Summary Reviewed and updated, unless pt declines services.  Tobacco History Reviewed. Alcohol: No concerns, no excessive use Exercise Habits: Some activity, rec at least 30 mins 5 times a week - fairly active, works in yard and walks STD concerns: no risk or activity to increase risk Drug Use: None Encouraged self-testicular check  Health Maintenance  Topic Date Due  . Influenza Vaccine  08/17/2013  . Colonoscopy  03/05/2019  . Tetanus/tdap  12/23/2019  . Pneumococcal Polysaccharide Vaccine Age 33 And Over  Completed  . Zostavax  Completed    Labs reviewed with the patient.  Results for orders placed in visit on 05/12/13  LIPID PANEL      Result Value Range   Cholesterol 112  0 - 200 mg/dL   Triglycerides 865.7  0.0 - 149.0 mg/dL   HDL 84.69 (*) >62.95 mg/dL   VLDL 28.4  0.0 - 13.2 mg/dL   LDL Cholesterol 60  0 - 99 mg/dL   Total CHOL/HDL Ratio 4    CBC WITH DIFFERENTIAL      Result Value Range   WBC 7.3  4.5 - 10.5 K/uL   RBC 4.87  4.22 - 5.81 Mil/uL   Hemoglobin 15.3  13.0 - 17.0 g/dL   HCT 44.0  10.2 - 72.5 %   MCV 92.6  78.0 - 100.0 fl   MCHC 34.0  30.0 - 36.0 g/dL   RDW 36.6  44.0 - 34.7 %   Platelets 186.0  150.0 - 400.0 K/uL   Neutrophils Relative % 52.9  43.0 - 77.0 %   Lymphocytes Relative 29.6  12.0 - 46.0 %   Monocytes Relative 8.2  3.0 - 12.0 %   Eosinophils  Relative 8.7 (*) 0.0 - 5.0 %   Basophils Relative 0.6  0.0 - 3.0 %   Neutro Abs 3.9  1.4 - 7.7 K/uL   Lymphs Abs 2.2  0.7 - 4.0 K/uL   Monocytes Absolute 0.6  0.1 - 1.0 K/uL   Eosinophils Absolute 0.6  0.0 - 0.7 K/uL   Basophils Absolute 0.0  0.0 - 0.1 K/uL  HEPATIC FUNCTION PANEL      Result Value Range   Total Bilirubin 0.8  0.3 - 1.2 mg/dL   Bilirubin, Direct 0.1  0.0 - 0.3 mg/dL   Alkaline Phosphatase 37 (*) 39 - 117 U/L   AST 21  0 - 37 U/L   ALT 24  0 - 53 U/L   Total Protein 7.1  6.0 - 8.3 g/dL   Albumin 4.2  3.5 - 5.2 g/dL  BASIC METABOLIC PANEL      Result Value Range   Sodium 140  135 - 145 mEq/L   Potassium 4.3  3.5 - 5.1 mEq/L   Chloride 106  96 - 112 mEq/L   CO2 24  19 - 32 mEq/L   Glucose, Bld  91  70 - 99 mg/dL   BUN 16  6 - 23 mg/dL   Creatinine, Ser 1.1  0.4 - 1.5 mg/dL   Calcium 9.5  8.4 - 19.1 mg/dL   GFR 47.82  >95.62 mL/min  PSA, MEDICARE      Result Value Range   PSA 1.02  0.10 - 4.00 ng/ml     Patient Active Problem List   Diagnosis Date Noted  . CHOLECYSTECTOMY, LAPAROSCOPIC, HX OF 12/28/2010  . VITAMIN D DEFICIENCY 01/30/2010  . FATTY LIVER DISEASE 08/12/2009  . ESSENTIAL HYPERTENSION 09/14/2008  . COLONIC POLYPS 07/08/2007  . HYPERLIPIDEMIA 07/08/2007  . ERECTILE DYSFUNCTION 07/08/2007  . 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 Benign neoplasm of colon   . ED (erectile dysfunction)   . Unspecified essential hypertension   . Other chronic nonalcoholic liver disease   . Other and unspecified hyperlipidemia   . Calculus of kidney   . Unspecified vitamin D deficiency   . IBS (irritable bowel syndrome)   . Melanoma     history of    Past Surgical History  Procedure Laterality Date  . Cholecystectomy  10/03/09    lap  . 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 Laminectomy  1995    L 3-T11 Harrington rods in back  . Orif ankle fracture  1967  . Melanoma excision  1995     right upper abd, Dr. Yetta Barre    History   Social History  . Marital Status: Married    Spouse Name: N/A    Number of Children: 2  . Years of Education: N/A   Occupational History  . retired     retired from [employer] 04/2002   Social History Main Topics  . Smoking status: Never Smoker   . Smokeless tobacco: Never Used  . Alcohol Use: No  . Drug Use: No  . Sexually Active: Yes -- Male partner(s)   Other Topics Concern  . Not on file   Social History Narrative  . No narrative on file    Family History  Problem Relation Age of Onset  . Heart disease Mother     CHF  . Hypertension Mother   . Diabetes Mother   . Stroke Mother   . Hypertension Father   . Heart disease Father     CHF  . Hypertension Sister   . Depression Neg Hx   . Alcohol abuse Neg Hx   . Drug abuse Neg Hx   . Hypertension Sister     Allergies  Allergen Reactions  . Pneumococcal Vaccine     REACTION: significant arm swelling, redness    Medication list has been reviewed and updated.  Outpatient Prescriptions Prior to Visit  Medication Sig Dispense Refill  . amLODipine (NORVASC) 5 MG tablet TAKE ONE TABLET BY MOUTH TWICE DAILY  60 tablet  5  . aspirin 81 MG tablet Take 81 mg by mouth daily.      . Multiple Vitamins-Minerals (CENTRUM SILVER ULTRA MENS) TABS Take 1 tablet by mouth daily.      . Hydrocodone-Acetaminophen 5-300 MG TABS        No facility-administered medications prior to visit.    Review of Systems:   General: Denies fever, chills, sweats. No significant weight loss. Eyes: Denies blurring,significant itching ENT: Denies earache, sore throat, and hoarseness. Cardiovascular: Denies chest pains, palpitations, dyspnea on exertion Respiratory: Denies cough,  dyspnea at rest,wheeezing Breast: no concerns about lumps GI: Denies nausea, vomiting, diarrhea, constipation, change in bowel habits, abdominal pain, melena, hematochezia GU: Denies penile discharge, ED, urinary flow  / outflow problems. No STD concerns. Musculoskeletal: Denies back pain, joint pain Derm: KNOWN MULTIPLE SKIN LESIONS, BCC REMOVED X 2, ADDITIONAL F/U WITH DR. Yetta Barre UPCOMING Neuro: Denies  paresthesias, frequent falls, frequent headaches Psych: Denies depression, anxiety Endocrine: Denies cold intolerance, heat intolerance, polydipsia Heme: Denies enlarged lymph nodes Allergy: No hayfever   Physical Examination: BP 140/82  Pulse 61  Temp(Src) 98.1 F (36.7 C) (Oral)  Ht 5\' 9"  (1.753 m)  Wt 198 lb (89.812 kg)  BMI 29.23 kg/m2  SpO2 97%  Ideal Body Weight: Weight in (lb) to have BMI = 25: 168.9   Wt Readings from Last 3 Encounters:  05/18/13 198 lb (89.812 kg)  04/22/13 200 lb 12 oz (91.06 kg)  05/15/12 199 lb (90.266 kg)    GEN: well developed, well nourished, no acute distress Eyes: conjunctiva and lids normal, PERRLA, EOMI ENT: TM clear, nares clear, oral exam WNL Neck: supple, no lymphadenopathy, no thyromegaly, no JVD Pulm: clear to auscultation and percussion, respiratory effort normal CV: regular rate and rhythm, S1-S2, no murmur, rub or gallop, no bruits, peripheral pulses normal and symmetric, no cyanosis, clubbing, edema or varicosities Chest: no scars, masses GI: soft, non-tender; no hepatosplenomegaly, masses; active bowel sounds all quadrants GU: no hernia, testicular mass, penile discharge, or prostate enlargement Lymph: no cervical, axillary or inguinal adenopathy MSK: area at L nares, L anterior chest - lesion of concern SKIN: clear, good turgor, color WNL, no rashes, lesions, or ulcerations Neuro: normal mental status, normal strength, sensation, and motion Psych: alert; oriented to person, place and time, normally interactive and not anxious or depressed in appearance.   Assessment and Plan:  Routine general medical examination at a health care facility  I have personally reviewed the Medicare Annual Wellness questionnaire and have noted 1. The  patient's medical and social history 2. Their use of alcohol, tobacco or illicit drugs 3. Their current medications and supplements 4. The patient's functional ability including ADL's, fall risks, home safety risks and hearing or visual             impairment. 5. Diet and physical activities 6. Evidence for depression or mood disorders  The patients weight, height, BMI and visual acuity have been recorded in the chart I have made referrals, counseling and provided education to the patient based review of the above and I have provided the pt with a written personalized care plan for preventive services.  I have provided the patient with a copy of your personalized plan for preventive services. Instructed to take the time to review along with their updated medication list.   Doing well, biggest thing now is getting his skin cancers taken off.  Orders Today:  No orders of the defined types were placed in this encounter.    Updated Medication List: (Includes new medications, updates to list, dose adjustments) Meds ordered this encounter  Medications  . HYDROcodone-acetaminophen (NORCO/VICODIN) 5-325 MG per tablet    Sig: Take 1 tablet by mouth 3 (three) times daily as needed.    Medications Discontinued: Medications Discontinued During This Encounter  Medication Reason  . Hydrocodone-Acetaminophen 5-300 MG TABS Error      Signed, Francely Craw T. Julianna Vanwagner, MD 05/18/2013 3:03 PM

## 2013-05-19 ENCOUNTER — Other Ambulatory Visit: Payer: Self-pay | Admitting: Dermatology

## 2013-06-15 ENCOUNTER — Other Ambulatory Visit: Payer: Self-pay | Admitting: Family Medicine

## 2013-07-17 ENCOUNTER — Other Ambulatory Visit: Payer: Self-pay | Admitting: Family Medicine

## 2013-08-12 ENCOUNTER — Other Ambulatory Visit: Payer: Self-pay | Admitting: Family Medicine

## 2013-08-12 NOTE — Telephone Encounter (Signed)
See warning for amlodipine and simvastatin.  Ok to refill?

## 2013-08-19 ENCOUNTER — Telehealth: Payer: Self-pay | Admitting: Family Medicine

## 2013-08-19 ENCOUNTER — Ambulatory Visit (INDEPENDENT_AMBULATORY_CARE_PROVIDER_SITE_OTHER): Payer: Medicare Other

## 2013-08-19 DIAGNOSIS — Z23 Encounter for immunization: Secondary | ICD-10-CM

## 2013-08-19 MED ORDER — PROMETHAZINE HCL 25 MG RE SUPP: 25.0000 mg | Freq: Four times a day (QID) | RECTAL | Status: DC | PRN

## 2013-08-19 NOTE — Telephone Encounter (Signed)
Ok, #30, 1 PR q 6 hours prn nausea, 4 refills

## 2013-08-19 NOTE — Telephone Encounter (Signed)
Pt left triage sheet requesting refill on Promethazine 25mg  suppositories sent to [facility].  I do not see this in pt's med hx.

## 2013-08-28 ENCOUNTER — Telehealth: Payer: Self-pay | Admitting: *Deleted

## 2013-08-28 NOTE — Telephone Encounter (Signed)
Received prior authorization request from pharmacy for promethazine 25mg . [place] forms requested from ins company and received, placed in Dr Ralene Cork.

## 2013-09-02 NOTE — Telephone Encounter (Signed)
Approval letter received for Promethazine; [employer] spoke with [facility] and rx went thru. Placed on Dr [employer] desk for signature and scanning.

## 2013-12-04 ENCOUNTER — Telehealth: Payer: Self-pay | Admitting: Family Medicine

## 2013-12-04 ENCOUNTER — Encounter: Payer: Self-pay | Admitting: Family Medicine

## 2013-12-04 ENCOUNTER — Ambulatory Visit (INDEPENDENT_AMBULATORY_CARE_PROVIDER_SITE_OTHER): Payer: Medicare Other | Admitting: Family Medicine

## 2013-12-04 VITALS — BP 126/80 | HR 60 | Temp 98.4°F | Ht 69.0 in | Wt 195.5 lb

## 2013-12-04 DIAGNOSIS — J209 Acute bronchitis, unspecified: Secondary | ICD-10-CM

## 2013-12-04 MED ORDER — AZITHROMYCIN 250 MG PO TABS: ORAL_TABLET | ORAL | Status: DC

## 2013-12-04 NOTE — Telephone Encounter (Signed)
[facility] Triage Call Report Triage Record Num: [id number] Operator: Hyman Bower Patient Name: Eddie Fox Call Date & Time: 12/03/2013 6:09:11PM Patient Phone: [phone] PCP: Hannah Beat Patient Gender: Male PCP Fax : [phone] Patient DOB: 14-Jul-1941 Practice Name: [facility] Reason for Call: Caller: [name]/Patient; PCP: Hannah Beat (Family Practice); CB#: [phone]; Call regarding Cough/Congestion; Onset 11/26/13. Productive cough with yellow/green mucus, runny nose/nasal congestion. Has taken Mucinex-DM with some relief noted. Patient is requesting antibiotic. Afebrile. Also reports sinus pain/pressure. Triaged per Upper Respiratory Infection guideline, disposition: see provider within 24 hours for "Pressure/pain above or below eyes, near ears over sinuses and yellow-green drainage from nose or down back of throat." Attempted to schedule an appointment on 12/04/13, but there were no appointments available. Instructed patient to call office after 7:15am on 12/04/13 in order to schedule an appointment. Patient verbalized understanding. Protocol(s) Used: Upper Respiratory Infection (URI) Recommended Outcome per Protocol: See Provider within 24 hours Reason for Outcome: Pressure/pain above or below eyes, near ears over sinuses (may also be described as fullness, worsens when bending forward, teeth or eye pain) AND yellow-green drainage from nose or down back of throat. Care Advice: ~ 12/03/2013 6:21:55PM Page 1 of 1 [id number]

## 2013-12-04 NOTE — Patient Instructions (Signed)
Complete antibiotics. Mucinex DM continue. Can use nasal saline irrigation. Call if not improvoing as expected. Go to ER if severe shortness of breath.

## 2013-12-04 NOTE — Assessment & Plan Note (Signed)
Given greater than 7 days of illness, worsening.. Will treat for bacterial superinfeciton.

## 2013-12-04 NOTE — Progress Notes (Signed)
Pre-visit discussion using our clinic review tool. No additional management support is needed unless otherwise documented below in the visit note.  

## 2013-12-04 NOTE — Progress Notes (Signed)
   Subjective:    Patient ID: Eddie Fox, male    DOB: 02/22/1941, 72 y.o.   MRN: [id number]  Cough This is a new problem. The current episode started in the past 7 days. The problem has been gradually worsening. The problem occurs constantly. The cough is productive of sputum. Associated symptoms include chills, a fever, headaches, nasal congestion and postnasal drip. Pertinent negatives include no ear congestion, ear pain, hemoptysis, myalgias, rash, rhinorrhea, sore throat, shortness of breath or wheezing. Associated symptoms comments: Subjective fever and chills 7 days ago. The symptoms are aggravated by lying down. Treatments tried: mucinex DM. The treatment provided moderate relief. There is no history of asthma, bronchiectasis, bronchitis, COPD, emphysema, environmental allergies or pneumonia. nonsmoker    Wife is a sick contact.  Review of Systems  Constitutional: Positive for fever and chills.  HENT: Positive for postnasal drip. Negative for ear pain, rhinorrhea and sore throat.   Respiratory: Positive for cough. Negative for hemoptysis, shortness of breath and wheezing.   Musculoskeletal: Negative for myalgias.  Skin: Negative for rash.  Allergic/Immunologic: Negative for environmental allergies.  Neurological: Positive for headaches.       Objective:   Physical Exam  Constitutional: Vital signs are normal. He appears well-developed and well-nourished.  Non-toxic appearance. He does not appear ill. No distress.  HENT:  Head: Normocephalic and atraumatic.  Right Ear: Hearing, tympanic membrane, external ear and ear canal normal. No tenderness. No foreign bodies. Tympanic membrane is not retracted and not bulging.  Left Ear: Hearing, tympanic membrane, external ear and ear canal normal. No tenderness. No foreign bodies. Tympanic membrane is not retracted and not bulging.  Nose: Nose normal. No mucosal edema or rhinorrhea. Right sinus exhibits no maxillary sinus tenderness and  no frontal sinus tenderness. Left sinus exhibits no maxillary sinus tenderness and no frontal sinus tenderness.  Mouth/Throat: Uvula is midline, oropharynx is clear and moist and mucous membranes are normal. Normal dentition. No dental caries. No oropharyngeal exudate or tonsillar abscesses.  Eyes: Conjunctivae, EOM and lids are normal. Pupils are equal, round, and reactive to light. Lids are everted and swept, no foreign bodies found.  Neck: Trachea normal, normal range of motion and phonation normal. Neck supple. Carotid bruit is not present. No mass and no thyromegaly present.  Cardiovascular: Normal rate, regular rhythm, S1 normal, S2 normal, normal heart sounds, intact distal pulses and normal pulses.  Exam reveals no gallop.   No murmur heard. Pulmonary/Chest: Effort normal and breath sounds normal. No respiratory distress. He has no wheezes. He has no rhonchi. He has no rales.  Abdominal: Soft. Normal appearance and bowel sounds are normal. There is no hepatosplenomegaly. There is no tenderness. There is no rebound, no guarding and no CVA tenderness. No hernia.  Neurological: He is alert. He has normal reflexes.  Skin: Skin is warm, dry and intact. No rash noted.  Psychiatric: He has a normal mood and affect. His speech is normal and behavior is normal. Judgment normal.          Assessment & Plan:

## 2013-12-18 ENCOUNTER — Telehealth (INDEPENDENT_AMBULATORY_CARE_PROVIDER_SITE_OTHER): Payer: Self-pay

## 2013-12-18 NOTE — Telephone Encounter (Signed)
Patient has been seen previously (2010) by Dr. Harlow Asa for gallbladder removal.  He is calling today stating that he has had epigastric pain that radiates up towards his sternum that began 6 months ago.  When he presses on his breast bone it is tender.  He is requesting he be seen by Dr. Harlow Asa for an abdominal US.  I explained that he would need to have a work up by his PCP before coming to see the general surgeon.  I had a long conversation with him to explain.  He does not like his PCP and says "he won't do anything anyway".  I did give him a name of an Internist in [place] to call.  He wants Dr. Harlow Asa to be aware of his problem.  I told him I would pass this message on to Dr. Harlow Asa, but unless he has a surgical problem he would not need to see him.  This patient was not convinced but said he would try to get an appointment with Dr. Unk Pinto.  He was given the telephone number.

## 2014-01-11 ENCOUNTER — Encounter: Payer: Self-pay | Admitting: Family Medicine

## 2014-01-11 ENCOUNTER — Ambulatory Visit (INDEPENDENT_AMBULATORY_CARE_PROVIDER_SITE_OTHER): Payer: Medicare Other | Admitting: Family Medicine

## 2014-01-11 VITALS — BP 140/80 | HR 65 | Temp 98.2°F | Ht 69.0 in | Wt 198.5 lb

## 2014-01-11 DIAGNOSIS — R1011 Right upper quadrant pain: Secondary | ICD-10-CM

## 2014-01-11 DIAGNOSIS — C4359 Malignant melanoma of other part of trunk: Secondary | ICD-10-CM

## 2014-01-11 DIAGNOSIS — K7689 Other specified diseases of liver: Secondary | ICD-10-CM

## 2014-01-11 DIAGNOSIS — Z9089 Acquired absence of other organs: Secondary | ICD-10-CM

## 2014-01-11 LAB — CBC WITH DIFFERENTIAL/PLATELET
Basophils Absolute: 0 10*3/uL (ref 0.0–0.1)
Basophils Relative: 0.3 % (ref 0.0–3.0)
Eosinophils Absolute: 0.3 10*3/uL (ref 0.0–0.7)
Eosinophils Relative: 4.5 % (ref 0.0–5.0)
HCT: 44.4 % (ref 39.0–52.0)
Hemoglobin: 15.3 g/dL (ref 13.0–17.0)
Lymphocytes Relative: 30.6 % (ref 12.0–46.0)
Lymphs Abs: 2 10*3/uL (ref 0.7–4.0)
MCHC: 34.4 g/dL (ref 30.0–36.0)
MCV: 92.5 fl (ref 78.0–100.0)
Monocytes Absolute: 0.6 10*3/uL (ref 0.1–1.0)
Monocytes Relative: 8.6 % (ref 3.0–12.0)
Neutro Abs: 3.6 10*3/uL (ref 1.4–7.7)
Neutrophils Relative %: 56 % (ref 43.0–77.0)
Platelets: 199 10*3/uL (ref 150.0–400.0)
RBC: 4.8 Mil/uL (ref 4.22–5.81)
RDW: 13.5 % (ref 11.5–14.6)
WBC: 6.5 10*3/uL (ref 4.5–10.5)

## 2014-01-11 LAB — BASIC METABOLIC PANEL
BUN: 19 mg/dL (ref 6–23)
CO2: 26 mEq/L (ref 19–32)
Calcium: 9.3 mg/dL (ref 8.4–10.5)
Chloride: 107 mEq/L (ref 96–112)
Creatinine, Ser: 1.1 mg/dL (ref 0.4–1.5)
GFR: 67.85 mL/min (ref >60.00)
Glucose, Bld: 88 mg/dL (ref 70–99)
Potassium: 4.3 mEq/L (ref 3.5–5.1)
Sodium: 142 mEq/L (ref 135–145)

## 2014-01-11 LAB — HEPATIC FUNCTION PANEL
ALT: 27 U/L (ref 0–53)
AST: 20 U/L (ref 0–37)
Albumin: 4.4 g/dL (ref 3.5–5.2)
Alkaline Phosphatase: 43 U/L (ref 39–117)
Bilirubin, Direct: 0 mg/dL (ref 0.0–0.3)
Total Bilirubin: 0.5 mg/dL (ref 0.3–1.2)
Total Protein: 7.2 g/dL (ref 6.0–8.3)

## 2014-01-11 LAB — LIPASE: Lipase: 25 U/L (ref 11.0–59.0)

## 2014-01-11 NOTE — Progress Notes (Signed)
Date:  01/11/2014   Name:  Eddie Fox   DOB:  Aug 21, 1941   MRN:  [id number] Gender: male Age: 73 y.o.  Primary Physician:  Owens Loffler, MD   Chief Complaint: Right Side Pain and Sharp Pain Right Temple   Subjective:   History of Present Illness:  Eddie Fox is a 73 y.o. pleasant patient who presents with the following:  Sig history of cholecystectomy in 2010 for chronic cholecystitis and symptomatic cholelithiasis with a negative intraoperative cholangiogram at time of surgery. He also has a history of fatty liver disease and a history of melanoma in 1995 removed by Dr. Ronnald Ramp on the R anterior abd wall. That pathology from 20 years ago is not available to me.   He has been having mostly RUQ pain for the last few months that has been worsening. He has still been able to eat, but it is worse after eating. No blood or bile - no emesis or BRBPR.  Health Maintenance  Topic Date Due  . Influenza Vaccine  07/17/2014  . Colonoscopy  03/05/2019  . Tetanus/tdap  12/23/2019  . Pneumococcal Polysaccharide Vaccine Age 36 And Over  Completed  . Zostavax  Completed     INTRAOPERATIVE CHOLANGIOGRAM  Technique: Cholangiographic images from the C-arm fluoroscopic  device were submitted for interpretation post-operatively. Please  see the procedural report for the amount of contrast and the  fluoroscopy time utilized.  Comparison: None  Findings: No persistent filling defects in the common duct.  Intrahepatic ducts are incompletely visualized, appearing  decompressed centrally. Contrast passes into the duodenum.  IMPRESSION  Negative for retained common duct stone.   Patient Active Problem List   Diagnosis Date Noted  . Acute bronchitis 12/04/2013  . CHOLECYSTECTOMY, LAPAROSCOPIC, HX OF 12/28/2010  . VITAMIN D DEFICIENCY 01/30/2010  . FATTY LIVER DISEASE 08/12/2009  . ESSENTIAL HYPERTENSION 09/14/2008  . COLONIC POLYPS 07/08/2007  . HYPERLIPIDEMIA 07/08/2007  .  ERECTILE DYSFUNCTION 07/08/2007  . 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 Benign neoplasm of colon   . ED (erectile dysfunction)   . Unspecified essential hypertension   . Other chronic nonalcoholic liver disease   . Other and unspecified hyperlipidemia   . Calculus of kidney   . Unspecified vitamin D deficiency   . IBS (irritable bowel syndrome)   . Melanoma     history of    Past Surgical History  Procedure Laterality Date  . Cholecystectomy  10/03/09    lap  . 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 Laminectomy  1995    L 3-T11 Harrington rods in back  . Orif ankle fracture  1967  . Melanoma excision  1995    right upper abd, Dr. Ronnald Ramp    History   Social History  . Marital Status: Married    Spouse Name: N/A    Number of Children: 2  . Years of Education: N/A   Occupational History  . retired     retired from [employer] 04/2002   Social History Main Topics  . Smoking status: Never Smoker   . Smokeless tobacco: Never Used  . Alcohol Use: No  . Drug Use: No  . Sexual Activity: Yes    Partners: Female   Other Topics Concern  . Not on file   Social History Narrative  . No narrative on file    Family History  Problem Relation  Age of Onset  . Heart disease Mother     CHF  . Hypertension Mother   . Diabetes Mother   . Stroke Mother   . Hypertension Father   . Heart disease Father     CHF  . Hypertension Sister   . Depression Neg Hx   . Alcohol abuse Neg Hx   . Drug abuse Neg Hx   . Hypertension Sister     Allergies  Allergen Reactions  . Pneumococcal Vaccine     REACTION: significant arm swelling, redness    Medication list has been reviewed and updated.  Review of Systems:   GEN: No acute illnesses, no fevers, chills. GI: No n/v/d, eating normally, o/w above. Pulm: No SOB Interactive and getting along well at home.  Otherwise, ROS is as per the HPI.  Objective:    Physical Examination: BP 140/80  Pulse 65  Temp(Src) 98.2 F (36.8 C) (Oral)  Ht 5\' 9"  (1.753 m)  Wt 198 lb 8 oz (90.039 kg)  BMI 29.30 kg/m2  Ideal Body Weight: Weight in (lb) to have BMI = 25: 168.9   GEN: WDWN, NAD, Non-toxic, A & O x 3 HEENT: Atraumatic, Normocephalic. Neck supple. No masses, No LAD. Ears and Nose: No external deformity. CV: RRR, No M/G/R. No JVD. No thrill. No extra heart sounds. PULM: CTA B, no wheezes, crackles, rhonchi. No retractions. No resp. distress. No accessory muscle use. ABD: S, mild tenderness RUQ and epigastric regions, ND, + BS, No rebound, lower liver lobe appears to be larger and lower than expected. No splenomegally. EXTR: No c/c/e NEURO Normal gait.  PSYCH: Normally interactive. Conversant. Not depressed or anxious appearing.  Calm demeanor.   Laboratory and Imaging Data: Results for orders placed in visit on 16/10/96  BASIC METABOLIC PANEL      Result Value Range   Sodium 142  135 - 145 mEq/L   Potassium 4.3  3.5 - 5.1 mEq/L   Chloride 107  96 - 112 mEq/L   CO2 26  19 - 32 mEq/L   Glucose, Bld 88  70 - 99 mg/dL   BUN 19  6 - 23 mg/dL   Creatinine, Ser 1.1  0.4 - 1.5 mg/dL   Calcium 9.3  8.4 - 10.5 mg/dL   GFR 67.85  >60.00 mL/min  CBC WITH DIFFERENTIAL      Result Value Range   WBC 6.5  4.5 - 10.5 K/uL   RBC 4.80  4.22 - 5.81 Mil/uL   Hemoglobin 15.3  13.0 - 17.0 g/dL   HCT 44.4  39.0 - 52.0 %   MCV 92.5  78.0 - 100.0 fl   MCHC 34.4  30.0 - 36.0 g/dL   RDW 13.5  11.5 - 14.6 %   Platelets 199.0  150.0 - 400.0 K/uL   Neutrophils Relative % 56.0  43.0 - 77.0 %   Lymphocytes Relative 30.6  12.0 - 46.0 %   Monocytes Relative 8.6  3.0 - 12.0 %   Eosinophils Relative 4.5  0.0 - 5.0 %   Basophils Relative 0.3  0.0 - 3.0 %   Neutro Abs 3.6  1.4 - 7.7 K/uL   Lymphs Abs 2.0  0.7 - 4.0 K/uL   Monocytes Absolute 0.6  0.1 - 1.0 K/uL   Eosinophils Absolute 0.3  0.0 - 0.7 K/uL   Basophils Absolute 0.0  0.0 - 0.1 K/uL  HEPATIC  FUNCTION PANEL      Result Value Range   Total Bilirubin 0.5  0.3 - 1.2 mg/dL   Bilirubin, Direct 0.0  0.0 - 0.3 mg/dL   Alkaline Phosphatase 43  39 - 117 U/L   AST 20  0 - 37 U/L   ALT 27  0 - 53 U/L   Total Protein 7.2  6.0 - 8.3 g/dL   Albumin 4.4  3.5 - 5.2 g/dL  LIPASE      Result Value Range   Lipase 25.0  11.0 - 59.0 U/L     Assessment & Plan:    Abdominal pain, right upper quadrant - Plan: Basic metabolic panel, CBC with Differential, Hepatic function panel, Lipase, CT Abdomen Pelvis W Contrast  CHOLECYSTECTOMY, LAPAROSCOPIC, HX OF  FATTY LIVER DISEASE  Melanoma of abdominal wall  RUQ abd pain of unclear source. Labs are normal. Obtain a CT of abdomen and pelvis with contrast to evaluate for potential liver or pancreatic lesion, cannot exclude neoplasm. Cannot exclude common bile duct stone. Diverticulitis less likely.  Patient Instructions  REFERRALS TO SPECIALISTS, SPECIAL TESTS (MRI, CT, ULTRASOUNDS)  GO THE WAITING ROOM AND TELL CHECK IN YOU NEED HELP WITH A REFERRAL. Either MARION or LINDA will help you set it up.  If it is between 1-2 PM they may be at lunch.  After 5 PM, they will likely be at home.  They will call you, so please make sure the office has your correct phone number.  Referrals sometimes can be done same day if urgent, but others can take 2 or 3 days to get an appointment. Starting in 2015, some of the new Medicare insurance plans and [place] offered on the Exchange take longer for referrals. They have added additional paperwork and steps.  MRI's and CT's can take up to a week for the test. (Emergencies like strokes take precedence. I will tell you if you have an emergency.)   Specialist appointment times vary a great deal, mostly on the specialist's schedule and if they have openings. -- Our office tries to get you in as fast as possible. -- Some specialists have very long wait times. (Example. Dermatology. Usually  months) -- If you have a true emergency like new cancer, we work to get you in ASAP.    Orders Placed This Encounter  Procedures  . CT Abdomen Pelvis W Contrast  . Basic metabolic panel  . CBC with Differential  . Hepatic function panel  . Lipase    New medications, updates to list, dose adjustments: Meds ordered this encounter  Medications  . Ibuprofen 200 MG CAPS    Sig: Take 600 mg by mouth as needed.    Signed,  Maud Deed. Kalina Morabito, MD, [place] [facility] [place] [place] [address] Phone: [phone] Fax: [phone]    Medication List       This list is accurate as of: 01/11/14 11:59 PM.  Always use your most recent med list.               amLODipine 5 MG tablet  Commonly known as:  NORVASC  TAKE ONE TABLET BY MOUTH TWICE DAILY     aspirin 325 MG tablet  Take 325 mg by mouth daily.     CENTRUM SILVER ULTRA MENS Tabs  Take 1 tablet by mouth daily.     Ibuprofen 200 MG Caps  Take 600 mg by mouth as needed.     simvastatin 40 MG tablet  Commonly known as:  ZOCOR  TAKE  ONE TABLET BY MOUTH EVERY NIGHT

## 2014-01-11 NOTE — Progress Notes (Signed)
Pre-visit discussion using our clinic review tool. No additional management support is needed unless otherwise documented below in the visit note.  

## 2014-01-11 NOTE — Patient Instructions (Signed)
REFERRALS TO SPECIALISTS, SPECIAL TESTS (MRI, CT, ULTRASOUNDS)  GO THE WAITING ROOM AND TELL CHECK IN YOU NEED HELP WITH A REFERRAL. Either MARION or LINDA will help you set it up.  If it is between 1-2 PM they may be at lunch.  After 5 PM, they will likely be at home.  They will call you, so please make sure the office has your correct phone number.  Referrals sometimes can be done same day if urgent, but others can take 2 or 3 days to get an appointment. Starting in 2015, some of the new Medicare insurance plans and Affordable Care Health plans offered on the Exchange take longer for referrals. They have added additional paperwork and steps.  MRI's and CT's can take up to a week for the test. (Emergencies like strokes take precedence. I will tell you if you have an emergency.)   Specialist appointment times vary a great deal, mostly on the specialist's schedule and if they have openings. -- Our office tries to get you in as fast as possible. -- Some specialists have very long wait times. (Example. Dermatology. Usually months) -- If you have a true emergency like new cancer, we work to get you in ASAP.   

## 2014-01-12 DIAGNOSIS — C4359 Malignant melanoma of other part of trunk: Secondary | ICD-10-CM | POA: Insufficient documentation

## 2014-01-12 DIAGNOSIS — Z85820 Personal history of malignant melanoma of skin: Secondary | ICD-10-CM | POA: Insufficient documentation

## 2014-01-13 ENCOUNTER — Ambulatory Visit (INDEPENDENT_AMBULATORY_CARE_PROVIDER_SITE_OTHER)
Admission: RE | Admit: 2014-01-13 | Discharge: 2014-01-13 | Disposition: A | Discharge status: Home / Self Care | Payer: Medicare Other | Source: Ambulatory Visit | Attending: Family Medicine | Admitting: Family Medicine

## 2014-01-13 ENCOUNTER — Telehealth: Payer: Self-pay

## 2014-01-13 DIAGNOSIS — R1011 Right upper quadrant pain: Secondary | ICD-10-CM

## 2014-01-13 MED ORDER — IOHEXOL 300 MG/ML  SOLN
100.0000 mL | Freq: Once | INTRAMUSCULAR | Status: AC | PRN
  Administered 2014-01-13: 100 mL via INTRAVENOUS

## 2014-01-13 NOTE — Telephone Encounter (Signed)
Pt due for colon in April 2015, Dr Ardis Hughs has asked for Dr Redmond [facility] records before scheduling.  Pt was contacted and will call and have records sent.

## 2014-01-25 ENCOUNTER — Other Ambulatory Visit: Payer: Self-pay | Admitting: Family Medicine

## 2014-01-29 ENCOUNTER — Telehealth: Payer: Self-pay

## 2014-01-29 NOTE — Telephone Encounter (Signed)
Was advised pt was calling for his wife Lex Linhares. Not to go on pts chart.

## 2014-01-29 NOTE — Telephone Encounter (Signed)
Pt left message requesting new glucose meter; do not see meter on pts med list and ? Dx for glucose meter. Left v/m for pt to cb.

## 2014-03-15 ENCOUNTER — Other Ambulatory Visit: Payer: Self-pay | Admitting: Dermatology

## 2014-03-31 ENCOUNTER — Other Ambulatory Visit: Payer: Self-pay | Admitting: Family Medicine

## 2014-05-11 ENCOUNTER — Telehealth: Payer: Self-pay | Admitting: Family Medicine

## 2014-05-11 NOTE — Telephone Encounter (Signed)
Left message with Mrs. Nikki Dom that we will check a urinalysis tomorrow when Mr. Braxton comes in for his lab work.

## 2014-05-11 NOTE — Telephone Encounter (Signed)
Okay to add a UA

## 2014-05-11 NOTE — Telephone Encounter (Signed)
Fwd to terri:  UA and micro: send to lab, hematuria

## 2014-05-11 NOTE — Telephone Encounter (Signed)
Caller: Amor/Patient; Phone: [phone]; Reason for Call: Calling to see if he can do a U/A tomorrow when he does Blood work, Thinks he may have a kidney stone and would just like to be extra sure.  His B/W appointment is 05/12/2014 @ 9: 00a.  M please let him know.  Jms/can

## 2014-05-12 ENCOUNTER — Other Ambulatory Visit (INDEPENDENT_AMBULATORY_CARE_PROVIDER_SITE_OTHER): Payer: Medicare Other

## 2014-05-12 DIAGNOSIS — R5383 Other fatigue: Secondary | ICD-10-CM

## 2014-05-12 DIAGNOSIS — K7689 Other specified diseases of liver: Secondary | ICD-10-CM

## 2014-05-12 DIAGNOSIS — I10 Essential (primary) hypertension: Secondary | ICD-10-CM

## 2014-05-12 DIAGNOSIS — Z79899 Other long term (current) drug therapy: Secondary | ICD-10-CM

## 2014-05-12 DIAGNOSIS — M109 Gout, unspecified: Secondary | ICD-10-CM

## 2014-05-12 DIAGNOSIS — R319 Hematuria, unspecified: Secondary | ICD-10-CM

## 2014-05-12 DIAGNOSIS — E785 Hyperlipidemia, unspecified: Secondary | ICD-10-CM

## 2014-05-12 DIAGNOSIS — Z125 Encounter for screening for malignant neoplasm of prostate: Secondary | ICD-10-CM

## 2014-05-12 DIAGNOSIS — R5381 Other malaise: Secondary | ICD-10-CM

## 2014-05-12 LAB — CBC WITH DIFFERENTIAL/PLATELET
Basophils Absolute: 0 10*3/uL (ref 0.0–0.1)
Basophils Relative: 0.6 % (ref 0.0–3.0)
Eosinophils Absolute: 0.5 10*3/uL (ref 0.0–0.7)
Eosinophils Relative: 8.3 % — ABNORMAL HIGH (ref 0.0–5.0)
HCT: 43.6 % (ref 39.0–52.0)
Hemoglobin: 14.8 g/dL (ref 13.0–17.0)
Lymphocytes Relative: 34.2 % (ref 12.0–46.0)
Lymphs Abs: 2.1 10*3/uL (ref 0.7–4.0)
MCHC: 33.9 g/dL (ref 30.0–36.0)
MCV: 94.5 fl (ref 78.0–100.0)
Monocytes Absolute: 0.5 10*3/uL (ref 0.1–1.0)
Monocytes Relative: 7.4 % (ref 3.0–12.0)
Neutro Abs: 3 10*3/uL (ref 1.4–7.7)
Neutrophils Relative %: 49.5 % (ref 43.0–77.0)
Platelets: 190 10*3/uL (ref 150.0–400.0)
RBC: 4.61 Mil/uL (ref 4.22–5.81)
RDW: 12.9 % (ref 11.5–15.5)
WBC: 6.1 10*3/uL (ref 4.0–10.5)

## 2014-05-12 LAB — PSA, MEDICARE: PSA: 2.35 ng/ml (ref 0.10–4.00)

## 2014-05-12 LAB — HEPATIC FUNCTION PANEL
ALT: 22 U/L (ref 0–53)
AST: 20 U/L (ref 0–37)
Albumin: 4.2 g/dL (ref 3.5–5.2)
Alkaline Phosphatase: 38 U/L — ABNORMAL LOW (ref 39–117)
Bilirubin, Direct: 0.2 mg/dL (ref 0.0–0.3)
Total Bilirubin: 0.8 mg/dL (ref 0.2–1.2)
Total Protein: 6.6 g/dL (ref 6.0–8.3)

## 2014-05-12 LAB — BASIC METABOLIC PANEL
BUN: 17 mg/dL (ref 6–23)
CO2: 26 mEq/L (ref 19–32)
Calcium: 9.4 mg/dL (ref 8.4–10.5)
Chloride: 110 mEq/L (ref 96–112)
Creatinine, Ser: 1.1 mg/dL (ref 0.4–1.5)
GFR: 71.43 mL/min (ref >60.00)
Glucose, Bld: 85 mg/dL (ref 70–99)
Potassium: 4.1 mEq/L (ref 3.5–5.1)
Sodium: 142 mEq/L (ref 135–145)

## 2014-05-12 LAB — URINALYSIS WITH CULTURE, IF INDICATED
Bilirubin Urine: NEGATIVE
Hgb urine dipstick: NEGATIVE
Ketones, ur: NEGATIVE
Leukocytes, UA: NEGATIVE
Nitrite: NEGATIVE
Specific Gravity, Urine: 1.025 (ref 1.000–1.030)
Total Protein, Urine: NEGATIVE
Urine Glucose: NEGATIVE
Urobilinogen, UA: 0.2 (ref 0.0–1.0)
pH: 6 (ref 5.0–8.0)

## 2014-05-12 LAB — LIPID PANEL
Cholesterol: 111 mg/dL (ref 0–200)
HDL: 33.7 mg/dL — ABNORMAL LOW (ref >39.00)
LDL Cholesterol: 61 mg/dL (ref 0–99)
Total CHOL/HDL Ratio: 3
Triglycerides: 84 mg/dL (ref 0.0–149.0)
VLDL: 16.8 mg/dL (ref 0.0–40.0)

## 2014-05-12 LAB — TSH: TSH: 3.27 u[IU]/mL (ref 0.35–4.50)

## 2014-05-12 NOTE — Telephone Encounter (Signed)
Labs drawn today, and urine was obtained for a UA with micro.

## 2014-05-19 ENCOUNTER — Ambulatory Visit (INDEPENDENT_AMBULATORY_CARE_PROVIDER_SITE_OTHER)
Admission: RE | Admit: 2014-05-19 | Discharge: 2014-05-19 | Disposition: A | Discharge status: Home / Self Care | Payer: Medicare Other | Source: Ambulatory Visit | Attending: Family Medicine | Admitting: Family Medicine

## 2014-05-19 ENCOUNTER — Ambulatory Visit (INDEPENDENT_AMBULATORY_CARE_PROVIDER_SITE_OTHER): Payer: Medicare Other | Admitting: Family Medicine

## 2014-05-19 ENCOUNTER — Encounter: Payer: Self-pay | Admitting: Family Medicine

## 2014-05-19 VITALS — BP 130/70 | HR 58 | Temp 98.3°F | Ht 68.31 in | Wt 197.0 lb

## 2014-05-19 DIAGNOSIS — M79609 Pain in unspecified limb: Secondary | ICD-10-CM

## 2014-05-19 DIAGNOSIS — Z0000 Encounter for general adult medical examination without abnormal findings: Secondary | ICD-10-CM

## 2014-05-19 DIAGNOSIS — F528 Other sexual dysfunction not due to a substance or known physiological condition: Secondary | ICD-10-CM

## 2014-05-19 DIAGNOSIS — M79672 Pain in left foot: Secondary | ICD-10-CM

## 2014-05-19 DIAGNOSIS — E785 Hyperlipidemia, unspecified: Secondary | ICD-10-CM

## 2014-05-19 DIAGNOSIS — I10 Essential (primary) hypertension: Secondary | ICD-10-CM

## 2014-05-19 MED ORDER — SILDENAFIL CITRATE 20 MG PO TABS: ORAL_TABLET | ORAL | Status: DC

## 2014-05-19 NOTE — Progress Notes (Signed)
Pre visit review using our clinic review tool, if applicable. No additional management support is needed unless otherwise documented below in the visit note. 

## 2014-05-19 NOTE — Progress Notes (Signed)
[place] [address] Phone: [phone] Fax: [phone]  Patient ID: Eddie Fox MRN: [id number], DOB: 1941/05/08, 73 y.o. Date of Encounter: 05/19/2014  Primary Physician:  Owens Loffler, MD   Chief Complaint: Medicare Wellness  Subjective:   History of Present Illness:  Eddie Fox is a 73 y.o. pleasant patient who presents for a medicare wellness examination:  Preventative Health Maintenance Visit, Medicare Wellness and f/u:  Health Maintenance Summary Reviewed and updated, unless pt declines services.  Tobacco History Reviewed. Alcohol: No concerns, no excessive use Exercise Habits: Some activity, rec at least 30 mins 5 times a week STD concerns: no risk or activity to increase risk Drug Use: None Encouraged self-testicular check  Health Maintenance  Topic Date Due  . Influenza Vaccine  07/17/2014  . Colonoscopy  03/05/2019  . Tetanus/tdap  12/23/2019  . Pneumococcal Polysaccharide Vaccine Age 107 And Over  Completed  . Zostavax  Completed    Immunization History  Administered Date(s) Administered  . Influenza Split 12/25/2011  . Influenza,inj,Quad PF,36+ Mos 08/19/2013  . Td 03/24/2002, 12/22/2009   Medicare CPX:  Hurting some in the left lower abdomen and drank some cranberry juice and apple juice.   Pneumovax - swelling in the r arm.   Questions about feet. Particularly his LEFT foot has been hurting quite a bit, and he rolled his forefoot about one month ago. He questions whether or not he could have fractured this area, mostly in the bunionette formation. He never sought any medical device about this.  He also has some other skeletal questions and a previous physician that he saw over 20 years ago told him "he did shave part of the bone off of his forefoot."  HTN: Tolerating all medications without side effects Stable and at goal No CP, no sob. No HA.  BP Readings from Last 3 Encounters:  05/19/14 130/70  01/11/14  140/80  12/04/13 675/91    Basic Metabolic Panel:    Component Value Date/Time   NA 142 05/12/2014 1124   K 4.1 05/12/2014 1124   CL 110 05/12/2014 1124   CO2 26 05/12/2014 1124   BUN 17 05/12/2014 1124   CREATININE 1.1 05/12/2014 1124   GLUCOSE 85 05/12/2014 1124   CALCIUM 9.4 05/12/2014 1124   Lipids: Doing well, stable. Tolerating meds fine with no SE. Panel reviewed with patient.  Lipids:    Component Value Date/Time   CHOL 111 05/12/2014 1124   TRIG 84.0 05/12/2014 1124   HDL 33.70* 05/12/2014 1124   LDLDIRECT 171.9 07/08/2007 1044   VLDL 16.8 05/12/2014 1124   CHOLHDL 3 05/12/2014 1124    Lab Results  Component Value Date   ALT 22 05/12/2014   AST 20 05/12/2014   ALKPHOS 38* 05/12/2014   BILITOT 0.8 05/12/2014    He also has persistent erectile dysfunction.  Patient Active Problem List   Diagnosis Date Noted  . 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 CHOLECYSTECTOMY, LAPAROSCOPIC, HX OF 12/28/2010  . VITAMIN D DEFICIENCY 01/30/2010  . FATTY LIVER DISEASE 08/12/2009  . ESSENTIAL HYPERTENSION 09/14/2008  . COLONIC POLYPS 07/08/2007  . HYPERLIPIDEMIA 07/08/2007  . ERECTILE DYSFUNCTION 07/08/2007  . 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 Benign neoplasm of colon   . ED (erectile dysfunction)   . Unspecified essential hypertension   . Other chronic nonalcoholic liver disease   . Other and unspecified hyperlipidemia   . Calculus of kidney   .  Unspecified vitamin D deficiency   . IBS (irritable bowel syndrome)   . Melanoma     history of   Past Surgical History  Procedure Laterality Date  . Cholecystectomy  10/03/09    lap  . 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 Laminectomy  1995    L 3-T11 Harrington rods in back  . Orif ankle fracture  1967  . Melanoma excision  1995    right upper abd, Dr. Ronnald Ramp   History   Social History  . Marital Status: Married    Spouse Name: N/A    Number of  Children: 2  . Years of Education: N/A   Occupational History  . retired     retired from [employer] 04/2002   Social History Main Topics  . Smoking status: Never Smoker   . Smokeless tobacco: Never Used  . Alcohol Use: No  . Drug Use: No  . Sexual Activity: Yes    Partners: Female   Other Topics Concern  . Not on file   Social History Narrative  . No narrative on file   Family History  Problem Relation Age of Onset  . Heart disease Mother     CHF  . Hypertension Mother   . Diabetes Mother   . Stroke Mother   . Hypertension Father   . Heart disease Father     CHF  . Hypertension Sister   . Depression Neg Hx   . Alcohol abuse Neg Hx   . Drug abuse Neg Hx   . Hypertension Sister    Allergies  Allergen Reactions  . Pneumococcal Vaccine     REACTION: significant arm swelling, redness   Medication list has been reviewed and updated.  Review of Systems:  General: Denies fever, chills, sweats. No significant weight loss. Eyes: Denies blurring,significant itching ENT: Denies earache, sore throat, and hoarseness. Cardiovascular: Denies chest pains, palpitations, dyspnea on exertion Respiratory: Denies cough, dyspnea at rest,wheeezing Breast: no concerns about lumps GI: Denies nausea, vomiting, diarrhea, constipation, change in bowel habits, abdominal pain, melena, hematochezia GU: Denies penile discharge, + ED,no urinary flow / outflow problems. No STD concerns. Musculoskeletal: Denies back pain, joint pain. Foot pain is noted Derm: Denies rash, itching Neuro: Denies  paresthesias, frequent falls, frequent headaches Psych: Denies depression, anxiety Endocrine: Denies cold intolerance, heat intolerance, polydipsia Heme: Denies enlarged lymph nodes Allergy: No hayfever  Objective:   Physical Examination: BP 130/70  Pulse 58  Temp(Src) 98.3 F (36.8 C) (Oral)  Ht 5' 8.31" (1.735 m)  Wt 197 lb (89.359 kg)  BMI 29.69 kg/m2 Ideal Body Weight: Weight in (lb)  to have BMI = 25: 165.6  The patient completed a fall screen and PHQ-2 and PHQ-9 if necessary, which is documented in the EHR. The CMA/LPN/RN who assisted the patient verbally completed with them and documented results in [facility].   Hearing Screening   Method: Audiometry   125Hz  250Hz  500Hz  1000Hz  2000Hz  4000Hz  8000Hz   Right ear:   40 40 0 0   Left ear:   40 0 0 0     Visual Acuity Screening   Right eye Left eye Both eyes  Without correction: 20/13 20/13 20/13   With correction:      GEN: well developed, well nourished, no acute distress Eyes: conjunctiva and lids normal, PERRLA, EOMI ENT: TM clear, nares clear, oral exam WNL Neck: supple, no lymphadenopathy, no thyromegaly, no JVD Pulm: clear to  auscultation and percussion, respiratory effort normal CV: regular rate and rhythm, S1-S2, no murmur, rub or gallop, no bruits, peripheral pulses normal and symmetric, no cyanosis, clubbing, edema or varicosities GI: soft, non-tender; no hepatosplenomegaly, masses; active bowel sounds all quadrants GU: no hernia, testicular mass, penile discharge Lymph: no cervical, axillary or inguinal adenopathy MSK: gait normal, muscle tone and strength WNL, no joint swelling, effusions, discoloration, crepitus.  LEFT FOOT, there is a dramatic amount of forefoot breakdown with complete transverse arch collapse, moderate bunion and very significant bunionette formation. There is dramatic toe splaying and lateral deviation of the 2nd digit.   There is mild tenderness at the 5th MTP. Nontender along all the metatarsals. Nontender at the true ankle joint. Nontender along all ligaments of the ankle. Stable subtalar tilt testing and anterior drawer testing.  SKIN: clear, good turgor, color WNL, no rashes, lesions, or ulcerations Neuro: normal mental status, normal strength, sensation, and motion Psych: alert; oriented to person, place and time, normally interactive and not anxious or depressed in  appearance.  All labs reviewed with patient.  Lipids:    Component Value Date/Time   CHOL 111 05/12/2014 1124   TRIG 84.0 05/12/2014 1124   HDL 33.70* 05/12/2014 1124   LDLDIRECT 171.9 07/08/2007 1044   VLDL 16.8 05/12/2014 1124   CHOLHDL 3 05/12/2014 1124   CBC: CBC Latest Ref Rng 05/12/2014 01/11/2014 05/12/2013  WBC 4.0 - 10.5 K/uL 6.1 6.5 7.3  Hemoglobin 13.0 - 17.0 g/dL 14.8 15.3 15.3  Hematocrit 39.0 - 52.0 % 43.6 44.4 45.1  Platelets 150.0 - 400.0 K/uL 190.0 199.0 186.0    Basic Metabolic Panel:    Component Value Date/Time   NA 142 05/12/2014 1124   K 4.1 05/12/2014 1124   CL 110 05/12/2014 1124   CO2 26 05/12/2014 1124   BUN 17 05/12/2014 1124   CREATININE 1.1 05/12/2014 1124   GLUCOSE 85 05/12/2014 1124   CALCIUM 9.4 05/12/2014 1124   Hepatic Function Latest Ref Rng 05/12/2014 01/11/2014 05/12/2013  Total Protein 6.0 - 8.3 g/dL 6.6 7.2 7.1  Albumin 3.5 - 5.2 g/dL 4.2 4.4 4.2  AST 0 - 37 U/L $Remo'20 20 21  'OtfRz$ ALT 0 - 53 U/L $Remo'22 27 24  'XuFiQ$ Alk Phosphatase 39 - 117 U/L 38(L) 43 37(L)  Total Bilirubin 0.2 - 1.2 mg/dL 0.8 0.5 0.8  Bilirubin, Direct 0.0 - 0.3 mg/dL 0.2 0.0 0.1    Lab Results  Component Value Date   TSH 3.27 05/12/2014   Lab Results  Component Value Date   PSA 2.35 05/12/2014   PSA 1.02 05/12/2013   PSA 1.43 05/08/2012   Dg Foot Complete Left  05/19/2014   CLINICAL DATA:  Left foot pain for 1 month  EXAM: LEFT FOOT - COMPLETE 3+ VIEW  COMPARISON:  None.  FINDINGS: Tarsal metatarsal alignment appears normal. No fracture is seen. Joint spaces appear normal. No erosion is noted. A small plantar calcaneal degenerative spur is present.  IMPRESSION: No acute abnormality.  Small plantar calcaneal degenerative spur.   Electronically Signed   By: Ivar Drape M.D.   On: 05/19/2014 16:34    Assessment & Plan:   Routine general medical examination at a health care facility  Left foot pain - Plan: DG Foot Complete Left:  There is no fracture. The patient does have severe forefoot  breakdown and transverse arch collapse. We discussed footwear at length. I would be very reluctant to undergo any form of bunionette surgery. I have no idea who  he talked about this with, and I offered more than a half dozen names, but if he persisted in strongly desiring a consultation I would recommend that he see either Dr. Sharol Given or Dr. Doran Durand.   ERECTILE DYSFUNCTION: he was given a sample of Cialis in the office. Also gave him a prescription for 20 mg of Sildanefil.  ESSENTIAL HYPERTENSION: stable  HYPERLIPIDEMIA: stable  Health Maintenance Exam: The patient's preventative maintenance and recommended screening tests for an annual wellness exam were reviewed in full today. Brought up to date unless services declined.  Counselled on the importance of diet, exercise, and its role in overall health and mortality. The patient's FH and SH was reviewed, including their home life, tobacco status, and drug and alcohol status.  I have personally reviewed the Medicare Annual Wellness questionnaire and have noted 1. The patient's medical and social history 2. Their use of alcohol, tobacco or illicit drugs 3. Their current medications and supplements 4. The patient's functional ability including ADL's, fall risks, home safety risks and hearing or visual             impairment. 5. Diet and physical activities 6. Evidence for depression or mood disorders  The patients weight, height, BMI and visual acuity have been recorded in the chart I have made referrals, counseling and provided education to the patient based review of the above and I have provided the pt with a written personalized care plan for preventive services.  I have provided the patient with a copy of your personalized plan for preventive services. Instructed to take the time to review along with their updated medication list.  Additional information on Medicare Wellness paperwork is scanned into the chart.   Follow-up: No Follow-up on  file. Or follow-up in 1 year for complete physical examination  New Prescriptions   SILDENAFIL (REVATIO) 20 MG TABLET    2 to 4 tablets prior to intercourse.   Modified Medications   No medications on file   Orders Placed This Encounter  Procedures  . DG Foot Complete Left    Signed,  Babatunde Seago T. Niaya Hickok, MD, [facility] Sports Medicine   Previous Medications   AMLODIPINE (NORVASC) 5 MG TABLET    TAKE ONE TABLET BY MOUTH TWICE DAILY   ASPIRIN 325 MG TABLET    Take 325 mg by mouth daily.   IBUPROFEN 200 MG CAPS    Take 600 mg by mouth as needed.   MULTIPLE VITAMINS-MINERALS (CENTRUM SILVER ULTRA MENS) TABS    Take 1 tablet by mouth daily.   SIMVASTATIN (ZOCOR) 40 MG TABLET    TAKE ONE TABLET BY MOUTH EVERY NIGHT   Discontinued Medications   AMLODIPINE (NORVASC) 5 MG TABLET    TAKE ONE TABLET BY MOUTH TWICE DAILY   SIMVASTATIN (ZOCOR) 40 MG TABLET    TAKE ONE TABLET BY MOUTH ONCE EVERY NIGHT

## 2014-05-19 NOTE — Patient Instructions (Signed)
[employer] at [employer] [employer]

## 2014-06-01 ENCOUNTER — Other Ambulatory Visit: Payer: Self-pay | Admitting: Family Medicine

## 2014-08-02 ENCOUNTER — Other Ambulatory Visit: Payer: Self-pay | Admitting: Family Medicine

## 2014-08-26 ENCOUNTER — Ambulatory Visit (INDEPENDENT_AMBULATORY_CARE_PROVIDER_SITE_OTHER): Payer: Medicare Other

## 2014-08-26 DIAGNOSIS — Z23 Encounter for immunization: Secondary | ICD-10-CM

## 2014-08-30 ENCOUNTER — Other Ambulatory Visit: Payer: Self-pay | Admitting: Family Medicine

## 2015-01-18 ENCOUNTER — Other Ambulatory Visit: Payer: Self-pay | Admitting: Dermatology

## 2015-01-18 DIAGNOSIS — C44519 Basal cell carcinoma of skin of other part of trunk: Secondary | ICD-10-CM | POA: Diagnosis not present

## 2015-01-18 DIAGNOSIS — D045 Carcinoma in situ of skin of trunk: Secondary | ICD-10-CM | POA: Diagnosis not present

## 2015-01-18 DIAGNOSIS — L570 Actinic keratosis: Secondary | ICD-10-CM | POA: Diagnosis not present

## 2015-01-18 DIAGNOSIS — L821 Other seborrheic keratosis: Secondary | ICD-10-CM | POA: Diagnosis not present

## 2015-01-26 ENCOUNTER — Other Ambulatory Visit: Payer: Self-pay | Admitting: Family Medicine

## 2015-02-08 ENCOUNTER — Ambulatory Visit (INDEPENDENT_AMBULATORY_CARE_PROVIDER_SITE_OTHER): Payer: Medicare Other | Admitting: Family Medicine

## 2015-02-08 ENCOUNTER — Encounter: Payer: Self-pay | Admitting: Family Medicine

## 2015-02-08 VITALS — BP 122/62 | HR 55 | Temp 98.6°F | Ht 68.31 in | Wt 201.5 lb

## 2015-02-08 DIAGNOSIS — H6981 Other specified disorders of Eustachian tube, right ear: Secondary | ICD-10-CM | POA: Diagnosis not present

## 2015-02-08 NOTE — Progress Notes (Signed)
   Subjective:    Patient ID: Eddie Fox, male    DOB: Jul 09, 1941, 74 y.o.   MRN: [id number]  Otalgia  There is pain in the right ear. The current episode started in the past 7 days ( 3 days). The problem occurs every few minutes (sharp apins in right ear.). The problem has been waxing and waning. There has been no fever. Associated symptoms include ear discharge and a sore throat. Pertinent negatives include no coughing, headaches, neck pain, rash or rhinorrhea. Associated symptoms comments: Right sided sore thrat.  Marland Kitchen He has tried acetaminophen and heat packs for the symptoms. The treatment provided moderate relief. There is no history of a chronic ear infection, hearing loss or a tympanostomy tube. has hostory of wax in ears   No sick contacts. Nonsmoker.    Review of Systems  HENT: Positive for ear discharge, ear pain and sore throat. Negative for rhinorrhea.   Respiratory: Negative for cough.   Musculoskeletal: Negative for neck pain.  Skin: Negative for rash.  Neurological: Negative for headaches.       Objective:   Physical Exam  Constitutional: He appears well-developed and well-nourished.  HENT:  Head: Normocephalic and atraumatic.  Left Ear: External ear normal.  Nose: Nose normal.  Mouth/Throat: Oropharynx is clear and moist. No oropharyngeal exudate.  Small amount of fluid in right ear behind TM. No redness, no pus, nml ear canal  Eyes: Conjunctivae and EOM are normal. Pupils are equal, round, and reactive to light.  Neck: Normal range of motion. Neck supple. No thyromegaly present.  Cardiovascular: Normal rate.   No murmur heard. Pulmonary/Chest: Effort normal and breath sounds normal. No respiratory distress. He has no wheezes. He has no rales. He exhibits no tenderness.          Assessment & Plan:

## 2015-02-08 NOTE — Patient Instructions (Signed)
Start nasal steroid spray 2 sprays per nostril daily x 1-2 weeks. Call if new symptoms or ear pressure/pain not improving.

## 2015-02-08 NOTE — Progress Notes (Signed)
Pre visit review using our clinic review tool, if applicable. No additional management support is needed unless otherwise documented below in the visit note. 

## 2015-02-08 NOTE — Assessment & Plan Note (Signed)
Treat with nasal steroid spray x 1-2 weeks. Call if not improving as expected.

## 2015-02-14 ENCOUNTER — Telehealth: Payer: Self-pay | Admitting: Family Medicine

## 2015-02-14 NOTE — Telephone Encounter (Signed)
Spoke with Mr. Dalsanto.  He is requesting the Prevnar 13 vaccine.  He states he called his insurance company to get the okay.  Nurse visit scheduled for 02/16/2015 at 2:30 pm.  Will forward to Dr. Lorelei Pont to approve since Penumococcal Vaccine is listed as an allergy in his chart.

## 2015-02-14 NOTE — Telephone Encounter (Signed)
Pt is requesting a referral to get the PNA Vaccine.

## 2015-02-14 NOTE — Telephone Encounter (Signed)
Referral is not needed for a pneumonia vaccine.  Left message for MrGehee to return my call to clarify exactly what he is requesting.

## 2015-02-16 ENCOUNTER — Ambulatory Visit (INDEPENDENT_AMBULATORY_CARE_PROVIDER_SITE_OTHER): Payer: Medicare Other | Admitting: *Deleted

## 2015-02-16 DIAGNOSIS — Z23 Encounter for immunization: Secondary | ICD-10-CM

## 2015-02-16 MED ORDER — SILDENAFIL CITRATE 20 MG PO TABS: ORAL_TABLET | ORAL | Status: DC

## 2015-02-16 NOTE — Telephone Encounter (Addendum)
Dr. Lorelei Pont spoke with Inpatient Pharmacy and they state it is okay for Eddie Fox to get the Prevnar 13 vaccine.  They did recommend having him take a benadryl and two tylenol prior to getting the vaccine.  Will give in office prior to vaccine. Lavonna Rua who will be doing nurse visits this afternoon was made aware of plan.

## 2015-02-16 NOTE — Telephone Encounter (Signed)
i am checking with the people in the hospital and pharmacy. I would delay for now until i can definitively get an answer.

## 2015-04-07 ENCOUNTER — Other Ambulatory Visit: Payer: Self-pay | Admitting: *Deleted

## 2015-04-07 MED ORDER — SILDENAFIL CITRATE 20 MG PO TABS: ORAL_TABLET | ORAL | Status: DC

## 2015-06-07 ENCOUNTER — Other Ambulatory Visit (INDEPENDENT_AMBULATORY_CARE_PROVIDER_SITE_OTHER): Payer: Medicare Other

## 2015-06-07 DIAGNOSIS — Z125 Encounter for screening for malignant neoplasm of prostate: Secondary | ICD-10-CM | POA: Diagnosis not present

## 2015-06-07 DIAGNOSIS — E559 Vitamin D deficiency, unspecified: Secondary | ICD-10-CM | POA: Diagnosis not present

## 2015-06-07 DIAGNOSIS — E785 Hyperlipidemia, unspecified: Secondary | ICD-10-CM | POA: Diagnosis not present

## 2015-06-07 DIAGNOSIS — Z1211 Encounter for screening for malignant neoplasm of colon: Secondary | ICD-10-CM | POA: Diagnosis not present

## 2015-06-07 DIAGNOSIS — Z79899 Other long term (current) drug therapy: Secondary | ICD-10-CM | POA: Diagnosis not present

## 2015-06-07 LAB — CBC WITH DIFFERENTIAL/PLATELET
Basophils Absolute: 0 10*3/uL (ref 0.0–0.1)
Basophils Relative: 0.6 % (ref 0.0–3.0)
Eosinophils Absolute: 0.6 10*3/uL (ref 0.0–0.7)
Eosinophils Relative: 9.5 % — ABNORMAL HIGH (ref 0.0–5.0)
HCT: 46.3 % (ref 39.0–52.0)
Hemoglobin: 15.4 g/dL (ref 13.0–17.0)
Lymphocytes Relative: 29 % (ref 12.0–46.0)
Lymphs Abs: 1.8 10*3/uL (ref 0.7–4.0)
MCHC: 33.3 g/dL (ref 30.0–36.0)
MCV: 94.6 fl (ref 78.0–100.0)
Monocytes Absolute: 0.5 10*3/uL (ref 0.1–1.0)
Monocytes Relative: 7.6 % (ref 3.0–12.0)
Neutro Abs: 3.3 10*3/uL (ref 1.4–7.7)
Neutrophils Relative %: 53.3 % (ref 43.0–77.0)
Platelets: 195 10*3/uL (ref 150.0–400.0)
RBC: 4.9 Mil/uL (ref 4.22–5.81)
RDW: 13.7 % (ref 11.5–15.5)
WBC: 6.2 10*3/uL (ref 4.0–10.5)

## 2015-06-07 LAB — HEPATIC FUNCTION PANEL
ALT: 20 U/L (ref 0–53)
AST: 17 U/L (ref 0–37)
Albumin: 4.5 g/dL (ref 3.5–5.2)
Alkaline Phosphatase: 39 U/L (ref 39–117)
Bilirubin, Direct: 0.1 mg/dL (ref 0.0–0.3)
Total Bilirubin: 0.6 mg/dL (ref 0.2–1.2)
Total Protein: 6.8 g/dL (ref 6.0–8.3)

## 2015-06-07 LAB — BASIC METABOLIC PANEL
BUN: 20 mg/dL (ref 6–23)
CO2: 26 mEq/L (ref 19–32)
Calcium: 9.3 mg/dL (ref 8.4–10.5)
Chloride: 109 mEq/L (ref 96–112)
Creatinine, Ser: 1.08 mg/dL (ref 0.40–1.50)
GFR: 71.21 mL/min (ref >60.00)
Glucose, Bld: 87 mg/dL (ref 70–99)
Potassium: 4.2 mEq/L (ref 3.5–5.1)
Sodium: 141 mEq/L (ref 135–145)

## 2015-06-07 LAB — LIPID PANEL
Cholesterol: 119 mg/dL (ref 0–200)
HDL: 38.8 mg/dL — ABNORMAL LOW (ref >39.00)
LDL Cholesterol: 59 mg/dL (ref 0–99)
NonHDL: 80.2
Total CHOL/HDL Ratio: 3
Triglycerides: 108 mg/dL (ref 0.0–149.0)
VLDL: 21.6 mg/dL (ref 0.0–40.0)

## 2015-06-07 LAB — VITAMIN D 25 HYDROXY (VIT D DEFICIENCY, FRACTURES): VITD: 32.76 ng/mL (ref 30.00–100.00)

## 2015-06-07 LAB — PSA, MEDICARE: PSA: 1.17 ng/ml (ref 0.10–4.00)

## 2015-06-13 ENCOUNTER — Encounter: Payer: Self-pay | Admitting: *Deleted

## 2015-06-13 ENCOUNTER — Other Ambulatory Visit (INDEPENDENT_AMBULATORY_CARE_PROVIDER_SITE_OTHER): Payer: Self-pay

## 2015-06-13 ENCOUNTER — Ambulatory Visit (INDEPENDENT_AMBULATORY_CARE_PROVIDER_SITE_OTHER): Payer: Medicare Other | Admitting: Family Medicine

## 2015-06-13 ENCOUNTER — Encounter: Payer: Self-pay | Admitting: Family Medicine

## 2015-06-13 VITALS — BP 150/60 | HR 57 | Temp 98.5°F | Ht 68.5 in | Wt 193.2 lb

## 2015-06-13 DIAGNOSIS — Z0000 Encounter for general adult medical examination without abnormal findings: Secondary | ICD-10-CM | POA: Diagnosis not present

## 2015-06-13 DIAGNOSIS — Z1211 Encounter for screening for malignant neoplasm of colon: Secondary | ICD-10-CM

## 2015-06-13 LAB — FECAL OCCULT BLOOD, IMMUNOCHEMICAL: Fecal Occult Bld: NEGATIVE

## 2015-06-13 NOTE — Progress Notes (Signed)
Pre visit review using our clinic review tool, if applicable. No additional management support is needed unless otherwise documented below in the visit note. 

## 2015-06-13 NOTE — Patient Instructions (Signed)

## 2015-06-13 NOTE — Progress Notes (Signed)
Dr. Frederico Hamman T. Breanna Shorkey, MD, [facility] [place], [address] Phone: [phone] Fax: [phone]  06/13/2015  Patient: Eddie Fox, MRN: [id number], DOB: 02-Jul-1941, 74 y.o.  Primary Physician:  Owens Loffler, MD  Chief Complaint: Annual Exam  Subjective:   Eddie Fox is a 74 y.o. pleasant patient who presents for a medicare wellness examination:  Preventative Health Maintenance Visit:  Health Maintenance Summary Reviewed and updated, unless pt declines services.  Tobacco History Reviewed. Alcohol: No concerns, no excessive use Exercise Habits: Some activity, rec at least 30 mins 5 times a week STD concerns: no risk or activity to increase risk Drug Use: None Encouraged self-testicular check  Doing well overall, needs f/u colon  Health Maintenance  Topic Date Due  . INFLUENZA VACCINE  07/18/2015  . PNA vac Low Risk Adult (2 of 2 - PPSV23) 02/16/2016  . COLONOSCOPY  03/05/2019  . TETANUS/TDAP  12/23/2019  . ZOSTAVAX  Completed    Immunization History  Administered Date(s) Administered  . Influenza Split 12/25/2011  . Influenza,inj,Quad PF,36+ Mos 08/19/2013, 08/26/2014  . Pneumococcal Conjugate-13 02/16/2015  . Td 03/24/2002, 12/22/2009    Patient Active Problem List   Diagnosis Date Noted  . 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 CHOLECYSTECTOMY, LAPAROSCOPIC, HX OF 12/28/2010  . VITAMIN D DEFICIENCY 01/30/2010  . FATTY LIVER DISEASE 08/12/2009  . ESSENTIAL HYPERTENSION 09/14/2008  . COLONIC POLYPS 07/08/2007  . HYPERLIPIDEMIA 07/08/2007  . ERECTILE DYSFUNCTION 07/08/2007  . 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 Benign neoplasm of colon   . ED (erectile dysfunction)   . Unspecified essential hypertension   . Other chronic nonalcoholic liver disease   . Other and unspecified hyperlipidemia   . Calculus of kidney   . Unspecified vitamin  D deficiency   . IBS (irritable bowel syndrome)   . Melanoma     history of   Past Surgical History  Procedure Laterality Date  . Cholecystectomy  10/03/09    lap  . 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 Laminectomy  1995    L 3-T11 Harrington rods in back  . Orif ankle fracture  1967  . Melanoma excision  1995    right upper abd, Dr. Ronnald Ramp   History   Social History  . Marital Status: Married    Spouse Name: N/A  . Number of Children: 2  . Years of Education: N/A   Occupational History  . retired     retired from [employer] 04/2002   Social History Main Topics  . Smoking status: Never Smoker   . Smokeless tobacco: Never Used  . Alcohol Use: No  . Drug Use: No  . Sexual Activity:    Partners: Female   Other Topics Concern  . Not on file   Social History Narrative   Family History  Problem Relation Age of Onset  . Heart disease Mother     CHF  . Hypertension Mother   . Diabetes Mother   . Stroke Mother   . Hypertension Father   . Heart disease Father     CHF  . Hypertension Sister   . Depression Neg Hx   . Alcohol abuse Neg Hx   . Drug abuse Neg Hx   . Hypertension Sister    Allergies  Allergen Reactions  . Pneumococcal Vaccine  REACTION: significant arm swelling, redness    Medication list has been reviewed and updated.   General: Denies fever, chills, sweats. No significant weight loss. Eyes: Denies blurring,significant itching ENT: Denies earache, sore throat, and hoarseness. Cardiovascular: Denies chest pains, palpitations, dyspnea on exertion Respiratory: Denies cough, dyspnea at rest,wheeezing Breast: no concerns about lumps GI: Denies nausea, vomiting, diarrhea, constipation, change in bowel habits, abdominal pain, melena, hematochezia GU: Denies penile discharge, ED, urinary flow / outflow problems. No STD concerns. Musculoskeletal: Denies back pain, joint pain Derm: Denies rash, itching Neuro: Denies   paresthesias, frequent falls, frequent headaches Psych: Denies depression, anxiety Endocrine: Denies cold intolerance, heat intolerance, polydipsia Heme: Denies enlarged lymph nodes Allergy: No hayfever  Objective:   BP 150/60 mmHg  Pulse 57  Temp(Src) 98.5 F (36.9 C) (Oral)  Ht 5' 8.5" (1.74 m)  Wt 193 lb 4 oz (87.658 kg)  BMI 28.95 kg/m2  The patient completed a fall screen and PHQ-2 and PHQ-9 if necessary, which is documented in the EHR. The CMA/LPN/RN who assisted the patient verbally completed with them and documented results in [facility].   Hearing Screening   Method: Audiometry   125Hz  250Hz  500Hz  1000Hz  2000Hz  4000Hz  8000Hz   Right ear:   40 0 0 0   Left ear:   0 0 0 0     Visual Acuity Screening   Right eye Left eye Both eyes  Without correction: 20/25 20/20 20/20   With correction:       GEN: well developed, well nourished, no acute distress Eyes: conjunctiva and lids normal, PERRLA, EOMI ENT: TM clear, nares clear, oral exam WNL Neck: supple, no lymphadenopathy, no thyromegaly, no JVD Pulm: clear to auscultation and percussion, respiratory effort normal CV: regular rate and rhythm, S1-S2, no murmur, rub or gallop, no bruits, peripheral pulses normal and symmetric, no cyanosis, clubbing, edema or varicosities GI: soft, non-tender; no hepatosplenomegaly, masses; active bowel sounds all quadrants GU: no hernia, testicular mass, penile discharge Lymph: no cervical, axillary or inguinal adenopathy MSK: gait normal, muscle tone and strength WNL, no joint swelling, effusions, discoloration, crepitus  SKIN: clear, good turgor, color WNL, no rashes, lesions, or ulcerations Neuro: normal mental status, normal strength, sensation, and motion Psych: alert; oriented to person, place and time, normally interactive and not anxious or depressed in appearance.  All labs reviewed with patient.  Lipids:    Component Value Date/Time   CHOL 119 06/07/2015 1018   TRIG  108.0 06/07/2015 1018   HDL 38.80* 06/07/2015 1018   LDLDIRECT 171.9 07/08/2007 1044   VLDL 21.6 06/07/2015 1018   CHOLHDL 3 06/07/2015 1018   CBC: CBC Latest Ref Rng 06/07/2015 05/12/2014 01/11/2014  WBC 4.0 - 10.5 K/uL 6.2 6.1 6.5  Hemoglobin 13.0 - 17.0 g/dL 15.4 14.8 15.3  Hematocrit 39.0 - 52.0 % 46.3 43.6 44.4  Platelets 150.0 - 400.0 K/uL 195.0 190.0 163.8    Basic Metabolic Panel:    Component Value Date/Time   NA 141 06/07/2015 1018   K 4.2 06/07/2015 1018   CL 109 06/07/2015 1018   CO2 26 06/07/2015 1018   BUN 20 06/07/2015 1018   CREATININE 1.08 06/07/2015 1018   GLUCOSE 87 06/07/2015 1018   CALCIUM 9.3 06/07/2015 1018   Hepatic Function Latest Ref Rng 06/07/2015 05/12/2014 01/11/2014  Total Protein 6.0 - 8.3 g/dL 6.8 6.6 7.2  Albumin 3.5 - 5.2 g/dL 4.5 4.2 4.4  AST 0 - 37 U/L 17 20 20   ALT 0 - 53  U/L '20 22 27  '$ Alk Phosphatase 39 - 117 U/L 39 38(L) 43  Total Bilirubin 0.2 - 1.2 mg/dL 0.6 0.8 0.5  Bilirubin, Direct 0.0 - 0.3 mg/dL 0.1 0.2 0.0    Lab Results  Component Value Date   TSH 3.27 05/12/2014   Lab Results  Component Value Date   PSA 1.17 06/07/2015   PSA 2.35 05/12/2014   PSA 1.02 05/12/2013    Assessment and Plan:   Healthcare maintenance  Special screening for malignant neoplasms, colon - Plan: CANCELED: Ambulatory referral to Gastroenterology  Health Maintenance Exam: The patient's preventative maintenance and recommended screening tests for an annual wellness exam were reviewed in full today. Brought up to date unless services declined.  Counselled on the importance of diet, exercise, and its role in overall health and mortality. The patient's FH and SH was reviewed, including their home life, tobacco status, and drug and alcohol status.  I have personally reviewed the Medicare Annual Wellness questionnaire and have noted 1. The patient's medical and social history 2. Their use of alcohol, tobacco or illicit drugs 3. Their current  medications and supplements 4. The patient's functional ability including ADL's, fall risks, home safety risks and hearing or visual             impairment. 5. Diet and physical activities 6. Evidence for depression or mood disorders 7. Reviewed Updated provider list, see scanned forms and [facility] Snapshot.   The patients weight, height, BMI and visual acuity have been recorded in the chart I have made referrals, counseling and provided education to the patient based review of the above and I have provided the pt with a written personalized care plan for preventive services.  I have provided the patient with a copy of your personalized plan for preventive services. Instructed to take the time to review along with their updated medication list.  Time for colon follow-up  Follow-up: Return in 1 year (on 06/12/2016). Or follow-up in 1 year for complete physical examination  New Prescriptions   No medications on file   No orders of the defined types were placed in this encounter.    Signed,  Maud Deed. Ornella Coderre, MD   Patient's Medications  New Prescriptions   No medications on file  Previous Medications   AMLODIPINE (NORVASC) 5 MG TABLET    TAKE ONE TABLET BY MOUTH TWICE DAILY   ASPIRIN 325 MG TABLET    Take 325 mg by mouth daily.   IBUPROFEN 200 MG CAPS    Take 600 mg by mouth as needed.   MULTIPLE VITAMINS-MINERALS (CENTRUM SILVER ULTRA MENS) TABS    Take 1 tablet by mouth daily.   SILDENAFIL (REVATIO) 20 MG TABLET    2 to 4 tablets prior to intercourse.   SIMVASTATIN (ZOCOR) 40 MG TABLET    TAKE ONE TABLET BY MOUTH ONCE DAILY EVERY NIGHT  Modified Medications   No medications on file  Discontinued Medications   No medications on file

## 2015-07-19 ENCOUNTER — Encounter: Payer: Self-pay | Admitting: Gastroenterology

## 2015-08-03 ENCOUNTER — Ambulatory Visit: Payer: Self-pay | Admitting: Family Medicine

## 2015-08-19 ENCOUNTER — Other Ambulatory Visit: Payer: Self-pay | Admitting: Family Medicine

## 2015-09-14 ENCOUNTER — Encounter: Payer: Self-pay | Admitting: Family Medicine

## 2015-09-14 ENCOUNTER — Encounter (INDEPENDENT_AMBULATORY_CARE_PROVIDER_SITE_OTHER): Payer: Self-pay

## 2015-09-14 ENCOUNTER — Ambulatory Visit (INDEPENDENT_AMBULATORY_CARE_PROVIDER_SITE_OTHER): Payer: Medicare Other | Admitting: Family Medicine

## 2015-09-14 ENCOUNTER — Ambulatory Visit: Payer: Self-pay | Admitting: Family Medicine

## 2015-09-14 VITALS — BP 130/64 | HR 56 | Temp 98.6°F | Ht 68.5 in | Wt 193.0 lb

## 2015-09-14 DIAGNOSIS — Z23 Encounter for immunization: Secondary | ICD-10-CM

## 2015-09-14 DIAGNOSIS — M7041 Prepatellar bursitis, right knee: Secondary | ICD-10-CM | POA: Diagnosis not present

## 2015-09-14 DIAGNOSIS — M79672 Pain in left foot: Secondary | ICD-10-CM | POA: Diagnosis not present

## 2015-09-14 DIAGNOSIS — B351 Tinea unguium: Secondary | ICD-10-CM

## 2015-09-14 DIAGNOSIS — M722 Plantar fascial fibromatosis: Secondary | ICD-10-CM

## 2015-09-14 MED ORDER — CICLOPIROX 8 % EX SOLN: Freq: Every day | CUTANEOUS | Status: DC

## 2015-09-14 MED ORDER — METHYLPREDNISOLONE ACETATE 40 MG/ML IJ SUSP
40.0000 mg | Freq: Once | INTRAMUSCULAR | Status: AC
  Administered 2015-09-14: 40 mg via INTRA_ARTICULAR

## 2015-09-14 NOTE — Progress Notes (Signed)
Pre visit review using our clinic review tool, if applicable. No additional management support is needed unless otherwise documented below in the visit note. 

## 2015-09-14 NOTE — Patient Instructions (Signed)

## 2015-09-14 NOTE — Progress Notes (Addendum)
Dr. Frederico Hamman T. Nathanie Ottley, MD, [facility] and Sports Medicine [place], [address] Phone: [phone] Fax: [phone]  09/14/2015  Patient: Eddie Fox, MRN: [id number], DOB: 11/23/1941, 74 y.o.  Primary Physician:  Owens Loffler, MD  Chief Complaint: Foot Pain and Joint Swelling  Subjective:   This 75 y.o. male patient presents with a several month long history of heel pain. This is notable for worsening pain first thing in the morning when arising and standing after sitting.   Prior foot or ankle fractures: none Prior operations: none Orthotics or bracing: none Medications: none PT or home rehab: none Night splints: no Ice massage: no Ball massage: no  Metatarsal pain: no  He also has had some nail fungus  The PMH, PSH, Social History, Family History, Medications, and allergies have been reviewed in [facility], and have been updated if relevant.  Patient Active Problem List   Diagnosis Date Noted  . 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 CHOLECYSTECTOMY, LAPAROSCOPIC, HX OF 12/28/2010  . VITAMIN D DEFICIENCY 01/30/2010  . FATTY LIVER DISEASE 08/12/2009  . ESSENTIAL HYPERTENSION 09/14/2008  . COLONIC POLYPS 07/08/2007  . HYPERLIPIDEMIA 07/08/2007  . ERECTILE DYSFUNCTION 07/08/2007  . 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 Benign neoplasm of colon   . ED (erectile dysfunction)   . Unspecified essential hypertension   . Other chronic nonalcoholic liver disease   . Other and unspecified hyperlipidemia   . Calculus of kidney   . Unspecified vitamin D deficiency   . IBS (irritable bowel syndrome)   . Melanoma     history of    Past Surgical History  Procedure Laterality Date  . Cholecystectomy  10/03/09    lap  . 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 Laminectomy  1995    L 3-T11 Harrington rods in back  . Orif ankle fracture  1967  . Melanoma  excision  1995    right upper abd, Dr. Ronnald Ramp    Social History   Social History  . Marital Status: Married    Spouse Name: N/A  . Number of Children: 2  . Years of Education: N/A   Occupational History  . retired     retired from [employer] 04/2002   Social History Main Topics  . Smoking status: Never Smoker   . Smokeless tobacco: Never Used  . Alcohol Use: No  . Drug Use: No  . Sexual Activity:    Partners: Female   Other Topics Concern  . Not on file   Social History Narrative    Family History  Problem Relation Age of Onset  . Heart disease Mother     CHF  . Hypertension Mother   . Diabetes Mother   . Stroke Mother   . Hypertension Father   . Heart disease Father     CHF  . Hypertension Sister   . Depression Neg Hx   . Alcohol abuse Neg Hx   . Drug abuse Neg Hx   . Hypertension Sister     Allergies  Allergen Reactions  . Pneumococcal Vaccine     REACTION: significant arm swelling, redness    Medication list reviewed and updated in full in [facility].  GEN: No fevers, chills. Nontoxic. Primarily MSK c/o today. MSK: Detailed in the HPI GI: tolerating PO intake without difficulty Neuro: No numbness, parasthesias, or  tingling associated. Otherwise the pertinent positives of the ROS are noted above.   Objective:   Blood pressure 130/64, pulse 56, temperature 98.6 F (37 C), temperature source Oral, height 5' 8.5" (1.74 m), weight 193 lb (87.544 kg).  GEN: Well-developed,well-nourished,in no acute distress; alert,appropriate and cooperative throughout examination HEENT: Normocephalic and atraumatic without obvious abnormalities. Ears, externally no deformities PULM: Breathing comfortably in no respiratory distress EXT: No clubbing, cyanosis, or edema PSYCH: Normally interactive. Cooperative during the interview. Pleasant. Friendly and conversant. Not anxious or depressed appearing. Normal, full affect.  Echymosis: no Edema: no ROM: full  LE B Gait: heel toe, non-antalgic MT pain: no Callus pattern: none Lateral Mall: NT Medial Mall: NT Talus: NT Navicular: NT Calcaneous: NT Metatarsals: NT 5th MT: NT Phalanges: NT Achilles: NT Plantar Fascia: tender, medial along PF. Pain with forced dorsi Fat Pad: NT Peroneals: NT Post Tib: NT Great Toe: Nml motion Ant Drawer: neg Other foot breakdown: none Long arch: preserved Transverse arch: preserved Hindfoot breakdown: none Sensation: intact  Assessment and Plan:   Plantar fasciitis, left  Need for prophylactic vaccination and inoculation against influenza - Plan: Flu Vaccine QUAD 36+ mos IM  Pain in heel, left - Plan: methylPREDNISolone acetate (DEPO-MEDROL) injection 40 mg  Prepatellar bursitis, right  Toenail fungus   Anatomy reviewed. Stretching and rehab are critically important to the treatment of PF. Reviewed footwear. Rigid soles have been shown to help with PF.  Reviewed rehab of stretching and calf raises.  Reviewed rehab from [facility]  Compression for prepatellar bursitis gen penlac for fungus  Plantar Fascitis Injection, LEFT Verbal consent obtained. Risks including risk of rupture, hypopigmentation, and infection reviewed in addition to benefits and alternatives were reviewed. Chloraprep used for prep. Ethyl Chloride used for anesthesia. Under sterile conditions, using the medial approach 4 cc of Lidocaine 1% and Depo-Medrol 40 mg injected superior to plantar fascia and fanned. No compications. Decreased pain post-injection.   Follow-up: prn  New Prescriptions   CICLOPIROX (PENLAC) 8 % SOLUTION    Apply topically at bedtime. Apply over nail and skin. Apply daily over previous. After seven days, may remove with alcohol and repeat   Orders Placed This Encounter  Procedures  . Flu Vaccine QUAD 36+ mos IM    Signed,  Cheri Ayotte T. Serah Nicoletti, MD   Patient's Medications  New Prescriptions   CICLOPIROX (PENLAC)  8 % SOLUTION    Apply topically at bedtime. Apply over nail and skin. Apply daily over previous. After seven days, may remove with alcohol and repeat  Previous Medications   AMLODIPINE (NORVASC) 5 MG TABLET    TAKE ONE TABLET BY MOUTH TWICE DAILY   ASPIRIN 325 MG TABLET    Take 325 mg by mouth daily.   IBUPROFEN 200 MG CAPS    Take 600 mg by mouth as needed.   MULTIPLE VITAMINS-MINERALS (CENTRUM SILVER ULTRA MENS) TABS    Take 1 tablet by mouth daily.   SILDENAFIL (REVATIO) 20 MG TABLET    2 to 4 tablets prior to intercourse.   SIMVASTATIN (ZOCOR) 40 MG TABLET    TAKE ONE TABLET BY MOUTH ONCE DAILY EVERY NIGHT  Modified Medications   No medications on file  Discontinued Medications   No medications on file

## 2015-10-25 ENCOUNTER — Other Ambulatory Visit: Payer: Self-pay | Admitting: Family Medicine

## 2015-11-08 ENCOUNTER — Telehealth: Payer: Self-pay | Admitting: Family Medicine

## 2015-11-08 NOTE — Telephone Encounter (Signed)
Pt is still experiencing left heel pain and wanted Dr. Lorelei Pont to recommend a specialist for it.  Since Dr. Lorelei Pont is not in the office, can you assist him? He doesn't need a referral, just a recommendation.  Please advise. Thank you.

## 2015-11-08 NOTE — Telephone Encounter (Signed)
I would say [facility]. Dr [name].  Dr. Loletha Grayer will be back tommorow.

## 2015-11-08 NOTE — Telephone Encounter (Signed)
Left message for [name] with the recommendation from Dr. Orlin Hilding. [name] at [facility].

## 2015-11-17 ENCOUNTER — Encounter: Payer: Self-pay | Admitting: Podiatry

## 2015-11-17 ENCOUNTER — Ambulatory Visit (INDEPENDENT_AMBULATORY_CARE_PROVIDER_SITE_OTHER): Payer: Medicare Other

## 2015-11-17 ENCOUNTER — Ambulatory Visit (INDEPENDENT_AMBULATORY_CARE_PROVIDER_SITE_OTHER): Payer: Medicare Other | Admitting: Podiatry

## 2015-11-17 DIAGNOSIS — M722 Plantar fascial fibromatosis: Secondary | ICD-10-CM | POA: Diagnosis not present

## 2015-11-17 MED ORDER — TRIAMCINOLONE ACETONIDE 10 MG/ML IJ SUSP
10.0000 mg | Freq: Once | INTRAMUSCULAR | Status: AC
  Administered 2015-11-17: 10 mg

## 2015-11-17 NOTE — Patient Instructions (Signed)

## 2015-11-17 NOTE — Progress Notes (Signed)
   Subjective:    Patient ID: Eddie Fox, male    DOB: 1941/07/27, 74 y.o.   MRN: [id number]  HPI Comments: "I have pain in the bottom of my heel and up in the arch some"  Patient presents with: Foot Pain: Plantar heel and arch left - tender for about 5 months, AM pain, Saw Dr. Edilia Bo and gave shot and recommended new shoes-referred here after treatment wasn't helping    Foot Pain      Review of Systems  Musculoskeletal: Positive for gait problem.  All other systems reviewed and are negative.      Objective:   Physical Exam        Assessment & Plan:

## 2015-11-18 NOTE — Progress Notes (Signed)
Subjective:     Patient ID: Eddie Fox, male   DOB: 09/18/1941, 74 y.o.   MRN: [id number]  HPI presents with painful heel left that's been present for several months and making it hard to walk   Review of Systems  All other systems reviewed and are negative.      Objective:   Physical Exam  Constitutional: He is oriented to person, place, and time.  Cardiovascular: Intact distal pulses.   Musculoskeletal: Normal range of motion.  Neurological: He is oriented to person, place, and time.  Skin: Skin is warm.  Nursing note and vitals reviewed.  neurovascular status intact muscle strength adequate range of motion within normal limits with patient found to have discomfort and fluid buildup in the medial fascial band left at the insertional point tendon into the calcaneus. Patient is noted to have good digital perfusion and is well oriented 3     Assessment:     Inflammatory plantar fasciitis left    Plan:     H&P x-rays reviewed condition discussed. Injected the plantar fascial left 3 mg Kenalog 5 mill grams Xylocaine and applied fascial brace with instructions on usage and reappoint

## 2015-12-01 ENCOUNTER — Encounter: Payer: Self-pay | Admitting: Podiatry

## 2015-12-01 ENCOUNTER — Ambulatory Visit (INDEPENDENT_AMBULATORY_CARE_PROVIDER_SITE_OTHER): Payer: Medicare Other | Admitting: Podiatry

## 2015-12-01 DIAGNOSIS — M722 Plantar fascial fibromatosis: Secondary | ICD-10-CM

## 2015-12-01 MED ORDER — TRIAMCINOLONE ACETONIDE 10 MG/ML IJ SUSP
10.0000 mg | Freq: Once | INTRAMUSCULAR | Status: AC
  Administered 2015-12-01: 10 mg

## 2015-12-02 NOTE — Progress Notes (Signed)
Subjective:     Patient ID: Eddie Fox, male   DOB: 12-Jan-1941, 74 y.o.   MRN: [id number]  HPI patient states I'm doing quite a bit better but still having some discomfort in my heel and arch if I been on it a lot   Review of Systems     Objective:   Physical Exam Neurovascular status intact muscle strength adequate range of motion within normal limits with discomfort plantar heel that has improved but is still present    Assessment:     Plantar fasciitis left still present but slightly improved    Plan:     Advised on physical therapy shoe gear modifications and reinjected the plantar fascia 3 mg Kenalog 5 mg Xylocaine and reviewed continued stretching

## 2016-01-16 ENCOUNTER — Telehealth: Payer: Self-pay | Admitting: *Deleted

## 2016-01-16 ENCOUNTER — Encounter: Payer: Self-pay | Admitting: Podiatry

## 2016-01-16 ENCOUNTER — Ambulatory Visit (INDEPENDENT_AMBULATORY_CARE_PROVIDER_SITE_OTHER): Payer: Medicare Other | Admitting: Podiatry

## 2016-01-16 VITALS — BP 150/66 | HR 63 | Resp 16

## 2016-01-16 DIAGNOSIS — M722 Plantar fascial fibromatosis: Secondary | ICD-10-CM

## 2016-01-16 MED ORDER — MELOXICAM 15 MG PO TABS: 15.0000 mg | ORAL_TABLET | Freq: Every day | ORAL | Status: DC

## 2016-01-16 NOTE — Telephone Encounter (Addendum)
Pt states rx not called to [facility].  Dr. [name] ordered Meloxicam 15 mg #30 one daily +2refill.  Informed pt the rx was called in.

## 2016-01-17 NOTE — Progress Notes (Signed)
Subjective:     Patient ID: Eddie Fox, male   DOB: 1941/07/17, 75 y.o.   MRN: [id number]  HPI patient presents stating I been having a lot of pain in my left heel and I thought it was improving but whenever I try to do activity it really hurts   Review of Systems     Objective:   Physical Exam Neurovascular status intact with discomfort left plantar heel at the insertional point tendon calcaneus still present deep into the heel itself    Assessment:     Acute plantar fasciitis left with inflammation fluid buildup    Plan:     At this time I've recommended that we continue to increase the type therapies were offering and I did go ahead and dispensed night splint with instructions on usage and scanned for custom orthotics to reduce plantar pressure. May require shockwave or other treatments depending on response

## 2016-01-24 DIAGNOSIS — Z85828 Personal history of other malignant neoplasm of skin: Secondary | ICD-10-CM | POA: Diagnosis not present

## 2016-01-24 DIAGNOSIS — L570 Actinic keratosis: Secondary | ICD-10-CM | POA: Diagnosis not present

## 2016-01-24 DIAGNOSIS — L821 Other seborrheic keratosis: Secondary | ICD-10-CM | POA: Diagnosis not present

## 2016-01-24 DIAGNOSIS — D1801 Hemangioma of skin and subcutaneous tissue: Secondary | ICD-10-CM | POA: Diagnosis not present

## 2016-02-06 ENCOUNTER — Encounter: Payer: Self-pay | Admitting: Podiatry

## 2016-02-06 ENCOUNTER — Ambulatory Visit (INDEPENDENT_AMBULATORY_CARE_PROVIDER_SITE_OTHER): Payer: Medicare Other | Admitting: Podiatry

## 2016-02-06 VITALS — BP 128/96 | HR 58 | Resp 16

## 2016-02-06 DIAGNOSIS — M722 Plantar fascial fibromatosis: Secondary | ICD-10-CM

## 2016-02-06 NOTE — Patient Instructions (Signed)

## 2016-02-06 NOTE — Progress Notes (Signed)
   Subjective:    Patient ID: Eddie Fox, male    DOB: 1941/03/03, 75 y.o.   MRN: [id number]  HPI PUO on 02/06/2016; pt stated, "putting more pressure on the heel".   Review of Systems     Objective:   Physical Exam        Assessment & Plan:

## 2016-02-07 NOTE — Progress Notes (Signed)
Subjective:     Patient ID: Eddie Fox, male   DOB: 1941-03-11, 75 y.o.   MRN: [id number]  HPI patient presents stating I feel like is getting more pressure on my heel but overall it seems to be somewhat improved   Review of Systems     Objective:   Physical Exam  neurovascular status intact with moderate to discomfort in the plantar heel but improved from previous visit. No other significant pathology was noted    Assessment:      plantar fasciitis present but improving    Plan:      advised on physical therapy and anti-inflammatories and reappoint to recheck again in about 6 weeks

## 2016-02-28 ENCOUNTER — Telehealth: Payer: Self-pay | Admitting: Family Medicine

## 2016-02-28 NOTE — Telephone Encounter (Signed)
Pt has appt 02/29/16 at 1:30 with Avie Echevaria NP.

## 2016-02-28 NOTE — Telephone Encounter (Signed)
Patient Name: Eddie Fox  DOB: 03/09/1941    Initial Comment Caller States husbands BP 171/80 last night, has not taken it this morning, has been going on for a few days. has bad headache, pressure feeling in his head   Nurse Assessment  Nurse: Mallie Mussel, RN, Alveta Heimlich Date/Time Eilene Ghazi Time): 02/28/2016 11:26:11 AM  Confirm and document reason for call. If symptomatic, describe symptoms. You must click the next button to save text entered. ---Patient states that his BP was 145/52 about an hour ago. His last reading was 160/78 a few minutes ago. He has a headache/pressure above his eyes. He rates the pain as 5 on 0-10 scale. He denies unilateral weakness and numbness. He takes Amlodipine BID,  Has the patient traveled out of the country within the last 30 days? ---No  Does the patient have any new or worsening symptoms? ---Yes  Will a triage be completed? ---Yes  Related visit to physician within the last 2 weeks? ---No  Does the PT have any chronic conditions? (i.e. diabetes, asthma, etc.) ---Yes  List chronic conditions. ---HTN, Hypercholesterolemia,  Is this a behavioral health or substance abuse call? ---No     Guidelines    Guideline Title Affirmed Question Affirmed Notes  High Blood Pressure [1] BP ? 140/90 AND [2] taking BP medications    Final Disposition User   See PCP within [facility], RN, Alveta Heimlich    Comments  He has been doubling up on his Amlodipine, taking 2 BID instead of prescribed 1 BID.   Referrals  REFERRED TO PCP OFFICE   Disagree/Comply: Comply

## 2016-02-29 ENCOUNTER — Ambulatory Visit (INDEPENDENT_AMBULATORY_CARE_PROVIDER_SITE_OTHER): Payer: Medicare Other | Admitting: Internal Medicine

## 2016-02-29 ENCOUNTER — Encounter: Payer: Self-pay | Admitting: Internal Medicine

## 2016-02-29 VITALS — BP 134/64 | HR 84 | Temp 98.6°F | Wt 195.5 lb

## 2016-02-29 DIAGNOSIS — I10 Essential (primary) hypertension: Secondary | ICD-10-CM

## 2016-02-29 DIAGNOSIS — R609 Edema, unspecified: Secondary | ICD-10-CM | POA: Diagnosis not present

## 2016-02-29 MED ORDER — HYDROCHLOROTHIAZIDE 12.5 MG PO CAPS: 12.5000 mg | ORAL_CAPSULE | Freq: Every day | ORAL | Status: DC

## 2016-02-29 NOTE — Patient Instructions (Signed)
Hypertension Hypertension, commonly called high blood pressure, is when the force of blood pumping through your arteries is too strong. Your arteries are the blood vessels that carry blood from your heart throughout your body. A blood pressure reading consists of a higher number over a lower number, such as 110/72. The higher number (systolic) is the pressure inside your arteries when your heart pumps. The lower number (diastolic) is the pressure inside your arteries when your heart relaxes. Ideally you want your blood pressure below 120/80. Hypertension forces your heart to work harder to pump blood. Your arteries may become narrow or stiff. Having untreated or uncontrolled hypertension can cause heart attack, stroke, kidney disease, and other problems. RISK FACTORS Some risk factors for high blood pressure are controllable. Others are not.  Risk factors you cannot control include:   Race. You may be at higher risk if you are African American.  Age. Risk increases with age.  Gender. Men are at higher risk than women before age 45 years. After age 65, women are at higher risk than men. Risk factors you can control include:  Not getting enough exercise or physical activity.  Being overweight.  Getting too much fat, sugar, calories, or salt in your diet.  Drinking too much alcohol. SIGNS AND SYMPTOMS Hypertension does not usually cause signs or symptoms. Extremely high blood pressure (hypertensive crisis) may cause headache, anxiety, shortness of breath, and nosebleed. DIAGNOSIS To check if you have hypertension, your health care provider will measure your blood pressure while you are seated, with your arm held at the level of your heart. It should be measured at least twice using the same arm. Certain conditions can cause a difference in blood pressure between your right and left arms. A blood pressure reading that is higher than normal on one occasion does not mean that you need treatment. If  it is not clear whether you have high blood pressure, you may be asked to return on a different day to have your blood pressure checked again. Or, you may be asked to monitor your blood pressure at home for 1 or more weeks. TREATMENT Treating high blood pressure includes making lifestyle changes and possibly taking medicine. Living a healthy lifestyle can help lower high blood pressure. You may need to change some of your habits. Lifestyle changes may include:  Following the DASH diet. This diet is high in fruits, vegetables, and whole grains. It is low in salt, red meat, and added sugars.  Keep your sodium intake below 2,300 mg per day.  Getting at least 30-45 minutes of aerobic exercise at least 4 times per week.  Losing weight if necessary.  Not smoking.  Limiting alcoholic beverages.  Learning ways to reduce stress. Your health care provider may prescribe medicine if lifestyle changes are not enough to get your blood pressure under control, and if one of the following is true:  You are 18-59 years of age and your systolic blood pressure is above 140.  You are 60 years of age or older, and your systolic blood pressure is above 150.  Your diastolic blood pressure is above 90.  You have diabetes, and your systolic blood pressure is over 140 or your diastolic blood pressure is over 90.  You have kidney disease and your blood pressure is above 140/90.  You have heart disease and your blood pressure is above 140/90. Your personal target blood pressure may vary depending on your medical conditions, your age, and other factors. HOME CARE INSTRUCTIONS    Have your blood pressure rechecked as directed by your health care provider.   Take medicines only as directed by your health care provider. Follow the directions carefully. Blood pressure medicines must be taken as prescribed. The medicine does not work as well when you skip doses. Skipping doses also puts you at risk for  problems.  Do not smoke.   Monitor your blood pressure at home as directed by your health care provider. SEEK MEDICAL CARE IF:   You think you are having a reaction to medicines taken.  You have recurrent headaches or feel dizzy.  You have swelling in your ankles.  You have trouble with your vision. SEEK IMMEDIATE MEDICAL CARE IF:  You develop a severe headache or confusion.  You have unusual weakness, numbness, or feel faint.  You have severe chest or abdominal pain.  You vomit repeatedly.  You have trouble breathing. MAKE SURE YOU:   Understand these instructions.  Will watch your condition.  Will get help right away if you are not doing well or get worse.   This information is not intended to replace advice given to you by your health care provider. Make sure you discuss any questions you have with your health care provider.   Document Released: 12/03/2005 Document Revised: 04/19/2015 Document Reviewed: 09/25/2013 Elsevier Interactive Patient Education 2016 [employer] Inc[employer]  

## 2016-02-29 NOTE — Progress Notes (Signed)
Pre visit review using our clinic review tool, if applicable. No additional management support is needed unless otherwise documented below in the visit note. 

## 2016-02-29 NOTE — Progress Notes (Signed)
Subjective:    Patient ID: Eddie Fox, male    DOB: 1941/08/28, 75 y.o.   MRN: [id number]  HPI  Pt presents to the clinic today with c/o elevated blood pressure. He reports his BP last night was 171/80. This morning it was 163/72. He noticed about 2 weeks ago that it was higher than normal. He started checking it, because he had been having intermittent headaches. The headaches occur in his forehead. He describes the pain as pressure. He denies dizziness, blurred vision, sensitivity to light and sound. He has been taking Amlodipine 10 mg twice daily. He denies any recent changes in diet or medication. He reports he has been under a lot of stress lately.  Review of Systems      Past Medical History  Diagnosis Date  . Benign neoplasm of colon   . ED (erectile dysfunction)   . Unspecified essential hypertension   . Other chronic nonalcoholic liver disease   . Other and unspecified hyperlipidemia   . Calculus of kidney   . Unspecified vitamin D deficiency   . IBS (irritable bowel syndrome)   . Melanoma ([place])     history of    Current Outpatient Prescriptions  Medication Sig Dispense Refill  . amLODipine (NORVASC) 5 MG tablet TAKE ONE TABLET BY MOUTH TWICE DAILY 60 tablet 5  . aspirin 325 MG tablet Take 325 mg by mouth daily.    . Ibuprofen 200 MG CAPS Take 600 mg by mouth as needed.    . meloxicam (MOBIC) 15 MG tablet Take 1 tablet (15 mg total) by mouth daily. 30 tablet 2  . Multiple Vitamins-Minerals (CENTRUM SILVER ULTRA MENS) TABS Take 1 tablet by mouth daily.    . sildenafil (REVATIO) 20 MG tablet 2 to 4 tablets prior to intercourse. 5 tablet 11  . simvastatin (ZOCOR) 40 MG tablet TAKE ONE TABLET BY MOUTH ONCE DAILY EVERY NIGHT 30 tablet 11   No current facility-administered medications for this visit.    Allergies  Allergen Reactions  . Pneumococcal Vaccine     REACTION: significant arm swelling, redness    Family History  Problem Relation Age of Onset  .  Heart disease Mother     CHF  . Hypertension Mother   . Diabetes Mother   . Stroke Mother   . Hypertension Father   . Heart disease Father     CHF  . Hypertension Sister   . Depression Neg Hx   . Alcohol abuse Neg Hx   . Drug abuse Neg Hx   . Hypertension Sister     Social History   Social History  . Marital Status: Married    Spouse Name: N/A  . Number of Children: 2  . Years of Education: N/A   Occupational History  . retired     retired from [employer] 04/2002   Social History Main Topics  . Smoking status: Never Smoker   . Smokeless tobacco: Never Used  . Alcohol Use: No  . Drug Use: No  . Sexual Activity:    Partners: Female   Other Topics Concern  . Not on file   Social History Narrative     Constitutional: Pt reports headache. Denies fever, malaise, fatigue, or abrupt weight changes.  HEENT: Denies eye pain, eye redness, ear pain, ringing in the ears, wax buildup, runny nose, nasal congestion, bloody nose, or sore throat. Respiratory: Denies difficulty breathing, shortness of breath, cough or sputum production.   Cardiovascular: Denies chest pain,  chest tightness, palpitations or swelling in the hands or feet.  Neurological: Denies dizziness, difficulty with memory, difficulty with speech or problems with balance and coordination.  Psych: Pt reports stress. Denies anxiety, depression, SI/HI.  No other specific complaints in a complete review of systems (except as listed in HPI above).  Objective:   Physical Exam   BP 134/64 mmHg  Pulse 84  Temp(Src) 98.6 F (37 C) (Oral)  Wt 195 lb 8 oz (88.678 kg)  SpO2 96%  Wt Readings from Last 3 Encounters:  02/29/16 195 lb 8 oz (88.678 kg)  09/14/15 193 lb (87.544 kg)  06/13/15 193 lb 4 oz (87.658 kg)    General: Appears his stated age, well developed, well nourished in NAD. HEENT: Head: normal shape and size; Eyes: sclera white, no icterus, conjunctiva pink, PERRLA and EOMs intact;  Cardiovascular:  Normal rate and rhythm. S1,S2 noted.  No murmur, rubs or gallops noted. 1+ pitting BLE edema. Pulmonary/Chest: Normal effort and positive vesicular breath sounds. No respiratory distress. No wheezes, rales or ronchi noted.  Neurological: Alert and oriented.    BMET    Component Value Date/Time   NA 141 06/07/2015 1018   K 4.2 06/07/2015 1018   CL 109 06/07/2015 1018   CO2 26 06/07/2015 1018   GLUCOSE 87 06/07/2015 1018   BUN 20 06/07/2015 1018   CREATININE 1.08 06/07/2015 1018   CALCIUM 9.3 06/07/2015 1018   GFRNONAA 79.04 01/25/2010 0907   GFRAA  10/03/2009 1010    >60        The eGFR has been calculated using the MDRD equation. This calculation has not been validated in all clinical situations. eGFR's persistently <60 mL/min signify possible Chronic Kidney Disease.    Lipid Panel     Component Value Date/Time   CHOL 119 06/07/2015 1018   TRIG 108.0 06/07/2015 1018   HDL 38.80* 06/07/2015 1018   CHOLHDL 3 06/07/2015 1018   VLDL 21.6 06/07/2015 1018   LDLCALC 59 06/07/2015 1018    CBC    Component Value Date/Time   WBC 6.2 06/07/2015 1018   RBC 4.90 06/07/2015 1018   HGB 15.4 06/07/2015 1018   HCT 46.3 06/07/2015 1018   PLT 195.0 06/07/2015 1018   MCV 94.6 06/07/2015 1018   MCHC 33.3 06/07/2015 1018   RDW 13.7 06/07/2015 1018   LYMPHSABS 1.8 06/07/2015 1018   MONOABS 0.5 06/07/2015 1018   EOSABS 0.6 06/07/2015 1018   BASOSABS 0.0 06/07/2015 1018    Hgb A1C No results found for: HGBA1C      Assessment & Plan:   HTN:  Advised him to go back to Amlodipine 5 mg BID (10 mg is max dose) Given edema, add in HCTZ 12.5 mg daily Encouraged him to consume a low salt diet Will check BMET in 2 weeks  RTC in 2 weeks to follow up BP

## 2016-03-14 ENCOUNTER — Ambulatory Visit: Payer: Self-pay | Admitting: Family Medicine

## 2016-03-15 ENCOUNTER — Telehealth: Payer: Self-pay | Admitting: Family Medicine

## 2016-03-15 ENCOUNTER — Ambulatory Visit: Payer: Medicare Other | Admitting: Internal Medicine

## 2016-03-15 NOTE — Telephone Encounter (Signed)
Yes, he needs to reschedule 

## 2016-03-15 NOTE — Telephone Encounter (Signed)
Patient did not come for their scheduled appointment today for 2 week follow up.  Please let me know if the patient needs to be contacted immediately for follow up or if no follow up is necessary.

## 2016-03-16 NOTE — Telephone Encounter (Signed)
4/4 appointment Pt aware

## 2016-03-20 ENCOUNTER — Ambulatory Visit (INDEPENDENT_AMBULATORY_CARE_PROVIDER_SITE_OTHER): Payer: Medicare Other | Admitting: Internal Medicine

## 2016-03-20 ENCOUNTER — Encounter: Payer: Self-pay | Admitting: Internal Medicine

## 2016-03-20 VITALS — BP 140/84 | HR 73 | Temp 98.8°F | Wt 202.8 lb

## 2016-03-20 DIAGNOSIS — I10 Essential (primary) hypertension: Secondary | ICD-10-CM

## 2016-03-20 DIAGNOSIS — R609 Edema, unspecified: Secondary | ICD-10-CM | POA: Diagnosis not present

## 2016-03-20 MED ORDER — HYDROCHLOROTHIAZIDE 25 MG PO TABS: 25.0000 mg | ORAL_TABLET | Freq: Every day | ORAL | Status: DC

## 2016-03-20 NOTE — Progress Notes (Signed)
Subjective:    Patient ID: Eddie Fox, male    DOB: [birth year], 75 y.o.   MRN: [id number]  HPI  Pt presents to the clinic today for f/u for blood pressure. He was started on 12.5 mg HCTZ 2 weeks ago in addition to 5 mg Norvasc BID. His blood pressure was 140/84 mmHg, which is increased from last visit. He continues to notice ongoing edema in his lower extremities. He denies smoking, increased caffeine intake, or NSAID use. He does not adhere to a low salt diet. Pt denies numbness, tingling, CP, chest tightness or SOB.   Review of Systems  Past Medical History  Diagnosis Date  . Benign neoplasm of colon   . ED (erectile dysfunction)   . Unspecified essential hypertension   . Other chronic nonalcoholic liver disease   . Other and unspecified hyperlipidemia   . Calculus of kidney   . Unspecified vitamin D deficiency   . IBS (irritable bowel syndrome)   . Melanoma ([place])     history of    Current Outpatient Prescriptions  Medication Sig Dispense Refill  . amLODipine (NORVASC) 5 MG tablet TAKE ONE TABLET BY MOUTH TWICE DAILY 60 tablet 5  . aspirin 325 MG tablet Take 325 mg by mouth daily.    . hydrochlorothiazide (MICROZIDE) 12.5 MG capsule Take 1 capsule (12.5 mg total) by mouth daily. 30 capsule 0  . Multiple Vitamins-Minerals (CENTRUM SILVER ULTRA MENS) TABS Take 1 tablet by mouth daily.    . sildenafil (REVATIO) 20 MG tablet 2 to 4 tablets prior to intercourse. 5 tablet 11  . simvastatin (ZOCOR) 40 MG tablet TAKE ONE TABLET BY MOUTH ONCE DAILY EVERY NIGHT 30 tablet 11   No current facility-administered medications for this visit.    Allergies  Allergen Reactions  . Pneumococcal Vaccine     REACTION: significant arm swelling, redness    Family History  Problem Relation Age of Onset  . Heart disease Mother     CHF  . Hypertension Mother   . Diabetes Mother   . Stroke Mother   . Hypertension Father   . Heart disease Father     CHF  . Hypertension Sister   .  Depression Neg Hx   . Alcohol abuse Neg Hx   . Drug abuse Neg Hx   . Hypertension Sister     Social History   Social History  . Marital Status: Married    Spouse Name: N/A  . Number of Children: 2  . Years of Education: N/A   Occupational History  . retired     retired from [employer] 04/2002   Social History Main Topics  . Smoking status: Never Smoker   . Smokeless tobacco: Never Used  . Alcohol Use: No  . Drug Use: No  . Sexual Activity:    Partners: Female   Other Topics Concern  . Not on file   Social History Narrative     Constitutional: Denies headache or abrupt weight changes.  Respiratory: Denies difficulty breathing, shortness of breath, or cough.   Cardiovascular: Pt reports swelling in his legs. Denies chest pain, chest tightness, or palpitations.  GU: Denies increase in urinary frequency during the day.  Neurological: Denies dizziness or difficulty balance and coordination.   No other specific complaints in a complete review of systems (except as listed in HPI above).     Objective:   Physical Exam BP 140/84 mmHg  Pulse 73  Temp(Src) 98.8 F (37.1 C)  Wt 202 lb 12.8 oz (91.989 kg)  SpO2 96% Wt Readings from Last 3 Encounters:  03/20/16 202 lb 12.8 oz (91.989 kg)  02/29/16 195 lb 8 oz (88.678 kg)  09/14/15 193 lb (87.544 kg)    General: Appears his stated age, well developed, well nourished in NAD. Skin: Warm, dry and intact. No rashes, lesions or ulcerations noted. HEENT: Head: normal shape and size; Neck:  Neck supple, trachea midline.  Cardiovascular: Normal rate and rhythm. S1,S2 noted.  No murmur, rubs or gallops noted. 1+ pitting, bilateral lower extremity edema.  Pulmonary/Chest: Normal effort and positive vesicular breath sounds. No respiratory distress. No wheezes, rales or ronchi noted.  Neurological: Alert and oriented.   BMET    Component Value Date/Time   NA 141 06/07/2015 1018   K 4.2 06/07/2015 1018   CL 109 06/07/2015  1018   CO2 26 06/07/2015 1018   GLUCOSE 87 06/07/2015 1018   BUN 20 06/07/2015 1018   CREATININE 1.08 06/07/2015 1018   CALCIUM 9.3 06/07/2015 1018   GFRNONAA 79.04 01/25/2010 0907   GFRAA  10/03/2009 1010    >60        The eGFR has been calculated using the MDRD equation. This calculation has not been validated in all clinical situations. eGFR's persistently <60 mL/min signify possible Chronic Kidney Disease.    Lipid Panel     Component Value Date/Time   CHOL 119 06/07/2015 1018   TRIG 108.0 06/07/2015 1018   HDL 38.80* 06/07/2015 1018   CHOLHDL 3 06/07/2015 1018   VLDL 21.6 06/07/2015 1018   LDLCALC 59 06/07/2015 1018    CBC    Component Value Date/Time   WBC 6.2 06/07/2015 1018   RBC 4.90 06/07/2015 1018   HGB 15.4 06/07/2015 1018   HCT 46.3 06/07/2015 1018   PLT 195.0 06/07/2015 1018   MCV 94.6 06/07/2015 1018   MCHC 33.3 06/07/2015 1018   RDW 13.7 06/07/2015 1018   LYMPHSABS 1.8 06/07/2015 1018   MONOABS 0.5 06/07/2015 1018   EOSABS 0.6 06/07/2015 1018   BASOSABS 0.0 06/07/2015 1018         Assessment & Plan:   Essential Hypertension:  Increase HCTZ to 25 mg daily Continue Norvasc BID Recheck blood pressure in 3 wks Decrease salt intake 

## 2016-03-20 NOTE — Progress Notes (Signed)
Pre visit review using our clinic review tool, if applicable. No additional management support is needed unless otherwise documented below in the visit note. 

## 2016-03-20 NOTE — Patient Instructions (Signed)
Hypertension Hypertension, commonly called high blood pressure, is when the force of blood pumping through your arteries is too strong. Your arteries are the blood vessels that carry blood from your heart throughout your body. A blood pressure reading consists of a higher number over a lower number, such as 110/72. The higher number (systolic) is the pressure inside your arteries when your heart pumps. The lower number (diastolic) is the pressure inside your arteries when your heart relaxes. Ideally you want your blood pressure below 120/80. Hypertension forces your heart to work harder to pump blood. Your arteries may become narrow or stiff. Having untreated or uncontrolled hypertension can cause heart attack, stroke, kidney disease, and other problems. RISK FACTORS Some risk factors for high blood pressure are controllable. Others are not.  Risk factors you cannot control include:   Race. You may be at higher risk if you are African American.  Age. Risk increases with age.  Gender. Men are at higher risk than women before age 45 years. After age 65, women are at higher risk than men. Risk factors you can control include:  Not getting enough exercise or physical activity.  Being overweight.  Getting too much fat, sugar, calories, or salt in your diet.  Drinking too much alcohol. SIGNS AND SYMPTOMS Hypertension does not usually cause signs or symptoms. Extremely high blood pressure (hypertensive crisis) may cause headache, anxiety, shortness of breath, and nosebleed. DIAGNOSIS To check if you have hypertension, your health care provider will measure your blood pressure while you are seated, with your arm held at the level of your heart. It should be measured at least twice using the same arm. Certain conditions can cause a difference in blood pressure between your right and left arms. A blood pressure reading that is higher than normal on one occasion does not mean that you need treatment. If  it is not clear whether you have high blood pressure, you may be asked to return on a different day to have your blood pressure checked again. Or, you may be asked to monitor your blood pressure at home for 1 or more weeks. TREATMENT Treating high blood pressure includes making lifestyle changes and possibly taking medicine. Living a healthy lifestyle can help lower high blood pressure. You may need to change some of your habits. Lifestyle changes may include:  Following the DASH diet. This diet is high in fruits, vegetables, and whole grains. It is low in salt, red meat, and added sugars.  Keep your sodium intake below 2,300 mg per day.  Getting at least 30-45 minutes of aerobic exercise at least 4 times per week.  Losing weight if necessary.  Not smoking.  Limiting alcoholic beverages.  Learning ways to reduce stress. Your health care provider may prescribe medicine if lifestyle changes are not enough to get your blood pressure under control, and if one of the following is true:  You are 18-59 years of age and your systolic blood pressure is above 140.  You are 60 years of age or older, and your systolic blood pressure is above 150.  Your diastolic blood pressure is above 90.  You have diabetes, and your systolic blood pressure is over 140 or your diastolic blood pressure is over 90.  You have kidney disease and your blood pressure is above 140/90.  You have heart disease and your blood pressure is above 140/90. Your personal target blood pressure may vary depending on your medical conditions, your age, and other factors. HOME CARE INSTRUCTIONS    Have your blood pressure rechecked as directed by your health care provider.   Take medicines only as directed by your health care provider. Follow the directions carefully. Blood pressure medicines must be taken as prescribed. The medicine does not work as well when you skip doses. Skipping doses also puts you at risk for  problems.  Do not smoke.   Monitor your blood pressure at home as directed by your health care provider. SEEK MEDICAL CARE IF:   You think you are having a reaction to medicines taken.  You have recurrent headaches or feel dizzy.  You have swelling in your ankles.  You have trouble with your vision. SEEK IMMEDIATE MEDICAL CARE IF:  You develop a severe headache or confusion.  You have unusual weakness, numbness, or feel faint.  You have severe chest or abdominal pain.  You vomit repeatedly.  You have trouble breathing. MAKE SURE YOU:   Understand these instructions.  Will watch your condition.  Will get help right away if you are not doing well or get worse.   This information is not intended to replace advice given to you by your health care provider. Make sure you discuss any questions you have with your health care provider.   Document Released: 12/03/2005 Document Revised: 04/19/2015 Document Reviewed: 09/25/2013 Elsevier Interactive Patient Education 2016 [employer] Inc[employer]  

## 2016-04-10 ENCOUNTER — Ambulatory Visit (INDEPENDENT_AMBULATORY_CARE_PROVIDER_SITE_OTHER): Payer: Medicare Other | Admitting: Internal Medicine

## 2016-04-10 ENCOUNTER — Encounter: Payer: Self-pay | Admitting: Internal Medicine

## 2016-04-10 VITALS — BP 118/70 | HR 57 | Temp 98.0°F | Wt 198.0 lb

## 2016-04-10 DIAGNOSIS — I10 Essential (primary) hypertension: Secondary | ICD-10-CM | POA: Diagnosis not present

## 2016-04-10 DIAGNOSIS — J309 Allergic rhinitis, unspecified: Secondary | ICD-10-CM | POA: Diagnosis not present

## 2016-04-10 MED ORDER — FLUTICASONE PROPIONATE 50 MCG/ACT NA SUSP: 2.0000 | Freq: Every day | NASAL | Status: DC

## 2016-04-10 MED ORDER — HYDROCHLOROTHIAZIDE 25 MG PO TABS: 25.0000 mg | ORAL_TABLET | Freq: Every day | ORAL | Status: DC

## 2016-04-10 MED ORDER — CETIRIZINE HCL 10 MG PO TABS: 10.0000 mg | ORAL_TABLET | Freq: Every day | ORAL | Status: DC

## 2016-04-10 NOTE — Progress Notes (Signed)
Subjective:    Patient ID: Eddie Fox, male    DOB: 1941/08/11, 75 y.o.   MRN: [id number]  HPI  Pt presents to the clinic today for a f/u blood pressure. Pt is taking Amlodipine and HCTZ. His HCTZ was doubled at his last visit. BP today is 118/70 and he reports decreased BLE edema. He denies side effects of the medicine. He denies SOB, CP, palpitations, or difficulty breathing. He denies dizziness and weakness.   Additionally, he c/o sinus pressure, rhinorrhea with clear mucous and PND causing some nausea. He said that he has had some relief with Zyrtec. He has a h/o seasonal allergies. He denies cough, fever, or sore throat. He has not had sick contacts.   Review of Systems   Past Medical History  Diagnosis Date  . Benign neoplasm of colon   . ED (erectile dysfunction)   . Unspecified essential hypertension   . Other chronic nonalcoholic liver disease   . Other and unspecified hyperlipidemia   . Calculus of kidney   . Unspecified vitamin D deficiency   . IBS (irritable bowel syndrome)   . Melanoma ([place])     history of    Current Outpatient Prescriptions  Medication Sig Dispense Refill  . amLODipine (NORVASC) 5 MG tablet TAKE ONE TABLET BY MOUTH TWICE DAILY 60 tablet 5  . aspirin 325 MG tablet Take 325 mg by mouth daily.    . hydrochlorothiazide (HYDRODIURIL) 25 MG tablet Take 1 tablet (25 mg total) by mouth daily. 30 tablet 5  . Multiple Vitamins-Minerals (CENTRUM SILVER ULTRA MENS) TABS Take 1 tablet by mouth daily.    . sildenafil (REVATIO) 20 MG tablet 2 to 4 tablets prior to intercourse. 5 tablet 11  . simvastatin (ZOCOR) 40 MG tablet TAKE ONE TABLET BY MOUTH ONCE DAILY EVERY NIGHT 30 tablet 11  . cetirizine (ZYRTEC) 10 MG tablet Take 1 tablet (10 mg total) by mouth daily. 30 tablet 11  . fluticasone (FLONASE) 50 MCG/ACT nasal spray Place 2 sprays into both nostrils daily. 16 g 6   No current facility-administered medications for this visit.    Allergies    Allergen Reactions  . Pneumococcal Vaccine     REACTION: significant arm swelling, redness    Family History  Problem Relation Age of Onset  . Heart disease Mother     CHF  . Hypertension Mother   . Diabetes Mother   . Stroke Mother   . Hypertension Father   . Heart disease Father     CHF  . Hypertension Sister   . Depression Neg Hx   . Alcohol abuse Neg Hx   . Drug abuse Neg Hx   . Hypertension Sister     Social History   Social History  . Marital Status: Married    Spouse Name: N/A  . Number of Children: 2  . Years of Education: N/A   Occupational History  . retired     retired from [employer] 04/2002   Social History Main Topics  . Smoking status: Never Smoker   . Smokeless tobacco: Never Used  . Alcohol Use: No  . Drug Use: No  . Sexual Activity:    Partners: Female   Other Topics Concern  . Not on file   Social History Narrative     Constitutional: Denies fever, fatigue, or headache  HEENT: Positive for sinus pressure, rhinorrhea with clear mucous, and PND. Denies eye pain, eye redness, ear pain, ringing in the ears, wax  buildup, bloody nose, or sore throat. Respiratory: Denies difficulty breathing, shortness of breath, cough or sputum production.   Cardiovascular: Denies chest pain, chest tightness, palpitations or swelling in the hands or feet.  Gastrointestinal: Denies abdominal pain, vomiting, constipation, diarrhea or blood in the stool.  Neurological: Denies dizziness  No other specific complaints in a complete review of systems (except as listed in HPI above).     Objective:   Physical Exam  BP 118/70 mmHg  Pulse 57  Temp(Src) 98 F (36.7 C) (Oral)  Wt 198 lb (89.812 kg)  SpO2 96% Wt Readings from Last 3 Encounters:  04/10/16 198 lb (89.812 kg)  03/20/16 202 lb 12.8 oz (91.989 kg)  02/29/16 195 lb 8 oz (88.678 kg)    General: Appears his stated age, well developed, well nourished in NAD. HEENT: Head: normal shape and size, no  sinus tenderness noted; Eyes: sclera white, no icterus, conjunctiva pink; Ears: Tm's gray and intact, normal light reflex; Nose: mucosa erythematous and inflamed, septum midline; Throat/Mouth: +PND, teeth present, mucosa erythematous and moist, no exudate, lesions or ulcerations noted.  Neck:  Neo adenopathy noted. Cardiovascular: Normal rate and rhythm. S1,S2 noted.  No murmur, rubs or gallops noted. No JVD or carotid bruits noted. No edema of L lower extremity, some edema (consistent with pt's baseline d/t injury) of R lower extremity Pulmonary/Chest: Normal effort and positive vesicular breath sounds. No respiratory distress. No wheezes, rales or ronchi noted.     BMET    Component Value Date/Time   NA 141 06/07/2015 1018   K 4.2 06/07/2015 1018   CL 109 06/07/2015 1018   CO2 26 06/07/2015 1018   GLUCOSE 87 06/07/2015 1018   BUN 20 06/07/2015 1018   CREATININE 1.08 06/07/2015 1018   CALCIUM 9.3 06/07/2015 1018   GFRNONAA 79.04 01/25/2010 0907   GFRAA  10/03/2009 1010    >60        The eGFR has been calculated using the MDRD equation. This calculation has not been validated in all clinical situations. eGFR's persistently <60 mL/min signify possible Chronic Kidney Disease.    Lipid Panel     Component Value Date/Time   CHOL 119 06/07/2015 1018   TRIG 108.0 06/07/2015 1018   HDL 38.80* 06/07/2015 1018   CHOLHDL 3 06/07/2015 1018   VLDL 21.6 06/07/2015 1018   LDLCALC 59 06/07/2015 1018    CBC    Component Value Date/Time   WBC 6.2 06/07/2015 1018   RBC 4.90 06/07/2015 1018   HGB 15.4 06/07/2015 1018   HCT 46.3 06/07/2015 1018   PLT 195.0 06/07/2015 1018   MCV 94.6 06/07/2015 1018   MCHC 33.3 06/07/2015 1018   RDW 13.7 06/07/2015 1018   LYMPHSABS 1.8 06/07/2015 1018   MONOABS 0.5 06/07/2015 1018   EOSABS 0.6 06/07/2015 1018   BASOSABS 0.0 06/07/2015 1018    Hgb A1C No results found for: HGBA1C       Assessment & Plan:   HTN:  Continue HCTZ and  Amlodipine- refills provided   Allergic rhinitis:  Start Zyrtec and Flonase daily- RX sent to pharmacy  RTC in 6 months for follow up of chronic conditions.

## 2016-04-10 NOTE — Progress Notes (Signed)
Pre visit review using our clinic review tool, if applicable. No additional management support is needed unless otherwise documented below in the visit note. 

## 2016-04-10 NOTE — Patient Instructions (Signed)
Allergic Rhinitis Allergic rhinitis is when the mucous membranes in the nose respond to allergens. Allergens are particles in the air that cause your body to have an allergic reaction. This causes you to release allergic antibodies. Through a chain of events, these eventually cause you to release histamine into the blood stream. Although meant to protect the body, it is this release of histamine that causes your discomfort, such as frequent sneezing, congestion, and an itchy, runny nose.  CAUSES Seasonal allergic rhinitis (hay fever) is caused by pollen allergens that may come from grasses, trees, and weeds. Year-round allergic rhinitis (perennial allergic rhinitis) is caused by allergens such as house dust mites, pet dander, and mold spores. SYMPTOMS  Nasal stuffiness (congestion).  Itchy, runny nose with sneezing and tearing of the eyes. DIAGNOSIS Your health care provider can help you determine the allergen or allergens that trigger your symptoms. If you and your health care provider are unable to determine the allergen, skin or blood testing may be used. Your health care provider will diagnose your condition after taking your health history and performing a physical exam. Your health care provider may assess you for other related conditions, such as asthma, pink eye, or an ear infection. TREATMENT Allergic rhinitis does not have a cure, but it can be controlled by:  Medicines that block allergy symptoms. These may include allergy shots, nasal sprays, and oral antihistamines.  Avoiding the allergen. Hay fever may often be treated with antihistamines in pill or nasal spray forms. Antihistamines block the effects of histamine. There are over-the-counter medicines that may help with nasal congestion and swelling around the eyes. Check with your health care provider before taking or giving this medicine. If avoiding the allergen or the medicine prescribed do not work, there are many new medicines  your health care provider can prescribe. Stronger medicine may be used if initial measures are ineffective. Desensitizing injections can be used if medicine and avoidance does not work. Desensitization is when a patient is given ongoing shots until the body becomes less sensitive to the allergen. Make sure you follow up with your health care provider if problems continue. HOME CARE INSTRUCTIONS It is not possible to completely avoid allergens, but you can reduce your symptoms by taking steps to limit your exposure to them. It helps to know exactly what you are allergic to so that you can avoid your specific triggers. SEEK MEDICAL CARE IF:  You have a fever.  You develop a cough that does not stop easily (persistent).  You have shortness of breath.  You start wheezing.  Symptoms interfere with normal daily activities.   This information is not intended to replace advice given to you by your health care provider. Make sure you discuss any questions you have with your health care provider.   Document Released: 08/28/2001 Document Revised: 12/24/2014 Document Reviewed: 08/10/2013 Elsevier Interactive Patient Education 2016 [employer] Inc[employer]  

## 2016-05-04 ENCOUNTER — Other Ambulatory Visit: Payer: Self-pay | Admitting: Family Medicine

## 2016-05-15 ENCOUNTER — Observation Stay (HOSPITAL_COMMUNITY)
Admission: EM | Admit: 2016-05-15 | Discharge: 2016-05-17 | Disposition: A | Discharge status: Home / Self Care | Payer: Medicare Other | Attending: General Surgery | Admitting: General Surgery

## 2016-05-15 ENCOUNTER — Telehealth: Payer: Self-pay | Admitting: Family Medicine

## 2016-05-15 ENCOUNTER — Emergency Department (HOSPITAL_COMMUNITY): Payer: Medicare Other

## 2016-05-15 ENCOUNTER — Encounter (HOSPITAL_COMMUNITY): Payer: Self-pay | Admitting: Emergency Medicine

## 2016-05-15 DIAGNOSIS — Z85820 Personal history of malignant melanoma of skin: Secondary | ICD-10-CM | POA: Diagnosis not present

## 2016-05-15 DIAGNOSIS — N200 Calculus of kidney: Secondary | ICD-10-CM | POA: Diagnosis not present

## 2016-05-15 DIAGNOSIS — K3580 Unspecified acute appendicitis: Principal | ICD-10-CM | POA: Diagnosis present

## 2016-05-15 DIAGNOSIS — K0889 Other specified disorders of teeth and supporting structures: Secondary | ICD-10-CM | POA: Insufficient documentation

## 2016-05-15 DIAGNOSIS — I10 Essential (primary) hypertension: Secondary | ICD-10-CM | POA: Diagnosis not present

## 2016-05-15 DIAGNOSIS — E785 Hyperlipidemia, unspecified: Secondary | ICD-10-CM | POA: Diagnosis not present

## 2016-05-15 DIAGNOSIS — Z7951 Long term (current) use of inhaled steroids: Secondary | ICD-10-CM | POA: Diagnosis not present

## 2016-05-15 DIAGNOSIS — K7689 Other specified diseases of liver: Secondary | ICD-10-CM | POA: Insufficient documentation

## 2016-05-15 DIAGNOSIS — Z7982 Long term (current) use of aspirin: Secondary | ICD-10-CM | POA: Diagnosis not present

## 2016-05-15 DIAGNOSIS — R109 Unspecified abdominal pain: Secondary | ICD-10-CM | POA: Diagnosis present

## 2016-05-15 DIAGNOSIS — R1031 Right lower quadrant pain: Secondary | ICD-10-CM | POA: Diagnosis not present

## 2016-05-15 LAB — COMPREHENSIVE METABOLIC PANEL
ALT: 25 U/L (ref 17–63)
AST: 20 U/L (ref 15–41)
Albumin: 4.3 g/dL (ref 3.5–5.0)
Alkaline Phosphatase: 39 U/L (ref 38–126)
Anion gap: 7 (ref 5–15)
BUN: 19 mg/dL (ref 6–20)
CO2: 28 mmol/L (ref 22–32)
Calcium: 9.7 mg/dL (ref 8.9–10.3)
Chloride: 105 mmol/L (ref 101–111)
Creatinine, Ser: 1.44 mg/dL — ABNORMAL HIGH (ref 0.61–1.24)
GFR calc Af Amer: 54 mL/min — ABNORMAL LOW (ref >60)
GFR calc non Af Amer: 46 mL/min — ABNORMAL LOW (ref >60)
Glucose, Bld: 96 mg/dL (ref 65–99)
Potassium: 3.4 mmol/L — ABNORMAL LOW (ref 3.5–5.1)
Sodium: 140 mmol/L (ref 135–145)
Total Bilirubin: 0.8 mg/dL (ref 0.3–1.2)
Total Protein: 7.2 g/dL (ref 6.5–8.1)

## 2016-05-15 LAB — URINALYSIS, ROUTINE W REFLEX MICROSCOPIC
Glucose, UA: NEGATIVE mg/dL
Hgb urine dipstick: NEGATIVE
Ketones, ur: 15 mg/dL — AB
Leukocytes, UA: NEGATIVE
Nitrite: NEGATIVE
Protein, ur: NEGATIVE mg/dL
Specific Gravity, Urine: 1.025 (ref 1.005–1.030)
pH: 5.5 (ref 5.0–8.0)

## 2016-05-15 LAB — CBC
HCT: 43.2 % (ref 39.0–52.0)
Hemoglobin: 14.9 g/dL (ref 13.0–17.0)
MCH: 31.3 pg (ref 26.0–34.0)
MCHC: 34.5 g/dL (ref 30.0–36.0)
MCV: 90.8 fL (ref 78.0–100.0)
Platelets: 169 10*3/uL (ref 150–400)
RBC: 4.76 MIL/uL (ref 4.22–5.81)
RDW: 13 % (ref 11.5–15.5)
WBC: 10 10*3/uL (ref 4.0–10.5)

## 2016-05-15 LAB — LIPASE, BLOOD: Lipase: 26 U/L (ref 11–51)

## 2016-05-15 MED ORDER — METRONIDAZOLE IN NACL 5-0.79 MG/ML-% IV SOLN
500.0000 mg | Freq: Once | INTRAVENOUS | Status: AC
  Administered 2016-05-16: 500 mg via INTRAVENOUS
  Filled 2016-05-15 (×2): qty 100

## 2016-05-15 MED ORDER — ONDANSETRON 4 MG PO TBDP: 4.0000 mg | ORAL_TABLET | Freq: Four times a day (QID) | ORAL | Status: DC | PRN

## 2016-05-15 MED ORDER — IOPAMIDOL (ISOVUE-300) INJECTION 61%
INTRAVENOUS | Status: AC
Start: 2016-05-15 — End: 2016-05-15
  Administered 2016-05-15: 100 mL
  Filled 2016-05-15: qty 100

## 2016-05-15 MED ORDER — HYDRALAZINE HCL 20 MG/ML IJ SOLN: 10.0000 mg | INTRAMUSCULAR | Status: DC | PRN

## 2016-05-15 MED ORDER — HYDROMORPHONE HCL 1 MG/ML IJ SOLN
1.0000 mg | INTRAMUSCULAR | Status: DC | PRN
  Administered 2016-05-16 (×2): 1 mg via INTRAVENOUS
  Filled 2016-05-15 (×2): qty 1

## 2016-05-15 MED ORDER — ONDANSETRON HCL 4 MG/2ML IJ SOLN
4.0000 mg | Freq: Four times a day (QID) | INTRAMUSCULAR | Status: DC | PRN
  Administered 2016-05-16 (×2): 4 mg via INTRAVENOUS
  Filled 2016-05-15: qty 2

## 2016-05-15 MED ORDER — DEXTROSE-NACL 5-0.9 % IV SOLN
INTRAVENOUS | Status: DC
  Administered 2016-05-16 (×4): via INTRAVENOUS

## 2016-05-15 MED ORDER — DEXTROSE 5 % IV SOLN
2.0000 g | Freq: Once | INTRAVENOUS | Status: AC
  Administered 2016-05-15: 2 g via INTRAVENOUS
  Filled 2016-05-15: qty 2

## 2016-05-15 MED ORDER — FENTANYL CITRATE (PF) 100 MCG/2ML IJ SOLN
50.0000 ug | Freq: Once | INTRAMUSCULAR | Status: AC
  Administered 2016-05-15: 50 ug via INTRAVENOUS
  Filled 2016-05-15: qty 2

## 2016-05-15 MED ORDER — SODIUM CHLORIDE 0.9 % IV BOLUS (SEPSIS)
500.0000 mL | Freq: Once | INTRAVENOUS | Status: AC
  Administered 2016-05-15: 500 mL via INTRAVENOUS

## 2016-05-15 NOTE — ED Notes (Signed)
Attempted to call report to 6N, RN to call me back

## 2016-05-15 NOTE — Telephone Encounter (Signed)
Patient Name: Eddie Fox  DOB: 11-05-1941    Initial Comment Caller is experiencing pain in his right side since 2am. Had a fever and chills early this morning as well. Also experienced same symptoms a couple weeks ago that lasted a couple days. Currently no fever.    Nurse Assessment  Nurse: Venetia Maxon, RN, Manuela Schwartz Date/Time (Eastern Time): 05/15/2016 3:24:38 PM  Confirm and document reason for call. If symptomatic, describe symptoms. You must click the next button to save text entered. ---Caller is experiencing pain in his right side since 2am. Had a fever and chills early this morning as well. Also experienced same symptoms a couple weeks ago that lasted a couple days. Currently no fever. He didnt check the temp at 2am . He has hx of diverticulitis . nausea / no V/D . Last BM was early this am. hard stool. no blood . Last urine was a while ago. Over night he felt like he had to urinate Q 2hrs . ( not normal for him) It hurts walking bending over and lying down even. Asked for current temp 97.7 AX pain level 7-8/10 . bending is very painful.,  Has the patient traveled out of the country within the last 30 days? ---No  Does the patient have any new or worsening symptoms? ---Yes  Will a triage be completed? ---Yes  Related visit to physician within the last 2 weeks? ---No  Does the PT have any chronic conditions? (i.e. diabetes, asthma, etc.) ---Yes  List chronic conditions. ---HTN cholesterol hx of bladder infection. diverticulitis .  Is this a behavioral health or substance abuse call? ---No     Guidelines    Guideline Title Affirmed Question Affirmed Notes  Abdominal Pain - Upper [1] Pain lasts > 10 minutes AND [2] age > 57    Final Disposition User   Go to ED Now Venetia Maxon, RN, [facility] - ED   Disagree/Comply: Comply

## 2016-05-15 NOTE — H&P (Signed)
Eddie Fox is an 75 y.o. male.   Chief Complaint: abdominal pain  HPI: 75 yo M with RLQ pain that started this AM. He states it started and localized to the RLQ.  He does state that he had similar pain 3 weeks ago that resolved on its own.  He had min appetite today. With ongoing pain he presented to the ED for further eval.  He did report subk fevers.  He state he had Cscope 5 years ago with no issues.  He has been using stool guiac/DNA samples yearly with no issues.  CT in ED reports acute appendicitis.  Past Medical History  Diagnosis Date  . Benign neoplasm of colon   . ED (erectile dysfunction)   . Unspecified essential hypertension   . Other chronic nonalcoholic liver disease   . Other and unspecified hyperlipidemia   . Calculus of kidney   . Unspecified vitamin D deficiency   . IBS (irritable bowel syndrome)   . Melanoma ([place])     history of    Past Surgical History  Procedure Laterality Date  . Cholecystectomy  10/03/09    lap  . 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 Laminectomy  1995    L 3-T11 Harrington rods in back  . Orif ankle fracture  1967  . Melanoma excision  1995    right upper abd, Dr. Ronnald Ramp    Family History  Problem Relation Age of Onset  . Heart disease Mother     CHF  . Hypertension Mother   . Diabetes Mother   . Stroke Mother   . Hypertension Father   . Heart disease Father     CHF  . Hypertension Sister   . Depression Neg Hx   . Alcohol abuse Neg Hx   . Drug abuse Neg Hx   . Hypertension Sister    Social History:  reports that he has never smoked. He has never used smokeless tobacco. He reports that he does not drink alcohol or use illicit drugs.  Allergies:  Allergies  Allergen Reactions  . Pneumococcal Vaccine     REACTION: significant arm swelling, redness     (Not in a hospital admission)  Results for orders placed or performed during the hospital encounter of 05/15/16 (from the past 48 hour(s))   Lipase, blood     Status: None   Collection Time: 05/15/16  5:25 PM  Result Value Ref Range   Lipase 26 11 - 51 U/L  Comprehensive metabolic panel     Status: Abnormal   Collection Time: 05/15/16  5:25 PM  Result Value Ref Range   Sodium 140 135 - 145 mmol/L   Potassium 3.4 (L) 3.5 - 5.1 mmol/L   Chloride 105 101 - 111 mmol/L   CO2 28 22 - 32 mmol/L   Glucose, Bld 96 65 - 99 mg/dL   BUN 19 6 - 20 mg/dL   Creatinine, Ser 1.44 (H) 0.61 - 1.24 mg/dL   Calcium 9.7 8.9 - 10.3 mg/dL   Total Protein 7.2 6.5 - 8.1 g/dL   Albumin 4.3 3.5 - 5.0 g/dL   AST 20 15 - 41 U/L   ALT 25 17 - 63 U/L   Alkaline Phosphatase 39 38 - 126 U/L   Total Bilirubin 0.8 0.3 - 1.2 mg/dL   GFR calc non Af Amer 46 (L) >60 mL/min   GFR calc Af Amer 54 (L) >60 mL/min    Comment: (  NOTE) The eGFR has been calculated using the CKD EPI equation. This calculation has not been validated in all clinical situations. eGFR's persistently <60 mL/min signify possible Chronic Kidney Disease.    Anion gap 7 5 - 15  CBC     Status: None   Collection Time: 05/15/16  5:25 PM  Result Value Ref Range   WBC 10.0 4.0 - 10.5 K/uL   RBC 4.76 4.22 - 5.81 MIL/uL   Hemoglobin 14.9 13.0 - 17.0 g/dL   HCT 43.2 39.0 - 52.0 %   MCV 90.8 78.0 - 100.0 fL   MCH 31.3 26.0 - 34.0 pg   MCHC 34.5 30.0 - 36.0 g/dL   RDW 13.0 11.5 - 15.5 %   Platelets 169 150 - 400 K/uL  Urinalysis, Routine w reflex microscopic     Status: Abnormal   Collection Time: 05/15/16  5:52 PM  Result Value Ref Range   Color, Urine AMBER (A) YELLOW    Comment: BIOCHEMICALS MAY BE AFFECTED BY COLOR   APPearance CLEAR CLEAR   Specific Gravity, Urine 1.025 1.005 - 1.030   pH 5.5 5.0 - 8.0   Glucose, UA NEGATIVE NEGATIVE mg/dL   Hgb urine dipstick NEGATIVE NEGATIVE   Bilirubin Urine SMALL (A) NEGATIVE   Ketones, ur 15 (A) NEGATIVE mg/dL   Protein, ur NEGATIVE NEGATIVE mg/dL   Nitrite NEGATIVE NEGATIVE   Leukocytes, UA NEGATIVE NEGATIVE    Comment: MICROSCOPIC  NOT DONE ON URINES WITH NEGATIVE PROTEIN, BLOOD, LEUKOCYTES, NITRITE, OR GLUCOSE <1000 mg/dL.   Ct Abdomen Pelvis W Contrast  05/15/2016  CLINICAL DATA:  Acute onset of right lower quadrant abdominal pain and nausea. Initial encounter. EXAM: CT ABDOMEN AND PELVIS WITH CONTRAST TECHNIQUE: Multidetector CT imaging of the abdomen and pelvis was performed using the standard protocol following bolus administration of intravenous contrast. CONTRAST:  31m ISOVUE-300 IOPAMIDOL (ISOVUE-300) INJECTION 61% COMPARISON:  CT of the abdomen and pelvis from 01/13/2014 FINDINGS: Minimal bibasilar atelectasis is noted. Scattered coronary artery calcification is seen. The liver and spleen are unremarkable in appearance. The patient is status post cholecystectomy, with clips noted at the gallbladder fossa. The pancreas and adrenal glands are unremarkable. Bilateral nonobstructing renal stones measure up to 9 mm in size. Small bilateral renal cysts are seen. There is no evidence of hydronephrosis. No obstructing ureteral stones are seen. Mild nonspecific perinephric stranding is noted bilaterally. No free fluid is identified. The small bowel is unremarkable in appearance. The stomach is within normal limits. No acute vascular abnormalities are seen. Scattered calcification is seen along the abdominal aorta and its branches. The appendix is dilated to 1.6 cm, with surrounding soft tissue inflammation and trace fluid, concerning for acute appendicitis. There is no evidence of perforation or abscess formation at this time. An adjacent prominent node is seen. Scattered diverticulosis is noted along the descending and sigmoid colon, without evidence of diverticulitis. The bladder is mildly distended and grossly unremarkable. The prostate is borderline normal in size. No inguinal lymphadenopathy is seen. No acute osseous abnormalities are identified. The patient is status post lumbar spinal fusion at T11-L3, with mild chronic compression  deformity at L1. Mild vacuum phenomenon is noted along the lower lumbar spine. IMPRESSION: 1. Acute appendicitis, with dilatation of the appendix to 1.6 cm in diameter, and surrounding soft tissue inflammation and trace fluid. Adjacent prominent nodes seen. No evidence of perforation or abscess formation at this time. 2. Scattered coronary artery calcifications seen. 3. Bilateral nonobstructing renal stones measure up to 9  mm in size. 4. Small bilateral renal cysts seen. 5. Scattered diverticulosis along the descending and sigmoid colon, without evidence of diverticulitis. 6. Mild degenerative change along the lower lumbar spine. Status post lumbar spinal fusion at T11-L3, with mild chronic compression deformity at L1. These results were called by telephone at the time of interpretation on 05/15/2016 at 10:08 pm to Dr. Quintella Reichert, who verbally acknowledged these results. Electronically Signed   By: Garald Balding M.D.   On: 05/15/2016 22:09    Review of Systems  Constitutional: Positive for fever. Negative for chills and weight loss.  HENT: Negative for hearing loss and tinnitus.   Eyes: Negative for blurred vision, double vision and photophobia.  Respiratory: Negative for cough, hemoptysis and sputum production.   Cardiovascular: Negative for chest pain, palpitations and orthopnea.  Gastrointestinal: Positive for nausea and abdominal pain. Negative for heartburn, vomiting, diarrhea, constipation, blood in stool and melena.  Genitourinary: Negative for dysuria, urgency and frequency.  Musculoskeletal: Negative for myalgias, back pain and neck pain.  Skin: Negative for itching and rash.  Neurological: Negative for dizziness, tingling, tremors and headaches.    Blood pressure 141/56, pulse 68, temperature 98.9 F (37.2 C), temperature source Oral, resp. rate 18, weight 87.68 kg (193 lb 4.8 oz), SpO2 95 %. Physical Exam  Constitutional: He is oriented to person, place, and time. He appears  well-developed and well-nourished.  HENT:  Head: Normocephalic and atraumatic.  Eyes: Conjunctivae and EOM are normal. Pupils are equal, round, and reactive to light.  Neck: Normal range of motion. Neck supple. No tracheal deviation present. No thyromegaly present.  Cardiovascular: Normal rate, regular rhythm and normal heart sounds.   Respiratory: Effort normal and breath sounds normal.  GI: Soft. Bowel sounds are normal. He exhibits no distension. There is tenderness (RLQ). There is tenderness at McBurney's point. There is no rigidity, no rebound and no guarding.  Musculoskeletal: Normal range of motion. He exhibits no tenderness.  Neurological: He is alert and oriented to person, place, and time.  Skin: Skin is warm and dry.     Assessment/Plan 75 yo M with acute appendicitis HTN HLD  1. Admit, NPO, consent for lap appy by Dr. Redmond Pulling in AM 2. IV abx    Reyes Ivan, MD 05/15/2016, 11:02 PM

## 2016-05-15 NOTE — Telephone Encounter (Signed)
Patient did not report to ED as advised.  He has appointment here to evaluate symptoms tomorrow 5/31 with Dr. Lorelei Pont.

## 2016-05-15 NOTE — ED Provider Notes (Signed)
CSN: [id number]     Arrival date & time 05/15/16  1640 History   First MD Initiated Contact with Patient 05/15/16 1958     Chief Complaint  Patient presents with  . Abdominal Pain     Patient is a 75 y.o. male presenting with abdominal pain. The history is provided by the patient. No language interpreter was used.  Abdominal Pain  Eddie Fox is a 75 y.o. male who presents to the Emergency Department complaining of abdominal pain.  Current abdominal pain that started at 2 AM this morning. The pain woke him from sleep. The pain is described as a evidence constant sore sensation. He does have associated intermittent right upper quadrant sharp pain that radiates down to his right lower quadrant. The pain is worse with movement. He has associated nausea and decreased appetite. He felt feverish and warm this morning but did not check his temperature. He denies any vomiting, diarrhea, dysuria. He did have frequent urination last night, now resolved. Kidney stones that required intervention in the past but this does not feel like his prior kidney stones.  Past Medical History  Diagnosis Date  . Benign neoplasm of colon   . ED (erectile dysfunction)   . Unspecified essential hypertension   . Other chronic nonalcoholic liver disease   . Other and unspecified hyperlipidemia   . Calculus of kidney   . Unspecified vitamin D deficiency   . IBS (irritable bowel syndrome)   . Melanoma ([place])     history of   Past Surgical History  Procedure Laterality Date  . Cholecystectomy  10/03/09    lap  . 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 Laminectomy  1995    L 3-T11 Harrington rods in back  . Orif ankle fracture  1967  . Melanoma excision  1995    right upper abd, Dr. Ronnald Ramp   Family History  Problem Relation Age of Onset  . Heart disease Mother     CHF  . Hypertension Mother   . Diabetes Mother   . Stroke Mother   . Hypertension Father   . Heart disease Father    CHF  . Hypertension Sister   . Depression Neg Hx   . Alcohol abuse Neg Hx   . Drug abuse Neg Hx   . Hypertension Sister    Social History  Substance Use Topics  . Smoking status: Never Smoker   . Smokeless tobacco: Never Used  . Alcohol Use: No    Review of Systems  Gastrointestinal: Positive for abdominal pain.  All other systems reviewed and are negative.     Allergies  Pneumococcal vaccine  Home Medications   Prior to Admission medications   Medication Sig Start Date End Date Taking? Authorizing Provider  amLODipine (NORVASC) 5 MG tablet TAKE ONE TABLET BY MOUTH TWICE DAILY 10/26/15  Yes Owens Loffler, MD  aspirin 325 MG tablet Take 325 mg by mouth daily.   Yes Historical Provider, MD  cetirizine (ZYRTEC) 10 MG tablet Take 1 tablet (10 mg total) by mouth daily. 04/10/16  Yes Jearld Fenton, NP  fluticasone (FLONASE) 50 MCG/ACT nasal spray Place 2 sprays into both nostrils daily. 04/10/16  Yes Jearld Fenton, NP  hydrochlorothiazide (HYDRODIURIL) 25 MG tablet Take 1 tablet (25 mg total) by mouth daily. 04/10/16  Yes Jearld Fenton, NP  Multiple Vitamins-Minerals (CENTRUM SILVER ULTRA MENS) TABS Take 1 tablet by mouth daily.   Yes Historical  Provider, MD  simvastatin (ZOCOR) 40 MG tablet TAKE ONE TABLET BY MOUTH ONCE DAILY EVERY NIGHT 08/19/15  Yes Spencer Copland, MD  sildenafil (REVATIO) 20 MG tablet TAKE 2 TO 4 TABLETS PRIOR TO INTERCOURSE Patient not taking: Reported on 05/15/2016 05/04/16   Frederico Hamman Copland, MD   BP 141/56 mmHg  Pulse 68  Temp(Src) 98.9 F (37.2 C) (Oral)  Resp 18  Wt 193 lb 4.8 oz (87.68 kg)  SpO2 95% Physical Exam  Constitutional: He is oriented to person, place, and time. He appears well-developed and well-nourished.  HENT:  Head: Normocephalic and atraumatic.  Cardiovascular: Normal rate and regular rhythm.   No murmur heard. Pulmonary/Chest: Effort normal and breath sounds normal. No respiratory distress.  Abdominal: Soft. There is no rebound  and no guarding.  Mild to moderate right-sided abdominal tenderness, greatest in the right lower quadrant. No guarding or rebound.  Musculoskeletal: He exhibits no edema or tenderness.  Neurological: He is alert and oriented to person, place, and time.  Skin: Skin is warm and dry.  Psychiatric: He has a normal mood and affect. His behavior is normal.  Nursing note and vitals reviewed.   ED Course  Procedures (including critical care time) Labs Review Labs Reviewed  COMPREHENSIVE METABOLIC PANEL - Abnormal; Notable for the following:    Potassium 3.4 (*)    Creatinine, Ser 1.44 (*)    GFR calc non Af Amer 46 (*)    GFR calc Af Amer 54 (*)    All other components within normal limits  URINALYSIS, ROUTINE W REFLEX MICROSCOPIC (NOT AT [facility]) - Abnormal; Notable for the following:    Color, Urine AMBER (*)    Bilirubin Urine SMALL (*)    Ketones, ur 15 (*)    All other components within normal limits  LIPASE, BLOOD  CBC    Imaging Review Ct Abdomen Pelvis W Contrast  05/15/2016  CLINICAL DATA:  Acute onset of right lower quadrant abdominal pain and nausea. Initial encounter. EXAM: CT ABDOMEN AND PELVIS WITH CONTRAST TECHNIQUE: Multidetector CT imaging of the abdomen and pelvis was performed using the standard protocol following bolus administration of intravenous contrast. CONTRAST:  70mL ISOVUE-300 IOPAMIDOL (ISOVUE-300) INJECTION 61% COMPARISON:  CT of the abdomen and pelvis from 01/13/2014 FINDINGS: Minimal bibasilar atelectasis is noted. Scattered coronary artery calcification is seen. The liver and spleen are unremarkable in appearance. The patient is status post cholecystectomy, with clips noted at the gallbladder fossa. The pancreas and adrenal glands are unremarkable. Bilateral nonobstructing renal stones measure up to 9 mm in size. Small bilateral renal cysts are seen. There is no evidence of hydronephrosis. No obstructing ureteral stones are seen. Mild nonspecific perinephric  stranding is noted bilaterally. No free fluid is identified. The small bowel is unremarkable in appearance. The stomach is within normal limits. No acute vascular abnormalities are seen. Scattered calcification is seen along the abdominal aorta and its branches. The appendix is dilated to 1.6 cm, with surrounding soft tissue inflammation and trace fluid, concerning for acute appendicitis. There is no evidence of perforation or abscess formation at this time. An adjacent prominent node is seen. Scattered diverticulosis is noted along the descending and sigmoid colon, without evidence of diverticulitis. The bladder is mildly distended and grossly unremarkable. The prostate is borderline normal in size. No inguinal lymphadenopathy is seen. No acute osseous abnormalities are identified. The patient is status post lumbar spinal fusion at T11-L3, with mild chronic compression deformity at L1. Mild vacuum phenomenon is noted along the  lower lumbar spine. IMPRESSION: 1. Acute appendicitis, with dilatation of the appendix to 1.6 cm in diameter, and surrounding soft tissue inflammation and trace fluid. Adjacent prominent nodes seen. No evidence of perforation or abscess formation at this time. 2. Scattered coronary artery calcifications seen. 3. Bilateral nonobstructing renal stones measure up to 9 mm in size. 4. Small bilateral renal cysts seen. 5. Scattered diverticulosis along the descending and sigmoid colon, without evidence of diverticulitis. 6. Mild degenerative change along the lower lumbar spine. Status post lumbar spinal fusion at T11-L3, with mild chronic compression deformity at L1. These results were called by telephone at the time of interpretation on 05/15/2016 at 10:08 pm to Dr. Quintella Reichert, who verbally acknowledged these results. Electronically Signed   By: Garald Balding M.D.   On: 05/15/2016 22:09   I have personally reviewed and evaluated these images and lab results as part of my medical  decision-making.   EKG Interpretation None      MDM   Final diagnoses:  Acute appendicitis, unspecified acute appendicitis type   Patient here for progressive right lower quadrant abdominal pain that started earlier today. He is in no distress in the emergency department. CT scan demonstrates acute appendicitis without any complicating features. Antibiotics started in the ED.  Dr. Rosendo Gros with surgery consulted and evaluated the patient in the emergency department and will admit.    Quintella Reichert, MD 05/15/16 [phone]

## 2016-05-15 NOTE — ED Notes (Signed)
Pt sts RLQ pain starting at 0200 today that is severe in nature

## 2016-05-16 ENCOUNTER — Encounter (HOSPITAL_COMMUNITY)
Admission: EM | Disposition: A | Discharge status: Home / Self Care | Payer: Self-pay | Source: Home / Self Care | Attending: Emergency Medicine

## 2016-05-16 ENCOUNTER — Ambulatory Visit: Payer: Self-pay | Admitting: Family Medicine

## 2016-05-16 ENCOUNTER — Observation Stay (HOSPITAL_COMMUNITY): Payer: Medicare Other | Admitting: Certified Registered"

## 2016-05-16 ENCOUNTER — Encounter (HOSPITAL_COMMUNITY): Payer: Self-pay

## 2016-05-16 DIAGNOSIS — K7689 Other specified diseases of liver: Secondary | ICD-10-CM | POA: Diagnosis not present

## 2016-05-16 DIAGNOSIS — E785 Hyperlipidemia, unspecified: Secondary | ICD-10-CM | POA: Diagnosis not present

## 2016-05-16 DIAGNOSIS — D3A020 Benign carcinoid tumor of the appendix: Secondary | ICD-10-CM | POA: Diagnosis not present

## 2016-05-16 DIAGNOSIS — I10 Essential (primary) hypertension: Secondary | ICD-10-CM | POA: Diagnosis not present

## 2016-05-16 DIAGNOSIS — K589 Irritable bowel syndrome without diarrhea: Secondary | ICD-10-CM | POA: Diagnosis not present

## 2016-05-16 DIAGNOSIS — K3580 Unspecified acute appendicitis: Secondary | ICD-10-CM | POA: Diagnosis not present

## 2016-05-16 HISTORY — PX: LAPAROSCOPIC APPENDECTOMY: SHX408

## 2016-05-16 LAB — SURGICAL PCR SCREEN
MRSA, PCR: NEGATIVE
Staphylococcus aureus: NEGATIVE

## 2016-05-16 SURGERY — APPENDECTOMY, LAPAROSCOPIC
Anesthesia: General | Site: Abdomen

## 2016-05-16 MED ORDER — SUGAMMADEX SODIUM 200 MG/2ML IV SOLN
INTRAVENOUS | Status: DC | PRN
  Administered 2016-05-16: 200 mg via INTRAVENOUS

## 2016-05-16 MED ORDER — SUCCINYLCHOLINE CHLORIDE 20 MG/ML IJ SOLN
INTRAMUSCULAR | Status: DC | PRN
  Administered 2016-05-16: 120 mg via INTRAVENOUS

## 2016-05-16 MED ORDER — 0.9 % SODIUM CHLORIDE (POUR BTL) OPTIME
TOPICAL | Status: DC | PRN
  Administered 2016-05-16: 1000 mL

## 2016-05-16 MED ORDER — AMLODIPINE BESYLATE 5 MG PO TABS
5.0000 mg | ORAL_TABLET | Freq: Two times a day (BID) | ORAL | Status: DC
  Administered 2016-05-16 – 2016-05-17 (×3): 5 mg via ORAL
  Filled 2016-05-16 (×3): qty 1

## 2016-05-16 MED ORDER — HEPARIN SODIUM (PORCINE) 5000 UNIT/ML IJ SOLN
5000.0000 [IU] | Freq: Three times a day (TID) | INTRAMUSCULAR | Status: DC
  Administered 2016-05-17 (×2): 5000 [IU] via SUBCUTANEOUS
  Filled 2016-05-16 (×2): qty 1

## 2016-05-16 MED ORDER — ROCURONIUM BROMIDE 100 MG/10ML IV SOLN
INTRAVENOUS | Status: DC | PRN
  Administered 2016-05-16: 25 mg via INTRAVENOUS

## 2016-05-16 MED ORDER — FENTANYL CITRATE (PF) 250 MCG/5ML IJ SOLN
INTRAMUSCULAR | Status: AC
  Filled 2016-05-16: qty 5

## 2016-05-16 MED ORDER — LIDOCAINE HCL (CARDIAC) 20 MG/ML IV SOLN
INTRAVENOUS | Status: DC | PRN
  Administered 2016-05-16: 60 mg via INTRAVENOUS

## 2016-05-16 MED ORDER — ROCURONIUM BROMIDE 50 MG/5ML IV SOLN
INTRAVENOUS | Status: AC
  Filled 2016-05-16: qty 1

## 2016-05-16 MED ORDER — ACETAMINOPHEN 500 MG PO TABS
1000.0000 mg | ORAL_TABLET | Freq: Four times a day (QID) | ORAL | Status: DC
  Administered 2016-05-16 – 2016-05-17 (×3): 1000 mg via ORAL
  Filled 2016-05-16 (×4): qty 2

## 2016-05-16 MED ORDER — SODIUM CHLORIDE 0.9 % IV BOLUS (SEPSIS)
500.0000 mL | Freq: Once | INTRAVENOUS | Status: AC
  Administered 2016-05-16: 500 mL via INTRAVENOUS

## 2016-05-16 MED ORDER — PROCHLORPERAZINE EDISYLATE 5 MG/ML IJ SOLN
10.0000 mg | Freq: Four times a day (QID) | INTRAMUSCULAR | Status: DC | PRN
  Administered 2016-05-16: 10 mg via INTRAVENOUS
  Filled 2016-05-16 (×2): qty 2

## 2016-05-16 MED ORDER — DIPHENHYDRAMINE HCL 50 MG/ML IJ SOLN: 12.5000 mg | Freq: Four times a day (QID) | INTRAMUSCULAR | Status: DC | PRN

## 2016-05-16 MED ORDER — PHENYLEPHRINE 40 MCG/ML (10ML) SYRINGE FOR IV PUSH (FOR BLOOD PRESSURE SUPPORT)
PREFILLED_SYRINGE | INTRAVENOUS | Status: AC
  Filled 2016-05-16: qty 10

## 2016-05-16 MED ORDER — FENTANYL CITRATE (PF) 100 MCG/2ML IJ SOLN
INTRAMUSCULAR | Status: DC | PRN
  Administered 2016-05-16: 100 ug via INTRAVENOUS
  Administered 2016-05-16: 50 ug via INTRAVENOUS

## 2016-05-16 MED ORDER — DIPHENHYDRAMINE HCL 12.5 MG/5ML PO ELIX: 12.5000 mg | ORAL_SOLUTION | Freq: Four times a day (QID) | ORAL | Status: DC | PRN

## 2016-05-16 MED ORDER — BUPIVACAINE-EPINEPHRINE 0.25% -1:200000 IJ SOLN
INTRAMUSCULAR | Status: DC | PRN
  Administered 2016-05-16: 30 mL

## 2016-05-16 MED ORDER — HYDROMORPHONE HCL 1 MG/ML IJ SOLN: 0.2500 mg | INTRAMUSCULAR | Status: DC | PRN

## 2016-05-16 MED ORDER — LACTATED RINGERS IV SOLN
INTRAVENOUS | Status: DC | PRN
  Administered 2016-05-16: 08:00:00 via INTRAVENOUS

## 2016-05-16 MED ORDER — DEXAMETHASONE SODIUM PHOSPHATE 10 MG/ML IJ SOLN
INTRAMUSCULAR | Status: AC
  Filled 2016-05-16: qty 1

## 2016-05-16 MED ORDER — SODIUM CHLORIDE 0.9 % IR SOLN
Status: DC | PRN
  Administered 2016-05-16: 1000 mL

## 2016-05-16 MED ORDER — PROPOFOL 10 MG/ML IV BOLUS
INTRAVENOUS | Status: DC | PRN
  Administered 2016-05-16: 30 mg via INTRAVENOUS
  Administered 2016-05-16: 150 mg via INTRAVENOUS

## 2016-05-16 MED ORDER — BUPIVACAINE-EPINEPHRINE (PF) 0.25% -1:200000 IJ SOLN
INTRAMUSCULAR | Status: AC
  Filled 2016-05-16: qty 30

## 2016-05-16 MED ORDER — PROPOFOL 10 MG/ML IV BOLUS
INTRAVENOUS | Status: AC
  Filled 2016-05-16: qty 20

## 2016-05-16 MED ORDER — SUGAMMADEX SODIUM 200 MG/2ML IV SOLN
INTRAVENOUS | Status: AC
  Filled 2016-05-16: qty 2

## 2016-05-16 MED ORDER — DEXAMETHASONE SODIUM PHOSPHATE 10 MG/ML IJ SOLN
INTRAMUSCULAR | Status: DC | PRN
  Administered 2016-05-16: 10 mg via INTRAVENOUS

## 2016-05-16 MED ORDER — MORPHINE SULFATE (PF) 2 MG/ML IV SOLN
1.0000 mg | INTRAVENOUS | Status: DC | PRN
  Administered 2016-05-16: 2 mg via INTRAVENOUS
  Filled 2016-05-16: qty 1

## 2016-05-16 MED ORDER — ONDANSETRON HCL 4 MG/2ML IJ SOLN
INTRAMUSCULAR | Status: AC
  Filled 2016-05-16: qty 2

## 2016-05-16 MED ORDER — MIDAZOLAM HCL 5 MG/5ML IJ SOLN: INTRAMUSCULAR | Status: DC | PRN

## 2016-05-16 MED ORDER — METHOCARBAMOL 500 MG PO TABS: 500.0000 mg | ORAL_TABLET | Freq: Four times a day (QID) | ORAL | Status: DC | PRN

## 2016-05-16 MED ORDER — OXYCODONE HCL 5 MG PO TABS
5.0000 mg | ORAL_TABLET | ORAL | Status: DC | PRN
  Administered 2016-05-17: 10 mg via ORAL
  Filled 2016-05-16: qty 2

## 2016-05-16 MED ORDER — EPHEDRINE SULFATE 50 MG/ML IJ SOLN
INTRAMUSCULAR | Status: DC | PRN
  Administered 2016-05-16: 10 mg via INTRAVENOUS

## 2016-05-16 SURGICAL SUPPLY — 56 items
APPLIER CLIP ROT 10 11.4 M/L (STAPLE)
BANDAGE ADH SHEER 1  50/CT (GAUZE/BANDAGES/DRESSINGS) ×4 IMPLANT
BENZOIN TINCTURE PRP APPL 2/3 (GAUZE/BANDAGES/DRESSINGS) ×2 IMPLANT
BLADE SURG ROTATE 9660 (MISCELLANEOUS) ×2 IMPLANT
CANISTER SUCTION 2500CC (MISCELLANEOUS) ×2 IMPLANT
CHLORAPREP W/TINT 26ML (MISCELLANEOUS) ×2 IMPLANT
CLIP APPLIE ROT 10 11.4 M/L (STAPLE) IMPLANT
COVER SURGICAL LIGHT HANDLE (MISCELLANEOUS) ×2 IMPLANT
CUTTER FLEX LINEAR 45M (STAPLE) ×2 IMPLANT
DECANTER SPIKE VIAL GLASS SM (MISCELLANEOUS) ×2 IMPLANT
DERMABOND ADVANCED (GAUZE/BANDAGES/DRESSINGS)
DERMABOND ADVANCED .7 DNX12 (GAUZE/BANDAGES/DRESSINGS) IMPLANT
DRSG TEGADERM 4X4.75 (GAUZE/BANDAGES/DRESSINGS) ×2 IMPLANT
ELECT CAUTERY BLADE 6.4 (BLADE) ×2 IMPLANT
ELECT REM PT RETURN 9FT ADLT (ELECTROSURGICAL) ×2
ELECTRODE REM PT RTRN 9FT ADLT (ELECTROSURGICAL) ×1 IMPLANT
ENDOLOOP SUT PDS II  0 18 (SUTURE)
ENDOLOOP SUT PDS II 0 18 (SUTURE) IMPLANT
GAUZE SPONGE 2X2 8PLY STRL LF (GAUZE/BANDAGES/DRESSINGS) ×1 IMPLANT
GLOVE BIOGEL M STRL SZ7.5 (GLOVE) ×4 IMPLANT
GLOVE BIOGEL PI IND STRL 7.0 (GLOVE) ×1 IMPLANT
GLOVE BIOGEL PI IND STRL 8 (GLOVE) ×2 IMPLANT
GLOVE BIOGEL PI INDICATOR 7.0 (GLOVE) ×1
GLOVE BIOGEL PI INDICATOR 8 (GLOVE) ×2
GLOVE SS BIOGEL STRL SZ 8 (GLOVE) ×2 IMPLANT
GLOVE SUPERSENSE BIOGEL SZ 8 (GLOVE) ×2
GLOVE SURG SS PI 7.0 STRL IVOR (GLOVE) ×2 IMPLANT
GOWN STRL REUS W/ TWL LRG LVL3 (GOWN DISPOSABLE) ×2 IMPLANT
GOWN STRL REUS W/ TWL XL LVL3 (GOWN DISPOSABLE) ×1 IMPLANT
GOWN STRL REUS W/TWL LRG LVL3 (GOWN DISPOSABLE) ×2
GOWN STRL REUS W/TWL XL LVL3 (GOWN DISPOSABLE) ×1
KIT BASIN OR (CUSTOM PROCEDURE TRAY) ×2 IMPLANT
KIT ROOM TURNOVER OR (KITS) ×2 IMPLANT
NS IRRIG 1000ML POUR BTL (IV SOLUTION) ×2 IMPLANT
PAD ARMBOARD 7.5X6 YLW CONV (MISCELLANEOUS) ×4 IMPLANT
PENCIL BUTTON HOLSTER BLD 10FT (ELECTRODE) ×2 IMPLANT
POUCH RETRIEVAL ECOSAC 10 (ENDOMECHANICALS) ×1 IMPLANT
POUCH RETRIEVAL ECOSAC 10MM (ENDOMECHANICALS) ×1
RELOAD 45 VASCULAR/THIN (ENDOMECHANICALS) ×2 IMPLANT
RELOAD STAPLE TA45 3.5 REG BLU (ENDOMECHANICALS) IMPLANT
SCALPEL HARMONIC ACE (MISCELLANEOUS) ×2 IMPLANT
SCISSORS LAP 5X35 DISP (ENDOMECHANICALS) ×2 IMPLANT
SET IRRIG TUBING LAPAROSCOPIC (IRRIGATION / IRRIGATOR) ×2 IMPLANT
SPECIMEN JAR SMALL (MISCELLANEOUS) ×2 IMPLANT
SPONGE GAUZE 2X2 STER 10/PKG (GAUZE/BANDAGES/DRESSINGS) ×1
STRIP CLOSURE SKIN 1/2X4 (GAUZE/BANDAGES/DRESSINGS) ×2 IMPLANT
SUT MNCRL AB 4-0 PS2 18 (SUTURE) ×2 IMPLANT
SUT VIC AB 3-0 SH 18 (SUTURE) ×2 IMPLANT
SUT VICRYL 0 UR6 27IN ABS (SUTURE) ×2 IMPLANT
TOWEL OR 17X24 6PK STRL BLUE (TOWEL DISPOSABLE) ×2 IMPLANT
TOWEL OR 17X26 10 PK STRL BLUE (TOWEL DISPOSABLE) IMPLANT
TRAY FOLEY CATH 16FR SILVER (SET/KITS/TRAYS/PACK) ×2 IMPLANT
TRAY LAPAROSCOPIC MC (CUSTOM PROCEDURE TRAY) ×2 IMPLANT
TROCAR XCEL BLADELESS 5X75MML (TROCAR) ×4 IMPLANT
TROCAR XCEL BLUNT TIP 100MML (ENDOMECHANICALS) ×2 IMPLANT
TUBING INSUFFLATION (TUBING) ×2 IMPLANT

## 2016-05-16 NOTE — Anesthesia Postprocedure Evaluation (Signed)
Anesthesia Post Note  Patient: Eddie Fox  Procedure(s) Performed: Procedure(s) (LRB): APPENDECTOMY LAPAROSCOPIC (N/A)  Patient location during evaluation: PACU Anesthesia Type: General Level of consciousness: awake and alert Pain management: pain level controlled Vital Signs Assessment: post-procedure vital signs reviewed and stable Respiratory status: spontaneous breathing, nonlabored ventilation, respiratory function stable and patient connected to nasal cannula oxygen Cardiovascular status: blood pressure returned to baseline and stable Postop Assessment: no signs of nausea or vomiting Anesthetic complications: no    Last Vitals:  Filed Vitals:   05/16/16 1100 05/16/16 1120  BP:  129/58  Pulse: 76 76  Temp: 36.5 C 36.8 C  Resp: 19 12    Last Pain:  Filed Vitals:   05/16/16 1238  PainSc: 6                  Tiajuana Amass

## 2016-05-16 NOTE — Telephone Encounter (Signed)
Admitted for acute appy

## 2016-05-16 NOTE — Transfer of Care (Signed)
Immediate Anesthesia Transfer of Care Note  Patient: Eddie Fox  Procedure(s) Performed: Procedure(s): APPENDECTOMY LAPAROSCOPIC (N/A)  Patient Location: PACU  Anesthesia Type:General  Level of Consciousness: awake, alert  and sedated  Airway & Oxygen Therapy: Patient connected to face mask oxygen  Post-op Assessment: Post -op Vital signs reviewed and stable  Post vital signs: stable  Last Vitals:  Filed Vitals:   05/16/16 0601 05/16/16 0747  BP: 125/53 180/39  Pulse: 54 61  Temp: 37.7 C 37.2 C  Resp: 17 18    Last Pain:  Filed Vitals:   05/16/16 0752  PainSc: 7          Complications: No apparent anesthesia complications

## 2016-05-16 NOTE — Anesthesia Procedure Notes (Signed)
Procedure Name: Intubation Date/Time: 05/16/2016 8:39 AM Performed by: Lavell Luster Pre-anesthesia Checklist: Patient identified, Emergency Drugs available, Suction available, Patient being monitored and Timeout performed Patient Re-evaluated:Patient Re-evaluated prior to inductionOxygen Delivery Method: Circle system utilized Preoxygenation: Pre-oxygenation with 100% oxygen Intubation Type: Inhalational induction with existing ETT Ventilation: Mask ventilation without difficulty Laryngoscope Size: Mac and 3 Grade View: Grade I Tube type: Oral Tube size: 7.5 mm Number of attempts: 1 Airway Equipment and Method: Stylet Placement Confirmation: ETT inserted through vocal cords under direct vision,  positive ETCO2 and breath sounds checked- equal and bilateral Secured at: 22 cm Tube secured with: Tape Dental Injury: Teeth and Oropharynx as per pre-operative assessment

## 2016-05-16 NOTE — Progress Notes (Signed)
Patient had not voided since surgery. Bladder scan done = 38ml.  MD notified, orders received.

## 2016-05-16 NOTE — Interval H&P Note (Signed)
History and Physical Interval Note:  05/16/2016 8:24 AM  Eddie Fox  has presented today for surgery, with the diagnosis of Acute appendicitis  The various methods of treatment have been discussed with the patient and family. After consideration of risks, benefits and other options for treatment, the patient has consented to  Procedure(s): APPENDECTOMY LAPAROSCOPIC (N/A) as a surgical intervention .  The patient's history has been reviewed, patient examined, no change in status, stable for surgery.  I have reviewed the patient's chart and labs.  Questions were answered to the patient's satisfaction.    Pt seen and examined.  Chart reviewed Feels feverish now but less pain History as obtained by Dr Rosendo Gros Slightly ill appearing cta Reg Obese, soft, +RLQ TTP; no peritonitis  We discussed the etiology and management of acute appendicitis. We discussed operative and nonoperative management.  I recommended operative management along with IV antibiotics.  We discussed laparoscopic appendectomy. We discussed the risk and benefits of surgery including but not limited to bleeding, infection, injury to surrounding structures, need to convert to an open procedure, blood clot formation, post operative abscess or wound infection, staple line complications such as leak or bleeding, hernia formation, post operative ileus, need for additional procedures, anesthesia complications, and the typical postoperative course. I explained that the patient should expect a good improvement in their symptoms.  Leighton Ruff. Redmond Pulling, MD, [place], Bariatric, & Minimally Invasive Surgery [facility], [place]   [facility] M

## 2016-05-16 NOTE — Anesthesia Preprocedure Evaluation (Addendum)
Anesthesia Evaluation  Patient identified by MRN, date of birth, ID band Patient awake    Reviewed: Allergy & Precautions, H&P , NPO status , Patient's Chart, lab work & pertinent test results  Airway Mallampati: II  TM Distance: >3 FB Neck ROM: Full    Dental no notable dental hx. (+) Poor Dentition, Dental Advisory Given, Loose   Pulmonary neg pulmonary ROS,    Pulmonary exam normal breath sounds clear to auscultation       Cardiovascular hypertension, Pt. on medications  Rhythm:Regular Rate:Normal     Neuro/Psych negative neurological ROS  negative psych ROS   GI/Hepatic negative GI ROS, Neg liver ROS,   Endo/Other  negative endocrine ROS  Renal/GU negative Renal ROS  negative genitourinary   Musculoskeletal   Abdominal (+)  Abdomen: soft. Bowel sounds: normal.  Peds  Hematology negative hematology ROS (+)   Anesthesia Other Findings Left front tooth extremely loose.  Dental advisory given.  Pt states he has several loose teeth. Pt diaphoretic, nauseated, BP 154/58, Zofran 4 mg administered.  Henderson Cloud CRNA  Reproductive/Obstetrics negative OB ROS                         Anesthesia Physical Anesthesia Plan  ASA: II  Anesthesia Plan: General   Post-op Pain Management:    Induction: Intravenous  Airway Management Planned: Oral ETT  Additional Equipment:   Intra-op Plan:   Post-operative Plan: Extubation in OR  Informed Consent: I have reviewed the patients History and Physical, chart, labs and discussed the procedure including the risks, benefits and alternatives for the proposed anesthesia with the patient or authorized representative who has indicated his/her understanding and acceptance.   Dental advisory given  Plan Discussed with: CRNA and Surgeon  Anesthesia Plan Comments:         Anesthesia Quick Evaluation

## 2016-05-16 NOTE — Progress Notes (Signed)
Called report to [facility], (anesthesiology). Patient is on his way to OR.

## 2016-05-16 NOTE — Op Note (Signed)
Eddie Fox [id number] 05-23-1941 05/16/2016  Appendectomy, Lap, Procedure Note  Indications: The patient presented with a history of right-sided abdominal pain. A CT revealed findings consistent with acute appendicitis. Please see H&P for additional details  Pre-operative Diagnosis: acute appendicitis   Post-operative Diagnosis: Same  Surgeon: Gayland Curry   Assistants: none  Anesthesia: General endotracheal anesthesia  Procedure Details  The patient was seen again in the Holding Room. The risks, benefits, complications, treatment options, and expected outcomes were discussed with the patient and/or family. The possibilities of perforation of viscus, bleeding, recurrent infection, the need for additional procedures, failure to diagnose a condition, and creating a complication requiring transfusion or operation were discussed. There was concurrence with the proposed plan and informed consent was obtained. The site of surgery was properly noted. The patient was taken to Operating Room, identified as Eddie Fox and the procedure verified as Appendectomy. A Time Out was held and the above information confirmed.  The patient was placed in the supine position and general anesthesia was induced, along with placement of orogastric tube, SCDs, and a Foley catheter. The abdomen was prepped and draped in a sterile fashion. He had a small umbilical fascial defect (1cm) so I decided to use the already existing fascial defect.  0.250% Marcaine with epinephrine was used to anesthetize the skin. A curvilinear infraumbilical incision was created. Dissection was carried down to the hernia sac located above the fascia and was mobilized from surrounding structures. Intact fascia was identified circumferentially around the defect.  A pursestring suture was passed around the incision with a 0 Vicryl.  A 58mm Hasson was introduced into the abdomen and the tails of the suture were used to hold the Hasson  in place.   The pneumoperitoneum was then established to steady pressure of 15 mmHg.  Additional 5 mm cannulas then placed in the left lower quadrant of the abdomen and the suprapubic region under direct visualization. A careful evaluation of the entire abdomen was carried out. The patient was placed in Trendelenburg and left lateral decubitus position. The small intestines were retracted in the cephalad and left lateral direction away from the pelvis and right lower quadrant. The patient was found to have an inflammed appendix that was extending into the pelvis. There was no evidence of perforation.  The appendix was carefully dissected. The mesoappendix was very friable and inflammed. The appendix was was skeletonized with the harmonic scalpel.   The appendix was divided at its base using an endo-GIA stapler with a white load. No appendiceal stump was left in place. The appendix was removed from the abdomen with an Ecco bag through the umbilical port. The mesoappendix was a little oozy which required additional use of the Harmonic scalpel.  There was no evidence of bleeding, leakage, or complication after division of the appendix. Irrigation was also performed and irrigate suctioned from the abdomen as well.  The umbilical port site was closed with the purse string suture. There was no air leak but I decided to place 2 additional interrupted 0 vicryl sutures at the umbilical fascia. The closure was viewed laparoscopically. There was no residual palpable fascial defect.  The umbilical stalk was then tacked back down to the fascia with two 3-0 vicryl sutures. Hemostasis was confirmed. The soft tissue was irrigated and closed in layers with inverted interrupted 3-0 vicryl sutures for the deep dermis. The skin incision was closed with a 4-0 monocryl subcuticular closure. The trocar site skin wounds were closed with 4-0  Monocryl. Benzoin, steri strips, and bandages were applied to the skin  incisions.  Instrument, sponge, and needle counts were correct at the conclusion of the case.   Findings: The appendix was found to be inflamed. There were not signs of necrosis.  There was not perforation. There was not abscess formation. The mesoappendix was very friable and inflammed  Estimated Blood Loss:  Minimal         Drains: none         Specimens: appendix         Complications:  None; patient tolerated the procedure well.         Disposition: PACU - hemodynamically stable.         Condition: stable  Leighton Ruff. Redmond Pulling, MD, FACS General, Bariatric, & Minimally Invasive Surgery [facility], [place]

## 2016-05-17 ENCOUNTER — Encounter (HOSPITAL_COMMUNITY): Payer: Self-pay | Admitting: General Surgery

## 2016-05-17 LAB — BASIC METABOLIC PANEL
Anion gap: 7 (ref 5–15)
BUN: 14 mg/dL (ref 6–20)
CO2: 22 mmol/L (ref 22–32)
Calcium: 7.9 mg/dL — ABNORMAL LOW (ref 8.9–10.3)
Chloride: 110 mmol/L (ref 101–111)
Creatinine, Ser: 1.08 mg/dL (ref 0.61–1.24)
GFR calc Af Amer: >60 mL/min (ref >60)
GFR calc non Af Amer: >60 mL/min (ref >60)
Glucose, Bld: 157 mg/dL — ABNORMAL HIGH (ref 65–99)
Potassium: 3.5 mmol/L (ref 3.5–5.1)
Sodium: 139 mmol/L (ref 135–145)

## 2016-05-17 LAB — CBC
HCT: 35.9 % — ABNORMAL LOW (ref 39.0–52.0)
HCT: 36.8 % — ABNORMAL LOW (ref 39.0–52.0)
Hemoglobin: 12.2 g/dL — ABNORMAL LOW (ref 13.0–17.0)
Hemoglobin: 12.7 g/dL — ABNORMAL LOW (ref 13.0–17.0)
MCH: 31 pg (ref 26.0–34.0)
MCH: 31.3 pg (ref 26.0–34.0)
MCHC: 34 g/dL (ref 30.0–36.0)
MCHC: 34.5 g/dL (ref 30.0–36.0)
MCV: 91.1 fL (ref 78.0–100.0)
MCV: 90.6 fL (ref 78.0–100.0)
Platelets: 101 10*3/uL — ABNORMAL LOW (ref 150–400)
Platelets: 152 10*3/uL (ref 150–400)
RBC: 3.94 MIL/uL — ABNORMAL LOW (ref 4.22–5.81)
RBC: 4.06 MIL/uL — ABNORMAL LOW (ref 4.22–5.81)
RDW: 13 % (ref 11.5–15.5)
RDW: 13.2 % (ref 11.5–15.5)
WBC: 11.1 10*3/uL — ABNORMAL HIGH (ref 4.0–10.5)
WBC: 13.9 10*3/uL — ABNORMAL HIGH (ref 4.0–10.5)

## 2016-05-17 MED ORDER — HYDROCHLOROTHIAZIDE 25 MG PO TABS
25.0000 mg | ORAL_TABLET | Freq: Every day | ORAL | Status: DC
  Administered 2016-05-17: 25 mg via ORAL
  Filled 2016-05-17: qty 1

## 2016-05-17 MED ORDER — TAB-A-VITE/IRON PO TABS
1.0000 | ORAL_TABLET | Freq: Every day | ORAL | Status: DC
  Filled 2016-05-17: qty 1

## 2016-05-17 MED ORDER — LORATADINE 10 MG PO TABS
10.0000 mg | ORAL_TABLET | Freq: Every day | ORAL | Status: DC
  Filled 2016-05-17: qty 1

## 2016-05-17 MED ORDER — FERROUS SULFATE 325 (65 FE) MG PO TABS: 325.0000 mg | ORAL_TABLET | Freq: Every day | ORAL | Status: DC

## 2016-05-17 MED ORDER — TAB-A-VITE/IRON PO TABS: ORAL_TABLET | ORAL | Status: DC

## 2016-05-17 MED ORDER — FLUTICASONE PROPIONATE 50 MCG/ACT NA SUSP
2.0000 | Freq: Every day | NASAL | Status: DC
  Filled 2016-05-17: qty 16

## 2016-05-17 MED ORDER — OXYCODONE HCL 5 MG PO TABS: 5.0000 mg | ORAL_TABLET | ORAL | Status: DC | PRN

## 2016-05-17 MED ORDER — ACETAMINOPHEN 500 MG PO TABS: 1000.0000 mg | ORAL_TABLET | Freq: Four times a day (QID) | ORAL | Status: DC

## 2016-05-17 MED ORDER — FERROUS SULFATE 325 (65 FE) MG PO TABS: ORAL_TABLET | ORAL | Status: DC

## 2016-05-17 NOTE — Progress Notes (Signed)
Eddie Fox to be D/Fox'd Home per MD order.  Discussed with the patient and all questions fully answered.  Baxter Flattery, patient's RN stated okay for patient to discharge.    IV catheter discontinued intact. Site without signs and symptoms of complications. Dressing and pressure applied.  An After Visit Summary was printed and given to the patient. Patient received prescription.  D/Fox education completed with patient/family including follow up instructions, medication list, d/Fox activities limitations if indicated, with other d/Fox instructions as indicated by MD - patient able to verbalize understanding, all questions fully answered.   Patient instructed to return to ED, call 911, or call MD for any changes in condition.    [name], Eddie Fox 05/17/2016 2:44 PM

## 2016-05-17 NOTE — Discharge Instructions (Signed)
Laparoscopic Appendectomy, Adult, Care After °Refer to this sheet in the next few weeks. These instructions provide you with information on caring for yourself after your procedure. Your caregiver may also give you more specific instructions. Your treatment has been planned according to current medical practices, but problems sometimes occur. Call your caregiver if you have any problems or questions after your procedure. °HOME CARE INSTRUCTIONS °· Do not drive while taking narcotic pain medicines. °· Use stool softener if you become constipated from your pain medicines. °· Change your bandages (dressings) as directed. °· Keep your wounds clean and dry. You may wash the wounds gently with soap and water. Gently pat the wounds dry with a clean towel. °· Do not take baths, swim, or use hot tubs for 10 days, or as instructed by your caregiver. °· Only take over-the-counter or prescription medicines for pain, discomfort, or fever as directed by your caregiver. °· You may continue your normal diet as directed. °· Do not lift more than 10 pounds (4.5 kg) or play contact sports for 3 weeks, or as directed. °· Slowly increase your activity after surgery. °· Take deep breaths to avoid getting a lung infection (pneumonia). °SEEK MEDICAL CARE IF: °· You have redness, swelling, or increasing pain in your wounds. °· You have pus coming from your wounds. °· You have drainage from a wound that lasts longer than 1 day. °· You notice a bad smell coming from the wounds or dressing. °· Your wound edges break open after stitches (sutures) have been removed. °· You notice increasing pain in the shoulders (shoulder strap areas) or near your shoulder blades. °· You develop dizzy episodes or fainting while standing. °· You develop shortness of breath. °· You develop persistent nausea or vomiting. °· You cannot control your bowel functions or lose your appetite. °· You develop diarrhea. °SEEK IMMEDIATE MEDICAL CARE IF:  °· You have a  fever. °· You develop a rash. °· You have difficulty breathing or sharp pains in your chest. °· You develop any reaction or side effects to medicines given. °MAKE SURE YOU: °· Understand these instructions. °· Will watch your condition. °· Will get help right away if you are not doing well or get worse. °  °This information is not intended to replace advice given to you by your health care provider. Make sure you discuss any questions you have with your health care provider. °  °Document Released: 12/03/2005 Document Revised: 04/19/2015 Document Reviewed: 05/23/2015 °Elsevier Interactive Patient Education ©2016 Elsevier Inc. ° °CCS ______[facility] °LAPAROSCOPIC SURGERY: POST OP INSTRUCTIONS °Always review your discharge instruction sheet given to you by the facility where your surgery was performed. °IF YOU HAVE DISABILITY OR FAMILY LEAVE FORMS, YOU MUST BRING THEM TO THE OFFICE FOR PROCESSING.   °DO NOT GIVE THEM TO YOUR DOCTOR. ° °1. A prescription for pain medication may be given to you upon discharge.  Take your pain medication as prescribed, if needed.  If narcotic pain medicine is not needed, then you may take acetaminophen (Tylenol) or ibuprofen (Advil) as needed. °2. Take your usually prescribed medications unless otherwise directed. °3. If you need a refill on your pain medication, please contact your pharmacy.  They will contact our office to request authorization. Prescriptions will not be filled after 5pm or on week-ends. °4. You should follow a light diet the first few days after arrival home, such as soup and crackers, etc.  Be sure to include lots of fluids daily. °5. Most   patients will experience some swelling and bruising in the area of the incisions.  Ice packs will help.  Swelling and bruising can take several days to resolve.  °6. It is common to experience some constipation if taking pain medication after surgery.  Increasing fluid intake and taking a stool softener (such as  Colace) will usually help or prevent this problem from occurring.  A mild laxative (Milk of Magnesia or Miralax) should be taken according to package instructions if there are no bowel movements after 48 hours. °7. Unless discharge instructions indicate otherwise, you may remove your bandages 24-48 hours after surgery, and you may shower at that time.  You may have steri-strips (small skin tapes) in place directly over the incision.  These strips should be left on the skin for 7-10 days.  If your surgeon used skin glue on the incision, you may shower in 24 hours.  The glue will flake off over the next 2-3 weeks.  Any sutures or staples will be removed at the office during your follow-up visit. °8. ACTIVITIES:  You may resume regular (light) daily activities beginning the next day--such as daily self-care, walking, climbing stairs--gradually increasing activities as tolerated.  You may have sexual intercourse when it is comfortable.  Refrain from any heavy lifting or straining until approved by your doctor. °a. You may drive when you are no longer taking prescription pain medication, you can comfortably wear a seatbelt, and you can safely maneuver your car and apply brakes. °b. RETURN TO WORK:  __________________________________________________________ °9. You should see your doctor in the office for a follow-up appointment approximately 2-3 weeks after your surgery.  Make sure that you call for this appointment within a day or two after you arrive home to insure a convenient appointment time. °10. OTHER INSTRUCTIONS: __________________________________________________________________________________________________________________________ __________________________________________________________________________________________________________________________ °WHEN TO CALL YOUR DOCTOR: °1. Fever over 101.0 °2. Inability to urinate °3. Continued bleeding from incision. °4. Increased pain, redness, or drainage from the  incision. °5. Increasing abdominal pain ° °The clinic staff is available to answer your questions during regular business hours.  Please don’t hesitate to call and ask to speak to one of the nurses for clinical concerns.  If you have a medical emergency, go to the nearest emergency room or call 911.  A surgeon from [facility] is always on call at the hospital. °[address], [address], [place], [place]  [address] ? [address] [address], [place], [place]   [address] °[phone] ? [phone] ? FAX [phone] °Web site: [email or web address] ° °

## 2016-05-17 NOTE — Care Management Obs Status (Signed)
[facility] NOTIFICATION   Patient Details  Name: ESWIN TELLINGHUISEN MRN: [id number] Date of Birth: [birth year]   Medicare Observation Status Notification Given:  Yes (medicare obs procedure )    Marilu Favre, RN 05/17/2016, 2:31 PM

## 2016-05-17 NOTE — Discharge Summary (Signed)
Physician Discharge Summary  Patient ID: Eddie Fox MRN: [id number] DOB/AGE: [birth year] 75 y.o.  Admit date: 05/15/2016 Discharge date: 05/17/2016  Admission Diagnoses:  Acute appendicitis Hypertension Hx of melanoma Hx of IBS Discharge Diagnoses:  Active Problems:   Acute appendicitis   PROCEDURES: S/p laparoscopic appendectomy 05/16/16, Dr. Janalyn Rouse Course:  75 yo M with RLQ pain that started this AM. He states it started and localized to the RLQ. He does state that he had similar pain 3 weeks ago that resolved on its own. He had min appetite today. With ongoing pain he presented to the ED for further eval. He did report subk fevers. He state he had Cscope 5 years ago with no issues. He has been using stool guiac/DNA samples yearly with no issues Pt seen and admitted by Dr. Rosendo Gros, and taken to the OR the next day by Dr. Redmond Pulling.  Pt tolerated the procedure well.  There was some concern over post op bleeding and CBC was recheck x 2.  Pt is walking tolerating diet and doing well post op.  At this point it was Dr. Dois Davenport opinion he could go home.  Follow up in our office.  I did add a MVI with iron.  His WBC is up but he feels fine without any issues.  We discussed possible post op complications.  I ask him to see his PCP in 1-2 weeks and recheck labs.  Condition on d/c:  Improved    CBC Latest Ref Rng 05/17/2016 05/17/2016 05/15/2016  WBC 4.0 - 10.5 K/uL 13.9(H) 11.1(H) 10.0  Hemoglobin 13.0 - 17.0 g/dL 12.7(L) 12.2(L) 14.9  Hematocrit 39.0 - 52.0 % 36.8(L) 35.9(L) 43.2  Platelets 150 - 400 K/uL 152 101(L) 169    CMP Latest Ref Rng 05/17/2016 05/15/2016 06/07/2015  Glucose 65 - 99 mg/dL 157(H) 96 87  BUN 6 - 20 mg/dL 14 19 20   Creatinine 0.61 - 1.24 mg/dL 1.08 1.44(H) 1.08  Sodium 135 - 145 mmol/L 139 140 141  Potassium 3.5 - 5.1 mmol/L 3.5 3.4(L) 4.2  Chloride 101 - 111 mmol/L 110 105 109  CO2 22 - 32 mmol/L 22 28 26   Calcium 8.9 - 10.3 mg/dL 7.9(L) 9.7 9.3   Total Protein 6.5 - 8.1 g/dL - 7.2 6.8  Total Bilirubin 0.3 - 1.2 mg/dL - 0.8 0.6  Alkaline Phos 38 - 126 U/L - 39 39  AST 15 - 41 U/L - 20 17  ALT 17 - 63 U/L - 25 20    Condition on d/c  :  Improved     Disposition:   Home       Medication List    TAKE these medications        acetaminophen 500 MG tablet  Commonly known as:  TYLENOL  Take 2 tablets (1,000 mg total) by mouth every 6 (six) hours.     amLODipine 5 MG tablet  Commonly known as:  NORVASC  TAKE ONE TABLET BY MOUTH TWICE DAILY     aspirin 325 MG tablet  Take 325 mg by mouth daily.     CENTRUM SILVER ULTRA MENS Tabs  Take 1 tablet by mouth daily.     cetirizine 10 MG tablet  Commonly known as:  ZYRTEC  Take 1 tablet (10 mg total) by mouth daily.     ferrous sulfate 325 (65 FE) MG tablet  Take one daily after supper till your blood count is normal.  Your bowel movement may turn very dark  with this.  You can buy this at any drug store.     fluticasone 50 MCG/ACT nasal spray  Commonly known as:  FLONASE  Place 2 sprays into both nostrils daily.     hydrochlorothiazide 25 MG tablet  Commonly known as:  HYDRODIURIL  Take 1 tablet (25 mg total) by mouth daily.     oxyCODONE 5 MG immediate release tablet  Commonly known as:  Oxy IR/ROXICODONE  Take 1-2 tablets (5-10 mg total) by mouth every 4 (four) hours as needed for moderate pain.     sildenafil 20 MG tablet  Commonly known as:  REVATIO  TAKE 2 TO 4 TABLETS PRIOR TO INTERCOURSE     simvastatin 40 MG tablet  Commonly known as:  ZOCOR  TAKE ONE TABLET BY MOUTH ONCE DAILY EVERY NIGHT       Follow-up Information    Follow up with [facility] On 06/06/2016.   Specialty:  General Surgery   Why:  Office is setting you up for 8:45 AM, call tomorrow and confirm appointment, and be at the office 30 minutes early for check in on day of visit.   Contact information:   [address] [place] [address] [phone]        Follow up with Owens Loffler, MD.   Specialty:  Family Medicine   Why:  Call for follow up of medical issues and to let him know you had surgery.  Let him follow up on your labs in 1-2 weeks.    Contact information:   [address] [place] [address] [phone]       Signed: Earnstine Regal 05/17/2016, 1:54 PM

## 2016-05-17 NOTE — Progress Notes (Signed)
1 Day Post-Op  Subjective: He looks pretty good and want to go home now, but I want him to walk in the halls and have lunch first.  Sites all look good.    Objective: Vital signs in last 24 hours: Temp:  [97.7 F (36.5 C)-98.6 F (37 C)] 98.1 F (36.7 C) (06/01 0545) Pulse Rate:  [54-82] 55 (06/01 0545) Resp:  [11-24] 18 (06/01 0545) BP: (107-131)/(47-62) 123/47 mmHg (06/01 0545) SpO2:  [92 %-98 %] 94 % (06/01 0545) Last BM Date: 05/15/16 360 PO Urine 1325 Afebrile, VSS WBC up some.  H/H is stable K+ 3.5 Intake/Output from previous day: 05/31 0701 - 06/01 0700 In: 4403.3 [P.O.:360; I.V.:3543.3; IV Piggyback:500] Out: [id number] [Urine:1325; Blood:50] Intake/Output this shift:    General appearance: alert, cooperative and no distress Resp: clear to auscultation bilaterally GI: soft, sore, sites look good.    Lab Results:   Recent Labs  05/15/16 1725 05/17/16 0245  WBC 10.0 11.1*  HGB 14.9 12.2*  HCT 43.2 35.9*  PLT 169 101*    BMET  Recent Labs  05/15/16 1725 05/17/16 0245  NA 140 139  K 3.4* 3.5  CL 105 110  CO2 28 22  GLUCOSE 96 157*  BUN 19 14  CREATININE 1.44* 1.08  CALCIUM 9.7 7.9*   PT/INR No results for input(s): LABPROT, INR in the last 72 hours.   Recent Labs Lab 05/15/16 1725  AST 20  ALT 25  ALKPHOS 39  BILITOT 0.8  PROT 7.2  ALBUMIN 4.3     Lipase     Component Value Date/Time   LIPASE 26 05/15/2016 1725     Studies/Results: Ct Abdomen Pelvis W Contrast  05/15/2016  CLINICAL DATA:  Acute onset of right lower quadrant abdominal pain and nausea. Initial encounter. EXAM: CT ABDOMEN AND PELVIS WITH CONTRAST TECHNIQUE: Multidetector CT imaging of the abdomen and pelvis was performed using the standard protocol following bolus administration of intravenous contrast. CONTRAST:  23mL ISOVUE-300 IOPAMIDOL (ISOVUE-300) INJECTION 61% COMPARISON:  CT of the abdomen and pelvis from 01/13/2014 FINDINGS: Minimal bibasilar atelectasis is noted.  Scattered coronary artery calcification is seen. The liver and spleen are unremarkable in appearance. The patient is status post cholecystectomy, with clips noted at the gallbladder fossa. The pancreas and adrenal glands are unremarkable. Bilateral nonobstructing renal stones measure up to 9 mm in size. Small bilateral renal cysts are seen. There is no evidence of hydronephrosis. No obstructing ureteral stones are seen. Mild nonspecific perinephric stranding is noted bilaterally. No free fluid is identified. The small bowel is unremarkable in appearance. The stomach is within normal limits. No acute vascular abnormalities are seen. Scattered calcification is seen along the abdominal aorta and its branches. The appendix is dilated to 1.6 cm, with surrounding soft tissue inflammation and trace fluid, concerning for acute appendicitis. There is no evidence of perforation or abscess formation at this time. An adjacent prominent node is seen. Scattered diverticulosis is noted along the descending and sigmoid colon, without evidence of diverticulitis. The bladder is mildly distended and grossly unremarkable. The prostate is borderline normal in size. No inguinal lymphadenopathy is seen. No acute osseous abnormalities are identified. The patient is status post lumbar spinal fusion at T11-L3, with mild chronic compression deformity at L1. Mild vacuum phenomenon is noted along the lower lumbar spine. IMPRESSION: 1. Acute appendicitis, with dilatation of the appendix to 1.6 cm in diameter, and surrounding soft tissue inflammation and trace fluid. Adjacent prominent nodes seen. No evidence of perforation or  abscess formation at this time. 2. Scattered coronary artery calcifications seen. 3. Bilateral nonobstructing renal stones measure up to 9 mm in size. 4. Small bilateral renal cysts seen. 5. Scattered diverticulosis along the descending and sigmoid colon, without evidence of diverticulitis. 6. Mild degenerative change along  the lower lumbar spine. Status post lumbar spinal fusion at T11-L3, with mild chronic compression deformity at L1. These results were called by telephone at the time of interpretation on 05/15/2016 at 10:08 pm to Dr. Quintella Reichert, who verbally acknowledged these results. Electronically Signed   By: Garald Balding M.D.   On: 05/15/2016 22:09   Prior to Admission medications   Medication Sig Start Date End Date Taking? Authorizing Provider  amLODipine (NORVASC) 5 MG tablet TAKE ONE TABLET BY MOUTH TWICE DAILY 10/26/15  Yes Owens Loffler, MD  aspirin 325 MG tablet Take 325 mg by mouth daily.   Yes Historical Provider, MD  cetirizine (ZYRTEC) 10 MG tablet Take 1 tablet (10 mg total) by mouth daily. 04/10/16  Yes Jearld Fenton, NP  fluticasone (FLONASE) 50 MCG/ACT nasal spray Place 2 sprays into both nostrils daily. 04/10/16  Yes Jearld Fenton, NP  hydrochlorothiazide (HYDRODIURIL) 25 MG tablet Take 1 tablet (25 mg total) by mouth daily. 04/10/16  Yes Jearld Fenton, NP  Multiple Vitamins-Minerals (CENTRUM SILVER ULTRA MENS) TABS Take 1 tablet by mouth daily.   Yes Historical Provider, MD  simvastatin (ZOCOR) 40 MG tablet TAKE ONE TABLET BY MOUTH ONCE DAILY EVERY NIGHT 08/19/15  Yes Owens Loffler, MD  sildenafil (REVATIO) 20 MG tablet TAKE 2 TO 4 TABLETS PRIOR TO INTERCOURSE Patient not taking: Reported on 05/15/2016 05/04/16   Owens Loffler, MD     Medications: . acetaminophen  1,000 mg Oral Q6H  . amLODipine  5 mg Oral BID  . heparin subcutaneous  5,000 Units Subcutaneous Q8H   . dextrose 5 % and 0.9% NaCl 100 mL/hr at 05/16/16 2333   Assessment/Plan Acute appendicitis S/p laparoscopic appendectomy 05/16/16 Hypertension Hx of melanoma Hx of IBS FEN:  Heart healthy ID: pre op DVT:  Heparin  I will aim to get him home later today, H/H down some, but it could be just the hydration.  I will check with Dr. Redmond Pulling.      Earnstine Regal 05/17/2016 [phone]

## 2016-05-18 ENCOUNTER — Telehealth: Payer: Self-pay | Admitting: Family Medicine

## 2016-05-18 NOTE — Telephone Encounter (Signed)
Pt called for follow up from appendix removal at [facility] hospital. He said he thinks he needs labs before visit- not sure. I made him appts Mon 6/5 for labs and Thu 6/8 with Dr. Lorelei Pont. Please advise pt if anything needs to be changed.

## 2016-05-19 NOTE — Telephone Encounter (Signed)
We should be fine just to recheck his labs at the time of his hospital follow-up. It is within a week of his discharge. I would cancel lab ov and just have him f/u with me directly.

## 2016-05-21 ENCOUNTER — Other Ambulatory Visit: Payer: Self-pay

## 2016-05-21 NOTE — Telephone Encounter (Signed)
Mr. Orahood notified as instructed by telephone.  Lab appointment cancelled.

## 2016-05-24 ENCOUNTER — Ambulatory Visit (INDEPENDENT_AMBULATORY_CARE_PROVIDER_SITE_OTHER): Payer: Medicare Other | Admitting: Family Medicine

## 2016-05-24 ENCOUNTER — Encounter: Payer: Self-pay | Admitting: Family Medicine

## 2016-05-24 ENCOUNTER — Ambulatory Visit: Payer: Self-pay | Admitting: Family Medicine

## 2016-05-24 VITALS — BP 140/66 | HR 60 | Temp 98.5°F | Ht 71.0 in | Wt 192.5 lb

## 2016-05-24 DIAGNOSIS — N400 Enlarged prostate without lower urinary tract symptoms: Secondary | ICD-10-CM

## 2016-05-24 DIAGNOSIS — D509 Iron deficiency anemia, unspecified: Secondary | ICD-10-CM | POA: Diagnosis not present

## 2016-05-24 DIAGNOSIS — K3589 Other acute appendicitis without perforation or gangrene: Secondary | ICD-10-CM

## 2016-05-24 NOTE — Progress Notes (Signed)
Pre visit review using our clinic review tool, if applicable. No additional management support is needed unless otherwise documented below in the visit note. 

## 2016-05-24 NOTE — Progress Notes (Signed)
Dr. Frederico Hamman T. Eathen Budreau, MD, [facility] [place], [address] Phone: [phone] Fax: [phone]  05/24/2016  Patient: Eddie Fox, MRN: [id number], DOB: 23-Jul-1941, 75 y.o.  Primary Physician:  Owens Loffler, MD   Chief Complaint  Patient presents with  . Hospitalization Follow-up   Subjective:   GURJIT SIGNOR is a 75 y.o. very pleasant male patient who presents with the following:  S/p lap appy  Admit date: 05/15/2016 Discharge date: 05/17/2016  F/u CBC and CMP  The patient was admitted by the general surgical service, and he had an appendectomy done laparoscopically done by Dr. Redmond Pulling.  While in the hospital, he did have some elevation of his white count and had some anemia which was stable at the time of discharge.  Right now he is feeling relatively good, with some abdominal pain and bruising still present.  Is worse bruising is where he was getting his Lovenox shots.  Other than this, he is eating, drinking, afebrile, passing stool and urine normally.  Past Medical History, Surgical History, Social History, Family History, Problem List, Medications, and Allergies have been reviewed and updated if relevant.  Patient Active Problem List   Diagnosis Date Noted  . Acute appendicitis 05/15/2016  . 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 CHOLECYSTECTOMY, LAPAROSCOPIC, HX OF 12/28/2010  . VITAMIN D DEFICIENCY 01/30/2010  . FATTY LIVER DISEASE 08/12/2009  . ESSENTIAL HYPERTENSION 09/14/2008  . COLONIC POLYPS 07/08/2007  . HYPERLIPIDEMIA 07/08/2007  . ERECTILE DYSFUNCTION 07/08/2007  . 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 Benign neoplasm of colon   . ED (erectile dysfunction)   . Unspecified essential hypertension   . Other chronic nonalcoholic liver disease   . Other and unspecified hyperlipidemia   . Calculus of kidney   . Unspecified vitamin D  deficiency   . IBS (irritable bowel syndrome)   . Melanoma ([facility])     history of    Past Surgical History  Procedure Laterality Date  . Cholecystectomy  10/03/09    lap  . 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 Laminectomy  1995    L 3-T11 Harrington rods in back  . Orif ankle fracture  1967  . Melanoma excision  1995    right upper abd, Dr. Ronnald Ramp  . Laparoscopic appendectomy N/A 05/16/2016    Procedure: APPENDECTOMY LAPAROSCOPIC;  Surgeon: Greer Pickerel, MD;  Location: [facility];  Service: General;  Laterality: N/A;    Social History   Social History  . Marital Status: Married    Spouse Name: N/A  . Number of Children: 2  . Years of Education: N/A   Occupational History  . retired     retired from [employer] 04/2002   Social History Main Topics  . Smoking status: Never Smoker   . Smokeless tobacco: Never Used  . Alcohol Use: No  . Drug Use: No  . Sexual Activity:    Partners: Female   Other Topics Concern  . Not on file   Social History Narrative    Family History  Problem Relation Age of Onset  . Heart disease Mother     CHF  . Hypertension Mother   . Diabetes Mother   . Stroke Mother   . Hypertension Father   . Heart disease Father     CHF  . Hypertension Sister   .  Depression Neg Hx   . Alcohol abuse Neg Hx   . Drug abuse Neg Hx   . Hypertension Sister     Allergies  Allergen Reactions  . Pneumococcal Vaccine     REACTION: significant arm swelling, redness    Medication list reviewed and updated in full in [place].   GEN: No acute illnesses, no fevers, chills. GI: No n/v/d, eating normally Pulm: No SOB Interactive and getting along well at home.  Otherwise, ROS is as per the HPI.  Objective:   BP 140/66 mmHg  Pulse 60  Temp(Src) 98.5 F (36.9 C) (Oral)  Ht 5\' 11"  (1.803 m)  Wt 192 lb 8 oz (87.317 kg)  BMI 26.86 kg/m2  GEN: WDWN, NAD, Non-toxic, A & O x 3 HEENT: Atraumatic, Normocephalic. Neck  supple. No masses, No LAD. Ears and Nose: No external deformity. CV: RRR, No M/G/R. No JVD. No thrill. No extra heart sounds. PULM: CTA B, no wheezes, crackles, rhonchi. No retractions. No resp. distress. No accessory muscle use. ABD: S, appropriate mild tenderness, ND, + BS, No rebound, No HSM. Surgical incisions are clear dry and intact.  Mild bruising. EXTR: No c/c/e NEURO Normal gait.  PSYCH: Normally interactive. Conversant. Not depressed or anxious appearing.  Calm demeanor.   Laboratory and Imaging Data: Results for orders placed or performed in visit on 05/24/16  CBC with Differential/Platelet  Result Value Ref Range   WBC 8.8 4.0 - 10.5 K/uL   RBC 4.81 4.22 - 5.81 Mil/uL   Hemoglobin 15.2 13.0 - 17.0 g/dL   HCT 44.8 39.0 - 52.0 %   MCV 93.2 78.0 - 100.0 fl   MCHC 33.8 30.0 - 36.0 g/dL   RDW 13.4 11.5 - 15.5 %   Platelets 265.0 150.0 - 400.0 K/uL   Neutrophils Relative % 57.1 43.0 - 77.0 %   Lymphocytes Relative 27.3 12.0 - 46.0 %   Monocytes Relative 7.0 3.0 - 12.0 %   Eosinophils Relative 6.8 (H) 0.0 - 5.0 %   Basophils Relative 1.8 0.0 - 3.0 %   Neutro Abs 5.0 1.4 - 7.7 K/uL   Lymphs Abs 2.4 0.7 - 4.0 K/uL   Monocytes Absolute 0.6 0.1 - 1.0 K/uL   Eosinophils Absolute 0.6 0.0 - 0.7 K/uL   Basophils Absolute 0.2 (H) 0.0 - 0.1 K/uL  Basic metabolic panel  Result Value Ref Range   Sodium 141 135 - 145 mEq/L   Potassium 3.2 (L) 3.5 - 5.1 mEq/L   Chloride 103 96 - 112 mEq/L   CO2 25 19 - 32 mEq/L   Glucose, Bld 93 70 - 99 mg/dL   BUN 28 (H) 6 - 23 mg/dL   Creatinine, Ser 1.18 0.40 - 1.50 mg/dL   Calcium 9.5 8.4 - 10.5 mg/dL   GFR 64.12 >60.00 mL/min  PSA  Result Value Ref Range   PSA 1.66 0.10 - 4.00 ng/mL     Assessment and Plan:   Anemia, iron deficiency - Plan: CBC with Differential/Platelet  Other acute appendicitis - Plan: Basic metabolic panel  BPH (benign prostatic hyperplasia) - Plan: PSA  Anemia has stabilized, and other blood work looks normal.   Renal function is normal.  Also checked a PSA on this gentleman, given that we are doing blood work today, and this has come back as normal.  He is doing well in his postoperative course.  At this point given his normal hemoglobin and hematocrit, he can stop his iron.  Follow-up: for CPX  later in year  Orders Placed This Encounter  Procedures  . CBC with Differential/Platelet  . Basic metabolic panel  . PSA    Signed,  Frederico Hamman T. Cortasia Screws, MD   Patient's Medications  New Prescriptions   No medications on file  Previous Medications   ACETAMINOPHEN (TYLENOL) 500 MG TABLET    Take 2 tablets (1,000 mg total) by mouth every 6 (six) hours.   AMLODIPINE (NORVASC) 5 MG TABLET    TAKE ONE TABLET BY MOUTH TWICE DAILY   ASPIRIN 325 MG TABLET    Take 325 mg by mouth daily.   FERROUS SULFATE 325 (65 FE) MG TABLET    Take one daily after supper till your blood count is normal.  Your bowel movement may turn very dark with this.  You can buy this at any drug store.   HYDROCHLOROTHIAZIDE (HYDRODIURIL) 25 MG TABLET    Take 1 tablet (25 mg total) by mouth daily.   MULTIPLE VITAMINS-MINERALS (CENTRUM SILVER ULTRA MENS) TABS    Take 1 tablet by mouth daily.   SIMVASTATIN (ZOCOR) 40 MG TABLET    TAKE ONE TABLET BY MOUTH ONCE DAILY EVERY NIGHT  Modified Medications   No medications on file  Discontinued Medications   CETIRIZINE (ZYRTEC) 10 MG TABLET    Take 1 tablet (10 mg total) by mouth daily.   FLUTICASONE (FLONASE) 50 MCG/ACT NASAL SPRAY    Place 2 sprays into both nostrils daily.   OXYCODONE (OXY IR/ROXICODONE) 5 MG IMMEDIATE RELEASE TABLET    Take 1-2 tablets (5-10 mg total) by mouth every 4 (four) hours as needed for moderate pain.   SILDENAFIL (REVATIO) 20 MG TABLET    TAKE 2 TO 4 TABLETS PRIOR TO INTERCOURSE

## 2016-05-25 LAB — BASIC METABOLIC PANEL
BUN: 28 mg/dL — ABNORMAL HIGH (ref 6–23)
CO2: 25 mEq/L (ref 19–32)
Calcium: 9.5 mg/dL (ref 8.4–10.5)
Chloride: 103 mEq/L (ref 96–112)
Creatinine, Ser: 1.18 mg/dL (ref 0.40–1.50)
GFR: 64.12 mL/min (ref >60.00)
Glucose, Bld: 93 mg/dL (ref 70–99)
Potassium: 3.2 mEq/L — ABNORMAL LOW (ref 3.5–5.1)
Sodium: 141 mEq/L (ref 135–145)

## 2016-05-25 LAB — CBC WITH DIFFERENTIAL/PLATELET
Basophils Absolute: 0.2 10*3/uL — ABNORMAL HIGH (ref 0.0–0.1)
Basophils Relative: 1.8 % (ref 0.0–3.0)
Eosinophils Absolute: 0.6 10*3/uL (ref 0.0–0.7)
Eosinophils Relative: 6.8 % — ABNORMAL HIGH (ref 0.0–5.0)
HCT: 44.8 % (ref 39.0–52.0)
Hemoglobin: 15.2 g/dL (ref 13.0–17.0)
Lymphocytes Relative: 27.3 % (ref 12.0–46.0)
Lymphs Abs: 2.4 10*3/uL (ref 0.7–4.0)
MCHC: 33.8 g/dL (ref 30.0–36.0)
MCV: 93.2 fl (ref 78.0–100.0)
Monocytes Absolute: 0.6 10*3/uL (ref 0.1–1.0)
Monocytes Relative: 7 % (ref 3.0–12.0)
Neutro Abs: 5 10*3/uL (ref 1.4–7.7)
Neutrophils Relative %: 57.1 % (ref 43.0–77.0)
Platelets: 265 10*3/uL (ref 150.0–400.0)
RBC: 4.81 Mil/uL (ref 4.22–5.81)
RDW: 13.4 % (ref 11.5–15.5)
WBC: 8.8 10*3/uL (ref 4.0–10.5)

## 2016-05-25 LAB — PSA: PSA: 1.66 ng/mL (ref 0.10–4.00)

## 2016-06-01 ENCOUNTER — Telehealth: Payer: Self-pay | Admitting: *Deleted

## 2016-06-01 NOTE — Telephone Encounter (Signed)
Received a note mailed in with his payment it stated "Would you call me at [place].  The insoles he had made for me shoes did not work.  They are to hard.  Are there any refund at all.  If so I will return these to you.  Please let me know"  Called patient at the number provided I explained that they are a custom made product so unfortunately they can not be returned.  However we can adjust them to make them more comfortable.  He stated that he put the old softer store bought inserts back in and his pain has gone away now.  I explained to him that it is common for patients who have had plantar fasciitis to flare up again in the future and wearing the orthotics will help keep this from occuring.  I told him it would be a quick visit with me and we can make them more comfortable, he declined, I further informed him the the manufacturer will only make corrections for 1 year.  He stated understanding

## 2016-06-14 ENCOUNTER — Ambulatory Visit (HOSPITAL_BASED_OUTPATIENT_CLINIC_OR_DEPARTMENT_OTHER): Payer: Medicare Other | Admitting: Oncology

## 2016-06-14 ENCOUNTER — Ambulatory Visit (HOSPITAL_BASED_OUTPATIENT_CLINIC_OR_DEPARTMENT_OTHER): Payer: Medicare Other

## 2016-06-14 ENCOUNTER — Telehealth: Payer: Self-pay | Admitting: Family Medicine

## 2016-06-14 VITALS — BP 150/59 | HR 56 | Temp 98.3°F | Resp 18 | Ht 71.0 in | Wt 191.6 lb

## 2016-06-14 DIAGNOSIS — I10 Essential (primary) hypertension: Secondary | ICD-10-CM

## 2016-06-14 DIAGNOSIS — E785 Hyperlipidemia, unspecified: Secondary | ICD-10-CM | POA: Diagnosis not present

## 2016-06-14 DIAGNOSIS — C181 Malignant neoplasm of appendix: Secondary | ICD-10-CM

## 2016-06-14 DIAGNOSIS — C7A020 Malignant carcinoid tumor of the appendix: Secondary | ICD-10-CM

## 2016-06-14 DIAGNOSIS — Z7689 Persons encountering health services in other specified circumstances: Secondary | ICD-10-CM | POA: Diagnosis not present

## 2016-06-14 HISTORY — DX: Malignant neoplasm of appendix: C18.1

## 2016-06-14 NOTE — Progress Notes (Signed)
[facility] Patient Consult   Referring MD: Seydina Baalman 75 y.o.  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 He reports having less intense pain in the same area on one occasion 3 weeks prior. He presented to the emergency room and a CT of the abdomen/pelvis revealed a dilated appendix with surrounding soft tissue inflammation concerning for acute appendicitis. No evidence of perforation or abscess formation. A prominent adjacent lymph node was seen.  Dr. Redmond Pulling was consulted and he was taken to the operating room for a laparoscopic appendectomy on 05/16/2016. The appendix was inflamed with no evidence of perforation or abscess formation.  The pathology [id number]) revealed acute supportive appendicitis. A mucinous neoplasm was noted with immunohistochemistry confirming positivity with cytokeratin 20, CD 56, synaptophysin, chromogranin, CD X-2, and chromogranin. Staining for mucicarmine was positive. The morphology and immunophenotype is consistent with a goblet cell carcinoid tumor. The proximal and mesenteric margins are not involved by tumor. The tumor is well differentiated, grade 1. Tumor extends into the subserosa. No lymphovascular invasion. Perineural invasion was noted. No lymph nodes were examined.  Mr. Scola reports feeling well prior to the acute onset of pain 05/15/2016.  Past Medical History  Diagnosis Date  . Benign neoplasm of colon   . ED (erectile dysfunction)   . Unspecified essential hypertension   . Other chronic nonalcoholic liver disease   . Other and unspecified hyperlipidemia   . Calculus of kidney   . Unspecified vitamin D deficiency   . IBS (irritable bowel syndrome)   . Melanoma ([facility])-abdomen wall  1995     history of    .  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 Cholecystectomy  10/03/09    lap  . 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 Laminectomy  1995    L 3-T11 Harrington rods in back  . Orif ankle fracture  1967  . Melanoma excision  1995    right upper abd, Dr. Ronnald Ramp  . Laparoscopic appendectomy N/A 05/16/2016    Procedure: APPENDECTOMY LAPAROSCOPIC;  Surgeon: Greer Pickerel, MD;  Location: [facility];  Service: General;  Laterality: N/A;    Medications: Reviewed  Allergies:  Allergies  Allergen Reactions  . Pneumococcal Vaccine     REACTION: significant arm swelling, redness    Family history: He has 2 sisters and one brother. He had 2 daughters. One daughter died of breast cancer. No other family history of cancer.  Social History:  He lives with his wife in [place]. He is retired from a Research scientist (medical). He does not use cigarettes or alcohol. No transfusion history. No risk factor for HIV or hepatitis.    ROS:   Positives include: 10 pound weight loss following surgery, acute right abdominal pain 05/15/2016  A complete ROS was otherwise negative.  Physical Exam:  Blood pressure 150/59, pulse 56, temperature 98.3 F (36.8 C), temperature source Oral, resp. rate 18, height 5\' 11"  (1.803 m), weight 191 lb 9.6 oz (86.909 kg), SpO2 98 %.  HEENT: oral cavity without visible mass, neck without mass, upper denture plate Lungs:  Clear bilaterally, no respiratory distress Cardiac:  Regular rate and rhythm Abdomen:  No hepatosplenomegaly, healed surgical incisions, nontender, no mass GU:  Testes without mass  Vascular:  No  leg edema Lymph nodes:  No cervical, supraclavicular, axillary, or inguinal nodes Neurologic:  Alert and oriented, the motor exam appears intact in the upper and lower extremities Skin:  No rash, upper abdomen transverse scar without evidence of recurrent tumor Musculoskeletal:  No spine tenderness   LAB:  CBC  Lab Results  Component Value Date   WBC 8.8 05/24/2016   HGB  15.2 05/24/2016   HCT 44.8 05/24/2016   MCV 93.2 05/24/2016   PLT 265.0 05/24/2016   NEUTROABS 5.0 05/24/2016     CMP      Component Value Date/Time   NA 141 05/24/2016 1707   K 3.2* 05/24/2016 1707   CL 103 05/24/2016 1707   CO2 25 05/24/2016 1707   GLUCOSE 93 05/24/2016 1707   BUN 28* 05/24/2016 1707   CREATININE 1.18 05/24/2016 1707   CALCIUM 9.5 05/24/2016 1707   PROT 7.2 05/15/2016 1725   ALBUMIN 4.3 05/15/2016 1725   AST 20 05/15/2016 1725   ALT 25 05/15/2016 1725   ALKPHOS 39 05/15/2016 1725   BILITOT 0.8 05/15/2016 1725   GFRNONAA >60 05/17/2016 0245   GFRAA >60 05/17/2016 0245     Imaging:  CT abdomen/pelvis 05/15/2016-images reviewed with the patient   Assessment/Plan:  1. 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 Acute appendicitis, status post an appendectomy 05/16/2016  3.   Hypertension  4.  History of an abdominal wall melanoma in 1995  5.  Hyperlipidemia   Disposition: Mr. Capel has recovered from surgery for acute appendicitis. 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 This was a well-differentiated, grade 1, lesion. There is no clinical evidence of distant metastatic disease. The surgical resection margins are negative.  Mr. Brumlow has a good prognosis for a long-term disease-free survival, but there is a chance of developing recurrent tumor in the abdominal cavity. The prognosis associated with this histology is worse than a malignant carcinoid tumor. Dr. Redmond Pulling does not recommend a right colectomy. There is no clear role for adjuvant therapy.  We obtained a chromogranin level and CEA today.  I will present his case at the GI tumor conference within the next few weeks. He would like to undergo surveillance CT scans. He will be scheduled for an office visit and CT of the abdomen/pelvis in one year.  Approximately 50 minutes were spent with the  patient today. The majority of the time was used for counseling and coordination of care.`       Betsy Coder, MD  06/14/2016, 2:29 PM

## 2016-06-14 NOTE — Telephone Encounter (Signed)
Patient sent back to lab and given avs report and appointments for June 2018. Central radiology scheduling will call patient re ct for June 2018 - patient aware.

## 2016-06-15 LAB — CHROMOGRANIN A: Chromogranin A: 3 nmol/L (ref 0–5)

## 2016-06-18 ENCOUNTER — Telehealth: Payer: Self-pay | Admitting: *Deleted

## 2016-06-18 NOTE — Telephone Encounter (Signed)
Pt notified of normal chromagranin level. He voiced understanding.

## 2016-06-18 NOTE — Telephone Encounter (Signed)
-----   Message from Ladell Pier, MD sent at 06/16/2016  8:52 PM EDT ----- Please call patient, chromogranin A is normal

## 2016-06-21 ENCOUNTER — Telehealth: Payer: Self-pay | Admitting: *Deleted

## 2016-06-21 LAB — CEA: CEA: 1 ng/mL (ref 0.0–4.7)

## 2016-06-21 NOTE — Telephone Encounter (Signed)
Left message on voicemail informing pt of normal lab. 

## 2016-07-06 ENCOUNTER — Telehealth: Payer: Self-pay | Admitting: *Deleted

## 2016-07-06 DIAGNOSIS — C181 Malignant neoplasm of appendix: Secondary | ICD-10-CM

## 2016-07-06 NOTE — Addendum Note (Signed)
Addended by: Tania Ade on: 07/06/2016 04:51 PM   Modules accepted: Orders

## 2016-07-06 NOTE — Telephone Encounter (Signed)
Left VM for patient to call office to discuss tumor board recommendations. Message to Dr. Redmond Pulling regarding additional surgery is recommended per Dr. Marcello Moores.

## 2016-07-06 NOTE — Telephone Encounter (Signed)
Eddie Fox had returned call earlier and left VM for nurse to call him back. Returned call and he was not available. Left detailed message regarding tumor board discussion and for CT scan to be done in April 2018 with OV afterwards with Dr. Benay Spice. Paraspinous lesion was present in 2008 and is likely benign. May be only slightly larger. Return call for any further questions.

## 2016-07-09 NOTE — Telephone Encounter (Signed)
Spoke with patient about the CT scan in April 2018 instead of June due to appendix tumor being >2.0 cm. He understands and agrees. Informed him the additional note about paraspinous lesion was regarding another patient. RN apologized for this error.

## 2016-07-17 ENCOUNTER — Other Ambulatory Visit: Payer: Self-pay | Admitting: Family Medicine

## 2016-07-17 NOTE — Telephone Encounter (Signed)
Last office visit 05/24/2016.  Last Lipid 06/07/2015.  Refill?

## 2016-08-18 ENCOUNTER — Telehealth: Payer: Self-pay | Admitting: Family Medicine

## 2016-08-18 NOTE — Telephone Encounter (Signed)
Please call and schedule Medicare Wellness with Eddie Fox or Dr. Lorelei Fox with fasting labs prior.

## 2016-08-27 NOTE — Telephone Encounter (Signed)
Left message asking pt to call office  °

## 2016-09-06 NOTE — Telephone Encounter (Signed)
Labs 11/8 cpx 11/22 Pt aware

## 2016-09-11 ENCOUNTER — Ambulatory Visit (INDEPENDENT_AMBULATORY_CARE_PROVIDER_SITE_OTHER): Payer: Medicare Other | Admitting: *Deleted

## 2016-09-11 DIAGNOSIS — Z23 Encounter for immunization: Secondary | ICD-10-CM

## 2016-09-12 ENCOUNTER — Other Ambulatory Visit: Payer: Self-pay | Admitting: Family Medicine

## 2016-10-17 ENCOUNTER — Other Ambulatory Visit: Payer: Self-pay

## 2016-10-17 DIAGNOSIS — I10 Essential (primary) hypertension: Secondary | ICD-10-CM

## 2016-10-17 MED ORDER — HYDROCHLOROTHIAZIDE 25 MG PO TABS: 25.0000 mg | ORAL_TABLET | Freq: Every day | ORAL | 0 refills | Status: DC

## 2016-10-23 ENCOUNTER — Other Ambulatory Visit: Payer: Self-pay | Admitting: Family Medicine

## 2016-10-23 DIAGNOSIS — Z79899 Other long term (current) drug therapy: Secondary | ICD-10-CM

## 2016-10-23 DIAGNOSIS — E7849 Other hyperlipidemia: Secondary | ICD-10-CM

## 2016-10-24 ENCOUNTER — Other Ambulatory Visit (INDEPENDENT_AMBULATORY_CARE_PROVIDER_SITE_OTHER): Payer: Medicare Other

## 2016-10-24 DIAGNOSIS — E7849 Other hyperlipidemia: Secondary | ICD-10-CM

## 2016-10-24 DIAGNOSIS — Z79899 Other long term (current) drug therapy: Secondary | ICD-10-CM

## 2016-10-24 DIAGNOSIS — E784 Other hyperlipidemia: Secondary | ICD-10-CM

## 2016-10-24 LAB — BASIC METABOLIC PANEL
BUN: 17 mg/dL (ref 6–23)
CO2: 32 mEq/L (ref 19–32)
Calcium: 9.7 mg/dL (ref 8.4–10.5)
Chloride: 105 mEq/L (ref 96–112)
Creatinine, Ser: 1.06 mg/dL (ref 0.40–1.50)
GFR: 72.49 mL/min (ref >60.00)
Glucose, Bld: 91 mg/dL (ref 70–99)
Potassium: 3.7 mEq/L (ref 3.5–5.1)
Sodium: 143 mEq/L (ref 135–145)

## 2016-10-24 LAB — CBC WITH DIFFERENTIAL/PLATELET
Basophils Absolute: 0 10*3/uL (ref 0.0–0.1)
Basophils Relative: 0.5 % (ref 0.0–3.0)
Eosinophils Absolute: 0.4 10*3/uL (ref 0.0–0.7)
Eosinophils Relative: 5.8 % — ABNORMAL HIGH (ref 0.0–5.0)
HCT: 45.4 % (ref 39.0–52.0)
Hemoglobin: 15.4 g/dL (ref 13.0–17.0)
Lymphocytes Relative: 29.9 % (ref 12.0–46.0)
Lymphs Abs: 2.2 10*3/uL (ref 0.7–4.0)
MCHC: 34 g/dL (ref 30.0–36.0)
MCV: 93.9 fl (ref 78.0–100.0)
Monocytes Absolute: 0.4 10*3/uL (ref 0.1–1.0)
Monocytes Relative: 6 % (ref 3.0–12.0)
Neutro Abs: 4.3 10*3/uL (ref 1.4–7.7)
Neutrophils Relative %: 57.8 % (ref 43.0–77.0)
Platelets: 212 10*3/uL (ref 150.0–400.0)
RBC: 4.84 Mil/uL (ref 4.22–5.81)
RDW: 13.5 % (ref 11.5–15.5)
WBC: 7.4 10*3/uL (ref 4.0–10.5)

## 2016-10-24 LAB — HEPATIC FUNCTION PANEL
ALT: 20 U/L (ref 0–53)
AST: 16 U/L (ref 0–37)
Albumin: 4.4 g/dL (ref 3.5–5.2)
Alkaline Phosphatase: 35 U/L — ABNORMAL LOW (ref 39–117)
Bilirubin, Direct: 0.1 mg/dL (ref 0.0–0.3)
Total Bilirubin: 0.6 mg/dL (ref 0.2–1.2)
Total Protein: 7.2 g/dL (ref 6.0–8.3)

## 2016-10-24 LAB — LIPID PANEL
Cholesterol: 133 mg/dL (ref 0–200)
HDL: 38.6 mg/dL — ABNORMAL LOW (ref >39.00)
LDL Cholesterol: 76 mg/dL (ref 0–99)
NonHDL: 94.65
Total CHOL/HDL Ratio: 3
Triglycerides: 95 mg/dL (ref 0.0–149.0)
VLDL: 19 mg/dL (ref 0.0–40.0)

## 2016-10-25 ENCOUNTER — Other Ambulatory Visit (INDEPENDENT_AMBULATORY_CARE_PROVIDER_SITE_OTHER): Payer: Medicare Other

## 2016-10-25 ENCOUNTER — Other Ambulatory Visit: Payer: Self-pay | Admitting: Family Medicine

## 2016-10-25 DIAGNOSIS — Z1211 Encounter for screening for malignant neoplasm of colon: Secondary | ICD-10-CM | POA: Diagnosis not present

## 2016-10-25 LAB — FECAL OCCULT BLOOD, IMMUNOCHEMICAL: Fecal Occult Bld: NEGATIVE

## 2016-11-07 ENCOUNTER — Ambulatory Visit (INDEPENDENT_AMBULATORY_CARE_PROVIDER_SITE_OTHER): Payer: Medicare Other

## 2016-11-07 ENCOUNTER — Ambulatory Visit (INDEPENDENT_AMBULATORY_CARE_PROVIDER_SITE_OTHER): Payer: Medicare Other | Admitting: Family Medicine

## 2016-11-07 VITALS — BP 138/70 | HR 66 | Temp 98.0°F | Ht 68.0 in | Wt 190.5 lb

## 2016-11-07 DIAGNOSIS — Z0000 Encounter for general adult medical examination without abnormal findings: Secondary | ICD-10-CM

## 2016-11-07 DIAGNOSIS — I10 Essential (primary) hypertension: Secondary | ICD-10-CM

## 2016-11-07 MED ORDER — AMLODIPINE BESYLATE 5 MG PO TABS: 5.0000 mg | ORAL_TABLET | Freq: Two times a day (BID) | ORAL | 3 refills | Status: DC

## 2016-11-07 MED ORDER — HYDROCHLOROTHIAZIDE 25 MG PO TABS: 25.0000 mg | ORAL_TABLET | Freq: Every day | ORAL | 3 refills | Status: DC

## 2016-11-07 MED ORDER — SIMVASTATIN 40 MG PO TABS: ORAL_TABLET | ORAL | 3 refills | Status: DC

## 2016-11-07 MED ORDER — SILDENAFIL CITRATE 20 MG PO TABS: ORAL_TABLET | ORAL | 11 refills | Status: DC

## 2016-11-07 NOTE — Progress Notes (Signed)
Dr. Frederico Hamman T. Trudy Kory, MD, [facility] [place], [address] Phone: [phone] Fax: [phone]  11/07/2016  Patient: Eddie Fox, MRN: [id number], DOB: 03-May-1941, 75 y.o.  Primary Physician:  Owens Loffler, MD   No chief complaint on file.  Subjective:   Eddie Fox is a 75 y.o. pleasant patient who presents with the following:  Preventative Health Maintenance Visit:  Health Maintenance Summary Reviewed and updated, unless pt declines services.  Tobacco History Reviewed. Alcohol: No concerns, no excessive use Exercise Habits: Some activity, rec at least 30 mins 5 times a week STD concerns: no risk or activity to increase risk Drug Use: None Encouraged self-testicular check  Overall, minimal complaints.  02/14/2011 Pneumovax.  Health Maintenance  Topic Date Due  . PNA vac Low Risk Adult (2 of 2 - PPSV23) 11/07/2017 (Originally 02/16/2016)  . COLONOSCOPY  03/05/2019  . TETANUS/TDAP  12/23/2019  . INFLUENZA VACCINE  Completed  . ZOSTAVAX  Completed   Immunization History  Administered Date(s) Administered  . Influenza Split 12/25/2011  . Influenza,inj,Quad PF,36+ Mos 08/19/2013, 08/26/2014, 09/14/2015, 09/11/2016  . Pneumococcal Conjugate-13 02/16/2015  . Td 03/24/2002, 12/22/2009   Patient Active Problem List   Diagnosis Date Noted  . 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 Acute appendicitis 05/15/2016  . 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 CHOLECYSTECTOMY, LAPAROSCOPIC, HX OF 12/28/2010  . VITAMIN D DEFICIENCY 01/30/2010  . FATTY LIVER DISEASE 08/12/2009  . ESSENTIAL HYPERTENSION 09/14/2008  . COLONIC POLYPS 07/08/2007  . HYPERLIPIDEMIA 07/08/2007  . ERECTILE DYSFUNCTION 07/08/2007  . 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 Benign neoplasm of colon   . Calculus of kidney   . ED (erectile dysfunction)   . IBS (irritable bowel  syndrome)   . Melanoma ([place])    history of  . Other and unspecified hyperlipidemia   . Other chronic nonalcoholic liver disease   . Unspecified essential hypertension   . Unspecified vitamin D deficiency    Past Surgical History:  Procedure Laterality Date  . AMPUTATION     distal phalanx of right thumb- traumatic  . CHOLECYSTECTOMY  10/03/09   lap  . LAMINECTOMY  1995   L 3-T11 Harrington rods in back  . LAPAROSCOPIC APPENDECTOMY N/A 05/16/2016   Procedure: APPENDECTOMY LAPAROSCOPIC;  Surgeon: Greer Pickerel, MD;  Location: [facility];  Service: General;  Laterality: N/A;  . [place]   right upper abd, Dr. Ronnald Ramp  . ORIF ANKLE FRACTURE  1967   Social History   Social History  . Marital status: Married    Spouse name: N/A  . Number of children: 2  . Years of education: N/A   Occupational History  . retired     retired from [employer] 04/2002   Social History Main Topics  . Smoking status: Never Smoker  . Smokeless tobacco: Never Used  . Alcohol use No  . Drug use: No  . Sexual activity: Yes    Partners: Female   Other Topics Concern  . Not on file   Social History Narrative  . No narrative on file   Family History  Problem Relation Age of Onset  . Heart disease Mother     CHF  . Hypertension Mother   . Diabetes Mother   . Stroke Mother   . Hypertension Father   . Heart disease Father     CHF  .  Hypertension Sister   . Hypertension Sister   . Depression Neg Hx   . Alcohol abuse Neg Hx   . Drug abuse Neg Hx    Allergies  Allergen Reactions  . Pneumococcal Vaccine     REACTION: significant arm swelling, redness    Medication list has been reviewed and updated.   General: Denies fever, chills, sweats. No significant weight loss. Eyes: Denies blurring,significant itching ENT: Denies earache, sore throat, and hoarseness. Cardiovascular: Denies chest pains, palpitations, dyspnea on exertion Respiratory: Denies cough, dyspnea at  rest,wheeezing Breast: no concerns about lumps GI: Denies nausea, vomiting, diarrhea, constipation, change in bowel habits, abdominal pain, melena, hematochezia GU: Denies penile discharge, ED, urinary flow / outflow problems. No STD concerns. Musculoskeletal: Denies back pain, joint pain Derm: Denies rash, itching Neuro: Denies  paresthesias, frequent falls, frequent headaches Psych: Denies depression, anxiety Endocrine: Denies cold intolerance, heat intolerance, polydipsia Heme: Denies enlarged lymph nodes Allergy: No hayfever  Objective:   BP 138/70 (BP Location: Left Arm, Patient Position: Sitting, Cuff Size: Normal)   Pulse 66   Temp 98 F (36.7 C) (Oral)   Ht _0  (1.727 m) Comment: no shoes  Wt 190 lb 8 oz (86.4 kg)   SpO2 95%   BMI 28.97 kg/m  Ideal Body Weight: Weight in (lb) to have BMI = 25: 164.1   Hearing Screening   _1  _2  _3  _4  _5  _6  _7  _8  _9   Right ear:   40 0 0  0    Left ear:   0 0 0  0      Visual Acuity Screening   Right eye Left eye Both eyes  Without correction: _10  With correction:       GEN: well developed, well nourished, no acute distress Eyes: conjunctiva and lids normal, PERRLA, EOMI ENT: TM clear, nares clear, oral exam WNL Neck: supple, no lymphadenopathy, no thyromegaly, no JVD Pulm: clear to auscultation and percussion, respiratory effort normal CV: regular rate and rhythm, S1-S2, no murmur, rub or gallop, no bruits, peripheral pulses normal and symmetric, no cyanosis, clubbing, edema or varicosities GI: soft, non-tender; no hepatosplenomegaly, masses; active bowel sounds all quadrants GU: no hernia, testicular mass, penile discharge Lymph: no cervical, axillary or inguinal adenopathy MSK: gait normal, muscle tone and strength WNL, no joint swelling, effusions, discoloration, crepitus  SKIN: clear, good turgor, color WNL, no rashes, lesions, or ulcerations Neuro: normal mental status, normal  strength, sensation, and motion Psych: alert; oriented to person, place and time, normally interactive and not anxious or depressed in appearance. All labs reviewed with patient.  Lipids:    Component Value Date/Time   CHOL 133 10/24/2016 1025   TRIG 95.0 10/24/2016 1025   HDL 38.60 (L) 10/24/2016 1025   LDLDIRECT 171.9 07/08/2007 1044   VLDL 19.0 10/24/2016 1025   CHOLHDL 3 10/24/2016 1025   CBC: CBC Latest Ref Rng & Units 10/24/2016 05/24/2016 05/17/2016  WBC 4.0 - 10.5 K/uL 7.4 8.8 13.9(H)  Hemoglobin 13.0 - 17.0 g/dL 15.4 15.2 12.7(L)  Hematocrit 39.0 - 52.0 % 45.4 44.8 36.8(L)  Platelets 150.0 - 400.0 K/uL 212.0 265.0 818    Basic Metabolic Panel:    Component Value Date/Time   NA 143 10/24/2016 1025   K 3.7 10/24/2016 1025   CL 105 10/24/2016 1025   CO2 32 10/24/2016 1025   BUN 17 10/24/2016 1025   CREATININE 1.06 10/24/2016 1025   GLUCOSE 91 10/24/2016 1025   CALCIUM 9.7 10/24/2016 1025  Hepatic Function Latest Ref Rng & Units 10/24/2016 05/15/2016 06/07/2015  Total Protein 6.0 - 8.3 g/dL 7.2 7.2 6.8  Albumin 3.5 - 5.2 g/dL 4.4 4.3 4.5  AST 0 - 37 U/L _0 ALT 0 - 53 U/L _1 Alk Phosphatase 39 - 117 U/L 35(L) 39 39  Total Bilirubin 0.2 - 1.2 mg/dL 0.6 0.8 0.6  Bilirubin, Direct 0.0 - 0.3 mg/dL 0.1 - 0.1    Lab Results  Component Value Date   TSH 3.27 05/12/2014   Lab Results  Component Value Date   PSA 1.66 05/24/2016   PSA 1.17 06/07/2015   PSA 2.35 05/12/2014    Assessment and Plan:   Health Maintenance Exam: The patient's preventative maintenance and recommended screening tests for an annual wellness exam were reviewed in full today. Brought up to date unless services declined.  Counselled on the importance of diet, exercise, and its role in overall health and mortality. The patient's FH and SH was reviewed, including their home life, tobacco status, and drug and alcohol status.  Follow-up in 1 year for physical exam or additional follow-up  below.  Healthcare maintenance  Essential hypertension, benign - Plan: hydrochlorothiazide (HYDRODIURIL) 25 MG tablet   Doing well.  Refilled all his medication.  Follow-up: No Follow-up on file.  Meds ordered this encounter  Medications  . hydrochlorothiazide (HYDRODIURIL) 25 MG tablet    Sig: Take 1 tablet (25 mg total) by mouth daily.    Dispense:  90 tablet    Refill:  3  . amLODipine (NORVASC) 5 MG tablet    Sig: Take 1 tablet (5 mg total) by mouth 2 (two) times daily.    Dispense:  180 tablet    Refill:  3  . simvastatin (ZOCOR) 40 MG tablet    Sig: TAKE ONE TABLET BY MOUTH ONCE DAILY EVERY NIGHT    Dispense:  90 tablet    Refill:  3  . sildenafil (REVATIO) 20 MG tablet    Sig: Generic Revatio / Sildanefil 20 mg. 2 - 5 tabs 30 mins prior to intercourse.    Dispense:  6 tablet    Refill:  11   Medications Discontinued During This Encounter  Medication Reason  . hydrochlorothiazide (HYDRODIURIL) 25 MG tablet Reorder  . amLODipine (NORVASC) 5 MG tablet Reorder  . simvastatin (ZOCOR) 40 MG tablet Reorder   No orders of the defined types were placed in this encounter.   Signed,  Maud Deed. Lorayne Getchell, MD     Medication List       Accurate as of 11/07/16 11:59 PM. Always use your most recent med list.          acetaminophen 500 MG tablet Commonly known as:  TYLENOL Take 2 tablets (1,000 mg total) by mouth every 6 (six) hours.   amLODipine 5 MG tablet Commonly known as:  NORVASC Take 1 tablet (5 mg total) by mouth 2 (two) times daily.   aspirin 325 MG tablet Take 325 mg by mouth daily.   CENTRUM SILVER ULTRA MENS Tabs Take 1 tablet by mouth daily.   hydrochlorothiazide 25 MG tablet Commonly known as:  HYDRODIURIL Take 1 tablet (25 mg total) by mouth daily.   sildenafil 20 MG tablet Commonly known as:  REVATIO Generic Revatio / Sildanefil 20 mg. 2 - 5 tabs 30 mins prior to intercourse.   simvastatin 40 MG tablet Commonly known as:  ZOCOR TAKE ONE  TABLET BY MOUTH ONCE DAILY EVERY NIGHT

## 2016-11-07 NOTE — Progress Notes (Signed)
Subjective:   Eddie Fox is a 75 y.o. male who presents for Medicare Annual/Subsequent preventive examination.  Review of Systems:  N/A Cardiac Risk Factors include: advanced age (>74men, >81 women);male gender;dyslipidemia;hypertension     Objective:    Vitals: BP 138/70 (BP Location: Left Arm, Patient Position: Sitting, Cuff Size: Normal)   Pulse 66   Temp 98 F (36.7 C) (Oral)   Ht 5\' 8"  (1.727 m) Comment: no shoes  Wt 190 lb 8 oz (86.4 kg)   SpO2 95%   BMI 28.97 kg/m   Body mass index is 28.97 kg/m.  Tobacco History  Smoking Status  . Never Smoker  Smokeless Tobacco  . Never Used     Counseling given: No   Past Medical History:  Diagnosis Date  . Benign neoplasm of colon   . Calculus of kidney   . ED (erectile dysfunction)   . IBS (irritable bowel syndrome)   . Melanoma ([place])    history of  . Other and unspecified hyperlipidemia   . Other chronic nonalcoholic liver disease   . Unspecified essential hypertension   . Unspecified vitamin D deficiency    Past Surgical History:  Procedure Laterality Date  . AMPUTATION     distal phalanx of right thumb- traumatic  . CHOLECYSTECTOMY  10/03/09   lap  . LAMINECTOMY  1995   L 3-T11 Harrington rods in back  . LAPAROSCOPIC APPENDECTOMY N/A 05/16/2016   Procedure: APPENDECTOMY LAPAROSCOPIC;  Surgeon: Greer Pickerel, MD;  Location: [facility];  Service: General;  Laterality: N/A;  . [employer]   right upper abd, Dr. Ronnald Ramp  . ORIF ANKLE FRACTURE  1967   Family History  Problem Relation Age of Onset  . Heart disease Mother     CHF  . Hypertension Mother   . Diabetes Mother   . Stroke Mother   . Hypertension Father   . Heart disease Father     CHF  . Hypertension Sister   . Hypertension Sister   . Depression Neg Hx   . Alcohol abuse Neg Hx   . Drug abuse Neg Hx    History  Sexual Activity  . Sexual activity: Yes  . Partners: Female    Outpatient Encounter Prescriptions as of 11/07/2016    Medication Sig  . acetaminophen (TYLENOL) 500 MG tablet Take 2 tablets (1,000 mg total) by mouth every 6 (six) hours.  Marland Kitchen amLODipine (NORVASC) 5 MG tablet TAKE ONE TABLET BY MOUTH TWICE DAILY  . aspirin 325 MG tablet Take 325 mg by mouth daily.  . hydrochlorothiazide (HYDRODIURIL) 25 MG tablet Take 1 tablet (25 mg total) by mouth daily.  . Multiple Vitamins-Minerals (CENTRUM SILVER ULTRA MENS) TABS Take 1 tablet by mouth daily.  . simvastatin (ZOCOR) 40 MG tablet TAKE ONE TABLET BY MOUTH ONCE DAILY EVERY NIGHT   No facility-administered encounter medications on file as of 11/07/2016.     Activities of Daily Living In your present state of health, do you have any difficulty performing the following activities: 11/07/2016 05/16/2016  Hearing? Y N  Vision? N N  Difficulty concentrating or making decisions? Y N  Walking or climbing stairs? N N  Dressing or bathing? N N  Doing errands, shopping? N N  Preparing Food and eating ? N -  Using the Toilet? N -  In the past six months, have you accidently leaked urine? N -  Do you have problems with loss of bowel control? N -  Managing  your Medications? N -  Managing your Finances? N -  Housekeeping or managing your Housekeeping? N -  Some recent data might be hidden    Patient Care Team: Owens Loffler, MD as PCP - General Danella Sensing, MD as Consulting Physician (Dermatology) Katy Apo, MD as Consulting Physician (Ophthalmology)   Assessment:     Hearing Screening   125Hz  250Hz  500Hz  1000Hz  2000Hz  3000Hz  4000Hz  6000Hz  8000Hz   Right ear:   40 0 0  0    Left ear:   0 0 0  0      Visual Acuity Screening   Right eye Left eye Both eyes  Without correction: 20/20 20/20 20/20   With correction:     Comments: Last vision exam with Dr. Prudencio Burly in October 2016   Exercise Activities and Dietary recommendations Current Exercise Habits: Home exercise routine, Type of exercise: walking, Time (Minutes): 30, Frequency (Times/Week): 3, Weekly  Exercise (Minutes/Week): 90, Intensity: Mild, Exercise limited by: None identified  Goals    . Increase physical activity          Starting 11/07/2016, I will continue to walk at least 30 min 3 days per week as weather permits.       Fall Risk Fall Risk  11/07/2016 06/13/2015 05/19/2014  Falls in the past year? No No No   Depression Screen PHQ 2/9 Scores 11/07/2016 11/07/2016 06/13/2015 05/19/2014  PHQ - 2 Score 0 0 0 0    Cognitive Function MMSE - Mini Mental State Exam 11/07/2016  Orientation to time 5  Orientation to Place 5  Registration 3  Attention/ Calculation 0  Recall 3  Language- name 2 objects 0  Language- repeat 1  Language- follow 3 step command 3  Language- read & follow direction 0  Write a sentence 0  Copy design 0  Total score 20     PLEASE NOTE: A Mini-Cog screen was completed. Maximum score is 20. A value of 0 denotes this part of [place] MMSE was not completed or the patient failed this part of the Mini-Cog screening.   Mini-Cog Screening Orientation to Time - Max 5 pts Orientation to Place - Max 5 pts Registration - Max 3 pts Recall - Max 3 pts Language Repeat - Max 1 pts Language Follow 3 Step Command - Max 3 pts     Immunization History  Administered Date(s) Administered  . Influenza Split 12/25/2011  . Influenza,inj,Quad PF,36+ Mos 08/19/2013, 08/26/2014, 09/14/2015, 09/11/2016  . Pneumococcal Conjugate-13 02/16/2015  . Td 03/24/2002, 12/22/2009   Screening Tests Health Maintenance  Topic Date Due  . PNA vac Low Risk Adult (2 of 2 - PPSV23) 11/07/2017 (Originally 02/16/2016)  . COLONOSCOPY  03/05/2019  . TETANUS/TDAP  12/23/2019  . INFLUENZA VACCINE  Completed  . ZOSTAVAX  Completed      Plan:     I have personally reviewed and addressed the Medicare Annual Wellness questionnaire and have noted the following in the patient's chart:  A. Medical and social history B. Use of alcohol, tobacco or illicit drugs  C. Current medications and  supplements D. Functional ability and status E.  Nutritional status F.  Physical activity G. Advance directives H. List of other physicians I.  Hospitalizations, surgeries, and ER visits in previous 12 months J.  [place] to include hearing, vision, cognitive, depression L. Referrals and appointments - none  In addition, I have reviewed and discussed with patient certain preventive protocols, quality metrics, and best practice recommendations. A written personalized care  plan for preventive services as well as general preventive health recommendations were provided to patient.  See attached scanned questionnaire for additional information.   Signed,   Lindell Noe, MHA, BS, LPN Health Coach

## 2016-11-07 NOTE — Patient Instructions (Signed)
Mr. Tlatelpa , Thank you for taking time to come for your Medicare Wellness Visit. I appreciate your ongoing commitment to your health goals. Please review the following plan we discussed and let me know if I can assist you in the future.   These are the goals we discussed: Goals    . Increase physical activity          Starting 11/07/2016, I will continue to walk at least 30 min 3 days per week as weather permits.        This is a list of the screening recommended for you and due dates:  Health Maintenance  Topic Date Due  . Pneumonia vaccines (2 of 2 - PPSV23) 11/07/2017*  . Colon Cancer Screening  03/05/2019  . Tetanus Vaccine  12/23/2019  . Flu Shot  Completed  . Shingles Vaccine  Completed  *Topic was postponed. The date shown is not the original due date.   Preventive Care for Adults  A healthy lifestyle and preventive care can promote health and wellness. Preventive health guidelines for adults include the following key practices.  . A routine yearly physical is a good way to check with your health care provider about your health and preventive screening. It is a chance to share any concerns and updates on your health and to receive a thorough exam.  . Visit your dentist for a routine exam and preventive care every 6 months. Brush your teeth twice a day and floss once a day. Good oral hygiene prevents tooth decay and gum disease.  . The frequency of eye exams is based on your age, health, family medical history, use  of contact lenses, and other factors. Follow your health care provider's ecommendations for frequency of eye exams.  . Eat a healthy diet. Foods like vegetables, fruits, whole grains, low-fat dairy products, and lean protein foods contain the nutrients you need without too many calories. Decrease your intake of foods high in solid fats, added sugars, and salt. Eat the right amount of calories for you. Get information about a proper diet from your health care  provider, if necessary.  . Regular physical exercise is one of the most important things you can do for your health. Most adults should get at least 150 minutes of moderate-intensity exercise (any activity that increases your heart rate and causes you to sweat) each week. In addition, most adults need muscle-strengthening exercises on 2 or more days a week.  Silver Sneakers may be a benefit available to you. To determine eligibility, you may visit the website: www.silversneakers.com or contact program at (303)711-9908 Mon-Fri between 8AM-8PM.   . Maintain a healthy weight. The body mass index (BMI) is a screening tool to identify possible weight problems. It provides an estimate of body fat based on height and weight. Your health care provider can find your BMI and can help you achieve or maintain a healthy weight.   For adults 20 years and older: ? A BMI below 18.5 is considered underweight. ? A BMI of 18.5 to 24.9 is normal. ? A BMI of 25 to 29.9 is considered overweight. ? A BMI of 30 and above is considered obese.   . Maintain normal blood lipids and cholesterol levels by exercising and minimizing your intake of saturated fat. Eat a balanced diet with plenty of fruit and vegetables. Blood tests for lipids and cholesterol should begin at age 58 and be repeated every 5 years. If your lipid or cholesterol levels are high, you are  over 31, or you are at high risk for heart disease, you may need your cholesterol levels checked more frequently. Ongoing high lipid and cholesterol levels should be treated with medicines if diet and exercise are not working.  . If you smoke, find out from your health care provider how to quit. If you do not use tobacco, please do not start.  . If you choose to drink alcohol, please do not consume more than 2 drinks per day. One drink is considered to be 12 ounces (355 mL) of beer, 5 ounces (148 mL) of wine, or 1.5 ounces (44 mL) of liquor.  . If you are 64-79 years  old, ask your health care provider if you should take aspirin to prevent strokes.  . Use sunscreen. Apply sunscreen liberally and repeatedly throughout the day. You should seek shade when your shadow is shorter than you. Protect yourself by wearing long sleeves, pants, a wide-brimmed hat, and sunglasses year round, whenever you are outdoors.  . Once a month, do a whole body skin exam, using a mirror to look at the skin on your back. Tell your health care provider of new moles, moles that have irregular borders, moles that are larger than a pencil eraser, or moles that have changed in shape or color.

## 2016-11-07 NOTE — Progress Notes (Signed)
PCP notes:   Health maintenance:  PPSV23 - due to pt's hx, pt will discuss with PCP  Abnormal screenings:   Hearing - failed  Patient concerns:   Pt requests HCTZ is refilled with 90 day supply instead of 30 day supply.   Nurse concerns:  None  Next PCP appt:   11/07/16 @ 1445

## 2016-11-07 NOTE — Progress Notes (Signed)
Pre visit review using our clinic review tool, if applicable. No additional management support is needed unless otherwise documented below in the visit note. 

## 2016-11-09 ENCOUNTER — Encounter: Payer: Self-pay | Admitting: Family Medicine

## 2016-11-09 NOTE — Progress Notes (Signed)
I reviewed health advisor's note, was available for consultation, and agree with documentation and plan.   Signed,  Shanan Mcmiller T. Saraih Lorton, MD  

## 2017-01-24 DIAGNOSIS — C44519 Basal cell carcinoma of skin of other part of trunk: Secondary | ICD-10-CM | POA: Diagnosis not present

## 2017-01-24 DIAGNOSIS — L821 Other seborrheic keratosis: Secondary | ICD-10-CM | POA: Diagnosis not present

## 2017-01-24 DIAGNOSIS — D225 Melanocytic nevi of trunk: Secondary | ICD-10-CM | POA: Diagnosis not present

## 2017-01-24 DIAGNOSIS — L570 Actinic keratosis: Secondary | ICD-10-CM | POA: Diagnosis not present

## 2017-01-24 DIAGNOSIS — D485 Neoplasm of uncertain behavior of skin: Secondary | ICD-10-CM | POA: Diagnosis not present

## 2017-01-24 DIAGNOSIS — Z85828 Personal history of other malignant neoplasm of skin: Secondary | ICD-10-CM | POA: Diagnosis not present

## 2017-02-28 ENCOUNTER — Ambulatory Visit (INDEPENDENT_AMBULATORY_CARE_PROVIDER_SITE_OTHER): Payer: Medicare Other | Admitting: Family Medicine

## 2017-02-28 ENCOUNTER — Encounter: Payer: Self-pay | Admitting: Family Medicine

## 2017-02-28 VITALS — BP 130/60 | HR 56 | Temp 98.2°F | Ht 68.0 in | Wt 194.8 lb

## 2017-02-28 DIAGNOSIS — L820 Inflamed seborrheic keratosis: Secondary | ICD-10-CM

## 2017-02-28 DIAGNOSIS — H6981 Other specified disorders of Eustachian tube, right ear: Secondary | ICD-10-CM

## 2017-02-28 NOTE — Progress Notes (Signed)
Dr. Frederico Hamman T. Jonaya Freshour, MD, [facility] and Sports Medicine [place], [address] Phone: [phone] Fax: [phone]  02/28/2017  Patient: Eddie Fox, MRN: [id number], DOB: 1941/05/22, 76 y.o.  Primary Physician:  Owens Loffler, MD   Chief Complaint  Patient presents with  . remove mole on neck  . right ear pain   Subjective:   Eddie Fox is a 76 y.o. very pleasant male patient who presents with the following:  R ear  L neck - cryo  Past Medical History, Surgical History, Social History, Family History, Problem List, Medications, and Allergies have been reviewed and updated if relevant.  Patient Active Problem List   Diagnosis Date Noted  . 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 CHOLECYSTECTOMY, LAPAROSCOPIC, HX OF 12/28/2010  . VITAMIN D DEFICIENCY 01/30/2010  . FATTY LIVER DISEASE 08/12/2009  . ESSENTIAL HYPERTENSION 09/14/2008  . COLONIC POLYPS 07/08/2007  . HYPERLIPIDEMIA 07/08/2007  . ERECTILE DYSFUNCTION 07/08/2007  . 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 Benign neoplasm of colon   . Calculus of kidney   . ED (erectile dysfunction)   . IBS (irritable bowel syndrome)   . Melanoma ([place])    history of  . Other and unspecified hyperlipidemia   . Other chronic nonalcoholic liver disease   . Unspecified essential hypertension   . Unspecified vitamin D deficiency     Past Surgical History:  Procedure Laterality Date  . AMPUTATION     distal phalanx of right thumb- traumatic  . CHOLECYSTECTOMY  10/03/09   lap  . LAMINECTOMY  1995   L 3-T11 Harrington rods in back  . LAPAROSCOPIC APPENDECTOMY N/A 05/16/2016   Procedure: APPENDECTOMY LAPAROSCOPIC;  Surgeon: Greer Pickerel, MD;  Location: [facility];  Service: General;  Laterality: N/A;  . [place]   right upper abd, Dr. Ronnald Ramp  . ORIF ANKLE FRACTURE  1967      Social History   Social History  . Marital status: Married    Spouse name: N/A  . Number of children: 2  . Years of education: N/A   Occupational History  . retired     retired from [employer] 04/2002   Social History Main Topics  . Smoking status: Never Smoker  . Smokeless tobacco: Never Used  . Alcohol use No  . Drug use: No  . Sexual activity: Yes    Partners: Female   Other Topics Concern  . Not on file   Social History Narrative  . No narrative on file    Family History  Problem Relation Age of Onset  . Heart disease Mother     CHF  . Hypertension Mother   . Diabetes Mother   . Stroke Mother   . Hypertension Father   . Heart disease Father     CHF  . Hypertension Sister   . Hypertension Sister   . Depression Neg Hx   . Alcohol abuse Neg Hx   . Drug abuse Neg Hx     Allergies  Allergen Reactions  . Pneumococcal Vaccine     REACTION: significant arm swelling, redness    Medication list reviewed and updated in full in [place].   GEN: No acute illnesses, no fevers, chills. GI: No n/v/d, eating normally Pulm: No SOB Interactive and getting along well at home.  Otherwise, ROS is as  per the HPI.  Objective:   BP 130/60 (BP Location: Left Arm, Patient Position: Sitting, Cuff Size: Normal)   Pulse (!) 56   Temp 98.2 F (36.8 C) (Oral)   Ht 5\' 8"  (1.727 m)   Wt 194 lb 12 oz (88.3 kg)   SpO2 96%   BMI 29.61 kg/m   GEN: WDWN, NAD, Non-toxic, A & O x 3 HEENT: Atraumatic, Normocephalic. Neck supple. No masses, No LAD. Ears and Nose: No external deformity. Serous fluid R TM, L normal. EXTR: No c/c/e NEURO Normal gait.  PSYCH: Normally interactive. Conversant. Not depressed or anxious appearing.  Calm demeanor.   Roughened seb K L nape of neck.  Laboratory and Imaging Data:  Assessment and Plan:   Eustachian tube dysfunction, right  Seborrheic keratoses, inflamed  Reassurance R ear - cont allergy  meds  Cryotherapy  Reason: inflammed, irritating lesion L neck - seb K Location: L neck  Liquid nitrogen was applied using the liquid nitrogen gun without difficulty with an otoscope tip for concentration. Tolerated well without complications.   Follow-up: prn  Medications Discontinued During This Encounter  Medication Reason  . acetaminophen (TYLENOL) 500 MG tablet Patient has not taken in last 30 days   Signed,  Talani Brazee T. Charlottie Peragine, MD   Allergies as of 02/28/2017      Reactions   Pneumococcal Vaccine    REACTION: significant arm swelling, redness      Medication List       Accurate as of 02/28/17  1:50 PM. Always use your most recent med list.          amLODipine 5 MG tablet Commonly known as:  NORVASC Take 1 tablet (5 mg total) by mouth 2 (two) times daily.   aspirin 325 MG tablet Take 325 mg by mouth daily.   CENTRUM SILVER ULTRA MENS Tabs Take 1 tablet by mouth daily.   hydrochlorothiazide 25 MG tablet Commonly known as:  HYDRODIURIL Take 1 tablet (25 mg total) by mouth daily.   sildenafil 20 MG tablet Commonly known as:  REVATIO Generic Revatio / Sildanefil 20 mg. 2 - 5 tabs 30 mins prior to intercourse.   simvastatin 40 MG tablet Commonly known as:  ZOCOR TAKE ONE TABLET BY MOUTH ONCE DAILY EVERY NIGHT

## 2017-02-28 NOTE — Progress Notes (Signed)
Pre visit review using our clinic review tool, if applicable. No additional management support is needed unless otherwise documented below in the visit note. 

## 2017-04-01 ENCOUNTER — Other Ambulatory Visit: Payer: Self-pay | Admitting: *Deleted

## 2017-04-01 DIAGNOSIS — C181 Malignant neoplasm of appendix: Secondary | ICD-10-CM

## 2017-04-02 ENCOUNTER — Telehealth: Payer: Self-pay | Admitting: Oncology

## 2017-04-02 NOTE — Telephone Encounter (Signed)
Called patient to inform him of added lab appointment for 4.23.18. LVM

## 2017-04-08 ENCOUNTER — Other Ambulatory Visit (HOSPITAL_BASED_OUTPATIENT_CLINIC_OR_DEPARTMENT_OTHER): Payer: Medicare Other

## 2017-04-08 ENCOUNTER — Ambulatory Visit (HOSPITAL_COMMUNITY)
Admission: RE | Admit: 2017-04-08 | Discharge: 2017-04-08 | Disposition: A | Discharge status: Home / Self Care | Payer: Medicare Other | Source: Ambulatory Visit | Attending: Oncology | Admitting: Oncology

## 2017-04-08 ENCOUNTER — Encounter (HOSPITAL_COMMUNITY): Payer: Self-pay

## 2017-04-08 DIAGNOSIS — I700 Atherosclerosis of aorta: Secondary | ICD-10-CM | POA: Diagnosis not present

## 2017-04-08 DIAGNOSIS — N200 Calculus of kidney: Secondary | ICD-10-CM | POA: Insufficient documentation

## 2017-04-08 DIAGNOSIS — I2510 Atherosclerotic heart disease of native coronary artery without angina pectoris: Secondary | ICD-10-CM | POA: Insufficient documentation

## 2017-04-08 DIAGNOSIS — C7A020 Malignant carcinoid tumor of the appendix: Secondary | ICD-10-CM

## 2017-04-08 DIAGNOSIS — C181 Malignant neoplasm of appendix: Secondary | ICD-10-CM | POA: Diagnosis not present

## 2017-04-08 LAB — COMPREHENSIVE METABOLIC PANEL
ALT: 22 U/L (ref 0–55)
AST: 17 U/L (ref 5–34)
Albumin: 4.1 g/dL (ref 3.5–5.0)
Alkaline Phosphatase: 43 U/L (ref 40–150)
Anion Gap: 11 mEq/L (ref 3–11)
BUN: 17.8 mg/dL (ref 7.0–26.0)
CO2: 29 mEq/L (ref 22–29)
Calcium: 9.3 mg/dL (ref 8.4–10.4)
Chloride: 105 mEq/L (ref 98–109)
Creatinine: 1.1 mg/dL (ref 0.7–1.3)
EGFR: 66 mL/min/{1.73_m2} — ABNORMAL LOW (ref >90)
Glucose: 87 mg/dl (ref 70–140)
Potassium: 3.2 mEq/L — ABNORMAL LOW (ref 3.5–5.1)
Sodium: 145 mEq/L (ref 136–145)
Total Bilirubin: 0.55 mg/dL (ref 0.20–1.20)
Total Protein: 6.9 g/dL (ref 6.4–8.3)

## 2017-04-08 LAB — CEA (IN HOUSE-CHCC): CEA (CHCC-In House): <1 ng/mL (ref 0.00–5.00)

## 2017-04-08 MED ORDER — IOPAMIDOL (ISOVUE-300) INJECTION 61%
INTRAVENOUS | Status: AC
  Administered 2017-04-08: 100 mL via INTRAVENOUS
  Filled 2017-04-08: qty 100

## 2017-04-09 ENCOUNTER — Ambulatory Visit (HOSPITAL_BASED_OUTPATIENT_CLINIC_OR_DEPARTMENT_OTHER): Payer: Medicare Other | Admitting: Oncology

## 2017-04-09 VITALS — BP 135/47 | HR 51 | Temp 98.1°F | Resp 18 | Ht 68.0 in | Wt 192.0 lb

## 2017-04-09 DIAGNOSIS — I10 Essential (primary) hypertension: Secondary | ICD-10-CM | POA: Diagnosis not present

## 2017-04-09 DIAGNOSIS — C181 Malignant neoplasm of appendix: Secondary | ICD-10-CM | POA: Diagnosis not present

## 2017-04-09 LAB — CEA: CEA: 1.1 ng/mL (ref 0.0–4.7)

## 2017-04-09 NOTE — Progress Notes (Signed)
  [facility] OFFICE PROGRESS NOTE   Diagnosis: Goblet cell carcinoid of the appendix  INTERVAL HISTORY:   Eddie Fox returns as scheduled. He feels well. No diarrhea. No flushing. Good appetite. He reports intermittent pain at the left lower neck with certain positions for the past year. No other complaint.  Objective:  Vital signs in last 24 hours:  Blood pressure (!) 135/47, pulse (!) 51, temperature 98.1 F (36.7 C), temperature source Oral, resp. rate 18, height 5\' 8"  (1.727 m), weight 192 lb (87.1 kg), SpO2 98 %.    HEENT: Neck without mass or tenderness. Lymphatics: No cervical, supra-clavicular, axillary, or inguinal nodes Resp: Lungs clear bilaterally Cardio: Regular rate and rhythm GI: No hepatosplenomegaly, no mass, nontender Vascular: Trace edema at the right greater than left lower leg   Lab Results:  CEA on 04/08/2017-less than   Imaging:  Ct Abdomen Pelvis W Contrast  Result Date: 04/08/2017 CLINICAL DATA:  Appendiceal cancer. EXAM: CT ABDOMEN AND PELVIS WITH CONTRAST TECHNIQUE: Multidetector CT imaging of the abdomen and pelvis was performed using the standard protocol following bolus administration of intravenous contrast. CONTRAST:  148mL ISOVUE-300 IOPAMIDOL (ISOVUE-300) INJECTION 61% COMPARISON:  05/15/2016. FINDINGS: Lower chest: Lung bases show no acute findings. Heart is enlarged. Coronary artery calcification. No pericardial or pleural effusion. Hepatobiliary: There is atrophy of the left hepatic lobe, similar to 09/22/2005. Liver is otherwise unremarkable. Cholecystectomy. No biliary ductal dilatation. Pancreas: Negative. Spleen: Negative. Adrenals/Urinary Tract: Adrenal glands are unremarkable. Stones are seen in the kidneys bilaterally. Low-attenuation lesions measure up to 11 mm in the left kidney, too small to characterize but cysts are most likely. Ureters are decompressed. Bladder is grossly unremarkable. Stomach/Bowel: Stomach, small  bowel and colon are unremarkable. Appendectomy. Vascular/Lymphatic: Atherosclerotic calcification of the arterial vasculature without abdominal aortic aneurysm. No pathologically enlarged lymph nodes. Reproductive: Prostate is mildly prominent. Other: No free fluid. Small bilateral inguinal hernias contain fat. Mesenteries and peritoneum are unremarkable. Musculoskeletal: No worrisome lytic or sclerotic lesions. Postoperative changes are seen in the spine. IMPRESSION: 1. Appendectomy.  No evidence of metastatic disease. 2. Aortic atherosclerosis (ICD10-170.0). Coronary artery calcification. 3. Bilateral renal stones. 4. Prostate is mildly prominent. Electronically Signed   By: Lorin Picket M.D.   On: 04/08/2017 14:03    Medications: I have reviewed the patient's current medications.  Assessment/Plan: 1. 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 T3 NX ? CT 04/08/2017-no evidence of recurrent carcinoma  2. Acute appendicitis, status post an appendectomy 05/16/2016  3.   Hypertension  4.  History of an abdominal wall melanoma in 1995  5.  Hyperlipidemia    Disposition:  Eddie Fox remains in clinical remission from the appendiceal goblet cell tumor. He will return for an office visit and CEA in one year. We will discuss the indication for additional surveillance CT scans when he returns next year.  15 minutes were spent with the patient today. The majority of the time was used for counseling and coordination of care.  Betsy Coder, MD  04/09/2017  12:20 PM

## 2017-04-10 ENCOUNTER — Telehealth: Payer: Self-pay | Admitting: Oncology

## 2017-04-10 ENCOUNTER — Telehealth: Payer: Self-pay | Admitting: *Deleted

## 2017-04-10 LAB — CHROMOGRANIN A: Chromogranin A: 2 nmol/L (ref 0–5)

## 2017-04-10 NOTE — Telephone Encounter (Signed)
CT report routed to PCP electronically, per Dr. Gearldine Shown request. "Please note coronary calcifications on CT."

## 2017-04-10 NOTE — Telephone Encounter (Signed)
Patient bypassed scheduling area. Appointment schedule and letter mailed to patient.

## 2017-04-11 ENCOUNTER — Other Ambulatory Visit: Payer: Self-pay | Admitting: *Deleted

## 2017-04-11 DIAGNOSIS — C181 Malignant neoplasm of appendix: Secondary | ICD-10-CM

## 2017-04-15 ENCOUNTER — Telehealth: Payer: Self-pay

## 2017-04-15 DIAGNOSIS — N400 Enlarged prostate without lower urinary tract symptoms: Secondary | ICD-10-CM

## 2017-04-15 NOTE — Telephone Encounter (Signed)
I don't completely understand the message.   His PSA is normal and has been for years.   Lab Results  Component Value Date   PSA 1.66 05/24/2016   PSA 1.17 06/07/2015   PSA 2.35 05/12/2014    If he really would like to redraw it, then that is not unreasonable.  PSA, Free and Total: enlarged prostate

## 2017-04-15 NOTE — Telephone Encounter (Signed)
Pt left v/m; pt had seen Dr Benay Spice and had CT scan; prostate had enlarged. Pt request to have blood test for prostate on 04/16/17 at 3 pm at [facility] lab. Pt request cb.

## 2017-04-16 ENCOUNTER — Other Ambulatory Visit (INDEPENDENT_AMBULATORY_CARE_PROVIDER_SITE_OTHER): Payer: Medicare Other

## 2017-04-16 DIAGNOSIS — N400 Enlarged prostate without lower urinary tract symptoms: Secondary | ICD-10-CM | POA: Diagnosis not present

## 2017-04-17 LAB — PSA, TOTAL WITH REFLEX TO PSA, FREE: PSA, Total: 2.5 ng/mL (ref <=4.0)

## 2017-05-29 ENCOUNTER — Ambulatory Visit (INDEPENDENT_AMBULATORY_CARE_PROVIDER_SITE_OTHER): Payer: Medicare Other | Admitting: Family Medicine

## 2017-05-29 ENCOUNTER — Encounter: Payer: Self-pay | Admitting: Family Medicine

## 2017-05-29 VITALS — BP 126/80 | HR 59 | Temp 98.3°F | Ht 68.0 in | Wt 188.2 lb

## 2017-05-29 DIAGNOSIS — M2392 Unspecified internal derangement of left knee: Secondary | ICD-10-CM

## 2017-05-29 DIAGNOSIS — M1712 Unilateral primary osteoarthritis, left knee: Secondary | ICD-10-CM | POA: Diagnosis not present

## 2017-05-29 NOTE — Progress Notes (Signed)
Dr. Frederico Hamman T. Deondray Ospina, MD, [facility] and Sports Medicine [place], [address] Phone: [phone] Fax: [phone]  05/29/2017  Patient: Eddie Fox, MRN: [id number], DOB: February 19, 1941, 76 y.o.  Primary Physician:  Owens Loffler, MD   Chief Complaint  Patient presents with  . Knee Pain    Left   Subjective:   Eddie Fox is a 76 y.o. very pleasant male patient who presents with the following:  Medial pain with moving mulch. Has been about 3 weeks. Got improved, then got an ace bandage over the weekend. He initially had this pain relatively acutely about 3 weeks ago with moving mulch with his foot.  He had some swelling, this has improved quite a bit.  He is an Ace bandage over the weekend which seems to help.  Last night did take some Aleve which seemed to help also.  Overall, his symptoms have improved over time.  Past Medical History, Surgical History, Social History, Family History, Problem List, Medications, and Allergies have been reviewed and updated if relevant.  Patient Active Problem List   Diagnosis Date Noted  . 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 CHOLECYSTECTOMY, LAPAROSCOPIC, HX OF 12/28/2010  . VITAMIN D DEFICIENCY 01/30/2010  . FATTY LIVER DISEASE 08/12/2009  . ESSENTIAL HYPERTENSION 09/14/2008  . COLONIC POLYPS 07/08/2007  . HYPERLIPIDEMIA 07/08/2007  . ERECTILE DYSFUNCTION 07/08/2007  . 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 Benign neoplasm of colon   . Calculus of kidney   . ED (erectile dysfunction)   . IBS (irritable bowel syndrome)   . Melanoma ([place])    history of  . Other and unspecified hyperlipidemia   . Other chronic nonalcoholic liver disease   . Unspecified essential hypertension   . Unspecified vitamin D deficiency     Past Surgical History:  Procedure Laterality Date  . AMPUTATION     distal phalanx of right thumb- traumatic  . CHOLECYSTECTOMY  10/03/09   lap  . LAMINECTOMY  1995   L 3-T11 Harrington rods in back  . LAPAROSCOPIC APPENDECTOMY N/A 05/16/2016   Procedure: APPENDECTOMY LAPAROSCOPIC;  Surgeon: Greer Pickerel, MD;  Location: [facility];  Service: General;  Laterality: N/A;  . [place]   right upper abd, Dr. Ronnald Ramp  . ORIF ANKLE FRACTURE  1967    Social History   Social History  . Marital status: Married    Spouse name: N/A  . Number of children: 2  . Years of education: N/A   Occupational History  . retired     retired from [employer] 04/2002   Social History Main Topics  . Smoking status: Never Smoker  . Smokeless tobacco: Never Used  . Alcohol use No  . Drug use: No  . Sexual activity: Yes    Partners: Female   Other Topics Concern  . Not on file   Social History Narrative  . No narrative on file    Family History  Problem Relation Age of Onset  . Heart disease Mother        CHF  . Hypertension Mother   . Diabetes Mother   . Stroke Mother   . Hypertension Father   . Heart disease Father        CHF  . Hypertension Sister   . Hypertension Sister   . Depression Neg Hx   .  Alcohol abuse Neg Hx   . Drug abuse Neg Hx     Allergies  Allergen Reactions  . Pneumococcal Vaccine     REACTION: significant arm swelling, redness    Medication list reviewed and updated in full in [place].  GEN: No fevers, chills. Nontoxic. Primarily MSK c/o today. MSK: Detailed in the HPI GI: tolerating PO intake without difficulty Neuro: No numbness, parasthesias, or tingling associated. Otherwise the pertinent positives of the ROS are noted above.   Objective:   BP 126/80   Pulse (!) 59   Temp 98.3 F (36.8 C) (Oral)   Ht 5\' 8"  (1.727 m)   Wt 188 lb 4 oz (85.4 kg)   BMI 28.62 kg/m    GEN: WDWN, NAD, Non-toxic, Alert & Oriented x 3 HEENT: Atraumatic, Normocephalic.  Ears and Nose: No external deformity. EXTR: No  clubbing/cyanosis/edema NEURO: Normal gait.  PSYCH: Normally interactive. Conversant. Not depressed or anxious appearing.  Calm demeanor.   Knee:  L Gait: Normal heel toe pattern ROM: 0-120 Effusion: neg Echymosis or edema: none Patellar tendon NT Painful PLICA: neg Patellar grind: negative Medial and lateral patellar facet loading: negative medial and lateral joint lines: mild medial joint line pain Mcmurray's neg Flexion-pinch neg Varus and valgus stress: stable Lachman: neg Ant and Post drawer: neg Hip abduction, IR, ER: WNL Hip flexion str: 5/5 Hip abd: 5/5 Quad: 5/5 VMO atrophy:No Hamstring concentric and eccentric: 5/5   Radiology: No results found.  Assessment and Plan:   Internal derangement of left knee  Primary osteoarthritis of left knee  Medial joint line pain in the setting of moving mulch, more likely degenerative meniscal pathology.  He is Arty doing well.  Continue with p.r.n. Tylenol or Aleve and icing if needed.  The patient was placed in a patellar J hinged brace for support when he is being more active.  If he is not improving after month or so I asked him to come back and see me.  Follow-up: No Follow-up on file.  Future Appointments Date Time Provider [place]  06/10/2017 12:00 PM [facility] None  06/13/2017 3:00 PM Ladell Pier, MD [facility] None  04/08/2018 11:00 AM [facility] 4 [facility] None  04/08/2018 11:30 AM Ladell Pier, MD [facility] None    Signed,  Maud Deed. Hiral Lukasiewicz, MD   Allergies as of 05/29/2017      Reactions   Pneumococcal Vaccine    REACTION: significant arm swelling, redness      Medication List       Accurate as of 05/29/17 11:59 PM. Always use your most recent med list.          amLODipine 5 MG tablet Commonly known as:  NORVASC Take 1 tablet (5 mg total) by mouth 2 (two) times daily.   aspirin 325 MG tablet Take 325 mg by mouth daily.   CENTRUM SILVER ULTRA  MENS Tabs Take 1 tablet by mouth daily.   fluticasone 50 MCG/ACT nasal spray Commonly known as:  FLONASE Place into both nostrils daily as needed.   hydrochlorothiazide 25 MG tablet Commonly known as:  HYDRODIURIL Take 1 tablet (25 mg total) by mouth daily.   sildenafil 20 MG tablet Commonly known as:  REVATIO Generic Revatio / Sildanefil 20 mg. 2 - 5 tabs 30 mins prior to intercourse.   simvastatin 40 MG tablet Commonly known as:  ZOCOR TAKE ONE TABLET BY MOUTH ONCE DAILY EVERY NIGHT

## 2017-06-10 ENCOUNTER — Other Ambulatory Visit: Payer: Self-pay

## 2017-06-13 ENCOUNTER — Other Ambulatory Visit: Payer: Self-pay

## 2017-06-13 ENCOUNTER — Ambulatory Visit: Payer: Self-pay | Admitting: Oncology

## 2017-08-15 ENCOUNTER — Encounter: Payer: Self-pay | Admitting: Family Medicine

## 2017-08-15 ENCOUNTER — Ambulatory Visit (INDEPENDENT_AMBULATORY_CARE_PROVIDER_SITE_OTHER): Payer: Medicare Other | Admitting: Family Medicine

## 2017-08-15 VITALS — BP 130/62 | HR 52 | Temp 98.5°F | Ht 68.0 in | Wt 185.8 lb

## 2017-08-15 DIAGNOSIS — M19012 Primary osteoarthritis, left shoulder: Secondary | ICD-10-CM | POA: Diagnosis not present

## 2017-08-15 MED ORDER — METHYLPREDNISOLONE ACETATE 40 MG/ML IJ SUSP
80.0000 mg | Freq: Once | INTRAMUSCULAR | Status: AC
  Administered 2017-08-15: 80 mg via INTRA_ARTICULAR

## 2017-08-15 NOTE — Progress Notes (Signed)
Dr. Frederico Hamman T. Zygmunt Mcglinn, MD, [facility] Primary [facility] and Sports Medicine [place], [address] Phone: [phone] Fax: [phone]  08/15/2017  Patient: Eddie Fox, MRN: [id number], DOB: [birth year], 76 y.o.  Primary Physician:  Owens Loffler, MD   Chief Complaint  Patient presents with  . Shoulder Pain    Left   Subjective:   Eddie Fox is a 76 y.o. very pleasant male patient who presents with the following:  Has been hurting for about a year. Dr. Durward Fortes did 3 corticosteroid shots at that time.  [place] out of a treestand many years ago. He has some grinding with movement and some pain in terminal motion. He also has pain in a T-shirt distribution. He is not having any impingement type symptoms. No numbness or tingling.  [place] OA  intraart l inj shoul  Past Medical History, Surgical History, Social History, Family History, Problem List, Medications, and Allergies have been reviewed and updated if relevant.  Patient Active Problem List   Diagnosis Date Noted  . 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 CHOLECYSTECTOMY, LAPAROSCOPIC, HX OF 12/28/2010  . VITAMIN D DEFICIENCY 01/30/2010  . FATTY LIVER DISEASE 08/12/2009  . ESSENTIAL HYPERTENSION 09/14/2008  . COLONIC POLYPS 07/08/2007  . HYPERLIPIDEMIA 07/08/2007  . ERECTILE DYSFUNCTION 07/08/2007  . 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 Benign neoplasm of colon   . Calculus of kidney   . ED (erectile dysfunction)   . IBS (irritable bowel syndrome)   . Melanoma ([place])    history of  . Other and unspecified hyperlipidemia   . Other chronic nonalcoholic liver disease   . Unspecified essential hypertension   . Unspecified vitamin D deficiency     Past Surgical History:  Procedure Laterality Date  . AMPUTATION     distal phalanx of right thumb- traumatic  . CHOLECYSTECTOMY  10/03/09   lap    . LAMINECTOMY  1995   L 3-T11 Harrington rods in back  . LAPAROSCOPIC APPENDECTOMY N/A 05/16/2016   Procedure: APPENDECTOMY LAPAROSCOPIC;  Surgeon: Greer Pickerel, MD;  Location: [facility];  Service: General;  Laterality: N/A;  . [place]   right upper abd, Dr. Ronnald Ramp  . ORIF ANKLE FRACTURE  1967    Social History   Social History  . Marital status: Married    Spouse name: N/A  . Number of children: 2  . Years of education: N/A   Occupational History  . retired     retired from [employer] 04/2002   Social History Main Topics  . Smoking status: Never Smoker  . Smokeless tobacco: Never Used  . Alcohol use No  . Drug use: No  . Sexual activity: Yes    Partners: Female   Other Topics Concern  . Not on file   Social History Narrative  . No narrative on file    Family History  Problem Relation Age of Onset  . Heart disease Mother        CHF  . Hypertension Mother   . Diabetes Mother   . Stroke Mother   . Hypertension Father   . Heart disease Father        CHF  . Hypertension Sister   . Hypertension Sister   . Depression Neg Hx   . Alcohol abuse Neg Hx   . Drug abuse Neg Hx  Allergies  Allergen Reactions  . Pneumococcal Vaccine     REACTION: significant arm swelling, redness    Medication list reviewed and updated in full in [place].  GEN: No fevers, chills. Nontoxic. Primarily MSK c/o today. MSK: Detailed in the HPI GI: tolerating PO intake without difficulty Neuro: No numbness, parasthesias, or tingling associated. Otherwise the pertinent positives of the ROS are noted above.   Objective:   BP 130/62   Pulse (!) 52   Temp 98.5 F (36.9 C) (Oral)   Ht 5\' 8"  (1.727 m)   Wt 185 lb 12 oz (84.3 kg)   BMI 28.24 kg/m    GEN: WDWN, NAD, Non-toxic, Alert & Oriented x 3 HEENT: Atraumatic, Normocephalic.  Ears and Nose: No external deformity. EXTR: No clubbing/cyanosis/edema NEURO: Normal gait.  PSYCH: Normally interactive.  Conversant. Not depressed or anxious appearing.  Calm demeanor.    Left shoulder is nontender along the clavicle and at the before meals joint as well as the bicipital groove.  Abduction is limited to about 170. Flexion is limited to 155. Modest restriction and external rotation, internal rotation is normal.  There is crepitus with motion and all strength is 5/5.  Hawkins and Neer testing is negative.  Radiology: No results found.  Assessment and Plan:   Glenohumeral arthritis, left - Plan: methylPREDNISolone acetate (DEPO-MEDROL) injection 80 mg  Good response to intraarticular injection in the past.   Intrarticular Shoulder Injection, L Verbal consent was obtained from the patient. Risks including infection explained and contrasted with benefits and alternatives. Patient prepped with Chloraprep and Ethyl Chloride used for anesthesia. An intraarticular shoulder injection was performed using the posterior approach. The patient tolerated the procedure well and had decreased pain post injection. No complications. Injection: 8 cc of Lidocaine 1% and 2 mL Depo-Medrol 40 mg. Needle: 22 gauge   Follow-up: No Follow-up on file.  Future Appointments Date Time Provider [place]  04/08/2018 11:00 AM [facility] None  04/08/2018 11:30 AM Ladell Pier, MD [facility] None    Meds ordered this encounter  Medications  . methylPREDNISolone acetate (DEPO-MEDROL) injection 80 mg   Signed,  Cinthya Bors T. Jerauld Bostwick, MD   Patient's Medications  New Prescriptions   No medications on file  Previous Medications   AMLODIPINE (NORVASC) 5 MG TABLET    Take 1 tablet (5 mg total) by mouth 2 (two) times daily.   ASPIRIN 325 MG TABLET    Take 325 mg by mouth daily.   FLUTICASONE (FLONASE) 50 MCG/ACT NASAL SPRAY    Place into both nostrils daily as needed.   HYDROCHLOROTHIAZIDE (HYDRODIURIL) 25 MG TABLET    Take 1 tablet (25 mg total) by mouth daily.   MULTIPLE  VITAMINS-MINERALS (CENTRUM SILVER ULTRA MENS) TABS    Take 1 tablet by mouth daily.   SILDENAFIL (REVATIO) 20 MG TABLET    Generic Revatio / Sildanefil 20 mg. 2 - 5 tabs 30 mins prior to intercourse.   SIMVASTATIN (ZOCOR) 40 MG TABLET    TAKE ONE TABLET BY MOUTH ONCE DAILY EVERY NIGHT  Modified Medications   No medications on file  Discontinued Medications   No medications on file

## 2017-08-20 ENCOUNTER — Telehealth: Payer: Self-pay

## 2017-08-20 NOTE — Telephone Encounter (Signed)
Pt said Dr Carloyn Manner did surgery on back years ago and pt was given acetami/codeine 300- 70 mg;Dr Carloyn Manner has moved. pt request refill [facility]. Pt last got 11/14/16 from dentist Dr Rip Harbour. Pt has taken hydrocodone before but that made pt sick. Pt request cb. Pt seen 08/15/17 for shoulder pain. I do not see this med on med list.Please advise.

## 2017-08-21 MED ORDER — ACETAMINOPHEN-CODEINE #3 300-30 MG PO TABS: 1.0000 | ORAL_TABLET | Freq: Four times a day (QID) | ORAL | 0 refills | Status: DC | PRN

## 2017-08-21 NOTE — Telephone Encounter (Signed)
Please change to #20 for this patient instead.

## 2017-08-21 NOTE — Telephone Encounter (Signed)
Tylenol #3 called into [facility] as instructed by Dr. Lorelei Pont.  Mr. Harkins notified by telephone.

## 2017-08-21 NOTE — Telephone Encounter (Signed)
[place] request cb; if acute pain can only give pt # 20 but if chronic pain can give pt # 40 due to new guidelines. Please advise.

## 2017-08-21 NOTE — Telephone Encounter (Signed)
It is ok to refill Tylenol #3, 1 po q 6 hours prn pain, #40, 0 ref

## 2017-08-21 NOTE — Addendum Note (Signed)
Addended by: Carter Kitten on: 08/21/2017 04:55 PM   Modules accepted: Orders

## 2017-08-21 NOTE — Telephone Encounter (Signed)
Pharmacist at [facility] notified to change quantity to #20 per Dr. Lorelei Pont.

## 2017-08-28 ENCOUNTER — Ambulatory Visit (INDEPENDENT_AMBULATORY_CARE_PROVIDER_SITE_OTHER): Payer: Medicare Other

## 2017-08-28 DIAGNOSIS — Z23 Encounter for immunization: Secondary | ICD-10-CM | POA: Diagnosis not present

## 2017-10-29 ENCOUNTER — Other Ambulatory Visit: Payer: Self-pay | Admitting: Family Medicine

## 2017-10-29 DIAGNOSIS — I10 Essential (primary) hypertension: Secondary | ICD-10-CM

## 2017-11-18 ENCOUNTER — Ambulatory Visit: Payer: Medicare Other

## 2017-11-18 ENCOUNTER — Other Ambulatory Visit: Payer: Self-pay | Admitting: Family Medicine

## 2017-11-18 DIAGNOSIS — Z125 Encounter for screening for malignant neoplasm of prostate: Secondary | ICD-10-CM

## 2017-11-18 DIAGNOSIS — Z79899 Other long term (current) drug therapy: Secondary | ICD-10-CM

## 2017-11-18 DIAGNOSIS — E785 Hyperlipidemia, unspecified: Secondary | ICD-10-CM

## 2017-11-19 ENCOUNTER — Ambulatory Visit: Payer: Medicare Other

## 2017-11-19 ENCOUNTER — Other Ambulatory Visit (INDEPENDENT_AMBULATORY_CARE_PROVIDER_SITE_OTHER): Payer: Medicare Other

## 2017-11-19 DIAGNOSIS — Z125 Encounter for screening for malignant neoplasm of prostate: Secondary | ICD-10-CM

## 2017-11-19 DIAGNOSIS — E785 Hyperlipidemia, unspecified: Secondary | ICD-10-CM

## 2017-11-19 DIAGNOSIS — Z79899 Other long term (current) drug therapy: Secondary | ICD-10-CM

## 2017-11-19 LAB — BASIC METABOLIC PANEL
BUN: 18 mg/dL (ref 6–23)
CO2: 32 mEq/L (ref 19–32)
Calcium: 9.3 mg/dL (ref 8.4–10.5)
Chloride: 104 mEq/L (ref 96–112)
Creatinine, Ser: 1.2 mg/dL (ref 0.40–1.50)
GFR: 62.64 mL/min (ref >60.00)
Glucose, Bld: 90 mg/dL (ref 70–99)
Potassium: 3.5 mEq/L (ref 3.5–5.1)
Sodium: 142 mEq/L (ref 135–145)

## 2017-11-19 LAB — CBC WITH DIFFERENTIAL/PLATELET
Basophils Absolute: 0.1 10*3/uL (ref 0.0–0.1)
Basophils Relative: 1.2 % (ref 0.0–3.0)
Eosinophils Absolute: 0.3 10*3/uL (ref 0.0–0.7)
Eosinophils Relative: 4.8 % (ref 0.0–5.0)
HCT: 45.4 % (ref 39.0–52.0)
Hemoglobin: 15.4 g/dL (ref 13.0–17.0)
Lymphocytes Relative: 23.2 % (ref 12.0–46.0)
Lymphs Abs: 1.4 10*3/uL (ref 0.7–4.0)
MCHC: 33.8 g/dL (ref 30.0–36.0)
MCV: 94.6 fl (ref 78.0–100.0)
Monocytes Absolute: 0.7 10*3/uL (ref 0.1–1.0)
Monocytes Relative: 11.7 % (ref 3.0–12.0)
Neutro Abs: 3.6 10*3/uL (ref 1.4–7.7)
Neutrophils Relative %: 59.1 % (ref 43.0–77.0)
Platelets: 190 10*3/uL (ref 150.0–400.0)
RBC: 4.8 Mil/uL (ref 4.22–5.81)
RDW: 13.1 % (ref 11.5–15.5)
WBC: 6 10*3/uL (ref 4.0–10.5)

## 2017-11-19 LAB — HEPATIC FUNCTION PANEL
ALT: 16 U/L (ref 0–53)
AST: 16 U/L (ref 0–37)
Albumin: 4.7 g/dL (ref 3.5–5.2)
Alkaline Phosphatase: 44 U/L (ref 39–117)
Bilirubin, Direct: 0.1 mg/dL (ref 0.0–0.3)
Total Bilirubin: 0.7 mg/dL (ref 0.2–1.2)
Total Protein: 7 g/dL (ref 6.0–8.3)

## 2017-11-19 LAB — LIPID PANEL
Cholesterol: 159 mg/dL (ref 0–200)
HDL: 35.8 mg/dL — ABNORMAL LOW (ref >39.00)
LDL Cholesterol: 98 mg/dL (ref 0–99)
NonHDL: 123.41
Total CHOL/HDL Ratio: 4
Triglycerides: 129 mg/dL (ref 0.0–149.0)
VLDL: 25.8 mg/dL (ref 0.0–40.0)

## 2017-11-19 LAB — PSA, MEDICARE: PSA: 1.79 ng/ml (ref 0.10–4.00)

## 2017-11-20 ENCOUNTER — Ambulatory Visit (INDEPENDENT_AMBULATORY_CARE_PROVIDER_SITE_OTHER): Payer: Medicare Other | Admitting: Family Medicine

## 2017-11-20 ENCOUNTER — Encounter: Payer: Self-pay | Admitting: Family Medicine

## 2017-11-20 ENCOUNTER — Other Ambulatory Visit: Payer: Self-pay

## 2017-11-20 VITALS — BP 120/64 | HR 61 | Temp 97.4°F | Ht 68.5 in | Wt 181.5 lb

## 2017-11-20 DIAGNOSIS — Z0000 Encounter for general adult medical examination without abnormal findings: Secondary | ICD-10-CM | POA: Diagnosis not present

## 2017-11-20 DIAGNOSIS — Z1211 Encounter for screening for malignant neoplasm of colon: Secondary | ICD-10-CM | POA: Diagnosis not present

## 2017-11-20 NOTE — Progress Notes (Signed)
Dr. Frederico Hamman T. Yohance Hathorne, MD, [facility] [place], [address] Phone: [phone] Fax: [phone]  11/20/2017  Patient: Eddie Fox, MRN: [id number], DOB: 07-22-1941, 76 y.o.  Primary Physician:  Owens Loffler, MD   Chief Complaint  Patient presents with  . Medicare Wellness   Subjective:   Eddie Fox is a 76 y.o. pleasant patient who presents for a medicare wellness examination:  Preventative Health Maintenance Visit:  Health Maintenance Summary Reviewed and updated, unless pt declines services.  Tobacco History Reviewed. Alcohol: No concerns, no excessive use Exercise Habits: Some activity, rec at least 30 mins 5 times a week STD concerns: no risk or activity to increase risk Drug Use: None Encouraged self-testicular check  Cough, runny nose and nasal spraay.   Health Maintenance  Topic Date Due  . PNA vac Low Risk Adult (2 of 2 - PPSV23) 03/16/2018 (Originally 02/16/2016)  . COLONOSCOPY  03/05/2019  . TETANUS/TDAP  12/23/2019  . INFLUENZA VACCINE  Completed    Immunization History  Administered Date(s) Administered  . Influenza Split 12/25/2011  . Influenza,inj,Quad PF,6+ Mos 08/19/2013, 08/26/2014, 09/14/2015, 09/11/2016, 08/28/2017  . Pneumococcal Conjugate-13 02/16/2015  . Td 03/24/2002, 12/22/2009    Patient Active Problem List   Diagnosis Date Noted  . 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 CHOLECYSTECTOMY, LAPAROSCOPIC, HX OF 12/28/2010  . VITAMIN D DEFICIENCY 01/30/2010  . FATTY LIVER DISEASE 08/12/2009  . ESSENTIAL HYPERTENSION 09/14/2008  . COLONIC POLYPS 07/08/2007  . HYPERLIPIDEMIA 07/08/2007  . ERECTILE DYSFUNCTION 07/08/2007  . 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 Benign neoplasm of colon   . Calculus of kidney   . ED (erectile dysfunction)   . IBS (irritable bowel syndrome)    . Melanoma ([place])    history of  . Other and unspecified hyperlipidemia   . Other chronic nonalcoholic liver disease   . Unspecified essential hypertension   . Unspecified vitamin D deficiency    Past Surgical History:  Procedure Laterality Date  . AMPUTATION     distal phalanx of right thumb- traumatic  . CHOLECYSTECTOMY  10/03/09   lap  . LAMINECTOMY  1995   L 3-T11 Harrington rods in back  . LAPAROSCOPIC APPENDECTOMY N/A 05/16/2016   Procedure: APPENDECTOMY LAPAROSCOPIC;  Surgeon: Greer Pickerel, MD;  Location: [facility];  Service: General;  Laterality: N/A;  . [place]   right upper abd, Dr. Ronnald Ramp  . ORIF ANKLE FRACTURE  1967   Social History   Socioeconomic History  . Marital status: Married    Spouse name: Not on file  . Number of children: 2  . Years of education: Not on file  . Highest education level: Not on file  Social Needs  . Financial resource strain: Not on file  . Food insecurity - worry: Not on file  . Food insecurity - inability: Not on file  . Transportation needs - medical: Not on file  . Transportation needs - non-medical: Not on file  Occupational History  . Occupation: retired    Comment: retired from [employer] 04/2002  Tobacco Use  . Smoking status: Never Smoker  . Smokeless tobacco: Never Used  Substance and Sexual Activity  . Alcohol use: No  . Drug use: No  . Sexual activity: Yes    Partners: Female  Other Topics Concern  . Not on file  Social History Narrative  . Not on file   Family History  Problem Relation Age of Onset  . Heart disease Mother        CHF  . Hypertension Mother   . Diabetes Mother   . Stroke Mother   . Hypertension Father   . Heart disease Father        CHF  . Hypertension Sister   . Hypertension Sister   . Depression Neg Hx   . Alcohol abuse Neg Hx   . Drug abuse Neg Hx    Allergies  Allergen Reactions  . Hydrocodone     pts states made him sick.  . Pneumococcal Vaccine     REACTION:  significant arm swelling, redness    Medication list has been reviewed and updated.   General: Denies fever, chills, sweats. No significant weight loss. Eyes: Denies blurring,significant itching ENT: Denies earache, sore throat, and hoarseness. Cardiovascular: Denies chest pains, palpitations, dyspnea on exertion Respiratory: Denies cough, dyspnea at rest,wheeezing Breast: no concerns about lumps GI: Denies nausea, vomiting, diarrhea, constipation, change in bowel habits, abdominal pain, melena, hematochezia GU: Denies penile discharge, ED, urinary flow / outflow problems. No STD concerns. Musculoskeletal: Denies back pain, joint pain Derm: Denies rash, itching Neuro: Denies  paresthesias, frequent falls, frequent headaches Psych: Denies depression, anxiety Endocrine: Denies cold intolerance, heat intolerance, polydipsia Heme: Denies enlarged lymph nodes Allergy: No hayfever  Objective:   BP 120/64   Pulse 61   Temp (!) 97.4 F (36.3 C) (Oral)   Ht 5' 8.5" (1.74 m)   Wt 181 lb 8 oz (82.3 kg)   SpO2 96%   BMI 27.20 kg/m   The patient completed a fall screen and PHQ-2 and PHQ-9 if necessary, which is documented in the EHR. The CMA/LPN/RN who assisted the patient verbally completed with them and documented results in [facility] link EHR.   Hearing Screening   Method: Audiometry   125Hz 250Hz 500Hz 1000Hz 2000Hz 3000Hz 4000Hz 6000Hz 8000Hz  Right ear:   0 0 0  0    Left ear:   0 0 0  0      Visual Acuity Screening   Right eye Left eye Both eyes  Without correction: 20/20 20/20 20/20  With correction:       GEN: well developed, well nourished, no acute distress Eyes: conjunctiva and lids normal, PERRLA, EOMI ENT: TM clear, nares clear, oral exam WNL Neck: supple, no lymphadenopathy, no thyromegaly, no JVD Pulm: clear to auscultation and percussion, respiratory effort normal CV: regular rate and rhythm, S1-S2, no murmur, rub or gallop, no bruits, peripheral pulses  normal and symmetric, no cyanosis, clubbing, edema or varicosities GI: soft, non-tender; no hepatosplenomegaly, masses; active bowel sounds all quadrants GU: no hernia, testicular mass, penile discharge Lymph: no cervical, axillary or inguinal adenopathy MSK: gait normal, muscle tone and strength WNL, no joint swelling, effusions, discoloration, crepitus  SKIN: clear, good turgor, color WNL, no rashes, lesions, or ulcerations Neuro: normal mental status, normal strength, sensation, and motion Psych: alert; oriented to person, place and time, normally interactive and not anxious or depressed in appearance.  All labs reviewed with patient.  Lipids:    Component Value Date/Time   CHOL 159 11/19/2017 1129   TRIG 129.0 11/19/2017 1129   HDL 35.80 (L) 11/19/2017 1129   LDLDIRECT 171.9 07/08/2007 1044   VLDL 25.8 11/19/2017 1129   CHOLHDL 4 11/19/2017 1129   CBC: CBC Latest Ref Rng & Units 11/19/2017 10/24/2016 05/24/2016    WBC 4.0 - 10.5 K/uL 6.0 7.4 8.8  Hemoglobin 13.0 - 17.0 g/dL 15.4 15.4 15.2  Hematocrit 39.0 - 52.0 % 45.4 45.4 44.8  Platelets 150.0 - 400.0 K/uL 190.0 212.0 265.0    Basic Metabolic Panel:    Component Value Date/Time   NA 142 11/19/2017 1129   NA 145 04/08/2017 1015   K 3.5 11/19/2017 1129   K 3.2 (L) 04/08/2017 1015   CL 104 11/19/2017 1129   CO2 32 11/19/2017 1129   CO2 29 04/08/2017 1015   BUN 18 11/19/2017 1129   BUN 17.8 04/08/2017 1015   CREATININE 1.20 11/19/2017 1129   CREATININE 1.1 04/08/2017 1015   GLUCOSE 90 11/19/2017 1129   GLUCOSE 87 04/08/2017 1015   CALCIUM 9.3 11/19/2017 1129   CALCIUM 9.3 04/08/2017 1015   Hepatic Function Latest Ref Rng & Units 11/19/2017 04/08/2017 10/24/2016  Total Protein 6.0 - 8.3 g/dL 7.0 6.9 7.2  Albumin 3.5 - 5.2 g/dL 4.7 4.1 4.4  AST 0 - 37 U/L 16 17 16  ALT 0 - 53 U/L 16 22 20  Alk Phosphatase 39 - 117 U/L 44 43 35(L)  Total Bilirubin 0.2 - 1.2 mg/dL 0.7 0.55 0.6  Bilirubin, Direct 0.0 - 0.3 mg/dL 0.1 - 0.1      Lab Results  Component Value Date   TSH 3.27 05/12/2014   Lab Results  Component Value Date   PSA 1.79 11/19/2017   PSA 1.66 05/24/2016   PSA 1.17 06/07/2015    Assessment and Plan:   Healthcare maintenance  Screen for colon cancer - Plan: Ambulatory referral to Gastroenterology He is due for repeat colonoscopy, so I will put in the orders for this and he is going to set this up  Health Maintenance Exam: The patient's preventative maintenance and recommended screening tests for an annual wellness exam were reviewed in full today. Brought up to date unless services declined.  Counselled on the importance of diet, exercise, and its role in overall health and mortality. The patient's FH and SH was reviewed, including their home life, tobacco status, and drug and alcohol status.  Follow-up in 1 year for physical exam or additional follow-up below.  I have personally reviewed the Medicare Annual Wellness questionnaire and have noted 1. The patient's medical and social history 2. Their use of alcohol, tobacco or illicit drugs 3. Their current medications and supplements 4. The patient's functional ability including ADL's, fall risks, home safety risks and hearing or visual             impairment. 5. Diet and physical activities 6. Evidence for depression or mood disorders 7. Reviewed Updated provider list, see scanned forms and [facility] Snapshot.   The patients weight, height, BMI and visual acuity have been recorded in the chart I have made referrals, counseling and provided education to the patient based review of the above and I have provided the pt with a written personalized care plan for preventive services.  I have provided the patient with a copy of your personalized plan for preventive services. Instructed to take the time to review along with their updated medication list.  Follow-up: No Follow-up on file. Or follow-up in 1 year if not noted.  Future Appointments   Date Time Provider Department Center  04/08/2018 11:00 AM [facility] 4 [facility] None  04/08/2018 11:30 AM Sherrill, Gary B, MD [facility] None   Orders Placed This Encounter  Procedures  . Ambulatory referral to Gastroenterology    Signed,    Zyliah Schier T. Zai Chmiel, MD   Allergies as of 11/20/2017      Reactions   Hydrocodone    pts states made him sick.   Pneumococcal Vaccine    REACTION: significant arm swelling, redness      Medication List        Accurate as of 11/20/17 11:59 PM. Always use your most recent med list.          acetaminophen-codeine 300-30 MG tablet Commonly known as:  TYLENOL #3 Take 1 tablet by mouth every 6 (six) hours as needed for moderate pain.   amLODipine 5 MG tablet Commonly known as:  NORVASC Take 1 tablet (5 mg total) by mouth 2 (two) times daily.   aspirin 325 MG tablet Take 325 mg by mouth daily.   CENTRUM SILVER ULTRA MENS Tabs Take 1 tablet by mouth daily.   fluticasone 50 MCG/ACT nasal spray Commonly known as:  FLONASE Place into both nostrils daily as needed.   hydrochlorothiazide 25 MG tablet Commonly known as:  HYDRODIURIL TAKE 1 TABLET BY MOUTH ONCE DAILY   sildenafil 20 MG tablet Commonly known as:  REVATIO Generic Revatio / Sildanefil 20 mg. 2 - 5 tabs 30 mins prior to intercourse.   simvastatin 40 MG tablet Commonly known as:  ZOCOR TAKE ONE TABLET BY MOUTH ONCE DAILY EVERY NIGHT

## 2017-11-20 NOTE — Patient Instructions (Signed)

## 2017-11-27 ENCOUNTER — Ambulatory Visit (INDEPENDENT_AMBULATORY_CARE_PROVIDER_SITE_OTHER): Payer: Medicare Other | Admitting: Internal Medicine

## 2017-11-27 ENCOUNTER — Telehealth: Payer: Self-pay

## 2017-11-27 ENCOUNTER — Encounter: Payer: Self-pay | Admitting: Family Medicine

## 2017-11-27 ENCOUNTER — Encounter: Payer: Self-pay | Admitting: Internal Medicine

## 2017-11-27 VITALS — BP 134/76 | HR 89 | Temp 98.5°F | Resp 18 | Wt 181.5 lb

## 2017-11-27 DIAGNOSIS — C181 Malignant neoplasm of appendix: Secondary | ICD-10-CM

## 2017-11-27 DIAGNOSIS — J0110 Acute frontal sinusitis, unspecified: Secondary | ICD-10-CM

## 2017-11-27 MED ORDER — FLUTICASONE PROPIONATE 50 MCG/ACT NA SUSP: 2.0000 | Freq: Every day | NASAL | 5 refills | Status: DC | PRN

## 2017-11-27 MED ORDER — AMOXICILLIN 500 MG PO TABS: 1000.0000 mg | ORAL_TABLET | Freq: Two times a day (BID) | ORAL | 0 refills | Status: AC

## 2017-11-27 NOTE — Telephone Encounter (Signed)
Pt walked in with prod cough with grey - green phlegm, fever, wheezing but no problem breathing; nasal spray and nyquil not helping. Pt has appt with Dr Silvio Pate 11/27/17 at 3 pm.

## 2017-11-27 NOTE — Progress Notes (Signed)
Subjective:    Patient ID: Eddie Fox, male    DOB: 10/25/1941, 76 y.o.   MRN: [id number]  HPI Here due to cough Sick since his visit here a week ago  Cough--gray green sputum No sneezing Lots of post nasal drip and nasal congestion Head pressure--frontal No ear pain  Some fever Awoke in sweat 2 nights ago No SOB  Using nyquil ---some help sleeping but symptoms persist Tried benedryl at night--dried him up but no help  Current Outpatient Medications on File Prior to Visit  Medication Sig Dispense Refill  . acetaminophen-codeine (TYLENOL #3) 300-30 MG tablet Take 1 tablet by mouth every 6 (six) hours as needed for moderate pain. 20 tablet 0  . amLODipine (NORVASC) 5 MG tablet Take 1 tablet (5 mg total) by mouth 2 (two) times daily. 180 tablet 3  . aspirin 325 MG tablet Take 325 mg by mouth daily.    . fluticasone (FLONASE) 50 MCG/ACT nasal spray Place into both nostrils daily as needed.  5  . hydrochlorothiazide (HYDRODIURIL) 25 MG tablet TAKE 1 TABLET BY MOUTH ONCE DAILY 90 tablet 0  . Multiple Vitamins-Minerals (CENTRUM SILVER ULTRA MENS) TABS Take 1 tablet by mouth daily.    . sildenafil (REVATIO) 20 MG tablet Generic Revatio / Sildanefil 20 mg. 2 - 5 tabs 30 mins prior to intercourse. 6 tablet 11  . simvastatin (ZOCOR) 40 MG tablet TAKE ONE TABLET BY MOUTH ONCE DAILY EVERY NIGHT 90 tablet 3   No current facility-administered medications on file prior to visit.     Allergies  Allergen Reactions  . Hydrocodone     pts states made him sick.  . Pneumococcal Vaccine     REACTION: significant arm swelling, redness    Past Medical History:  Diagnosis Date  . Benign neoplasm of colon   . Calculus of kidney   . ED (erectile dysfunction)   . IBS (irritable bowel syndrome)   . Melanoma ([place])    history of  . Other and unspecified hyperlipidemia   . Other chronic nonalcoholic liver disease   . Unspecified essential hypertension   . Unspecified vitamin D deficiency      Past Surgical History:  Procedure Laterality Date  . AMPUTATION     distal phalanx of right thumb- traumatic  . CHOLECYSTECTOMY  10/03/09   lap  . LAMINECTOMY  1995   L 3-T11 Harrington rods in back  . LAPAROSCOPIC APPENDECTOMY N/A 05/16/2016   Procedure: APPENDECTOMY LAPAROSCOPIC;  Surgeon: Greer Pickerel, MD;  Location: [facility];  Service: General;  Laterality: N/A;  . [place]   right upper abd, Dr. Ronnald Ramp  . ORIF ANKLE FRACTURE  1967    Family History  Problem Relation Age of Onset  . Heart disease Mother        CHF  . Hypertension Mother   . Diabetes Mother   . Stroke Mother   . Hypertension Father   . Heart disease Father        CHF  . Hypertension Sister   . Hypertension Sister   . Depression Neg Hx   . Alcohol abuse Neg Hx   . Drug abuse Neg Hx     Social History   Socioeconomic History  . Marital status: Married    Spouse name: Not on file  . Number of children: 2  . Years of education: Not on file  . Highest education level: Not on file  Social Needs  . Financial resource strain:  Not on file  . Food insecurity - worry: Not on file  . Food insecurity - inability: Not on file  . Transportation needs - medical: Not on file  . Transportation needs - non-medical: Not on file  Occupational History  . Occupation: retired    Comment: retired from [employer] 04/2002  Tobacco Use  . Smoking status: Never Smoker  . Smokeless tobacco: Never Used  Substance and Sexual Activity  . Alcohol use: No  . Drug use: No  . Sexual activity: Yes    Partners: Female  Other Topics Concern  . Not on file  Social History Narrative  . Not on file   Review of Systems  No rash No vomiting or diarrhea Appetite is okay    Objective:   Physical Exam  Constitutional: He appears well-nourished. No distress.  HENT:  Mouth/Throat: Oropharynx is clear and moist. No oropharyngeal exudate.  Mild frontal tenderness Moderate nasal inflammation--considerable  green mucus bilaterally TMs normal  Neck: No thyromegaly present.  Pulmonary/Chest: Effort normal and breath sounds normal. No respiratory distress. He has no wheezes. He has no rales.  Lymphadenopathy:    He has no cervical adenopathy.          Assessment & Plan:

## 2017-11-27 NOTE — Telephone Encounter (Signed)
Will see him then 

## 2017-11-27 NOTE — Assessment & Plan Note (Signed)
Seems to have bacterial infection Will treat with amoxil Discussed symptom relief

## 2017-12-06 ENCOUNTER — Telehealth: Payer: Self-pay | Admitting: Internal Medicine

## 2017-12-06 MED ORDER — BENZONATATE 200 MG PO CAPS: 200.0000 mg | ORAL_CAPSULE | Freq: Three times a day (TID) | ORAL | 0 refills | Status: DC | PRN

## 2017-12-06 MED ORDER — AZITHROMYCIN 250 MG PO TABS: ORAL_TABLET | ORAL | 0 refills | Status: DC

## 2017-12-06 NOTE — Telephone Encounter (Signed)
Pt notified Rxs sent to pharmacy and advise of Dr. Marliss Coots comments

## 2017-12-06 NOTE — Telephone Encounter (Signed)
He was treated for a sinus infection with amoxil at last visit with PCP  What is he taking otc for symptoms now ?

## 2017-12-06 NOTE — Telephone Encounter (Signed)
Pt was seen 11/27/17 and Dr Silvio Pate is out of office with no computer access.Please advise.

## 2017-12-06 NOTE — Telephone Encounter (Signed)
Sent in zpack (broad cov abx) and tessalon for cough to his pharmacy  mucinex is ok otc to loosen congestion  F/u if worse or no improvement

## 2017-12-06 NOTE — Telephone Encounter (Signed)
Pt returned call per Dr Marliss Coots request; Pt states that he took amox until the 19th; now pt doing more coughing at night and thick, yellow mucus when he blows his nose; pt states that he coughs this up also; please call pt at  [phone] (temporary number) when the MD calls something in; will route to [place] pool

## 2017-12-06 NOTE — Telephone Encounter (Signed)
Left VM requesting pt to call back, noted on that CRM

## 2017-12-06 NOTE — Telephone Encounter (Signed)
Copied from [place] [id number]. Topic: Quick Communication - See Telephone Encounter >> Dec 06, 2017  9:05 AM Bea Graff, NT wrote: CRM for notification. See Telephone encounter for: Pt would like Dr. Silvio Pate to call something in for his cold. He is still coughing up phlegm. Production designer, theatre/television/film at [employer].  12/06/17.

## 2017-12-30 ENCOUNTER — Other Ambulatory Visit: Payer: Self-pay | Admitting: Family Medicine

## 2018-01-09 ENCOUNTER — Telehealth: Payer: Self-pay | Admitting: *Deleted

## 2018-01-09 NOTE — Telephone Encounter (Signed)
Mr. Swamy was here today with wife.  He ask that Dr. Lorelei Pont send order in for the Cologuard.  Ok to order?

## 2018-01-10 NOTE — Telephone Encounter (Signed)
Cologuard order faxed to [phone]. °

## 2018-01-10 NOTE — Telephone Encounter (Signed)
yes

## 2018-01-23 DIAGNOSIS — Z1212 Encounter for screening for malignant neoplasm of rectum: Secondary | ICD-10-CM | POA: Diagnosis not present

## 2018-01-23 DIAGNOSIS — Z1211 Encounter for screening for malignant neoplasm of colon: Secondary | ICD-10-CM | POA: Diagnosis not present

## 2018-01-23 LAB — COLOGUARD: Cologuard: NEGATIVE

## 2018-01-28 DIAGNOSIS — L821 Other seborrheic keratosis: Secondary | ICD-10-CM | POA: Diagnosis not present

## 2018-01-28 DIAGNOSIS — L570 Actinic keratosis: Secondary | ICD-10-CM | POA: Diagnosis not present

## 2018-01-28 DIAGNOSIS — D692 Other nonthrombocytopenic purpura: Secondary | ICD-10-CM | POA: Diagnosis not present

## 2018-01-28 DIAGNOSIS — Z85828 Personal history of other malignant neoplasm of skin: Secondary | ICD-10-CM | POA: Diagnosis not present

## 2018-01-28 DIAGNOSIS — Z85820 Personal history of malignant melanoma of skin: Secondary | ICD-10-CM | POA: Diagnosis not present

## 2018-02-03 ENCOUNTER — Encounter: Payer: Self-pay | Admitting: Family Medicine

## 2018-02-06 ENCOUNTER — Other Ambulatory Visit: Payer: Self-pay | Admitting: Family Medicine

## 2018-02-06 DIAGNOSIS — I10 Essential (primary) hypertension: Secondary | ICD-10-CM

## 2018-04-08 ENCOUNTER — Telehealth: Payer: Self-pay

## 2018-04-08 ENCOUNTER — Inpatient Hospital Stay: Payer: Medicare Other

## 2018-04-08 ENCOUNTER — Telehealth: Payer: Self-pay | Admitting: Oncology

## 2018-04-08 ENCOUNTER — Inpatient Hospital Stay: Payer: Medicare Other | Attending: Oncology | Admitting: Oncology

## 2018-04-08 VITALS — BP 134/61 | HR 58 | Temp 98.7°F | Resp 17 | Ht 68.5 in | Wt 182.1 lb

## 2018-04-08 DIAGNOSIS — E785 Hyperlipidemia, unspecified: Secondary | ICD-10-CM

## 2018-04-08 DIAGNOSIS — C7A020 Malignant carcinoid tumor of the appendix: Secondary | ICD-10-CM

## 2018-04-08 DIAGNOSIS — Z85820 Personal history of malignant melanoma of skin: Secondary | ICD-10-CM | POA: Diagnosis not present

## 2018-04-08 DIAGNOSIS — C181 Malignant neoplasm of appendix: Secondary | ICD-10-CM | POA: Diagnosis not present

## 2018-04-08 DIAGNOSIS — R600 Localized edema: Secondary | ICD-10-CM | POA: Diagnosis not present

## 2018-04-08 DIAGNOSIS — Z9049 Acquired absence of other specified parts of digestive tract: Secondary | ICD-10-CM

## 2018-04-08 DIAGNOSIS — I10 Essential (primary) hypertension: Secondary | ICD-10-CM | POA: Diagnosis not present

## 2018-04-08 LAB — BASIC METABOLIC PANEL
Anion gap: 11 (ref 3–11)
BUN: 22 mg/dL (ref 7–26)
CO2: 24 mmol/L (ref 22–29)
Calcium: 9.7 mg/dL (ref 8.4–10.4)
Chloride: 108 mmol/L (ref 98–109)
Creatinine, Ser: 1.21 mg/dL (ref 0.70–1.30)
GFR calc Af Amer: >60 mL/min (ref >60)
GFR calc non Af Amer: 57 mL/min — ABNORMAL LOW (ref >60)
Glucose, Bld: 99 mg/dL (ref 70–140)
Potassium: 3.4 mmol/L — ABNORMAL LOW (ref 3.5–5.1)
Sodium: 143 mmol/L (ref 136–145)

## 2018-04-08 LAB — CEA (IN HOUSE-CHCC): CEA (CHCC-In House): <1 ng/mL (ref 0.00–5.00)

## 2018-04-08 NOTE — Progress Notes (Signed)
  Eddie Fox OFFICE PROGRESS NOTE   Diagnosis: Goblet cell carcinoid of the appendix  INTERVAL HISTORY:   Mr. Okelley returns as scheduled.  He feels well.  He is active at home.  He is hunting.  He reports intentional weight loss.  No consistent diarrhea.  No abdominal pain.  Objective:  Vital signs in last 24 hours:  Blood pressure 134/61, pulse (!) 58, temperature 98.7 F (37.1 C), temperature source Oral, resp. rate 17, height 5' 8.5" (1.74 m), weight 182 lb 1.6 oz (82.6 kg), SpO2 97 %.    HEENT: Neck without mass Lymphatics: No cervical, supraclavicular, axillary, or inguinal nodes Resp: Lungs clear bilaterally Cardio: Regular rate and rhythm GI: No hepatosplenomegaly, no mass, no apparent ascites Vascular: Trace edema at the right lower leg   Lab Results:    Lab Results  Component Value Date   CEA1 <1.00 04/08/2017   CEA1 1.1 04/08/2017     Medications: I have reviewed the patient's current medications.   Assessment/Plan: 1. 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 T3 NX ? CT 04/08/2017-no evidence of recurrent carcinoma  2. Acute appendicitis, status post an appendectomy 05/16/2016  3. Hypertension  4. History of an abdominal wall melanoma in 1995  5. Hyperlipidemia   Disposition: Mr. Cromie is in clinical remission from the goblet cell carcinoid tumor.  We discussed the indication for surveillance CT scans.  We decided against further CT scans.  He will return for an office visit and CEA/chromogranin in 1 year.  15 minutes were spent with the patient today.  The majority of the time was used for counseling and coordination of care.  Betsy Coder, MD  04/08/2018  11:59 AM

## 2018-04-08 NOTE — Telephone Encounter (Signed)
-----   Message from Ladell Pier, MD sent at 04/08/2018  3:04 PM EDT ----- Please call patient, the CEA is normal

## 2018-04-08 NOTE — Telephone Encounter (Signed)
Scheduled appt per 4/23 los - Gave patient aVS and calender per los.

## 2018-04-10 ENCOUNTER — Telehealth: Payer: Self-pay

## 2018-04-10 LAB — CHROMOGRANIN A: Chromogranin A: 2 nmol/L (ref 0–5)

## 2018-04-10 NOTE — Telephone Encounter (Signed)
Can not reach pt regarding lab work  No answer on home or cell

## 2018-04-10 NOTE — Telephone Encounter (Signed)
Pt voiced understanding of message below regarding CEA

## 2018-04-15 ENCOUNTER — Other Ambulatory Visit: Payer: Self-pay | Admitting: Family Medicine

## 2018-04-15 NOTE — Telephone Encounter (Signed)
Copied from [place] [id number]. Topic: Quick Communication - Rx Refill/Question >> Apr 15, 2018  9:28 AM Bea Graff, NT wrote: Medication: amLODipine (NORVASC)  pt requesting 10mg  tablets instead of 5mg  Has the patient contacted their pharmacy? Yes.   (Agent: If no, request that the patient contact the pharmacy for the refill.) Preferred Pharmacy (with phone number or street name): [place], [place] - [address] [phone] (Phone) [phone] (Fax)     Agent: Please be advised that RX refills may take up to 3 business days. We ask that you follow-up with your pharmacy.

## 2018-04-16 MED ORDER — AMLODIPINE BESYLATE 10 MG PO TABS: 10.0000 mg | ORAL_TABLET | Freq: Every day | ORAL | 3 refills | Status: DC

## 2018-04-16 NOTE — Telephone Encounter (Signed)
Spoke with Eddie Fox.  He is currently taking 5 mg twice daily and would like to change to 10 mg once daily.  Ok to change?

## 2018-04-16 NOTE — Telephone Encounter (Signed)
done

## 2018-04-16 NOTE — Telephone Encounter (Signed)
Has his bp been running high at home?

## 2018-05-08 ENCOUNTER — Ambulatory Visit (INDEPENDENT_AMBULATORY_CARE_PROVIDER_SITE_OTHER): Payer: Medicare Other | Admitting: Family Medicine

## 2018-05-08 ENCOUNTER — Encounter: Payer: Self-pay | Admitting: Family Medicine

## 2018-05-08 VITALS — BP 120/62 | HR 55 | Temp 98.3°F | Ht 68.5 in | Wt 181.5 lb

## 2018-05-08 DIAGNOSIS — M7741 Metatarsalgia, right foot: Secondary | ICD-10-CM

## 2018-05-08 DIAGNOSIS — M21621 Bunionette of right foot: Secondary | ICD-10-CM | POA: Diagnosis not present

## 2018-05-08 NOTE — Patient Instructions (Signed)
Foot and ankle surgeon:  Dr. Wylene Simmer [facility]

## 2018-05-08 NOTE — Progress Notes (Signed)
Dr. Frederico Hamman T. Eddie Menter, MD, [facility] [place], [address] Phone: [phone] Fax: [phone]  05/08/2018  Patient: Eddie Fox, MRN: [id number], DOB: [birth year], 77 y.o.  Primary Physician:  Owens Loffler, MD   Chief Complaint  Patient presents with  . Foot Pain    Right   Subjective:   Eddie Fox is a 77 y.o. very pleasant male patient who presents with the following:  Patient presents with right-sided foot pain, this is long-standing.  He had a trauma decades ago and operative intervention on the side by Dr. Collie Siad.  He still has some hardware in place.  He has pain predominantly in the forefoot in the metatarsal region with significant bunionette and to a lesser extent bunion formation.  He has pain with walking on wood floors and whenever he walks without shoes.  Past Medical History, Surgical History, Social History, Family History, Problem List, Medications, and Allergies have been reviewed and updated if relevant.  Patient Active Problem List   Diagnosis Date Noted  . Acute non-recurrent frontal sinusitis 11/27/2017  . 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 CHOLECYSTECTOMY, LAPAROSCOPIC, HX OF 12/28/2010  . VITAMIN D DEFICIENCY 01/30/2010  . FATTY LIVER DISEASE 08/12/2009  . ESSENTIAL HYPERTENSION 09/14/2008  . COLONIC POLYPS 07/08/2007  . HYPERLIPIDEMIA 07/08/2007  . ERECTILE DYSFUNCTION 07/08/2007  . 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 Benign neoplasm of colon   . Calculus of kidney   . ED (erectile dysfunction)   . IBS (irritable bowel syndrome)   . Melanoma ([place])    history of  . Other and unspecified hyperlipidemia   . Other chronic nonalcoholic liver disease   . Unspecified essential hypertension   . Unspecified vitamin D deficiency     Past Surgical History:  Procedure Laterality  Date  . AMPUTATION     distal phalanx of right thumb- traumatic  . CHOLECYSTECTOMY  10/03/09   lap  . LAMINECTOMY  1995   L 3-T11 Harrington rods in back  . LAPAROSCOPIC APPENDECTOMY N/A 05/16/2016   Procedure: APPENDECTOMY LAPAROSCOPIC;  Surgeon: Greer Pickerel, MD;  Location: [facility];  Service: General;  Laterality: N/A;  . [place]   right upper abd, Dr. Ronnald Ramp  . ORIF ANKLE FRACTURE  1967    Social History   Socioeconomic History  . Marital status: Married    Spouse name: Not on file  . Number of children: 2  . Years of education: Not on file  . Highest education level: Not on file  Occupational History  . Occupation: retired    Comment: retired from [employer] 04/2002  Social Needs  . Financial resource strain: Not on file  . Food insecurity:    Worry: Not on file    Inability: Not on file  . Transportation needs:    Medical: Not on file    Non-medical: Not on file  Tobacco Use  . Smoking status: Never Smoker  . Smokeless tobacco: Never Used  Substance and Sexual Activity  . Alcohol use: No  . Drug use: No  . Sexual activity: Yes    Partners: Female  Lifestyle  . Physical activity:    Days per week: Not on file    Minutes per session: Not on file  . Stress: Not on file  Relationships  . Social connections:  Talks on phone: Not on file    Gets together: Not on file    Attends religious service: Not on file    Active member of club or organization: Not on file    Attends meetings of clubs or organizations: Not on file    Relationship status: Not on file  . Intimate partner violence:    Fear of current or ex partner: Not on file    Emotionally abused: Not on file    Physically abused: Not on file    Forced sexual activity: Not on file  Other Topics Concern  . Not on file  Social History Narrative  . Not on file    Family History  Problem Relation Age of Onset  . Heart disease Mother        CHF  . Hypertension Mother   . Diabetes  Mother   . Stroke Mother   . Hypertension Father   . Heart disease Father        CHF  . Hypertension Sister   . Hypertension Sister   . Depression Neg Hx   . Alcohol abuse Neg Hx   . Drug abuse Neg Hx     Allergies  Allergen Reactions  . Hydrocodone     pts states made him sick.  . Pneumococcal Vaccine     REACTION: significant arm swelling, redness    Medication list reviewed and updated in full in [facility].  GEN: No fevers, chills. Nontoxic. Primarily MSK c/o today. MSK: Detailed in the HPI GI: tolerating PO intake without difficulty Neuro: No numbness, parasthesias, or tingling associated. Otherwise the pertinent positives of the ROS are noted above.   Objective:   BP 120/62   Pulse (!) 55   Temp 98.3 F (36.8 C) (Oral)   Ht 5' 8.5" (1.74 m)   Wt 181 lb 8 oz (82.3 kg)   BMI 27.20 kg/m    GEN: WDWN, NAD, Non-toxic, Alert & Oriented x 3 HEENT: Atraumatic, Normocephalic.  Ears and Nose: No external deformity. EXTR: No clubbing/cyanosis/edema NEURO: Normal gait.  PSYCH: Normally interactive. Conversant. Not depressed or anxious appearing.  Calm demeanor.    Patient has fairly dramatic transverse arch collapse with dropped heads of all of his metatarsal heads with no significant padding in the forefoot, and he also has some significant callus formation, multiple locations, as well as a rather intense bunion on the right side as well as a even more intense bunionette on that side as well.  He is tender to palpation at the bunionette.  The rest of the midfoot and ankle is grossly nontender.  Radiology: No results found.  Assessment and Plan:   Metatarsalgia of right foot  Bunionette of right foot  I made a metatarsal bar, which she seemed to find comfortable.  We could also do a outsole correction or outsole metatarsal bar.  They had some questions regarding surgical correction, and I would be cautious in this regard.  They wanted a name of foot and  ankle surgeon, so I gave him Dr. Nona Dell name  Follow-up: No follow-ups on file.  Signed,  Maud Deed. Vilas Edgerly, MD   Allergies as of 05/08/2018      Reactions   Hydrocodone    pts states made him sick.   Pneumococcal Vaccine    REACTION: significant arm swelling, redness      Medication List        Accurate as of 05/08/18 11:59 PM. Always use your most recent  med list.          amLODipine 10 MG tablet Commonly known as:  NORVASC Take 1 tablet (10 mg total) by mouth daily.   aspirin 325 MG tablet Take 325 mg by mouth daily.   CENTRUM SILVER ULTRA MENS Tabs Take 1 tablet by mouth daily.   fluticasone 50 MCG/ACT nasal spray Commonly known as:  FLONASE Place 2 sprays into both nostrils daily as needed.   hydrochlorothiazide 25 MG tablet Commonly known as:  HYDRODIURIL TAKE 1 TABLET BY MOUTH ONCE DAILY   sildenafil 20 MG tablet Commonly known as:  REVATIO Generic Revatio / Sildanefil 20 mg. 2 - 5 tabs 30 mins prior to intercourse.   simvastatin 40 MG tablet Commonly known as:  ZOCOR TAKE 1 TABLET BY MOUTH ONCE DAILY IN THE EVENING

## 2018-05-21 ENCOUNTER — Encounter: Payer: Self-pay | Admitting: Family Medicine

## 2018-05-21 ENCOUNTER — Ambulatory Visit (INDEPENDENT_AMBULATORY_CARE_PROVIDER_SITE_OTHER): Payer: Medicare Other | Admitting: Family Medicine

## 2018-05-21 VITALS — BP 130/74 | HR 55 | Temp 98.4°F | Ht 68.5 in | Wt 183.0 lb

## 2018-05-21 DIAGNOSIS — L03115 Cellulitis of right lower limb: Secondary | ICD-10-CM | POA: Diagnosis not present

## 2018-05-21 DIAGNOSIS — S81811A Laceration without foreign body, right lower leg, initial encounter: Secondary | ICD-10-CM

## 2018-05-21 MED ORDER — CEPHALEXIN 500 MG PO CAPS: 1000.0000 mg | ORAL_CAPSULE | Freq: Two times a day (BID) | ORAL | 0 refills | Status: DC

## 2018-05-21 NOTE — Progress Notes (Signed)
Dr. Frederico Hamman T. Ramyah Pankowski, MD, [facility] [place], [address] Phone: [phone] Fax: [phone]  05/21/2018  Patient: Eddie Fox, MRN: [id number], DOB: 10-26-1941, 77 y.o.  Primary Physician:  Owens Loffler, MD   Chief Complaint  Patient presents with  . Extremity Laceration    Right Lower Leg   Subjective:   Eddie Fox is a 77 y.o. very pleasant male patient who presents with the following:  R leg, 3 days ago.  At that time he scratched his leg fairly extensively with a rose bush.  He had some bleeding and initially it was okay, but subsequently he developed some redness and warmth surrounding this area and this is been worsening over the last few days.  He has not had any kind of systemic fever.  Redness and warmth now  Past Medical History, Surgical History, Social History, Family History, Problem List, Medications, and Allergies have been reviewed and updated if relevant.  Patient Active Problem List   Diagnosis Date Noted  . Acute non-recurrent frontal sinusitis 11/27/2017  . 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 CHOLECYSTECTOMY, LAPAROSCOPIC, HX OF 12/28/2010  . VITAMIN D DEFICIENCY 01/30/2010  . FATTY LIVER DISEASE 08/12/2009  . ESSENTIAL HYPERTENSION 09/14/2008  . COLONIC POLYPS 07/08/2007  . HYPERLIPIDEMIA 07/08/2007  . ERECTILE DYSFUNCTION 07/08/2007  . 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 Benign neoplasm of colon   . Calculus of kidney   . ED (erectile dysfunction)   . IBS (irritable bowel syndrome)   . Melanoma ([place])    history of  . Other and unspecified hyperlipidemia   . Other chronic nonalcoholic liver disease   . Unspecified essential hypertension   . Unspecified vitamin D deficiency     Past Surgical History:  Procedure Laterality Date  . AMPUTATION     distal phalanx of  right thumb- traumatic  . CHOLECYSTECTOMY  10/03/09   lap  . LAMINECTOMY  1995   L 3-T11 Harrington rods in back  . LAPAROSCOPIC APPENDECTOMY N/A 05/16/2016   Procedure: APPENDECTOMY LAPAROSCOPIC;  Surgeon: Greer Pickerel, MD;  Location: [facility];  Service: General;  Laterality: N/A;  . [place]   right upper abd, Dr. Ronnald Ramp  . ORIF ANKLE FRACTURE  1967    Social History   Socioeconomic History  . Marital status: Married    Spouse name: Not on file  . Number of children: 2  . Years of education: Not on file  . Highest education level: Not on file  Occupational History  . Occupation: retired    Comment: retired from [employer] 04/2002  Social Needs  . Financial resource strain: Not on file  . Food insecurity:    Worry: Not on file    Inability: Not on file  . Transportation needs:    Medical: Not on file    Non-medical: Not on file  Tobacco Use  . Smoking status: Never Smoker  . Smokeless tobacco: Never Used  Substance and Sexual Activity  . Alcohol use: No  . Drug use: No  . Sexual activity: Yes    Partners: Female  Lifestyle  . Physical activity:    Days per week: Not on file    Minutes per session: Not on file  . Stress: Not on file  Relationships  . Social connections:    Talks on  phone: Not on file    Gets together: Not on file    Attends religious service: Not on file    Active member of club or organization: Not on file    Attends meetings of clubs or organizations: Not on file    Relationship status: Not on file  . Intimate partner violence:    Fear of current or ex partner: Not on file    Emotionally abused: Not on file    Physically abused: Not on file    Forced sexual activity: Not on file  Other Topics Concern  . Not on file  Social History Narrative  . Not on file    Family History  Problem Relation Age of Onset  . Heart disease Mother        CHF  . Hypertension Mother   . Diabetes Mother   . Stroke Mother   . Hypertension  Father   . Heart disease Father        CHF  . Hypertension Sister   . Hypertension Sister   . Depression Neg Hx   . Alcohol abuse Neg Hx   . Drug abuse Neg Hx     Allergies  Allergen Reactions  . Hydrocodone     pts states made him sick.  . Pneumococcal Vaccine     REACTION: significant arm swelling, redness    Medication list reviewed and updated in full in [facility].   GEN: No acute illnesses, no fevers, chills. GI: No n/v/d, eating normally Pulm: No SOB Interactive and getting along well at home.  Otherwise, ROS is as per the HPI.  Objective:   BP 130/74   Pulse (!) 55   Temp 98.4 F (36.9 C) (Oral)   Ht 5' 8.5" (1.74 m)   Wt 183 lb (83 kg)   BMI 27.42 kg/m   GEN: WDWN, NAD, Non-toxic, A & O x 3 HEENT: Atraumatic, Normocephalic. Neck supple. No masses, No LAD. Ears and Nose: No external deformity. CV: RRR, No M/G/R. No JVD. No thrill. No extra heart sounds. PULM: CTA B, no wheezes, crackles, rhonchi. No retractions. No resp. distress. No accessory muscle use. EXTR: No c/c/e NEURO Normal gait.  PSYCH: Normally interactive. Conversant. Not depressed or anxious appearing.  Calm demeanor.   Right area of his right leg on the anterior aspect does have approximately an area of 4 cm across of some redness and warmth without fluctuance.  There is a central area consistent with a healing wound.  Laboratory and Imaging Data:  Assessment and Plan:   Cellulitis of right leg  Laceration of right lower extremity excluding thigh, initial encounter  Tetanus is up-to-date, treat the wound with open air, and antibiotics given that looks to be infected.  Follow-up: No follow-ups on file.  Meds ordered this encounter  Medications  . cephALEXin (KEFLEX) 500 MG capsule    Sig: Take 2 capsules (1,000 mg total) by mouth 2 (two) times daily.    Dispense:  40 capsule    Refill:  0   Signed,  Rashunda Passon T. Dylanie Quesenberry, MD   Allergies as of 05/21/2018      Reactions    Hydrocodone    pts states made him sick.   Pneumococcal Vaccine    REACTION: significant arm swelling, redness      Medication List        Accurate as of 05/21/18  4:51 PM. Always use your most recent med list.  amLODipine 10 MG tablet Commonly known as:  NORVASC Take 1 tablet (10 mg total) by mouth daily.   aspirin 325 MG tablet Take 325 mg by mouth daily.   CENTRUM SILVER ULTRA MENS Tabs Take 1 tablet by mouth daily.   cephALEXin 500 MG capsule Commonly known as:  KEFLEX Take 2 capsules (1,000 mg total) by mouth 2 (two) times daily.   fluticasone 50 MCG/ACT nasal spray Commonly known as:  FLONASE Place 2 sprays into both nostrils daily as needed.   hydrochlorothiazide 25 MG tablet Commonly known as:  HYDRODIURIL TAKE 1 TABLET BY MOUTH ONCE DAILY   sildenafil 20 MG tablet Commonly known as:  REVATIO Generic Revatio / Sildanefil 20 mg. 2 - 5 tabs 30 mins prior to intercourse.   simvastatin 40 MG tablet Commonly known as:  ZOCOR TAKE 1 TABLET BY MOUTH ONCE DAILY IN THE EVENING

## 2018-08-20 ENCOUNTER — Other Ambulatory Visit: Payer: Self-pay | Admitting: Family Medicine

## 2018-08-20 DIAGNOSIS — I10 Essential (primary) hypertension: Secondary | ICD-10-CM

## 2018-08-27 ENCOUNTER — Ambulatory Visit (INDEPENDENT_AMBULATORY_CARE_PROVIDER_SITE_OTHER): Payer: Medicare Other

## 2018-08-27 DIAGNOSIS — Z23 Encounter for immunization: Secondary | ICD-10-CM

## 2018-11-19 ENCOUNTER — Telehealth: Payer: Self-pay | Admitting: Family Medicine

## 2018-11-19 NOTE — Telephone Encounter (Signed)
Left message asking pt to call office   Pt has appointment 12/9 in afternoon see if pt can come in @ 11 due to dr copland working a limited schedule right now.

## 2018-11-20 ENCOUNTER — Ambulatory Visit: Payer: Medicare Other

## 2018-11-20 ENCOUNTER — Ambulatory Visit (INDEPENDENT_AMBULATORY_CARE_PROVIDER_SITE_OTHER): Payer: Medicare Other

## 2018-11-20 VITALS — BP 136/70 | HR 56 | Temp 98.6°F | Ht 69.0 in | Wt 179.2 lb

## 2018-11-20 DIAGNOSIS — E786 Lipoprotein deficiency: Secondary | ICD-10-CM | POA: Diagnosis not present

## 2018-11-20 DIAGNOSIS — R944 Abnormal results of kidney function studies: Secondary | ICD-10-CM | POA: Diagnosis not present

## 2018-11-20 DIAGNOSIS — Z0000 Encounter for general adult medical examination without abnormal findings: Secondary | ICD-10-CM

## 2018-11-20 DIAGNOSIS — Z125 Encounter for screening for malignant neoplasm of prostate: Secondary | ICD-10-CM

## 2018-11-20 LAB — CBC WITH DIFFERENTIAL/PLATELET
Basophils Absolute: 0 10*3/uL (ref 0.0–0.1)
Basophils Relative: 0.6 % (ref 0.0–3.0)
Eosinophils Absolute: 0.3 10*3/uL (ref 0.0–0.7)
Eosinophils Relative: 4 % (ref 0.0–5.0)
HCT: 43.9 % (ref 39.0–52.0)
Hemoglobin: 15.4 g/dL (ref 13.0–17.0)
Lymphocytes Relative: 28.7 % (ref 12.0–46.0)
Lymphs Abs: 2.2 10*3/uL (ref 0.7–4.0)
MCHC: 35.2 g/dL (ref 30.0–36.0)
MCV: 93.1 fl (ref 78.0–100.0)
Monocytes Absolute: 0.5 10*3/uL (ref 0.1–1.0)
Monocytes Relative: 6.9 % (ref 3.0–12.0)
Neutro Abs: 4.5 10*3/uL (ref 1.4–7.7)
Neutrophils Relative %: 59.8 % (ref 43.0–77.0)
Platelets: 205 10*3/uL (ref 150.0–400.0)
RBC: 4.71 Mil/uL (ref 4.22–5.81)
RDW: 13.2 % (ref 11.5–15.5)
WBC: 7.5 10*3/uL (ref 4.0–10.5)

## 2018-11-20 LAB — BASIC METABOLIC PANEL
BUN: 21 mg/dL (ref 6–23)
CO2: 29 mEq/L (ref 19–32)
Calcium: 9.9 mg/dL (ref 8.4–10.5)
Chloride: 104 mEq/L (ref 96–112)
Creatinine, Ser: 1.29 mg/dL (ref 0.40–1.50)
GFR: 57.47 mL/min — ABNORMAL LOW (ref >60.00)
Glucose, Bld: 88 mg/dL (ref 70–99)
Potassium: 4.1 mEq/L (ref 3.5–5.1)
Sodium: 141 mEq/L (ref 135–145)

## 2018-11-20 LAB — LIPID PANEL
Cholesterol: 132 mg/dL (ref 0–200)
HDL: 41.4 mg/dL (ref >39.00)
LDL Cholesterol: 73 mg/dL (ref 0–99)
NonHDL: 91.08
Total CHOL/HDL Ratio: 3
Triglycerides: 91 mg/dL (ref 0.0–149.0)
VLDL: 18.2 mg/dL (ref 0.0–40.0)

## 2018-11-20 LAB — HEPATIC FUNCTION PANEL
ALT: 19 U/L (ref 0–53)
AST: 19 U/L (ref 0–37)
Albumin: 4.6 g/dL (ref 3.5–5.2)
Alkaline Phosphatase: 38 U/L — ABNORMAL LOW (ref 39–117)
Bilirubin, Direct: 0.1 mg/dL (ref 0.0–0.3)
Total Bilirubin: 0.6 mg/dL (ref 0.2–1.2)
Total Protein: 7.2 g/dL (ref 6.0–8.3)

## 2018-11-20 NOTE — Patient Instructions (Signed)
Eddie Fox , Thank you for taking time to come for your Medicare Wellness Visit. I appreciate your ongoing commitment to your health goals. Please review the following plan we discussed and let me know if I can assist you in the future.   These are the goals we discussed: Goals    . Patient Stated     Starting 11/20/2018, I will continue to take medications as prescribed.        This is a list of the screening recommended for you and due dates:  Health Maintenance  Topic Date Due  . Pneumonia vaccines (2 of 2 - PPSV23) 02/16/2016  . Tetanus Vaccine  12/23/2019  . Flu Shot  Completed   Preventive Care for Adults  A healthy lifestyle and preventive care can promote health and wellness. Preventive health guidelines for adults include the following key practices.  . A routine yearly physical is a good way to check with your health care provider about your health and preventive screening. It is a chance to share any concerns and updates on your health and to receive a thorough exam.  . Visit your dentist for a routine exam and preventive care every 6 months. Brush your teeth twice a day and floss once a day. Good oral hygiene prevents tooth decay and gum disease.  . The frequency of eye exams is based on your age, health, family medical history, use  of contact lenses, and other factors. Follow your health care provider's recommendations for frequency of eye exams.  . Eat a healthy diet. Foods like vegetables, fruits, whole grains, low-fat dairy products, and lean protein foods contain the nutrients you need without too many calories. Decrease your intake of foods high in solid fats, added sugars, and salt. Eat the right amount of calories for you. Get information about a proper diet from your health care provider, if necessary.  . Regular physical exercise is one of the most important things you can do for your health. Most adults should get at least 150 minutes of moderate-intensity  exercise (any activity that increases your heart rate and causes you to sweat) each week. In addition, most adults need muscle-strengthening exercises on 2 or more days a week.  Silver Sneakers may be a benefit available to you. To determine eligibility, you may visit the website: www.silversneakers.com or contact program at 815-370-9322 Mon-Fri between 8AM-8PM.   . Maintain a healthy weight. The body mass index (BMI) is a screening tool to identify possible weight problems. It provides an estimate of body fat based on height and weight. Your health care provider can find your BMI and can help you achieve or maintain a healthy weight.   For adults 20 years and older: ? A BMI below 18.5 is considered underweight. ? A BMI of 18.5 to 24.9 is normal. ? A BMI of 25 to 29.9 is considered overweight. ? A BMI of 30 and above is considered obese.   . Maintain normal blood lipids and cholesterol levels by exercising and minimizing your intake of saturated fat. Eat a balanced diet with plenty of fruit and vegetables. Blood tests for lipids and cholesterol should begin at age 39 and be repeated every 5 years. If your lipid or cholesterol levels are high, you are over 50, or you are at high risk for heart disease, you may need your cholesterol levels checked more frequently. Ongoing high lipid and cholesterol levels should be treated with medicines if diet and exercise are not working.  Marland Kitchen  If you smoke, find out from your health care provider how to quit. If you do not use tobacco, please do not start.  . If you choose to drink alcohol, please do not consume more than 2 drinks per day. One drink is considered to be 12 ounces (355 mL) of beer, 5 ounces (148 mL) of wine, or 1.5 ounces (44 mL) of liquor.  . If you are 35-34 years old, ask your health care provider if you should take aspirin to prevent strokes.  . Use sunscreen. Apply sunscreen liberally and repeatedly throughout the day. You should seek shade  when your shadow is shorter than you. Protect yourself by wearing long sleeves, pants, a wide-brimmed hat, and sunglasses year round, whenever you are outdoors.  . Once a month, do a whole body skin exam, using a mirror to look at the skin on your back. Tell your health care provider of new moles, moles that have irregular borders, moles that are larger than a pencil eraser, or moles that have changed in shape or color.

## 2018-11-20 NOTE — Progress Notes (Signed)
Subjective:   Eddie Fox is a 77 y.o. male who presents for Medicare Annual/Subsequent preventive examination.  Review of Systems:  n/a Cardiac Risk Factors include: advanced age (>78men, >36 women);male gender;dyslipidemia;hypertension     Objective:    Vitals: BP 136/70 (BP Location: Right Arm, Patient Position: Sitting, Cuff Size: Large)   Pulse (!) 56   Temp 98.6 F (37 C) (Oral)   Ht 5\' 9"  (1.753 m) Comment: shoes  Wt 179 lb 4 oz (81.3 kg)   SpO2 94%   BMI 26.47 kg/m   Body mass index is 26.47 kg/m.  Advanced Directives 11/20/2018 04/08/2018 04/09/2017 11/07/2016 06/14/2016 05/15/2016  Does Patient Have a Medical Advance Directive? No No No No No No  Would patient like information on creating a medical advance directive? No - Patient declined No - Patient declined - (No Data) No - patient declined information No - patient declined information    Tobacco Social History   Tobacco Use  Smoking Status Never Smoker  Smokeless Tobacco Never Used     Counseling given: No   Clinical Intake:  Pre-visit preparation completed: Yes  Pain : No/denies pain Pain Score: 0-No pain     Nutritional Status: BMI 25 -29 Overweight Nutritional Risks: None Diabetes: No  How often do you need to have someone help you when you read instructions, pamphlets, or other written materials from your doctor or pharmacy?: 1 - Never What is the last grade level you completed in school?: 12th grade  Interpreter Needed?: No  Comments: pt lives with spouse Information entered by :: LPinson, LPN  Past Medical History:  Diagnosis Date  . Benign neoplasm of colon   . Calculus of kidney   . ED (erectile dysfunction)   . IBS (irritable bowel syndrome)   . Melanoma ([place])    history of  . Other and unspecified hyperlipidemia   . Other chronic nonalcoholic liver disease   . Unspecified essential hypertension   . Unspecified vitamin D deficiency    Past Surgical History:  Procedure  Laterality Date  . AMPUTATION     distal phalanx of right thumb- traumatic  . CHOLECYSTECTOMY  10/03/09   lap  . LAMINECTOMY  1995   L 3-T11 Harrington rods in back  . LAPAROSCOPIC APPENDECTOMY N/A 05/16/2016   Procedure: APPENDECTOMY LAPAROSCOPIC;  Surgeon: Greer Pickerel, MD;  Location: [facility];  Service: General;  Laterality: N/A;  . [place]   right upper abd, Dr. Ronnald Ramp  . ORIF ANKLE FRACTURE  1967   Family History  Problem Relation Age of Onset  . Heart disease Mother        CHF  . Hypertension Mother   . Diabetes Mother   . Stroke Mother   . Hypertension Father   . Heart disease Father        CHF  . Hypertension Sister   . Hypertension Sister   . Depression Neg Hx   . Alcohol abuse Neg Hx   . Drug abuse Neg Hx    Social History   Socioeconomic History  . Marital status: Married    Spouse name: Not on file  . Number of children: 2  . Years of education: Not on file  . Highest education level: Not on file  Occupational History  . Occupation: retired    Comment: retired from [employer] 04/2002  Social Needs  . Financial resource strain: Not on file  . Food insecurity:    Worry: Not on  file    Inability: Not on file  . Transportation needs:    Medical: Not on file    Non-medical: Not on file  Tobacco Use  . Smoking status: Never Smoker  . Smokeless tobacco: Never Used  Substance and Sexual Activity  . Alcohol use: No  . Drug use: No  . Sexual activity: Yes    Partners: Female  Lifestyle  . Physical activity:    Days per week: Not on file    Minutes per session: Not on file  . Stress: Not on file  Relationships  . Social connections:    Talks on phone: Not on file    Gets together: Not on file    Attends religious service: Not on file    Active member of club or organization: Not on file    Attends meetings of clubs or organizations: Not on file    Relationship status: Not on file  Other Topics Concern  . Not on file  Social History  Narrative  . Not on file    Outpatient Encounter Medications as of 11/20/2018  Medication Sig  . amLODipine (NORVASC) 10 MG tablet Take 1 tablet (10 mg total) by mouth daily.  Marland Kitchen aspirin 325 MG tablet Take 325 mg by mouth daily.  . fluticasone (FLONASE) 50 MCG/ACT nasal spray Place 2 sprays into both nostrils daily as needed.  . hydrochlorothiazide (HYDRODIURIL) 25 MG tablet TAKE 1 TABLET BY MOUTH ONCE DAILY  . Multiple Vitamins-Minerals (CENTRUM SILVER ULTRA MENS) TABS Take 1 tablet by mouth daily.  . sildenafil (REVATIO) 20 MG tablet Generic Revatio / Sildanefil 20 mg. 2 - 5 tabs 30 mins prior to intercourse.  . simvastatin (ZOCOR) 40 MG tablet TAKE 1 TABLET BY MOUTH ONCE DAILY IN THE EVENING  . [DISCONTINUED] cephALEXin (KEFLEX) 500 MG capsule Take 2 capsules (1,000 mg total) by mouth 2 (two) times daily.   No facility-administered encounter medications on file as of 11/20/2018.     Activities of Daily Living In your present state of health, do you have any difficulty performing the following activities: 11/20/2018  Hearing? Y  Vision? N  Difficulty concentrating or making decisions? N  Walking or climbing stairs? N  Dressing or bathing? N  Doing errands, shopping? N  Preparing Food and eating ? N  Using the Toilet? N  In the past six months, have you accidently leaked urine? N  Do you have problems with loss of bowel control? N  Managing your Medications? N  Managing your Finances? N  Housekeeping or managing your Housekeeping? N  Some recent data might be hidden    Patient Care Team: Owens Loffler, MD as PCP - General Danella Sensing, MD as Consulting Physician (Dermatology) Katy Apo, MD as Consulting Physician (Ophthalmology)   Assessment:   This is a routine wellness examination for Eddie Fox.   Hearing Screening   125Hz  250Hz  500Hz  1000Hz  2000Hz  3000Hz  4000Hz  6000Hz  8000Hz   Right ear:   40 0 0  40    Left ear:   0 0 0  0      Visual Acuity Screening   Right eye  Left eye Both eyes  Without correction: 20/25 20/25 20/25   With correction:       Exercise Activities and Dietary recommendations Current Exercise Habits: The patient does not participate in regular exercise at present, Exercise limited by: None identified  Goals    . Patient Stated     Starting 11/20/2018, I will continue to take medications as  prescribed.        Fall Risk Fall Risk  11/20/2018 11/20/2017 11/07/2016 06/13/2015 05/19/2014  Falls in the past year? 1 No No No No  Comment 2 falls while walking in woods - - - -  Number falls in past yr: 1 - - - -  Injury with Fall? 0 - - - -   Depression Screen PHQ 2/9 Scores 11/20/2018 11/20/2017 11/07/2016 11/07/2016  PHQ - 2 Score 0 0 0 0  PHQ- 9 Score 0 - - -    Cognitive Function MMSE - Mini Mental State Exam 11/20/2018 11/07/2016  Orientation to time 5 5  Orientation to Place 5 5  Registration 3 3  Attention/ Calculation 0 0  Recall 3 3  Language- name 2 objects 0 0  Language- repeat 1 1  Language- follow 3 step command 3 3  Language- read & follow direction 0 0  Write a sentence 0 0  Copy design 0 0  Total score 20 20     PLEASE NOTE: A Mini-Cog screen was completed. Maximum score is 20. A value of 0 denotes this part of Folstein MMSE was not completed or the patient failed this part of the Mini-Cog screening.   Mini-Cog Screening Orientation to Time - Max 5 pts Orientation to Place - Max 5 pts Registration - Max 3 pts Recall - Max 3 pts Language Repeat - Max 1 pts Language Follow 3 Step Command - Max 3 pts     Immunization History  Administered Date(s) Administered  . Influenza Split 12/25/2011  . Influenza,inj,Quad PF,6+ Mos 08/19/2013, 08/26/2014, 09/14/2015, 09/11/2016, 08/28/2017, 08/27/2018  . Pneumococcal Conjugate-13 02/16/2015  . Td 03/24/2002, 12/22/2009    Screening Tests Health Maintenance  Topic Date Due  . TETANUS/TDAP  12/23/2019  . INFLUENZA VACCINE  Completed       Plan:     I have  personally reviewed, addressed, and noted the following in the patient's chart:  A. Medical and social history B. Use of alcohol, tobacco or illicit drugs  C. Current medications and supplements D. Functional ability and status E.  Nutritional status F.  Physical activity G. Advance directives H. List of other physicians I.  Hospitalizations, surgeries, and ER visits in previous 12 months J.  [place] to include hearing, vision, cognitive, depression L. Referrals and appointments - none  In addition, I have reviewed and discussed with patient certain preventive protocols, quality metrics, and best practice recommendations. A written personalized care plan for preventive services as well as general preventive health recommendations were provided to patient.  See attached scanned questionnaire for additional information.   Signed,   Lindell Noe, MHA, BS, LPN Health Coach

## 2018-11-20 NOTE — Progress Notes (Addendum)
PCP notes:   Health maintenance:  No gaps identified.  Abnormal screenings:   Hearing - failed  Hearing Screening   125Hz  250Hz  500Hz  1000Hz  2000Hz  3000Hz  4000Hz  6000Hz  8000Hz   Right ear:   40 0 0  40    Left ear:   0 0 0  0     Fall risk - hx of multiple falls Fall Risk  11/20/2018 11/20/2017 11/07/2016 06/13/2015 05/19/2014  Falls in the past year? 1 No No No No  Comment 2 falls while walking in woods - - - -  Number falls in past yr: 1 - - - -  Injury with Fall? 0 - - - -    Patient concerns:   None  Nurse concerns:  None  Next PCP appt:   11/24/18 @ 1100  I reviewed health advisor's note, was available for consultation on the day of service listed in this note, and agree with documentation and plan. Elsie Stain, MD.

## 2018-11-21 LAB — PSA, TOTAL AND FREE
PSA, % Free: 19 % (calc) — ABNORMAL LOW (ref >25)
PSA, Free: 0.6 ng/mL
PSA, Total: 3.2 ng/mL (ref <=4.0)

## 2018-11-24 ENCOUNTER — Encounter: Payer: Self-pay | Admitting: Family Medicine

## 2018-11-24 ENCOUNTER — Ambulatory Visit (INDEPENDENT_AMBULATORY_CARE_PROVIDER_SITE_OTHER): Payer: Medicare Other | Admitting: Family Medicine

## 2018-11-24 VITALS — BP 140/66 | HR 57 | Temp 97.6°F | Ht 69.0 in | Wt 178.5 lb

## 2018-11-24 DIAGNOSIS — I10 Essential (primary) hypertension: Secondary | ICD-10-CM

## 2018-11-24 DIAGNOSIS — Z0000 Encounter for general adult medical examination without abnormal findings: Secondary | ICD-10-CM

## 2018-11-24 DIAGNOSIS — C181 Malignant neoplasm of appendix: Secondary | ICD-10-CM

## 2018-11-24 DIAGNOSIS — M19012 Primary osteoarthritis, left shoulder: Secondary | ICD-10-CM

## 2018-11-24 DIAGNOSIS — Z0001 Encounter for general adult medical examination with abnormal findings: Secondary | ICD-10-CM | POA: Diagnosis not present

## 2018-11-24 MED ORDER — SIMVASTATIN 40 MG PO TABS: ORAL_TABLET | ORAL | 3 refills | Status: DC

## 2018-11-24 MED ORDER — HYDROCHLOROTHIAZIDE 25 MG PO TABS: 25.0000 mg | ORAL_TABLET | Freq: Every day | ORAL | 3 refills | Status: DC

## 2018-11-24 MED ORDER — METHYLPREDNISOLONE ACETATE 40 MG/ML IJ SUSP
80.0000 mg | Freq: Once | INTRAMUSCULAR | Status: AC
  Administered 2018-11-24: 80 mg via INTRA_ARTICULAR

## 2018-11-24 NOTE — Addendum Note (Signed)
Addended by: Carter Kitten on: 11/24/2018 11:46 AM   Modules accepted: Orders

## 2018-11-24 NOTE — Progress Notes (Signed)
Dr. Frederico Hamman T. Jozee Hammer, MD, [facility] [place], [address] Phone: [phone] Fax: [phone]  11/24/2018  Patient: Eddie Fox, MRN: [id number], DOB: 09-09-1941, 77 y.o.  Primary Physician:  Owens Loffler, MD   Chief Complaint  Patient presents with  . Medicare Wellness    Part 2   Subjective:   Eddie Fox is a 77 y.o. pleasant patient who presents with the following:  Preventative Health Maintenance Visit:  Health Maintenance Summary Reviewed and updated, unless pt declines services.  Tobacco History Reviewed. 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 TETANUS/TDAP  12/23/2019  . INFLUENZA VACCINE  Completed   Immunization History  Administered Date(s) Administered  . Influenza Split 12/25/2011  . Influenza,inj,Quad PF,6+ Mos 08/19/2013, 08/26/2014, 09/14/2015, 09/11/2016, 08/28/2017, 08/27/2018  . Pneumococcal Conjugate-13 02/16/2015  . Td 03/24/2002, 12/22/2009   Patient Active Problem List   Diagnosis Date Noted  . 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 CHOLECYSTECTOMY, LAPAROSCOPIC, HX OF 12/28/2010  . VITAMIN D DEFICIENCY 01/30/2010  . FATTY LIVER DISEASE 08/12/2009  . ESSENTIAL HYPERTENSION 09/14/2008  . COLONIC POLYPS 07/08/2007  . HYPERLIPIDEMIA 07/08/2007  . ERECTILE DYSFUNCTION 07/08/2007  . 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 Benign neoplasm of colon   . Calculus of kidney   . ED (erectile dysfunction)   . IBS (irritable bowel syndrome)   .  Melanoma ([place])    history of  . Other and unspecified hyperlipidemia   . Other chronic nonalcoholic liver disease   . Unspecified essential hypertension   . Unspecified vitamin D deficiency    Past Surgical History:  Procedure Laterality Date  . AMPUTATION     distal phalanx of right thumb- traumatic  . CHOLECYSTECTOMY  10/03/09   lap  . LAMINECTOMY  1995   L 3-T11 Harrington rods in back  . LAPAROSCOPIC APPENDECTOMY N/A 05/16/2016   Procedure: APPENDECTOMY LAPAROSCOPIC;  Surgeon: Greer Pickerel, MD;  Location: [facility];  Service: General;  Laterality: N/A;  . [place]   right upper abd, Dr. Ronnald Ramp  . ORIF ANKLE FRACTURE  1967   Social History   Socioeconomic History  . Marital status: Married    Spouse name: Not on file  . Number of children: 2  . Years of education: Not on file  . Highest education level: Not on file  Occupational History  . Occupation: retired    Comment: retired from [employer] 04/2002  Social Needs  . Financial resource strain: Not on file  . Food insecurity:    Worry: Not on file    Inability: Not on file  . Transportation needs:    Medical: Not on file    Non-medical: Not on file  Tobacco Use  . Smoking status: Never Smoker  . Smokeless tobacco: Never Used  Substance and Sexual Activity  . Alcohol use: No  . Drug use: No  . Sexual activity: Yes    Partners: Female  Lifestyle  . Physical activity:    Days  per week: Not on file    Minutes per session: Not on file  . Stress: Not on file  Relationships  . Social connections:    Talks on phone: Not on file    Gets together: Not on file    Attends religious service: Not on file    Active member of club or organization: Not on file    Attends meetings of clubs or organizations: Not on file    Relationship status: Not on file  . Intimate partner violence:    Fear of current or ex partner: Not on file    Emotionally abused: Not on file    Physically abused: Not on file     Forced sexual activity: Not on file  Other Topics Concern  . Not on file  Social History Narrative  . Not on file   Family History  Problem Relation Age of Onset  . Heart disease Mother        CHF  . Hypertension Mother   . Diabetes Mother   . Stroke Mother   . Hypertension Father   . Heart disease Father        CHF  . Hypertension Sister   . Hypertension Sister   . Depression Neg Hx   . Alcohol abuse Neg Hx   . Drug abuse Neg Hx    Allergies  Allergen Reactions  . Hydrocodone     pts states made him sick.  . Pneumococcal Vaccine     REACTION: significant arm swelling, redness    Medication list has been reviewed and updated.   General: Denies fever, chills, sweats. No significant weight loss. Eyes: Denies blurring,significant itching ENT: Denies earache, sore throat, and hoarseness. Cardiovascular: Denies chest pains, palpitations, dyspnea on exertion Respiratory: Denies cough, dyspnea at rest,wheeezing Breast: no concerns about lumps GI: Denies nausea, vomiting, diarrhea, constipation, change in bowel habits, abdominal pain, melena, hematochezia GU: Denies penile discharge, ED, urinary flow / outflow problems. No STD concerns. Musculoskeletal: Denies back pain, joint pain Derm: Denies rash, itching Neuro: Denies  paresthesias, frequent falls, frequent headaches Psych: Denies depression, anxiety Endocrine: Denies cold intolerance, heat intolerance, polydipsia Heme: Denies enlarged lymph nodes Allergy: No hayfever  Objective:   BP 140/66   Pulse (!) 57   Temp 97.6 F (36.4 C) (Oral)   Ht '5\' 9"'$  (1.753 m)   Wt 178 lb 8 oz (81 kg)   BMI 26.36 kg/m  Ideal Body Weight: Weight in (lb) to have BMI = 25: 168.9  No exam data present  GEN: well developed, well nourished, no acute distress Eyes: conjunctiva and lids normal, PERRLA, EOMI ENT: TM clear, nares clear, oral exam WNL Neck: supple, no lymphadenopathy, no thyromegaly, no JVD Pulm: clear to auscultation  and percussion, respiratory effort normal CV: regular rate and rhythm, S1-S2, no murmur, rub or gallop, no bruits, peripheral pulses normal and symmetric, no cyanosis, clubbing, edema or varicosities GI: soft, non-tender; no hepatosplenomegaly, masses; active bowel sounds all quadrants GU: no hernia, testicular mass, penile discharge Lymph: no cervical, axillary or inguinal adenopathy MSK: gait normal, muscle tone and strength WNL, no joint swelling, effusions, discoloration  L shoulder with pain at terminal motion str 5/5 Some mild crepitus  SKIN: clear, good turgor, color WNL, no rashes, lesions, or ulcerations Neuro: normal mental status, normal strength, sensation, and motion Psych: alert; oriented to person, place and time, normally interactive and not anxious or depressed in appearance. All labs reviewed with patient.  Lipids: Lab Results  Component Value Date   CHOL 132 11/20/2018   Lab Results  Component Value Date   HDL 41.40 11/20/2018   Lab Results  Component Value Date   LDLCALC 73 11/20/2018   Lab Results  Component Value Date   TRIG 91.0 11/20/2018   Lab Results  Component Value Date   CHOLHDL 3 11/20/2018   CBC: CBC Latest Ref Rng & Units 11/20/2018 11/19/2017 10/24/2016  WBC 4.0 - 10.5 K/uL 7.5 6.0 7.4  Hemoglobin 13.0 - 17.0 g/dL 15.4 15.4 15.4  Hematocrit 39.0 - 52.0 % 43.9 45.4 45.4  Platelets 150.0 - 400.0 K/uL 205.0 190.0 841.6    Basic Metabolic Panel:    Component Value Date/Time   NA 141 11/20/2018 1245   NA 145 04/08/2017 1015   K 4.1 11/20/2018 1245   K 3.2 (L) 04/08/2017 1015   CL 104 11/20/2018 1245   CO2 29 11/20/2018 1245   CO2 29 04/08/2017 1015   BUN 21 11/20/2018 1245   BUN 17.8 04/08/2017 1015   CREATININE 1.29 11/20/2018 1245   CREATININE 1.1 04/08/2017 1015   GLUCOSE 88 11/20/2018 1245   GLUCOSE 87 04/08/2017 1015   CALCIUM 9.9 11/20/2018 1245   CALCIUM 9.3 04/08/2017 1015   Hepatic Function Latest Ref Rng & Units  11/20/2018 11/19/2017 04/08/2017  Total Protein 6.0 - 8.3 g/dL 7.2 7.0 6.9  Albumin 3.5 - 5.2 g/dL 4.6 4.7 4.1  AST 0 - 37 U/L '19 16 17  '$ ALT 0 - 53 U/L '19 16 22  '$ Alk Phosphatase 39 - 117 U/L 38(L) 44 43  Total Bilirubin 0.2 - 1.2 mg/dL 0.6 0.7 0.55  Bilirubin, Direct 0.0 - 0.3 mg/dL 0.1 0.1 -    Lab Results  Component Value Date   TSH 3.27 05/12/2014   Lab Results  Component Value Date   PSA 1.79 11/19/2017   PSA 1.66 05/24/2016   PSA 1.17 06/07/2015    Assessment and Plan:   Routine general medical examination at a health care facility  Appendix carcinoma [facility])  Essential hypertension, benign - Plan: hydrochlorothiazide (HYDRODIURIL) 25 MG tablet  Glenohumeral arthritis, left  Did cologuard.  Intraarticular shoulder injection for [place] OA  Intrarticular Shoulder Injection, LEFT Date of procedure: 11/24/2018 Verbal consent was obtained from the patient. Risks including infection explained and contrasted with benefits and alternatives. Patient prepped with Chloraprep and Ethyl Chloride used for anesthesia. An intraarticular shoulder injection was performed using the posterior approach; needle placed into joint capsule without difficulty. The patient tolerated the procedure well and had decreased pain post injection. No complications. Injection: 8 cc of Lidocaine 1% and 2 mL Depo-Medrol 40 mg. Needle: 21 gauge, 2 inch   Health Maintenance Exam: The patient's preventative maintenance and recommended screening tests for an annual wellness exam were reviewed in full today. Brought up to date unless services declined.  Counselled on the importance of diet, exercise, and its role in overall health and mortality. The patient's FH and SH was reviewed, including their home life, tobacco status, and drug and alcohol status.  Follow-up in 1 year for physical exam or additional follow-up below.  Follow-up: No follow-ups on file. Or follow-up in 1 year if not  noted.  Signed,  Maud Deed. Victoriana Aziz, MD   Allergies as of 11/24/2018      Reactions   Hydrocodone    pts states made him sick.   Pneumococcal Vaccine    REACTION: significant arm swelling, redness      Medication List  Accurate as of 11/24/18 11:39 AM. Always use your most recent med list.          amLODipine 10 MG tablet Commonly known as:  NORVASC Take 1 tablet (10 mg total) by mouth daily.   aspirin 325 MG tablet Take 325 mg by mouth daily.   CALCIUM+D3 PO Take 1 tablet by mouth every morning.   CENTRUM SILVER ULTRA MENS Tabs Take 1 tablet by mouth daily.   fluticasone 50 MCG/ACT nasal spray Commonly known as:  FLONASE Place 2 sprays into both nostrils daily as needed.   hydrochlorothiazide 25 MG tablet Commonly known as:  HYDRODIURIL Take 1 tablet (25 mg total) by mouth daily.   sildenafil 20 MG tablet Commonly known as:  REVATIO Generic Revatio / Sildanefil 20 mg. 2 - 5 tabs 30 mins prior to intercourse.   simvastatin 40 MG tablet Commonly known as:  ZOCOR TAKE 1 TABLET BY MOUTH ONCE DAILY IN THE EVENING

## 2019-01-02 ENCOUNTER — Encounter: Payer: Self-pay | Admitting: Primary Care

## 2019-01-02 ENCOUNTER — Ambulatory Visit (INDEPENDENT_AMBULATORY_CARE_PROVIDER_SITE_OTHER): Payer: Medicare Other | Admitting: Primary Care

## 2019-01-02 ENCOUNTER — Ambulatory Visit (INDEPENDENT_AMBULATORY_CARE_PROVIDER_SITE_OTHER)
Admission: RE | Admit: 2019-01-02 | Discharge: 2019-01-02 | Disposition: A | Discharge status: Home / Self Care | Payer: Medicare Other | Source: Ambulatory Visit | Attending: Primary Care | Admitting: Primary Care

## 2019-01-02 VITALS — BP 126/80 | HR 53 | Temp 97.8°F | Ht 69.0 in | Wt 180.8 lb

## 2019-01-02 DIAGNOSIS — M79672 Pain in left foot: Secondary | ICD-10-CM | POA: Diagnosis not present

## 2019-01-02 NOTE — Progress Notes (Signed)
Subjective:    Patient ID: Eddie Fox, male    DOB: 16-Jul-1941, 78 y.o.   MRN: [id number]  HPI  Eddie Fox is a 78 year old male with a history of hypertension who presents today with a chief complaint of foot pain.  One week ago he was laying in his bed and got one of his usual cramps in his feet, he got up to step on his foot and heard a pop. This was 6 nights ago. He has since noticed discomfort to the left lateral side of his foot when walking for prolonged periods of time or walking on uneven surfaces.   He originally noticed a darker red/orange spot to the site of the pain earlier this week, it has improved. He's been wrapping his foot with an ACE bandage, taking Tylenol with some improvement.   Review of Systems  Musculoskeletal: Positive for arthralgias.  Skin: Positive for color change.  Neurological: Negative for weakness and numbness.       Past Medical History:  Diagnosis Date  . Benign neoplasm of colon   . Calculus of kidney   . ED (erectile dysfunction)   . IBS (irritable bowel syndrome)   . Melanoma ([place])    history of  . Other and unspecified hyperlipidemia   . Other chronic nonalcoholic liver disease   . Unspecified essential hypertension   . Unspecified vitamin D deficiency      Social History   Socioeconomic History  . Marital status: Married    Spouse name: Not on file  . Number of children: 2  . Years of education: Not on file  . Highest education level: Not on file  Occupational History  . Occupation: retired    Comment: retired from [employer] 04/2002  Social Needs  . Financial resource strain: Not on file  . Food insecurity:    Worry: Not on file    Inability: Not on file  . Transportation needs:    Medical: Not on file    Non-medical: Not on file  Tobacco Use  . Smoking status: Never Smoker  . Smokeless tobacco: Never Used  Substance and Sexual Activity  . Alcohol use: No  . Drug use: No  . Sexual activity: Yes   Partners: Female  Lifestyle  . Physical activity:    Days per week: Not on file    Minutes per session: Not on file  . Stress: Not on file  Relationships  . Social connections:    Talks on phone: Not on file    Gets together: Not on file    Attends religious service: Not on file    Active member of club or organization: Not on file    Attends meetings of clubs or organizations: Not on file    Relationship status: Not on file  . Intimate partner violence:    Fear of current or ex partner: Not on file    Emotionally abused: Not on file    Physically abused: Not on file    Forced sexual activity: Not on file  Other Topics Concern  . Not on file  Social History Narrative  . Not on file    Past Surgical History:  Procedure Laterality Date  . AMPUTATION     distal phalanx of right thumb- traumatic  . CHOLECYSTECTOMY  10/03/09   lap  . LAMINECTOMY  1995   L 3-T11 Harrington rods in back  . LAPAROSCOPIC APPENDECTOMY N/A 05/16/2016   Procedure: APPENDECTOMY LAPAROSCOPIC;  Surgeon: Greer Pickerel, MD;  Location: [facility];  Service: General;  Laterality: N/A;  . [place]   right upper abd, Dr. Ronnald Ramp  . ORIF ANKLE FRACTURE  1967    Family History  Problem Relation Age of Onset  . Heart disease Mother        CHF  . Hypertension Mother   . Diabetes Mother   . Stroke Mother   . Hypertension Father   . Heart disease Father        CHF  . Hypertension Sister   . Hypertension Sister   . Depression Neg Hx   . Alcohol abuse Neg Hx   . Drug abuse Neg Hx     Allergies  Allergen Reactions  . Hydrocodone     pts states made him sick.  . Pneumococcal Vaccine     REACTION: significant arm swelling, redness    Current Outpatient Medications on File Prior to Visit  Medication Sig Dispense Refill  . amLODipine (NORVASC) 10 MG tablet Take 1 tablet (10 mg total) by mouth daily. 90 tablet 3  . aspirin 325 MG tablet Take 325 mg by mouth daily.    . Calcium  Carb-Cholecalciferol (CALCIUM+D3 PO) Take 1 tablet by mouth every morning.    . fluticasone (FLONASE) 50 MCG/ACT nasal spray Place 2 sprays into both nostrils daily as needed. 16 g 5  . hydrochlorothiazide (HYDRODIURIL) 25 MG tablet Take 1 tablet (25 mg total) by mouth daily. 90 tablet 3  . Multiple Vitamins-Minerals (CENTRUM SILVER ULTRA MENS) TABS Take 1 tablet by mouth daily.    . sildenafil (REVATIO) 20 MG tablet Generic Revatio / Sildanefil 20 mg. 2 - 5 tabs 30 mins prior to intercourse. 6 tablet 11  . simvastatin (ZOCOR) 40 MG tablet TAKE 1 TABLET BY MOUTH ONCE DAILY IN THE EVENING 90 tablet 3   No current facility-administered medications on file prior to visit.     BP 126/80   Pulse (!) 53   Temp 97.8 F (36.6 C) (Oral)   Ht 5\' 9"  (1.753 m)   Wt 180 lb 12 oz (82 kg)   SpO2 94%   BMI 26.69 kg/m    Objective:   Physical Exam  Constitutional: He appears well-nourished.  Cardiovascular: Normal rate.  Respiratory: Effort normal.  Musculoskeletal:     Left foot: Normal range of motion. Tenderness present. No bony tenderness, swelling or deformity.       Feet:  Skin: Skin is warm and dry. No erythema.           Assessment & Plan:  Foot Pain:  Acute for the last 5 days, overall improved. Suspect tendon vs muscle strain. No obvious deformity. He is ambulatory in our clinic without difficulty.  Check plain films given sudden sensation of pain with pressure. Discussed support with ACE wrap, Tylenol, Ice, elevation.  Pleas Koch, NP

## 2019-01-02 NOTE — Patient Instructions (Signed)
Complete xray(s) prior to leaving today. I will notify you of your results once received.  Use the ACE wrap bandage for support.   You can take Tylenol and apply ice for pain and/or swelling.  Please follow up with Dr. Lorelei Pont if your pain doesn't continue to improve.   It was a pleasure meeting you!

## 2019-03-30 ENCOUNTER — Telehealth: Payer: Self-pay | Admitting: Oncology

## 2019-03-30 NOTE — Telephone Encounter (Signed)
Left vm for patient re new appointment that was given to him. Sending letter in the mail.

## 2019-04-09 ENCOUNTER — Other Ambulatory Visit: Payer: Medicare Other

## 2019-04-09 ENCOUNTER — Telehealth: Payer: Self-pay | Admitting: Oncology

## 2019-04-09 ENCOUNTER — Ambulatory Visit: Payer: Medicare Other | Admitting: Oncology

## 2019-04-09 NOTE — Telephone Encounter (Signed)
Returned call re rescheduling 5/1 appointments. Per patient lab/fu moved to 6/8.

## 2019-04-17 ENCOUNTER — Other Ambulatory Visit: Payer: Medicare Other

## 2019-04-17 ENCOUNTER — Ambulatory Visit: Payer: Medicare Other | Admitting: Oncology

## 2019-05-04 ENCOUNTER — Encounter: Payer: Self-pay | Admitting: Family Medicine

## 2019-05-04 ENCOUNTER — Ambulatory Visit (INDEPENDENT_AMBULATORY_CARE_PROVIDER_SITE_OTHER): Payer: Medicare Other | Admitting: Family Medicine

## 2019-05-04 ENCOUNTER — Ambulatory Visit (INDEPENDENT_AMBULATORY_CARE_PROVIDER_SITE_OTHER)
Admission: RE | Admit: 2019-05-04 | Discharge: 2019-05-04 | Disposition: A | Discharge status: Home / Self Care | Payer: Medicare Other | Source: Ambulatory Visit | Attending: Family Medicine | Admitting: Family Medicine

## 2019-05-04 ENCOUNTER — Other Ambulatory Visit: Payer: Self-pay

## 2019-05-04 VITALS — BP 150/80 | HR 63 | Temp 98.6°F | Ht 69.0 in | Wt 171.8 lb

## 2019-05-04 DIAGNOSIS — M79604 Pain in right leg: Secondary | ICD-10-CM | POA: Diagnosis not present

## 2019-05-04 DIAGNOSIS — S8011XA Contusion of right lower leg, initial encounter: Secondary | ICD-10-CM

## 2019-05-04 DIAGNOSIS — S8991XA Unspecified injury of right lower leg, initial encounter: Secondary | ICD-10-CM | POA: Diagnosis not present

## 2019-05-04 NOTE — Progress Notes (Signed)
Pam Vanalstine T. Berneta Sconyers, MD Primary Care and [place] at [facility] [place], [address] Phone: [phone]  FAX: [phone]  IDAN PRIME - 78 y.o. male  MRN [id number]  Date of Birth: 10-07-1941  Visit Date: 05/04/2019  PCP: Owens Loffler, MD  Referred by: Owens Loffler, MD  Chief Complaint  Patient presents with  . Fall    x 1 week ago  . Leg Pain    Right   Subjective:   BLESSING OZGA is a 78 y.o. very pleasant male patient who presents with the following:  Acute fall 1 week ago:  [place] and R leg hurts a lot and had a large hematoma on the R side. Leg pain.  He is having some difficulty bearing weight, but he has extensive bruising and swelling in the lower extremity.  He does have a large hematoma.  He has a history of prior medial malleoli are fracture treated with ORIF.  R tib/fib  Past Medical History, Surgical History, Social History, Family History, Problem List, Medications, and Allergies have been reviewed and updated if relevant.  Patient Active Problem List   Diagnosis Date Noted  . 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 CHOLECYSTECTOMY, LAPAROSCOPIC, HX OF 12/28/2010  . VITAMIN D DEFICIENCY 01/30/2010  . FATTY LIVER DISEASE 08/12/2009  . ESSENTIAL HYPERTENSION 09/14/2008  . COLONIC POLYPS 07/08/2007  . HYPERLIPIDEMIA 07/08/2007  . ERECTILE DYSFUNCTION 07/08/2007  . 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 Benign neoplasm of colon   . Calculus of kidney   . ED (erectile dysfunction)   . IBS (irritable bowel syndrome)   . Melanoma ([place])    history of  . Other and unspecified hyperlipidemia   . Other chronic nonalcoholic liver disease   . Unspecified essential hypertension   . Unspecified vitamin D deficiency     Past Surgical History:  Procedure Laterality Date  . AMPUTATION     distal  phalanx of right thumb- traumatic  . CHOLECYSTECTOMY  10/03/09   lap  . LAMINECTOMY  1995   L 3-T11 Harrington rods in back  . LAPAROSCOPIC APPENDECTOMY N/A 05/16/2016   Procedure: APPENDECTOMY LAPAROSCOPIC;  Surgeon: Greer Pickerel, MD;  Location: [facility];  Service: General;  Laterality: N/A;  . [place]   right upper abd, Dr. Ronnald Ramp  . ORIF ANKLE FRACTURE  1967    Social History   Socioeconomic History  . Marital status: Married    Spouse name: Not on file  . Number of children: 2  . Years of education: Not on file  . Highest education level: Not on file  Occupational History  . Occupation: retired    Comment: retired from [employer] 04/2002  Social Needs  . Financial resource strain: Not on file  . Food insecurity:    Worry: Not on file    Inability: Not on file  . Transportation needs:    Medical: Not on file    Non-medical: Not on file  Tobacco Use  . Smoking status: Never Smoker  . Smokeless tobacco: Never Used  Substance and Sexual Activity  . Alcohol use: No  . Drug use: No  . Sexual activity: Yes    Partners: Female  Lifestyle  . Physical activity:    Days per week: Not on file    Minutes per session: Not on file  .  Stress: Not on file  Relationships  . Social connections:    Talks on phone: Not on file    Gets together: Not on file    Attends religious service: Not on file    Active member of club or organization: Not on file    Attends meetings of clubs or organizations: Not on file    Relationship status: Not on file  . Intimate partner violence:    Fear of current or ex partner: Not on file    Emotionally abused: Not on file    Physically abused: Not on file    Forced sexual activity: Not on file  Other Topics Concern  . Not on file  Social History Narrative  . Not on file    Family History  Problem Relation Age of Onset  . Heart disease Mother        CHF  . Hypertension Mother   . Diabetes Mother   . Stroke Mother   .  Hypertension Father   . Heart disease Father        CHF  . Hypertension Sister   . Hypertension Sister   . Depression Neg Hx   . Alcohol abuse Neg Hx   . Drug abuse Neg Hx     Allergies  Allergen Reactions  . Hydrocodone     pts states made him sick.  . Pneumococcal Vaccine     REACTION: significant arm swelling, redness    Medication list reviewed and updated in full in [place].  GEN: No fevers, chills. Nontoxic. Primarily MSK c/o today. MSK: Detailed in the HPI GI: tolerating PO intake without difficulty Neuro: No numbness, parasthesias, or tingling associated. Otherwise the pertinent positives of the ROS are noted above.   Objective:   BP (!) 150/80   Pulse 63   Temp 98.6 F (37 C) (Oral)   Ht 5\' 9"  (1.753 m)   Wt 171 lb 12 oz (77.9 kg)   BMI 25.36 kg/m    GEN: WDWN, NAD, Non-toxic, Alert & Oriented x 3 HEENT: Atraumatic, Normocephalic.  Ears and Nose: No external deformity. NEURO: Normal gait.  PSYCH: Normally interactive. Conversant. Not depressed or anxious appearing.  Calm demeanor.    Extensive bruising throughout the right lower extremity with a notable hematoma on the right lateral aspect of the midshaft of the lower extremity.  This is notably tender to palpation.  No redness or warmth.  Radiology: No results found.  Assessment and Plan:   Hematoma of right lower extremity, initial encounter  Lower extremity pain, lateral, right - Plan: DG Tibia/Fibula Right  On tib-fib plain film, there is no evidence of fibular fracture.  Hematoma, expect will resolve in 6-8 weeks.  Follow-up: No follow-ups on file.  Orders Placed This Encounter  Procedures  . DG Tibia/Fibula Right    Signed,  Karn Derk T. Staria Birkhead, MD   Outpatient Encounter Medications as of 05/04/2019  Medication Sig  . amLODipine (NORVASC) 10 MG tablet Take 1 tablet (10 mg total) by mouth daily.  Marland Kitchen aspirin 325 MG tablet Take 325 mg by mouth daily.  . Calcium  Carb-Cholecalciferol (CALCIUM+D3 PO) Take 1 tablet by mouth every morning.  . fluticasone (FLONASE) 50 MCG/ACT nasal spray Place 2 sprays into both nostrils daily as needed.  . hydrochlorothiazide (HYDRODIURIL) 25 MG tablet Take 1 tablet (25 mg total) by mouth daily.  . Multiple Vitamins-Minerals (CENTRUM SILVER ULTRA MENS) TABS Take 1 tablet by mouth daily.  . sildenafil (REVATIO) 20 MG  tablet Generic Revatio / Sildanefil 20 mg. 2 - 5 tabs 30 mins prior to intercourse.  . simvastatin (ZOCOR) 40 MG tablet TAKE 1 TABLET BY MOUTH ONCE DAILY IN THE EVENING   No facility-administered encounter medications on file as of 05/04/2019.

## 2019-05-15 DIAGNOSIS — H52203 Unspecified astigmatism, bilateral: Secondary | ICD-10-CM | POA: Diagnosis not present

## 2019-05-15 DIAGNOSIS — Z961 Presence of intraocular lens: Secondary | ICD-10-CM | POA: Diagnosis not present

## 2019-05-15 DIAGNOSIS — H524 Presbyopia: Secondary | ICD-10-CM | POA: Diagnosis not present

## 2019-05-15 DIAGNOSIS — H26492 Other secondary cataract, left eye: Secondary | ICD-10-CM | POA: Diagnosis not present

## 2019-05-25 ENCOUNTER — Other Ambulatory Visit: Payer: Self-pay

## 2019-05-25 ENCOUNTER — Inpatient Hospital Stay: Payer: Medicare Other | Attending: Oncology

## 2019-05-25 ENCOUNTER — Inpatient Hospital Stay (HOSPITAL_BASED_OUTPATIENT_CLINIC_OR_DEPARTMENT_OTHER): Payer: Medicare Other | Admitting: Oncology

## 2019-05-25 VITALS — BP 147/64 | HR 61 | Temp 98.9°F | Resp 16 | Ht 69.0 in | Wt 173.1 lb

## 2019-05-25 DIAGNOSIS — E785 Hyperlipidemia, unspecified: Secondary | ICD-10-CM

## 2019-05-25 DIAGNOSIS — C7A020 Malignant carcinoid tumor of the appendix: Secondary | ICD-10-CM | POA: Insufficient documentation

## 2019-05-25 DIAGNOSIS — Z85820 Personal history of malignant melanoma of skin: Secondary | ICD-10-CM | POA: Diagnosis not present

## 2019-05-25 DIAGNOSIS — C181 Malignant neoplasm of appendix: Secondary | ICD-10-CM

## 2019-05-25 DIAGNOSIS — I10 Essential (primary) hypertension: Secondary | ICD-10-CM | POA: Diagnosis not present

## 2019-05-25 LAB — CBC WITH DIFFERENTIAL (CANCER CENTER ONLY)
Abs Immature Granulocytes: 0.03 10*3/uL (ref 0.00–0.07)
Basophils Absolute: 0.1 10*3/uL (ref 0.0–0.1)
Basophils Relative: 1 %
Eosinophils Absolute: 0.3 10*3/uL (ref 0.0–0.5)
Eosinophils Relative: 4 %
HCT: 44.1 % (ref 39.0–52.0)
Hemoglobin: 15 g/dL (ref 13.0–17.0)
Immature Granulocytes: 0 %
Lymphocytes Relative: 28 %
Lymphs Abs: 2.2 10*3/uL (ref 0.7–4.0)
MCH: 32 pg (ref 26.0–34.0)
MCHC: 34 g/dL (ref 30.0–36.0)
MCV: 94 fL (ref 80.0–100.0)
Monocytes Absolute: 0.7 10*3/uL (ref 0.1–1.0)
Monocytes Relative: 9 %
Neutro Abs: 4.8 10*3/uL (ref 1.7–7.7)
Neutrophils Relative %: 58 %
Platelet Count: 226 10*3/uL (ref 150–400)
RBC: 4.69 MIL/uL (ref 4.22–5.81)
RDW: 12.8 % (ref 11.5–15.5)
WBC Count: 8.1 10*3/uL (ref 4.0–10.5)
nRBC: 0 % (ref 0.0–0.2)

## 2019-05-25 LAB — CEA (IN HOUSE-CHCC): CEA (CHCC-In House): <1 ng/mL (ref 0.00–5.00)

## 2019-05-25 NOTE — Progress Notes (Signed)
  [facility] OFFICE PROGRESS NOTE   Diagnosis: Goblet cell carcinoid of the appendix  INTERVAL HISTORY:   Mr. Messman returns as scheduled.  He fell in his yard approximately 1 month ago and hit a rock.  He injured the right leg.  He reports bruising extending all the way to the right foot.  He continues to have a "knot "at the lateral right lower leg.  The area is slowly improving.  He has a history of swelling in the right lower leg for many years.  Good appetite.  He relates weight loss to a change in his diet.  No pain or difficulty with bowel function.  He has noted a decrease in his energy level for approximately 1 month.  Objective:  Vital signs in last 24 hours:  There were no vitals taken for this visit.    HEENT: Neck without mass Lymphatics: No cervical, supraclavicular, axillary, or inguinal nodes Resp: Lungs clear bilaterally Cardio: Regular rate and rhythm GI: No hepatosplenomegaly, no mass, nontender Vascular: Trace pitting edema throughout the right lower leg with chronic stasis changes.  At the lateral right lower leg there is a rounded fullness.    Lab Results:  Lab Results  Component Value Date   WBC 7.5 11/20/2018   HGB 15.4 11/20/2018   HCT 43.9 11/20/2018   MCV 93.1 11/20/2018   PLT 205.0 11/20/2018   NEUTROABS 4.5 11/20/2018    CMP  Lab Results  Component Value Date   NA 141 11/20/2018   K 4.1 11/20/2018   CL 104 11/20/2018   CO2 29 11/20/2018   GLUCOSE 88 11/20/2018   BUN 21 11/20/2018   CREATININE 1.29 11/20/2018   CALCIUM 9.9 11/20/2018   PROT 7.2 11/20/2018   ALBUMIN 4.6 11/20/2018   AST 19 11/20/2018   ALT 19 11/20/2018   ALKPHOS 38 (L) 11/20/2018   BILITOT 0.6 11/20/2018   GFRNONAA 57 (L) 04/08/2018   GFRAA >60 04/08/2018    Lab Results  Component Value Date   CEA1 <1.00 04/08/2018     Medications: I have reviewed the patient's current medications.   Assessment/Plan: 1. 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 T3 NX ? CT 04/08/2017-no evidence of recurrent carcinoma  2. Acute appendicitis, status post an appendectomy 05/16/2016  3. Hypertension  4. History of an abdominal wall melanoma in 1995  5. Hyperlipidemia     Disposition: Mr. Miranda is in clinical remission from the goblet cell carcinoid tumor.  We will follow-up on the CEA from today.  He would like to continue follow-up at the Cancer center.  He will return for an office visit in 1 year.  He appears to have a slowly resolving hematoma at the right lower leg.  He will follow-up with Dr. Lorelei Pont if the hematoma does not resolve.  We will check a CBC today with his report of malaise.  Betsy Coder, MD  05/25/2019  12:03 PM

## 2019-05-26 ENCOUNTER — Telehealth: Payer: Self-pay

## 2019-05-26 ENCOUNTER — Telehealth: Payer: Self-pay | Admitting: Oncology

## 2019-05-26 NOTE — Telephone Encounter (Signed)
Scheduled appt per 6/8 los. A calendar will be mailed out. °

## 2019-05-26 NOTE — Telephone Encounter (Signed)
TC per Dr Benay Spice to let him know that his CEA is normal, hemoglobin is normal, follow-up as scheduled, also to follow-up with Dr. Lorelei Pont if the leg swelling does not resolve and for persistent malaise. Patient verbalized understanding. No further problems or concerns at this time.

## 2019-06-01 ENCOUNTER — Other Ambulatory Visit: Payer: Self-pay | Admitting: Internal Medicine

## 2019-06-01 DIAGNOSIS — C181 Malignant neoplasm of appendix: Secondary | ICD-10-CM

## 2019-06-29 ENCOUNTER — Other Ambulatory Visit: Payer: Self-pay | Admitting: Family Medicine

## 2019-07-27 ENCOUNTER — Other Ambulatory Visit: Payer: Self-pay

## 2019-07-27 ENCOUNTER — Ambulatory Visit (INDEPENDENT_AMBULATORY_CARE_PROVIDER_SITE_OTHER): Payer: Medicare Other | Admitting: Podiatry

## 2019-07-27 ENCOUNTER — Ambulatory Visit (INDEPENDENT_AMBULATORY_CARE_PROVIDER_SITE_OTHER): Payer: Medicare Other

## 2019-07-27 VITALS — Temp 97.6°F

## 2019-07-27 DIAGNOSIS — M79672 Pain in left foot: Secondary | ICD-10-CM

## 2019-07-27 DIAGNOSIS — M779 Enthesopathy, unspecified: Secondary | ICD-10-CM

## 2019-07-27 DIAGNOSIS — M79671 Pain in right foot: Secondary | ICD-10-CM | POA: Diagnosis not present

## 2019-07-27 DIAGNOSIS — M216X9 Other acquired deformities of unspecified foot: Secondary | ICD-10-CM

## 2019-07-27 DIAGNOSIS — M778 Other enthesopathies, not elsewhere classified: Secondary | ICD-10-CM

## 2019-07-27 DIAGNOSIS — Q828 Other specified congenital malformations of skin: Secondary | ICD-10-CM | POA: Diagnosis not present

## 2019-07-29 NOTE — Progress Notes (Signed)
Subjective:   Patient ID: Eddie Fox, male   DOB: 78 y.o.   MRN: [id number]   HPI 78 year old male presents the office for concerns of pain to the right foot made worse to submetatarsal 5.  Is not consistent with a house he does water activity.  Is been ongoing for a long time.  He still the cushion is gone to his feet.  He has had the calluses trimmed.  He is ready for care previously.  He has no other concerns today.   Review of Systems  All other systems reviewed and are negative.  Past Medical History:  Diagnosis Date  . Benign neoplasm of colon   . Calculus of kidney   . ED (erectile dysfunction)   . IBS (irritable bowel syndrome)   . Melanoma ([place])    history of  . Other and unspecified hyperlipidemia   . Other chronic nonalcoholic liver disease   . Unspecified essential hypertension   . Unspecified vitamin D deficiency     Past Surgical History:  Procedure Laterality Date  . AMPUTATION     distal phalanx of right thumb- traumatic  . CHOLECYSTECTOMY  10/03/09   lap  . LAMINECTOMY  1995   L 3-T11 Harrington rods in back  . LAPAROSCOPIC APPENDECTOMY N/A 05/16/2016   Procedure: APPENDECTOMY LAPAROSCOPIC;  Surgeon: Greer Pickerel, MD;  Location: [facility];  Service: General;  Laterality: N/A;  . [place]   right upper abd, Dr. Ronnald Ramp  . ORIF ANKLE FRACTURE  1967     Current Outpatient Medications:  .  amLODipine (NORVASC) 10 MG tablet, Take 1 tablet by mouth once daily, Disp: 90 tablet, Rfl: 0 .  aspirin 325 MG tablet, Take 325 mg by mouth daily., Disp: , Rfl:  .  Calcium Carb-Cholecalciferol (CALCIUM+D3 PO), Take 1 tablet by mouth every morning., Disp: , Rfl:  .  fluticasone (FLONASE) 50 MCG/ACT nasal spray, USE 2 SPRAY(S) IN EACH NOSTRIL ONCE DAILY AS NEEDED, Disp: 16 g, Rfl: 1 .  hydrochlorothiazide (HYDRODIURIL) 25 MG tablet, Take 1 tablet (25 mg total) by mouth daily., Disp: 90 tablet, Rfl: 3 .  Multiple Vitamins-Minerals (CENTRUM SILVER ULTRA MENS)  TABS, Take 1 tablet by mouth daily., Disp: , Rfl:  .  sildenafil (REVATIO) 20 MG tablet, Generic Revatio / Sildanefil 20 mg. 2 - 5 tabs 30 mins prior to intercourse., Disp: 6 tablet, Rfl: 11 .  simvastatin (ZOCOR) 40 MG tablet, TAKE 1 TABLET BY MOUTH ONCE DAILY IN THE EVENING, Disp: 90 tablet, Rfl: 3  Allergies  Allergen Reactions  . Hydrocodone     pts states made him sick.  . Pneumococcal Vaccine     REACTION: significant arm swelling, redness    Social History   Socioeconomic History  . Marital status: Married    Spouse name: Not on file  . Number of children: 2  . Years of education: Not on file  . Highest education level: Not on file  Occupational History  . Occupation: retired    Comment: retired from [employer] 04/2002  Social Needs  . Financial resource strain: Not on file  . Food insecurity    Worry: Not on file    Inability: Not on file  . Transportation needs    Medical: Not on file    Non-medical: Not on file  Tobacco Use  . Smoking status: Never Smoker  . Smokeless tobacco: Never Used  Substance and Sexual Activity  . Alcohol use: No  . Drug  use: No  . Sexual activity: Yes    Partners: Female  Lifestyle  . Physical activity    Days per week: Not on file    Minutes per session: Not on file  . Stress: Not on file  Relationships  . Social Herbalist on phone: Not on file    Gets together: Not on file    Attends religious service: Not on file    Active member of club or organization: Not on file    Attends meetings of clubs or organizations: Not on file    Relationship status: Not on file  . Intimate partner violence    Fear of current or ex partner: Not on file    Emotionally abused: Not on file    Physically abused: Not on file    Forced sexual activity: Not on file  Other Topics Concern  . Not on file  Social History Narrative  . Not on file         Objective:  Physical Exam  General: AAO x3, NAD  Dermatological:  Hyperkeratotic lesions present bilateral submetatarsal area mostly submetatarsal 4/5.  No underlying ulceration drainage or signs of infection.  No open lesions.  Vascular: Dorsalis Pedis artery and Posterior Tibial artery pedal pulses are 2/4 bilateral with immedate capillary fill time. Pedal hair growth present. There is no pain with calf compression, swelling, warmth, erythema.   Neruologic: Grossly intact via light touch bilateral.   Musculoskeletal: Prominent metatarsal heads with atrophy of the fat pad.  Muscular strength 5/5 in all groups tested bilateral.  Gait: Unassisted, Nonantalgic.       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  No evidence of acute fracture. -I added metatarsal offloading pads to his inserts I dispensed gel metatarsal pads.  Trula Slade DPM

## 2019-07-31 ENCOUNTER — Other Ambulatory Visit: Payer: Self-pay | Admitting: *Deleted

## 2019-07-31 DIAGNOSIS — I10 Essential (primary) hypertension: Secondary | ICD-10-CM

## 2019-07-31 MED ORDER — HYDROCHLOROTHIAZIDE 25 MG PO TABS: 25.0000 mg | ORAL_TABLET | Freq: Every day | ORAL | 0 refills | Status: DC

## 2019-07-31 MED ORDER — SIMVASTATIN 40 MG PO TABS: ORAL_TABLET | ORAL | 0 refills | Status: DC

## 2019-07-31 MED ORDER — AMLODIPINE BESYLATE 10 MG PO TABS: 10.0000 mg | ORAL_TABLET | Freq: Every day | ORAL | 0 refills | Status: DC

## 2019-08-10 ENCOUNTER — Encounter: Payer: Self-pay | Admitting: Family Medicine

## 2019-08-10 ENCOUNTER — Ambulatory Visit (INDEPENDENT_AMBULATORY_CARE_PROVIDER_SITE_OTHER): Payer: Medicare Other | Admitting: Family Medicine

## 2019-08-10 ENCOUNTER — Other Ambulatory Visit: Payer: Self-pay

## 2019-08-10 VITALS — BP 138/62 | HR 72 | Temp 98.4°F | Ht 69.0 in | Wt 170.5 lb

## 2019-08-10 DIAGNOSIS — M25512 Pain in left shoulder: Secondary | ICD-10-CM | POA: Diagnosis not present

## 2019-08-10 DIAGNOSIS — M19011 Primary osteoarthritis, right shoulder: Secondary | ICD-10-CM | POA: Diagnosis not present

## 2019-08-10 DIAGNOSIS — Z23 Encounter for immunization: Secondary | ICD-10-CM

## 2019-08-10 MED ORDER — METHYLPREDNISOLONE ACETATE 40 MG/ML IJ SUSP
80.0000 mg | Freq: Once | INTRAMUSCULAR | Status: AC
  Administered 2019-08-10: 80 mg via INTRA_ARTICULAR

## 2019-08-10 NOTE — Progress Notes (Signed)
Siobhan Zaro T. Kenzly Rogoff, MD Primary Care and [place] at [facility] [place], [address] Phone: [phone]  FAX: [phone]  GREYSEN DESHAZER - 78 y.o. male  MRN [id number]  Date of Birth: Aug 08, 1941  Visit Date: 08/10/2019  PCP: Owens Loffler, MD  Referred by: Owens Loffler, MD  Chief Complaint  Patient presents with  . Shoulder Pain    Right   Subjective:   AJMAL GROBER is a 78 y.o. very pleasant male patient with Body mass index is 25.18 kg/m. who presents with the following:  R shoulder pain: hurts when laying on it.  Tylenol did help it a little bit.  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  He is having some pain and grinding and popping in his right shoulder.  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  She having a deep ache in the middle of the joint.  Is radiating down through part of his upper extremity but not as far as the elbow.  r shoulder injection  Past Medical History, Surgical History, Social History, Family History, Problem List, Medications, and Allergies have been reviewed and updated if relevant.  Patient Active Problem List   Diagnosis Date Noted  . 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 CHOLECYSTECTOMY, LAPAROSCOPIC, HX OF 12/28/2010  . VITAMIN D DEFICIENCY 01/30/2010  . FATTY LIVER DISEASE 08/12/2009  . ESSENTIAL HYPERTENSION 09/14/2008  . COLONIC POLYPS 07/08/2007  . HYPERLIPIDEMIA 07/08/2007  . ERECTILE DYSFUNCTION 07/08/2007  . 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 Benign neoplasm of colon   . Calculus of kidney   . ED (erectile dysfunction)   . IBS (irritable bowel syndrome)   . Melanoma ([place])    history of  . Other and unspecified hyperlipidemia   . Other chronic nonalcoholic liver disease   . Unspecified essential hypertension   .  Unspecified vitamin D deficiency     Past Surgical History:  Procedure Laterality Date  . AMPUTATION     distal phalanx of right thumb- traumatic  . CHOLECYSTECTOMY  10/03/09   lap  . LAMINECTOMY  1995   L 3-T11 Harrington rods in back  . LAPAROSCOPIC APPENDECTOMY N/A 05/16/2016   Procedure: APPENDECTOMY LAPAROSCOPIC;  Surgeon: Greer Pickerel, MD;  Location: [facility];  Service: General;  Laterality: N/A;  . [place]   right upper abd, Dr. Ronnald Ramp  . ORIF ANKLE FRACTURE  1967    Social History   Socioeconomic History  . Marital status: Married    Spouse name: Not on file  . Number of children: 2  . Years of education: Not on file  . Highest education level: Not on file  Occupational History  . Occupation: retired    Comment: retired from [employer] 04/2002  Social Needs  . Financial resource strain: Not on file  . Food insecurity    Worry: Not on file    Inability: Not on file  . Transportation needs    Medical: Not on file    Non-medical: Not on file  Tobacco Use  . Smoking status: Never Smoker  . Smokeless tobacco: Never Used  Substance and Sexual Activity  . Alcohol use: No  . Drug use: No  . Sexual activity: Yes    Partners: Female  Lifestyle  . Physical activity  Days per week: Not on file    Minutes per session: Not on file  . Stress: Not on file  Relationships  . Social Herbalist on phone: Not on file    Gets together: Not on file    Attends religious service: Not on file    Active member of club or organization: Not on file    Attends meetings of clubs or organizations: Not on file    Relationship status: Not on file  . Intimate partner violence    Fear of current or ex partner: Not on file    Emotionally abused: Not on file    Physically abused: Not on file    Forced sexual activity: Not on file  Other Topics Concern  . Not on file  Social History Narrative  . Not on file    Family History  Problem Relation Age of  Onset  . Heart disease Mother        CHF  . Hypertension Mother   . Diabetes Mother   . Stroke Mother   . Hypertension Father   . Heart disease Father        CHF  . Hypertension Sister   . Hypertension Sister   . Depression Neg Hx   . Alcohol abuse Neg Hx   . Drug abuse Neg Hx     Allergies  Allergen Reactions  . Hydrocodone     pts states made him sick.  . Pneumococcal Vaccine     REACTION: significant arm swelling, redness    Medication list reviewed and updated in full in [place].  GEN: No fevers, chills. Nontoxic. Primarily MSK c/o today. MSK: Detailed in the HPI GI: tolerating PO intake without difficulty Neuro: No numbness, parasthesias, or tingling associated. Otherwise the pertinent positives of the ROS are noted above.   Objective:   BP 138/62   Pulse 72   Temp 98.4 F (36.9 C) (Temporal)   Ht 5\' 9"  (1.753 m)   Wt 170 lb 8 oz (77.3 kg)   SpO2 96%   BMI 25.18 kg/m    GEN: WDWN, NAD, Non-toxic, Alert & Oriented x 3 HEENT: Atraumatic, Normocephalic.  Ears and Nose: No external deformity. EXTR: No clubbing/cyanosis/edema NEURO: Normal gait.  PSYCH: Normally interactive. Conversant. Not depressed or anxious appearing.  Calm demeanor.    Right shoulder: He does have some global loss of motion approximately 10 to 15% and flexion and abduction with external range of motion and internal range of motion globally preserved.  His strength appears to be intact 5/5 in all directions.  He does have some notable crepitus with motion.  Nontender on his Michel Bickers as well as his Neer testing.  Radiology:   Assessment and Plan:     ICD-10-CM   1. Arthritis of right glenohumeral joint  M19.011   2. Need for prophylactic vaccination and inoculation against influenza  Z23 Flu Vaccine QUAD 6+ mos PF IM (Fluarix Quad PF)  3. Left shoulder pain, unspecified chronicity  M25.512 methylPREDNISolone acetate (DEPO-MEDROL) injection 80 mg   Continue with  conservative management I do an intra-articular injection.  Note that the documentation is incorrect and the patient's symptoms are entirely on the right side.  I will attempt to get this corrected through the [facility] health and IT department.  His symptoms are right sided glenohumeral arthritis.  Intraarticular Shoulder Aspiration/Injection Procedure Note KHYREE CATTON 22-Apr-1941 Date of procedure: 08/10/2019  Procedure: Large Joint Aspiration / Injection  of Shoulder, Intraarticular, R Indications: Pain  Procedure Details Verbal consent was obtained from the patient. Risks including infection explained and contrasted with benefits and alternatives. Patient prepped with Chloraprep and Ethyl Chloride used for anesthesia. An intraarticular shoulder injection was performed using the posterior approach; needle placed into joint capsule without difficulty. The patient tolerated the procedure well and had decreased pain post injection. No complications. Injection: 8 cc of Lidocaine 1% and 2 mL Depo-Medrol 40 mg. Needle: 21 gauge, 2 inch   Follow-up: No follow-ups on file.  Meds ordered this encounter  Medications  . methylPREDNISolone acetate (DEPO-MEDROL) injection 80 mg   Orders Placed This Encounter  Procedures  . Flu Vaccine QUAD 6+ mos PF IM (Fluarix Quad PF)    Signed,  Catlin Aycock T. Reiley Keisler, MD   Outpatient Encounter Medications as of 08/10/2019  Medication Sig  . amLODipine (NORVASC) 10 MG tablet Take 1 tablet (10 mg total) by mouth daily.  Marland Kitchen aspirin 325 MG tablet Take 325 mg by mouth daily.  . Calcium Carb-Cholecalciferol (CALCIUM+D3 PO) Take 1 tablet by mouth every morning.  . fluticasone (FLONASE) 50 MCG/ACT nasal spray USE 2 SPRAY(S) IN EACH NOSTRIL ONCE DAILY AS NEEDED  . hydrochlorothiazide (HYDRODIURIL) 25 MG tablet Take 1 tablet (25 mg total) by mouth daily.  . Multiple Vitamins-Minerals (CENTRUM SILVER ULTRA MENS) TABS Take 1 tablet by mouth daily.  . sildenafil  (REVATIO) 20 MG tablet Generic Revatio / Sildanefil 20 mg. 2 - 5 tabs 30 mins prior to intercourse.  . simvastatin (ZOCOR) 40 MG tablet TAKE 1 TABLET BY MOUTH ONCE DAILY IN THE EVENING  . [EXPIRED] methylPREDNISolone acetate (DEPO-MEDROL) injection 80 mg    No facility-administered encounter medications on file as of 08/10/2019.

## 2019-08-11 NOTE — Progress Notes (Signed)
Documentation corrected and changed to right shoulder.

## 2019-09-02 ENCOUNTER — Other Ambulatory Visit: Payer: Self-pay | Admitting: Family Medicine

## 2019-09-09 DIAGNOSIS — C44619 Basal cell carcinoma of skin of left upper limb, including shoulder: Secondary | ICD-10-CM | POA: Diagnosis not present

## 2019-09-09 DIAGNOSIS — C44319 Basal cell carcinoma of skin of other parts of face: Secondary | ICD-10-CM | POA: Diagnosis not present

## 2019-09-09 DIAGNOSIS — L570 Actinic keratosis: Secondary | ICD-10-CM | POA: Diagnosis not present

## 2019-09-09 DIAGNOSIS — C44519 Basal cell carcinoma of skin of other part of trunk: Secondary | ICD-10-CM | POA: Diagnosis not present

## 2019-09-18 ENCOUNTER — Other Ambulatory Visit: Payer: Self-pay | Admitting: Family Medicine

## 2019-10-19 ENCOUNTER — Other Ambulatory Visit: Payer: Self-pay | Admitting: Family Medicine

## 2019-10-19 DIAGNOSIS — I10 Essential (primary) hypertension: Secondary | ICD-10-CM

## 2019-11-22 ENCOUNTER — Other Ambulatory Visit: Payer: Self-pay | Admitting: Family Medicine

## 2019-11-23 ENCOUNTER — Ambulatory Visit (INDEPENDENT_AMBULATORY_CARE_PROVIDER_SITE_OTHER): Payer: Medicare Other

## 2019-11-23 ENCOUNTER — Other Ambulatory Visit: Payer: Self-pay

## 2019-11-23 ENCOUNTER — Other Ambulatory Visit (INDEPENDENT_AMBULATORY_CARE_PROVIDER_SITE_OTHER): Payer: Medicare Other

## 2019-11-23 ENCOUNTER — Ambulatory Visit: Payer: Medicare Other

## 2019-11-23 ENCOUNTER — Other Ambulatory Visit: Payer: Self-pay | Admitting: Family Medicine

## 2019-11-23 DIAGNOSIS — Z0000 Encounter for general adult medical examination without abnormal findings: Secondary | ICD-10-CM | POA: Diagnosis not present

## 2019-11-23 DIAGNOSIS — E785 Hyperlipidemia, unspecified: Secondary | ICD-10-CM | POA: Diagnosis not present

## 2019-11-23 DIAGNOSIS — Z79899 Other long term (current) drug therapy: Secondary | ICD-10-CM

## 2019-11-23 LAB — CBC WITH DIFFERENTIAL/PLATELET
Basophils Absolute: 0.1 10*3/uL (ref 0.0–0.1)
Basophils Relative: 0.8 % (ref 0.0–3.0)
Eosinophils Absolute: 0.3 10*3/uL (ref 0.0–0.7)
Eosinophils Relative: 4 % (ref 0.0–5.0)
HCT: 43.5 % (ref 39.0–52.0)
Hemoglobin: 14.7 g/dL (ref 13.0–17.0)
Lymphocytes Relative: 26.1 % (ref 12.0–46.0)
Lymphs Abs: 2 10*3/uL (ref 0.7–4.0)
MCHC: 33.9 g/dL (ref 30.0–36.0)
MCV: 96 fl (ref 78.0–100.0)
Monocytes Absolute: 0.5 10*3/uL (ref 0.1–1.0)
Monocytes Relative: 6.7 % (ref 3.0–12.0)
Neutro Abs: 4.8 10*3/uL (ref 1.4–7.7)
Neutrophils Relative %: 62.4 % (ref 43.0–77.0)
Platelets: 197 10*3/uL (ref 150.0–400.0)
RBC: 4.53 Mil/uL (ref 4.22–5.81)
RDW: 13 % (ref 11.5–15.5)
WBC: 7.7 10*3/uL (ref 4.0–10.5)

## 2019-11-23 LAB — HEPATIC FUNCTION PANEL
ALT: 15 U/L (ref 0–53)
AST: 17 U/L (ref 0–37)
Albumin: 4.3 g/dL (ref 3.5–5.2)
Alkaline Phosphatase: 41 U/L (ref 39–117)
Bilirubin, Direct: 0.1 mg/dL (ref 0.0–0.3)
Total Bilirubin: 0.5 mg/dL (ref 0.2–1.2)
Total Protein: 6.4 g/dL (ref 6.0–8.3)

## 2019-11-23 LAB — LIPID PANEL
Cholesterol: 144 mg/dL (ref 0–200)
HDL: 41 mg/dL (ref >39.00)
LDL Cholesterol: 82 mg/dL (ref 0–99)
NonHDL: 102.63
Total CHOL/HDL Ratio: 4
Triglycerides: 105 mg/dL (ref 0.0–149.0)
VLDL: 21 mg/dL (ref 0.0–40.0)

## 2019-11-23 LAB — BASIC METABOLIC PANEL
BUN: 19 mg/dL (ref 6–23)
CO2: 30 mEq/L (ref 19–32)
Calcium: 9.4 mg/dL (ref 8.4–10.5)
Chloride: 105 mEq/L (ref 96–112)
Creatinine, Ser: 1.18 mg/dL (ref 0.40–1.50)
GFR: 59.77 mL/min — ABNORMAL LOW (ref >60.00)
Glucose, Bld: 91 mg/dL (ref 70–99)
Potassium: 3.8 mEq/L (ref 3.5–5.1)
Sodium: 142 mEq/L (ref 135–145)

## 2019-11-23 NOTE — Patient Instructions (Signed)
Eddie Fox , Thank you for taking time to come for your Medicare Wellness Visit. I appreciate your ongoing commitment to your health goals. Please review the following plan we discussed and let me know if I can assist you in the future.   Screening recommendations/referrals: Colonoscopy: Cologuard completed 01/23/2018 Recommended yearly ophthalmology/optometry visit for glaucoma screening and checkup Recommended yearly dental visit for hygiene and checkup  Vaccinations: Influenza vaccine: Up to date, completed 08/10/2019 Pneumococcal vaccine: Completed series Tdap vaccine: Up to date, completed 12/22/2009 Shingles vaccine: decline    Advanced directives: Advance directive discussed with you today. Even though you declined this today please call our office should you change your mind and we can give you the proper paperwork for you to fill out.  Conditions/risks identified: none  Next appointment: 11/30/2019 @ 11:20 am   Preventive Care 65 Years and Older, Male Preventive care refers to lifestyle choices and visits with your health care provider that can promote health and wellness. What does preventive care include?  A yearly physical exam. This is also called an annual well check.  Dental exams once or twice a year.  Routine eye exams. Ask your health care provider how often you should have your eyes checked.  Personal lifestyle choices, including:  Daily care of your teeth and gums.  Regular physical activity.  Eating a healthy diet.  Avoiding tobacco and drug use.  Limiting alcohol use.  Practicing safe sex.  Taking low doses of aspirin every day.  Taking vitamin and mineral supplements as recommended by your health care provider. What happens during an annual well check? The services and screenings done by your health care provider during your annual well check will depend on your age, overall health, lifestyle risk factors, and family history of disease. Counseling   Your health care provider may ask you questions about your:  Alcohol use.  Tobacco use.  Drug use.  Emotional well-being.  Home and relationship well-being.  Sexual activity.  Eating habits.  History of falls.  Memory and ability to understand (cognition).  Work and work Statistician. Screening  You may have the following tests or measurements:  Height, weight, and BMI.  Blood pressure.  Lipid and cholesterol levels. These may be checked every 5 years, or more frequently if you are over 84 years old.  Skin check.  Lung cancer screening. You may have this screening every year starting at age 4 if you have a 30-pack-year history of smoking and currently smoke or have quit within the past 15 years.  Fecal occult blood test (FOBT) of the stool. You may have this test every year starting at age 14.  Flexible sigmoidoscopy or colonoscopy. You may have a sigmoidoscopy every 5 years or a colonoscopy every 10 years starting at age 32.  Prostate cancer screening. Recommendations will vary depending on your family history and other risks.  Hepatitis C blood test.  Hepatitis B blood test.  Sexually transmitted disease (STD) testing.  Diabetes screening. This is done by checking your blood sugar (glucose) after you have not eaten for a while (fasting). You may have this done every 1-3 years.  Abdominal aortic aneurysm (AAA) screening. You may need this if you are a current or former smoker.  Osteoporosis. You may be screened starting at age 15 if you are at high risk. Talk with your health care provider about your test results, treatment options, and if necessary, the need for more tests. Vaccines  Your health care provider may recommend  certain vaccines, such as:  Influenza vaccine. This is recommended every year.  Tetanus, diphtheria, and acellular pertussis (Tdap, Td) vaccine. You may need a Td booster every 10 years.  Zoster vaccine. You may need this after age 25.   Pneumococcal 13-valent conjugate (PCV13) vaccine. One dose is recommended after age 81.  Pneumococcal polysaccharide (PPSV23) vaccine. One dose is recommended after age 62. Talk to your health care provider about which screenings and vaccines you need and how often you need them. This information is not intended to replace advice given to you by your health care provider. Make sure you discuss any questions you have with your health care provider. Document Released: 12/30/2015 Document Revised: 08/22/2016 Document Reviewed: 10/04/2015 Elsevier Interactive Patient Education  2017 [place] Prevention in the Home Falls can cause injuries. They can happen to people of all ages. There are many things you can do to make your home safe and to help prevent falls. What can I do on the outside of my home?  Regularly fix the edges of walkways and driveways and fix any cracks.  Remove anything that might make you trip as you walk through a door, such as a raised step or threshold.  Trim any bushes or trees on the path to your home.  Use bright outdoor lighting.  Clear any walking paths of anything that might make someone trip, such as rocks or tools.  Regularly check to see if handrails are loose or broken. Make sure that both sides of any steps have handrails.  Any raised decks and porches should have guardrails on the edges.  Have any leaves, snow, or ice cleared regularly.  Use sand or salt on walking paths during winter.  Clean up any spills in your garage right away. This includes oil or grease spills. What can I do in the bathroom?  Use night lights.  Install grab bars by the toilet and in the tub and shower. Do not use towel bars as grab bars.  Use non-skid mats or decals in the tub or shower.  If you need to sit down in the shower, use a plastic, non-slip stool.  Keep the floor dry. Clean up any water that spills on the floor as soon as it happens.  Remove soap  buildup in the tub or shower regularly.  Attach bath mats securely with double-sided non-slip rug tape.  Do not have throw rugs and other things on the floor that can make you trip. What can I do in the bedroom?  Use night lights.  Make sure that you have a light by your bed that is easy to reach.  Do not use any sheets or blankets that are too big for your bed. They should not hang down onto the floor.  Have a firm chair that has side arms. You can use this for support while you get dressed.  Do not have throw rugs and other things on the floor that can make you trip. What can I do in the kitchen?  Clean up any spills right away.  Avoid walking on wet floors.  Keep items that you use a lot in easy-to-reach places.  If you need to reach something above you, use a strong step stool that has a grab bar.  Keep electrical cords out of the way.  Do not use floor polish or wax that makes floors slippery. If you must use wax, use non-skid floor wax.  Do not have throw rugs and other  things on the floor that can make you trip. What can I do with my stairs?  Do not leave any items on the stairs.  Make sure that there are handrails on both sides of the stairs and use them. Fix handrails that are broken or loose. Make sure that handrails are as long as the stairways.  Check any carpeting to make sure that it is firmly attached to the stairs. Fix any carpet that is loose or worn.  Avoid having throw rugs at the top or bottom of the stairs. If you do have throw rugs, attach them to the floor with carpet tape.  Make sure that you have a light switch at the top of the stairs and the bottom of the stairs. If you do not have them, ask someone to add them for you. What else can I do to help prevent falls?  Wear shoes that:  Do not have high heels.  Have rubber bottoms.  Are comfortable and fit you well.  Are closed at the toe. Do not wear sandals.  If you use a stepladder:  Make  sure that it is fully opened. Do not climb a closed stepladder.  Make sure that both sides of the stepladder are locked into place.  Ask someone to hold it for you, if possible.  Clearly mark and make sure that you can see:  Any grab bars or handrails.  First and last steps.  Where the edge of each step is.  Use tools that help you move around (mobility aids) if they are needed. These include:  Canes.  Walkers.  Scooters.  Crutches.  Turn on the lights when you go into a dark area. Replace any light bulbs as soon as they burn out.  Set up your furniture so you have a clear path. Avoid moving your furniture around.  If any of your floors are uneven, fix them.  If there are any pets around you, be aware of where they are.  Review your medicines with your doctor. Some medicines can make you feel dizzy. This can increase your chance of falling. Ask your doctor what other things that you can do to help prevent falls. This information is not intended to replace advice given to you by your health care provider. Make sure you discuss any questions you have with your health care provider. Document Released: 09/29/2009 Document Revised: 05/10/2016 Document Reviewed: 01/07/2015 Elsevier Interactive Patient Education  2017 [employer].

## 2019-11-23 NOTE — Progress Notes (Signed)
PCP notes:  Health Maintenance: none   Abnormal Screenings: none   Patient concerns: none   Nurse concerns: none   Next PCP appt.: 11/30/2019 @ 11:20 am

## 2019-11-23 NOTE — Progress Notes (Signed)
Subjective:   Eddie Fox is a 78 y.o. male who presents for Medicare Annual/Subsequent preventive examination.  Review of Systems: N/A   This visit is being conducted through telemedicine via telephone at the nurse health advisor's home address due to the COVID-19 pandemic. This patient has given me verbal consent via doximity to conduct this visit, patient states they are participating from their home address. Patient and myself are on the telephone call. There is no referral for this visit. Some vital signs may be absent or patient reported.    Patient identification: identified by name, DOB, and current address   Cardiac Risk Factors include: male gender;advanced age (>3men, >52 women)     Objective:    Vitals: There were no vitals taken for this visit.  There is no height or weight on file to calculate BMI.  Advanced Directives 11/23/2019 11/20/2018 04/08/2018 04/09/2017 11/07/2016 06/14/2016 05/15/2016  Does Patient Have a Medical Advance Directive? No No No No No No No  Would patient like information on creating a medical advance directive? No - Patient declined No - Patient declined No - Patient declined - (No Data) No - patient declined information No - patient declined information    Tobacco Social History   Tobacco Use  Smoking Status Never Smoker  Smokeless Tobacco Never Used     Counseling given: Not Answered   Clinical Intake:  Pre-visit preparation completed: Yes  Pain : No/denies pain     Nutritional Risks: None Diabetes: No  How often do you need to have someone help you when you read instructions, pamphlets, or other written materials from your doctor or pharmacy?: 1 - Never What is the last grade level you completed in school?: 11th  Interpreter Needed?: No  Information entered by :: Cjohnson, LPN  Past Medical History:  Diagnosis Date  . Benign neoplasm of colon   . Calculus of kidney   . ED (erectile dysfunction)   . IBS (irritable bowel  syndrome)   . Melanoma ([place])    history of  . Other and unspecified hyperlipidemia   . Other chronic nonalcoholic liver disease   . Unspecified essential hypertension   . Unspecified vitamin D deficiency    Past Surgical History:  Procedure Laterality Date  . AMPUTATION     distal phalanx of right thumb- traumatic  . CHOLECYSTECTOMY  10/03/09   lap  . LAMINECTOMY  1995   L 3-T11 Harrington rods in back  . LAPAROSCOPIC APPENDECTOMY N/A 05/16/2016   Procedure: APPENDECTOMY LAPAROSCOPIC;  Surgeon: Greer Pickerel, MD;  Location: [facility];  Service: General;  Laterality: N/A;  . [place]   right upper abd, Dr. Ronnald Ramp  . ORIF ANKLE FRACTURE  1967   Family History  Problem Relation Age of Onset  . Heart disease Mother        CHF  . Hypertension Mother   . Diabetes Mother   . Stroke Mother   . Hypertension Father   . Heart disease Father        CHF  . Hypertension Sister   . Hypertension Sister   . Depression Neg Hx   . Alcohol abuse Neg Hx   . Drug abuse Neg Hx    Social History   Socioeconomic History  . Marital status: Married    Spouse name: Not on file  . Number of children: 2  . Years of education: Not on file  . Highest education level: Not on file  Occupational History  .  Occupation: retired    Comment: retired from [employer] 04/2002  Social Needs  . Financial resource strain: Not hard at all  . Food insecurity    Worry: Never true    Inability: Never true  . Transportation needs    Medical: No    Non-medical: No  Tobacco Use  . Smoking status: Never Smoker  . Smokeless tobacco: Never Used  Substance and Sexual Activity  . Alcohol use: No  . Drug use: No  . Sexual activity: Yes    Partners: Female  Lifestyle  . Physical activity    Days per week: 0 days    Minutes per session: 0 min  . Stress: Not at all  Relationships  . Social Herbalist on phone: Not on file    Gets together: Not on file    Attends religious service:  Not on file    Active member of club or organization: Not on file    Attends meetings of clubs or organizations: Not on file    Relationship status: Not on file  Other Topics Concern  . Not on file  Social History Narrative  . Not on file    Outpatient Encounter Medications as of 11/23/2019  Medication Sig  . amLODipine (NORVASC) 10 MG tablet TAKE 1 TABLET BY MOUTH  DAILY  . aspirin 325 MG tablet Take 325 mg by mouth daily.  . Calcium Carb-Cholecalciferol (CALCIUM+D3 PO) Take 1 tablet by mouth every morning.  . fluticasone (FLONASE) 50 MCG/ACT nasal spray USE 2 SPRAY(S) IN EACH NOSTRIL ONCE DAILY AS NEEDED  . hydrochlorothiazide (HYDRODIURIL) 25 MG tablet TAKE 1 TABLET BY MOUTH  DAILY  . Multiple Vitamins-Minerals (CENTRUM SILVER ULTRA MENS) TABS Take 1 tablet by mouth daily.  . sildenafil (REVATIO) 20 MG tablet Generic Revatio / Sildanefil 20 mg. 2 - 5 tabs 30 mins prior to intercourse.  . simvastatin (ZOCOR) 40 MG tablet TAKE 1 TABLET BY MOUTH ONCE DAILY IN THE EVENING   No facility-administered encounter medications on file as of 11/23/2019.     Activities of Daily Living In your present state of health, do you have any difficulty performing the following activities: 11/23/2019  Hearing? N  Vision? N  Difficulty concentrating or making decisions? N  Walking or climbing stairs? N  Dressing or bathing? N  Doing errands, shopping? N  Preparing Food and eating ? N  Using the Toilet? N  In the past six months, have you accidently leaked urine? N  Do you have problems with loss of bowel control? N  Managing your Medications? N  Managing your Finances? N  Housekeeping or managing your Housekeeping? N  Some recent data might be hidden    Patient Care Team: Owens Loffler, MD as PCP - General Danella Sensing, MD as Consulting Physician (Dermatology) Katy Apo, MD as Consulting Physician (Ophthalmology)   Assessment:   This is a routine wellness examination for Eddie Fox.   Exercise Activities and Dietary recommendations Current Exercise Habits: The patient does not participate in regular exercise at present, Exercise limited by: None identified  Goals    . Patient Stated     Starting 11/20/2018, I will continue to take medications as prescribed.     . Patient Stated     11/23/2019, I will maintain and continue medications as prescribed.        Fall Risk Fall Risk  11/23/2019 11/20/2018 11/20/2017 11/07/2016 06/13/2015  Falls in the past year? 1 1 No No No  Comment tripped on vine 2 falls while walking in woods - - -  Number falls in past yr: 1 1 - - -  Injury with Fall? 0 0 - - -  Risk for fall due to : Medication side effect - - - -  Follow up Falls evaluation completed;Falls prevention discussed - - - -   Is the patient's home free of loose throw rugs in walkways, pet beds, electrical cords, etc?   yes      Grab bars in the bathroom? no      Handrails on the stairs?   yes      Adequate lighting?   yes  Timed Get Up and Go Performed: N/A  Depression Screen PHQ 2/9 Scores 11/23/2019 11/20/2018 11/20/2017 11/07/2016  PHQ - 2 Score 0 0 0 0  PHQ- 9 Score 0 0 - -    Cognitive Function MMSE - Mini Mental State Exam 11/23/2019 11/20/2018 11/07/2016  Orientation to time 5 5 5   Orientation to Place 5 5 5   Registration 3 3 3   Attention/ Calculation 5 0 0  Recall 3 3 3   Language- name 2 objects - 0 0  Language- repeat 1 1 1   Language- follow 3 step command - 3 3  Language- read & follow direction - 0 0  Write a sentence - 0 0  Copy design - 0 0  Total score - 20 20  Mini Cog  Mini-Cog screen was completed. Maximum score is 22. A value of 0 denotes this part of the MMSE was not completed or the patient failed this part of the Mini-Cog screening.       Immunization History  Administered Date(s) Administered  . Influenza Split 12/25/2011  . Influenza,inj,Quad PF,6+ Mos 08/19/2013, 08/26/2014, 09/14/2015, 09/11/2016, 08/28/2017, 08/27/2018,  08/10/2019  . Pneumococcal Conjugate-13 02/16/2015  . Td 03/24/2002, 12/22/2009    Qualifies for Shingles Vaccine? decline  Screening Tests Health Maintenance  Topic Date Due  . TETANUS/TDAP  12/23/2019  . INFLUENZA VACCINE  Completed   Cancer Screenings: Lung: Low Dose CT Chest recommended if Age 38-80 years, 30 pack-year currently smoking OR have quit w/in 15years. Patient does not qualify. Colorectal: Cologuard completed 01/23/2018  Additional Screenings:  Hepatitis C Screening: N/A      Plan:    Patient will maintain and continue medications as prescribed.  I have personally reviewed and noted the following in the patient's chart:   . Medical and social history . Use of alcohol, tobacco or illicit drugs  . Current medications and supplements . Functional ability and status . Nutritional status . Physical activity . Advanced directives . List of other physicians . Hospitalizations, surgeries, and ER visits in previous 12 months . Vitals . Screenings to include cognitive, depression, and falls . Referrals and appointments  In addition, I have reviewed and discussed with patient certain preventive protocols, quality metrics, and best practice recommendations. A written personalized care plan for preventive services as well as general preventive health recommendations were provided to patient.     Andrez Grime, LPN  [id number]

## 2019-11-30 ENCOUNTER — Encounter: Payer: Medicare Other | Admitting: Family Medicine

## 2019-12-03 ENCOUNTER — Other Ambulatory Visit: Payer: Self-pay

## 2019-12-03 ENCOUNTER — Ambulatory Visit (INDEPENDENT_AMBULATORY_CARE_PROVIDER_SITE_OTHER): Payer: Medicare Other | Admitting: Family Medicine

## 2019-12-03 ENCOUNTER — Encounter: Payer: Self-pay | Admitting: Family Medicine

## 2019-12-03 VITALS — BP 130/72 | HR 72 | Temp 97.9°F | Ht 68.5 in | Wt 167.8 lb

## 2019-12-03 DIAGNOSIS — I10 Essential (primary) hypertension: Secondary | ICD-10-CM | POA: Diagnosis not present

## 2019-12-03 DIAGNOSIS — E559 Vitamin D deficiency, unspecified: Secondary | ICD-10-CM

## 2019-12-03 DIAGNOSIS — C181 Malignant neoplasm of appendix: Secondary | ICD-10-CM

## 2019-12-03 DIAGNOSIS — E7800 Pure hypercholesterolemia, unspecified: Secondary | ICD-10-CM

## 2019-12-03 MED ORDER — FLUTICASONE PROPIONATE 50 MCG/ACT NA SUSP: NASAL | 3 refills | Status: DC

## 2019-12-03 NOTE — Progress Notes (Signed)
Avalene Sealy T. Ayline Dingus, MD Primary Care and [place] at [facility] [place], [address] Phone: [phone]  FAX: [phone]  Eddie Fox - 78 y.o. male  MRN [id number]  Date of Birth: [birth year]  Visit Date: 12/03/2019  PCP: Owens Loffler, MD  Referred by: Owens Loffler, MD  Chief Complaint  Patient presents with  . Annual Exam    Part 2    This visit occurred during the SARS-CoV-2 public health emergency.  Safety protocols were in place, including screening questions prior to the visit, additional usage of staff PPE, and extensive cleaning of exam room while observing appropriate contact time as indicated for disinfecting solutions.   Subjective:   Eddie Fox is a 78 y.o. very pleasant male patient who presents with the following:  F/u multiple medical problems and review all labs.  He is a well-known patient he is here after his welcome to Medicare visit last week.  Globally he is doing well.  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  He also has a history of melanoma.  Felt funny inside and later on that day and thought his BP, [id number] systolic and took another BP medicine and down [id number] sytolic Recheck BP - [id number]  HTN: Tolerating all medications without side effects Stable and at goal No CP, no sob. No HA.  BP Readings from Last 3 Encounters:  12/03/19 130/72  08/10/19 138/62  05/25/19 (!) [id number]    Basic Metabolic Panel:    Component Value Date/Time   NA 142 11/23/2019 1151   NA 145 04/08/2017 1015   K 3.8 11/23/2019 1151   K 3.2 (L) 04/08/2017 1015   CL 105 11/23/2019 1151   CO2 30 11/23/2019 1151   CO2 29 04/08/2017 1015   BUN 19 11/23/2019 1151   BUN 17.8 04/08/2017 1015   CREATININE 1.18 11/23/2019 1151   CREATININE 1.1 04/08/2017 1015   GLUCOSE 91 11/23/2019 1151   GLUCOSE 87 04/08/2017 1015   CALCIUM 9.4 11/23/2019 1151   CALCIUM  9.3 04/08/2017 1015     Known history of vitamin D deficiency.  Hyperlipidemia. Lipids: Doing well, stable. Tolerating meds fine with no SE. Panel reviewed with patient.  Lipids:    Component Value Date/Time   CHOL 144 11/23/2019 1151   TRIG 105.0 11/23/2019 1151   HDL 41.00 11/23/2019 1151   LDLDIRECT 171.9 07/08/2007 1044   VLDL 21.0 11/23/2019 1151   CHOLHDL 4 11/23/2019 1151    Lab Results  Component Value Date   ALT 15 11/23/2019   AST 17 11/23/2019   ALKPHOS 41 11/23/2019   BILITOT 0.5 11/23/2019     Past Medical History, Surgical History, Social History, Family History, Problem List, Medications, and Allergies have been reviewed and updated if relevant.  Patient Active Problem List   Diagnosis Date Noted  . 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 CHOLECYSTECTOMY, LAPAROSCOPIC, HX OF 12/28/2010  . VITAMIN D DEFICIENCY 01/30/2010  . FATTY LIVER DISEASE 08/12/2009  . Essential hypertension 09/14/2008  . COLONIC POLYPS 07/08/2007  . Hyperlipidemia 07/08/2007  . ERECTILE DYSFUNCTION 07/08/2007  . 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 Benign neoplasm of colon   . Calculus of kidney   . ED (erectile dysfunction)   . IBS (irritable bowel syndrome)   . Melanoma ([facility])  history of  . Other and unspecified hyperlipidemia   . Other chronic nonalcoholic liver disease   . Unspecified essential hypertension   . Unspecified vitamin D deficiency     Past Surgical History:  Procedure Laterality Date  . AMPUTATION     distal phalanx of right thumb- traumatic  . CHOLECYSTECTOMY  10/03/09   lap  . LAMINECTOMY  1995   L 3-T11 Harrington rods in back  . LAPAROSCOPIC APPENDECTOMY N/A 05/16/2016   Procedure: APPENDECTOMY LAPAROSCOPIC;  Surgeon: Greer Pickerel, MD;  Location: [facility];  Service: General;  Laterality: N/A;  . [place]   right upper abd, Dr. Ronnald Ramp  . ORIF  ANKLE FRACTURE  1967    Social History   Socioeconomic History  . Marital status: Married    Spouse name: Not on file  . Number of children: 2  . Years of education: Not on file  . Highest education level: Not on file  Occupational History  . Occupation: retired    Comment: retired from [employer] 04/2002  Tobacco Use  . Smoking status: Never Smoker  . Smokeless tobacco: Never Used  Substance and Sexual Activity  . Alcohol use: No  . Drug use: No  . Sexual activity: Yes    Partners: Female  Other Topics Concern  . Not on file  Social History Narrative  . Not on file   Social Determinants of Health   Financial Resource Strain: Low Risk   . Difficulty of Paying Living Expenses: Not hard at all  Food Insecurity: No Food Insecurity  . Worried About Charity fundraiser in the Last Year: Never true  . Ran Out of Food in the Last Year: Never true  Transportation Needs: No Transportation Needs  . Lack of Transportation (Medical): No  . Lack of Transportation (Non-Medical): No  Physical Activity: Inactive  . Days of Exercise per Week: 0 days  . Minutes of Exercise per Session: 0 min  Stress: No Stress Concern Present  . Feeling of Stress : Not at all  Social Connections:   . Frequency of Communication with Friends and Family: Not on file  . Frequency of Social Gatherings with Friends and Family: Not on file  . Attends Religious Services: Not on file  . Active Member of Clubs or Organizations: Not on file  . Attends Archivist Meetings: Not on file  . Marital Status: Not on file  Intimate Partner Violence: Not At Risk  . Fear of Current or Ex-Partner: No  . Emotionally Abused: No  . Physically Abused: No  . Sexually Abused: No    Family History  Problem Relation Age of Onset  . Heart disease Mother        CHF  . Hypertension Mother   . Diabetes Mother   . Stroke Mother   . Hypertension Father   . Heart disease Father        CHF  . Hypertension  Sister   . Hypertension Sister   . Depression Neg Hx   . Alcohol abuse Neg Hx   . Drug abuse Neg Hx     Allergies  Allergen Reactions  . Hydrocodone     pts states made him sick.  . Pneumococcal Vaccine     REACTION: significant arm swelling, redness    Medication list reviewed and updated in full in [place].   GEN: No acute illnesses, no fevers, chills. GI: No n/v/d, eating normally Pulm: No SOB Interactive  and getting along well at home.  Otherwise, ROS is as per the HPI.  Objective:   BP 130/72   Pulse 72   Temp 97.9 F (36.6 C) (Temporal)   Ht 5' 8.5" (1.74 m)   Wt 167 lb 12 oz (76.1 kg)   SpO2 97%   BMI 25.14 kg/m   GEN: WDWN, NAD, Non-toxic, A & O x 3 HEENT: Atraumatic, Normocephalic. Neck supple. No masses, No LAD. Ears and Nose: No external deformity. CV: RRR, No M/G/R. No JVD. No thrill. No extra heart sounds. PULM: CTA B, no wheezes, crackles, rhonchi. No retractions. No resp. distress. No accessory muscle use. EXTR: No c/c/e NEURO Normal gait.  PSYCH: Normally interactive. Conversant. Not depressed or anxious appearing.  Calm demeanor.   Laboratory and Imaging Data: Results for orders placed or performed in visit on [id number]  Basic metabolic panel  Result Value Ref Range   Sodium 142 135 - 145 mEq/L   Potassium 3.8 3.5 - 5.1 mEq/L   Chloride 105 96 - 112 mEq/L   CO2 30 19 - 32 mEq/L   Glucose, Bld 91 70 - 99 mg/dL   BUN 19 6 - 23 mg/dL   Creatinine, Ser 1.18 0.40 - 1.50 mg/dL   GFR 59.77 (L) >60.00 mL/min   Calcium 9.4 8.4 - 10.5 mg/dL  Liver Profile  Result Value Ref Range   Total Bilirubin 0.5 0.2 - 1.2 mg/dL   Bilirubin, Direct 0.1 0.0 - 0.3 mg/dL   Alkaline Phosphatase 41 39 - 117 U/L   AST 17 0 - 37 U/L   ALT 15 0 - 53 U/L   Total Protein 6.4 6.0 - 8.3 g/dL   Albumin 4.3 3.5 - 5.2 g/dL  CBC with Differential/Platelet  Result Value Ref Range   WBC 7.7 4.0 - 10.5 K/uL   RBC 4.53 4.22 - 5.81 Mil/uL   Hemoglobin 14.7 13.0 -  17.0 g/dL   HCT 43.5 39.0 - 52.0 %   MCV 96.0 78.0 - 100.0 fl   MCHC 33.9 30.0 - 36.0 g/dL   RDW 13.0 11.5 - 15.5 %   Platelets 197.0 150.0 - 400.0 K/uL   Neutrophils Relative % 62.4 43.0 - 77.0 %   Lymphocytes Relative 26.1 12.0 - 46.0 %   Monocytes Relative 6.7 3.0 - 12.0 %   Eosinophils Relative 4.0 0.0 - 5.0 %   Basophils Relative 0.8 0.0 - 3.0 %   Neutro Abs 4.8 1.4 - 7.7 K/uL   Lymphs Abs 2.0 0.7 - 4.0 K/uL   Monocytes Absolute 0.5 0.1 - 1.0 K/uL   Eosinophils Absolute 0.3 0.0 - 0.7 K/uL   Basophils Absolute 0.1 0.0 - 0.1 K/uL  Lipid Profile  Result Value Ref Range   Cholesterol 144 0 - 200 mg/dL   Triglycerides 105.0 0.0 - 149.0 mg/dL   HDL 41.00 >39.00 mg/dL   VLDL 21.0 0.0 - 40.0 mg/dL   LDL Cholesterol 82 0 - 99 mg/dL   Total CHOL/HDL Ratio 4    NonHDL 102.63      Assessment and Plan:     ICD-10-CM   1. Essential hypertension  I10   2. 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 Pure hypercholesterolemia  E78.00   4. Vitamin D deficiency  E55.9    Blood pressure appears high and has had some high readings at home.  Check per below.  Continue with vitamin D and all other medications.  He will call and  about his blood pressure if it is greater than 140/80.  Patient Instructions  Get a blood cuff and check a couple of times a week.  You want it less than 140/80.    Follow-up: No follow-ups on file.  Meds ordered this encounter  Medications  . fluticasone (FLONASE) 50 MCG/ACT nasal spray    Sig: USE 2 SPRAY(S) IN EACH NOSTRIL ONCE DAILY AS NEEDED    Dispense:  48 g    Refill:  3   No orders of the defined types were placed in this encounter.   Signed,  Maud Deed. Lizmarie Witters, MD   Outpatient Encounter Medications as of 12/03/2019  Medication Sig  . amLODipine (NORVASC) 10 MG tablet TAKE 1 TABLET BY MOUTH  DAILY  . aspirin 325 MG tablet Take 325 mg by mouth daily.  . Calcium Carb-Cholecalciferol (CALCIUM+D3 PO)  Take 1 tablet by mouth every morning.  . fluticasone (FLONASE) 50 MCG/ACT nasal spray USE 2 SPRAY(S) IN EACH NOSTRIL ONCE DAILY AS NEEDED  . hydrochlorothiazide (HYDRODIURIL) 25 MG tablet TAKE 1 TABLET BY MOUTH  DAILY  . Multiple Vitamins-Minerals (CENTRUM SILVER ULTRA MENS) TABS Take 1 tablet by mouth daily.  . simvastatin (ZOCOR) 40 MG tablet TAKE 1 TABLET BY MOUTH ONCE DAILY IN THE EVENING  . [DISCONTINUED] fluticasone (FLONASE) 50 MCG/ACT nasal spray USE 2 SPRAY(S) IN EACH NOSTRIL ONCE DAILY AS NEEDED  . [DISCONTINUED] sildenafil (REVATIO) 20 MG tablet Generic Revatio / Sildanefil 20 mg. 2 - 5 tabs 30 mins prior to intercourse.   No facility-administered encounter medications on file as of 12/03/2019.

## 2019-12-03 NOTE — Patient Instructions (Signed)
Get a blood cuff and check a couple of times a week.  You want it less than 140/80.

## 2020-01-06 ENCOUNTER — Ambulatory Visit: Payer: Medicare Other | Attending: Internal Medicine

## 2020-01-06 DIAGNOSIS — Z23 Encounter for immunization: Secondary | ICD-10-CM | POA: Insufficient documentation

## 2020-01-06 NOTE — Progress Notes (Signed)
   Covid-19 Vaccination Clinic  Name:  Eddie Fox    MRN: [id number] DOB: 09/12/1941  01/06/2020  Eddie Fox was observed post Covid-19 immunization for 15 minutes without incidence. He was provided with Vaccine Information Sheet and instruction to access the V-Safe system.   Eddie Fox was instructed to call 911 with any severe reactions post vaccine: Marland Kitchen Difficulty breathing  . Swelling of your face and throat  . A fast heartbeat  . A bad rash all over your body  . Dizziness and weakness    Immunizations Administered    Name Date Dose VIS Date Route   Pfizer COVID-19 Vaccine 01/06/2020  6:08 PM 0.3 mL 11/27/2019 Intramuscular   Manufacturer: [place]   Lot: [id number]   [place]: [id number]

## 2020-01-08 ENCOUNTER — Other Ambulatory Visit: Payer: Self-pay | Admitting: Family Medicine

## 2020-01-08 DIAGNOSIS — I10 Essential (primary) hypertension: Secondary | ICD-10-CM

## 2020-01-27 ENCOUNTER — Ambulatory Visit: Payer: Medicare Other | Attending: Internal Medicine

## 2020-01-27 DIAGNOSIS — Z23 Encounter for immunization: Secondary | ICD-10-CM | POA: Insufficient documentation

## 2020-01-27 NOTE — Progress Notes (Signed)
   Covid-19 Vaccination Clinic  Name:  Eddie Fox    MRN: [id number] DOB: 1941/07/04  01/27/2020  [name] was observed post Covid-19 immunization for 15 minutes without incidence. He was provided with Vaccine Information Sheet and instruction to access the V-Safe system.   [name] was instructed to call 911 with any severe reactions post vaccine: Marland Kitchen Difficulty breathing  . Swelling of your face and throat  . A fast heartbeat  . A bad rash all over your body  . Dizziness and weakness    Immunizations Administered    Name Date Dose VIS Date Route   Pfizer COVID-19 Vaccine 01/27/2020  8:10 AM 0.3 mL 11/27/2019 Intramuscular   Manufacturer: [employer]   Lot: [id number]   [place]: [id number]

## 2020-04-05 ENCOUNTER — Telehealth: Payer: Self-pay | Admitting: Family Medicine

## 2020-04-05 NOTE — Telephone Encounter (Signed)
Patient returned your call.

## 2020-04-05 NOTE — Telephone Encounter (Signed)
Noted.  Thank you Dr Lorelei Pont for the information - I was not aware how available you were today so I was just trying to get a quick answer for the patient as he was a little anxious given his new BP range. I advised him when I spoke with him that his BP range looked good other than his Diastolic being a little low but as long he was not having any symptoms - dizziness, lightheaded, feeling "swimmy or foggy" that this was fine. Pt denied any symptoms.   I have called and LM for a call back. Per previous conversation, He will call if any symptoms arise or any further concerns. Will try again later

## 2020-04-05 NOTE — Telephone Encounter (Signed)
Pt aware of recommendations per Dr Lorelei Pont  Nothing further needed.

## 2020-04-05 NOTE — Telephone Encounter (Signed)
Pt called with concerns of his BP  He states that normally his BP is usually in the 123XX123 diastolic but recently its been dropping below 60 for about  2-3 months. HR is 55-59  Reading this AM at 830am  136/58 Left arm, HR 50  Pt is not having any symptoms - no dizziness, lightheadedness  Dr Lorelei Pont advised him last visit to watch BP, stay consistent in 140s range, keep an eye on diastolic range.  Decrease sodium intake to help with BP.   Pt just wants to ensure everything okay with BP range.   Can Dr Diona Browner take a look since Dr Lorelei Pont is not in office today? (to ease patients mind)

## 2020-04-05 NOTE — Telephone Encounter (Signed)
Eddie Fox, can you call him.  As long as he is feeling okay and does not feel lightheaded or like he is going to pass out, I think that this is okay.  If he does feel lightheaded at all or dizzy, let me know.  [name], I will always be working every day, usually multiple times a day when I am not in the office.  Even when I am out of town, I will be working every day.  If there is ever a question, please send all questions to my personal inbox.  Dr. Diona Browner and Eddie Fox will always have access.  For Dr. B and me, it is far easier to send messages or questions directly to our own inbox.  Thanks.

## 2020-05-18 ENCOUNTER — Encounter: Payer: Self-pay | Admitting: Family Medicine

## 2020-05-18 ENCOUNTER — Ambulatory Visit (INDEPENDENT_AMBULATORY_CARE_PROVIDER_SITE_OTHER): Payer: Medicare Other | Admitting: Family Medicine

## 2020-05-18 ENCOUNTER — Other Ambulatory Visit: Payer: Self-pay

## 2020-05-18 VITALS — BP 130/80 | HR 64 | Temp 98.3°F | Ht 68.5 in | Wt 171.5 lb

## 2020-05-18 DIAGNOSIS — L84 Corns and callosities: Secondary | ICD-10-CM

## 2020-05-18 DIAGNOSIS — M7671 Peroneal tendinitis, right leg: Secondary | ICD-10-CM

## 2020-05-18 NOTE — Progress Notes (Signed)
Eddie Fox T. Eddie Mcpeek, MD, [place] at [facility] [place], [address]  Phone: [phone]  FAX: [phone]  Eddie Fox - 79 y.o. male  MRN [id number]  Date of Birth: 06-Apr-1941  Date: 05/18/2020  PCP: Owens Loffler, MD  Referral: Owens Loffler, MD  Chief Complaint  Patient presents with  . Foot Pain    Right    This visit occurred during the SARS-CoV-2 public health emergency.  Safety protocols were in place, including screening questions prior to the visit, additional usage of staff PPE, and extensive cleaning of exam room while observing appropriate contact time as indicated for disinfecting solutions.   Subjective:   Eddie Fox is a 79 y.o. very pleasant male patient with Body mass index is 25.7 kg/m. who presents with the following:  R LE with pain, foot with mowing and on an incline.   He is a pleasant well-known patient he presents today with some ongoing foot pain that is been present off and on intermittently for quite some time.  He does have some forefoot pain and he has a very wide forefoot.  He is having pain at his bunion as well as his bunionette.  He does have some very significant callus formation.  He also has some mild lateral ankle tenderness at as well and some difficulty and pain with going up and down inclined turf as well as heels.  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  Review of Systems is noted in the HPI, as appropriate   Objective:   BP 130/80   Pulse 64   Temp 98.3 F (36.8 C) (Temporal)   Ht 5' 8.5" (1.74 m)   Wt 171 lb 8 oz (77.8 kg)   SpO2 95%   BMI 25.70 kg/m   GEN: No acute distress; alert,appropriate. PULM: Breathing comfortably in no respiratory distress PSYCH: Normally interactive.    On exam the patient has no significant bony tenderness in the midfoot or hindfoot.  Nontender at the tibia or  fibula.  Peroneal tendon is modestly tender to palpation.  Posterior tibialis is nontender.  The forefoot is quite wide with some significant collapse and bunion and bunionette formation.  On the plantar aspect of both the bunion and bunionette, he does have some significant callus with a corn that is developed on the plantar aspect of the bunionette.  Radiology: No results found.  Assessment and Plan:     ICD-10-CM   1. Peroneal tendinitis of right lower extremity  M76.71   2. Corn of foot  L84    Reviewed the use of corn pads as well as some salicylic acid and either a pumice stone or some similar for shaving off the corn.  Basic motion I think that the peroneal tendon will calm down once his lateral corner calms down as well.  Patient Instructions      TRY SOME VASELINE  OR YOU CAN GET SALICYCLIC ACID CREAM OVER THE COUNTER, TOO TO SOFTEN UP THE CORNS / CALLUSES    Follow-up: No follow-ups on file.  No orders of the defined types were placed in this encounter.  There are no discontinued medications. No orders of the defined types were placed in this encounter.   Signed,  Maud Deed. Noal Abshier, MD   Outpatient Encounter Medications as of 05/18/2020  Medication Sig  . amLODipine (NORVASC) 10 MG tablet TAKE 1  TABLET BY MOUTH  DAILY  . aspirin 325 MG tablet Take 325 mg by mouth daily.  . Calcium Carb-Cholecalciferol (CALCIUM+D3 PO) Take 1 tablet by mouth every morning.  . fluticasone (FLONASE) 50 MCG/ACT nasal spray USE 2 SPRAY(S) IN EACH NOSTRIL ONCE DAILY AS NEEDED  . hydrochlorothiazide (HYDRODIURIL) 25 MG tablet TAKE 1 TABLET BY MOUTH  DAILY  . Multiple Vitamins-Minerals (CENTRUM SILVER ULTRA MENS) TABS Take 1 tablet by mouth daily.  . simvastatin (ZOCOR) 40 MG tablet TAKE 1 TABLET BY MOUTH ONCE DAILY IN THE EVENING   No facility-administered encounter medications on file as of 05/18/2020.

## 2020-05-18 NOTE — Patient Instructions (Addendum)
     TRY SOME VASELINE  OR YOU CAN GET SALICYCLIC ACID CREAM OVER THE COUNTER, TOO TO SOFTEN UP THE CORNS / CALLUSES

## 2020-05-24 ENCOUNTER — Inpatient Hospital Stay: Payer: Medicare Other | Attending: Oncology

## 2020-05-24 ENCOUNTER — Other Ambulatory Visit: Payer: Self-pay

## 2020-05-24 ENCOUNTER — Encounter: Payer: Self-pay | Admitting: *Deleted

## 2020-05-24 ENCOUNTER — Telehealth: Payer: Self-pay

## 2020-05-24 ENCOUNTER — Inpatient Hospital Stay (HOSPITAL_BASED_OUTPATIENT_CLINIC_OR_DEPARTMENT_OTHER): Payer: Medicare Other | Admitting: Oncology

## 2020-05-24 VITALS — BP 152/58 | HR 67 | Temp 98.3°F | Resp 15 | Ht 68.5 in | Wt 167.3 lb

## 2020-05-24 DIAGNOSIS — C181 Malignant neoplasm of appendix: Secondary | ICD-10-CM

## 2020-05-24 DIAGNOSIS — I10 Essential (primary) hypertension: Secondary | ICD-10-CM | POA: Diagnosis not present

## 2020-05-24 DIAGNOSIS — Z85820 Personal history of malignant melanoma of skin: Secondary | ICD-10-CM | POA: Insufficient documentation

## 2020-05-24 DIAGNOSIS — Z85038 Personal history of other malignant neoplasm of large intestine: Secondary | ICD-10-CM | POA: Insufficient documentation

## 2020-05-24 DIAGNOSIS — E785 Hyperlipidemia, unspecified: Secondary | ICD-10-CM | POA: Insufficient documentation

## 2020-05-24 DIAGNOSIS — M79671 Pain in right foot: Secondary | ICD-10-CM | POA: Diagnosis not present

## 2020-05-24 LAB — CEA (IN HOUSE-CHCC): CEA (CHCC-In House): <1 ng/mL (ref 0.00–5.00)

## 2020-05-24 NOTE — Progress Notes (Signed)
Faxed referral order, demographics, office note, med list to [facility] [phone]

## 2020-05-24 NOTE — Progress Notes (Signed)
  [facility] OFFICE PROGRESS NOTE   Diagnosis: Goblet cell tumor of the appendix  INTERVAL HISTORY:   Mr. Schoenfeld returns for a scheduled visit.  He feels well.  He reports a good appetite, though he does not eat as much as he did in the past.  He has received the COVID-19 vaccine.  He reports pain in the right fifth toe for the past year.  He has seen several physicians and the pain has not improved.  Objective:  Vital signs in last 24 hours:  Blood pressure (!) 152/58, pulse 67, temperature 98.3 F (36.8 C), temperature source Temporal, resp. rate 15, height 5' 8.5" (1.74 m), weight 167 lb 4.8 oz (75.9 kg), SpO2 98 %.    Lymphatics: No cervical, supraclavicular, axillary, or inguinal nodes Resp: Lungs clear bilaterally Cardio: Regular rhythm, 2/6 systolic murmur GI: No hepatosplenomegaly, no mass, nontender Vascular: Trace edema at the right lower leg Musculoskeletal: Bony prominence at the lateral aspect of the right fifth MTP joint with an overlying callus and valgus deformity   Lab Results:  Lab Results  Component Value Date   WBC 7.7 11/23/2019   HGB 14.7 11/23/2019   HCT 43.5 11/23/2019   MCV 96.0 11/23/2019   PLT 197.0 11/23/2019   NEUTROABS 4.8 11/23/2019    CMP  Lab Results  Component Value Date   NA 142 11/23/2019   K 3.8 11/23/2019   CL 105 11/23/2019   CO2 30 11/23/2019   GLUCOSE 91 11/23/2019   BUN 19 11/23/2019   CREATININE 1.18 11/23/2019   CALCIUM 9.4 11/23/2019   PROT 6.4 11/23/2019   ALBUMIN 4.3 11/23/2019   AST 17 11/23/2019   ALT 15 11/23/2019   ALKPHOS 41 11/23/2019   BILITOT 0.5 11/23/2019   GFRNONAA 57 (L) 04/08/2018   GFRAA >60 04/08/2018    Lab Results  Component Value Date   CEA1 <1.00 05/25/2019     Medications: I have reviewed the patient's current medications.   Assessment/Plan: 1. 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 T3 NX ? CT 04/08/2017-no evidence of  recurrent carcinoma  2. Acute appendicitis, status post an appendectomy 05/16/2016  3. Hypertension  4. History of an abdominal wall melanoma in 1995  5. Hyperlipidemia   Disposition: Eddie Fox is in clinical remission from the appendix carcinoma.  He would like to continue follow-up at the cancer center.  We will follow up on the CEA for today and he will return for an office visit in 1 year.  I will refer him to orthopedics for evaluation of the right foot pain.  Betsy Coder, MD  05/24/2020  1:01 PM

## 2020-05-24 NOTE — Telephone Encounter (Signed)
TC to pt per Dr Benay Spice to let him know that his CEA is normal. Patient verbalized understanding

## 2020-06-02 ENCOUNTER — Telehealth: Payer: Self-pay | Admitting: Oncology

## 2020-06-02 NOTE — Telephone Encounter (Signed)
Scheduled per 06/08 los, patient has been called and notified. 

## 2020-06-06 DIAGNOSIS — S93129A Dislocation of metatarsophalangeal joint of unspecified toe(s), initial encounter: Secondary | ICD-10-CM | POA: Diagnosis not present

## 2020-06-06 DIAGNOSIS — M79671 Pain in right foot: Secondary | ICD-10-CM | POA: Diagnosis not present

## 2020-06-06 DIAGNOSIS — S93121A Dislocation of metatarsophalangeal joint of right great toe, initial encounter: Secondary | ICD-10-CM | POA: Diagnosis not present

## 2020-06-06 DIAGNOSIS — L84 Corns and callosities: Secondary | ICD-10-CM | POA: Diagnosis not present

## 2020-06-07 ENCOUNTER — Other Ambulatory Visit: Payer: Self-pay | Admitting: Orthopaedic Surgery

## 2020-06-08 DIAGNOSIS — H52203 Unspecified astigmatism, bilateral: Secondary | ICD-10-CM | POA: Diagnosis not present

## 2020-06-08 DIAGNOSIS — H353131 Nonexudative age-related macular degeneration, bilateral, early dry stage: Secondary | ICD-10-CM | POA: Diagnosis not present

## 2020-06-08 DIAGNOSIS — H26492 Other secondary cataract, left eye: Secondary | ICD-10-CM | POA: Diagnosis not present

## 2020-06-08 DIAGNOSIS — Z961 Presence of intraocular lens: Secondary | ICD-10-CM | POA: Diagnosis not present

## 2020-06-13 DIAGNOSIS — C44519 Basal cell carcinoma of skin of other part of trunk: Secondary | ICD-10-CM | POA: Diagnosis not present

## 2020-06-13 DIAGNOSIS — L820 Inflamed seborrheic keratosis: Secondary | ICD-10-CM | POA: Diagnosis not present

## 2020-06-13 DIAGNOSIS — Z85820 Personal history of malignant melanoma of skin: Secondary | ICD-10-CM | POA: Diagnosis not present

## 2020-06-13 DIAGNOSIS — Z85828 Personal history of other malignant neoplasm of skin: Secondary | ICD-10-CM | POA: Diagnosis not present

## 2020-06-13 DIAGNOSIS — D225 Melanocytic nevi of trunk: Secondary | ICD-10-CM | POA: Diagnosis not present

## 2020-06-13 DIAGNOSIS — L570 Actinic keratosis: Secondary | ICD-10-CM | POA: Diagnosis not present

## 2020-06-15 ENCOUNTER — Other Ambulatory Visit: Payer: Self-pay

## 2020-06-15 ENCOUNTER — Encounter (HOSPITAL_BASED_OUTPATIENT_CLINIC_OR_DEPARTMENT_OTHER): Payer: Self-pay | Admitting: Orthopaedic Surgery

## 2020-06-15 NOTE — Progress Notes (Signed)
Made Tatiana at Dr. Pollie Friar office aware that patient will need to stay overnight for scheduled surgery due to lack of overnight care at home.

## 2020-06-21 ENCOUNTER — Other Ambulatory Visit (HOSPITAL_COMMUNITY)
Admission: RE | Admit: 2020-06-21 | Discharge: 2020-06-21 | Disposition: A | Discharge status: Home / Self Care | Payer: Medicare Other | Source: Ambulatory Visit | Attending: Orthopaedic Surgery | Admitting: Orthopaedic Surgery

## 2020-06-21 ENCOUNTER — Other Ambulatory Visit: Payer: Self-pay

## 2020-06-21 ENCOUNTER — Encounter (HOSPITAL_BASED_OUTPATIENT_CLINIC_OR_DEPARTMENT_OTHER)
Admission: RE | Admit: 2020-06-21 | Discharge: 2020-06-21 | Disposition: A | Discharge status: Home / Self Care | Payer: Medicare Other | Source: Ambulatory Visit | Attending: Orthopaedic Surgery | Admitting: Orthopaedic Surgery

## 2020-06-21 DIAGNOSIS — Z20822 Contact with and (suspected) exposure to covid-19: Secondary | ICD-10-CM | POA: Diagnosis not present

## 2020-06-21 DIAGNOSIS — Z01818 Encounter for other preprocedural examination: Secondary | ICD-10-CM | POA: Insufficient documentation

## 2020-06-21 LAB — SARS CORONAVIRUS 2 (TAT 6-24 HRS): SARS Coronavirus 2: NEGATIVE

## 2020-06-21 LAB — BASIC METABOLIC PANEL
Anion gap: 10 (ref 5–15)
BUN: 22 mg/dL (ref 8–23)
CO2: 25 mmol/L (ref 22–32)
Calcium: 9.2 mg/dL (ref 8.9–10.3)
Chloride: 107 mmol/L (ref 98–111)
Creatinine, Ser: 1.29 mg/dL — ABNORMAL HIGH (ref 0.61–1.24)
GFR calc Af Amer: >60 mL/min (ref >60)
GFR calc non Af Amer: 53 mL/min — ABNORMAL LOW (ref >60)
Glucose, Bld: 93 mg/dL (ref 70–99)
Potassium: 3.9 mmol/L (ref 3.5–5.1)
Sodium: 142 mmol/L (ref 135–145)

## 2020-06-21 NOTE — Progress Notes (Signed)

## 2020-06-23 ENCOUNTER — Encounter (HOSPITAL_BASED_OUTPATIENT_CLINIC_OR_DEPARTMENT_OTHER)
Admission: RE | Disposition: A | Discharge status: Home / Self Care | Payer: Self-pay | Source: Home / Self Care | Attending: Orthopaedic Surgery

## 2020-06-23 ENCOUNTER — Encounter (HOSPITAL_BASED_OUTPATIENT_CLINIC_OR_DEPARTMENT_OTHER): Payer: Self-pay | Admitting: Orthopaedic Surgery

## 2020-06-23 ENCOUNTER — Ambulatory Visit (HOSPITAL_BASED_OUTPATIENT_CLINIC_OR_DEPARTMENT_OTHER): Payer: Medicare Other | Admitting: Certified Registered"

## 2020-06-23 ENCOUNTER — Ambulatory Visit (HOSPITAL_BASED_OUTPATIENT_CLINIC_OR_DEPARTMENT_OTHER)
Admission: RE | Admit: 2020-06-23 | Discharge: 2020-06-24 | Disposition: A | Discharge status: Home / Self Care | Payer: Medicare Other | Attending: Orthopaedic Surgery | Admitting: Orthopaedic Surgery

## 2020-06-23 ENCOUNTER — Other Ambulatory Visit: Payer: Self-pay

## 2020-06-23 DIAGNOSIS — Z89011 Acquired absence of right thumb: Secondary | ICD-10-CM | POA: Insufficient documentation

## 2020-06-23 DIAGNOSIS — I10 Essential (primary) hypertension: Secondary | ICD-10-CM | POA: Diagnosis not present

## 2020-06-23 DIAGNOSIS — M205X1 Other deformities of toe(s) (acquired), right foot: Secondary | ICD-10-CM | POA: Diagnosis not present

## 2020-06-23 DIAGNOSIS — E785 Hyperlipidemia, unspecified: Secondary | ICD-10-CM | POA: Insufficient documentation

## 2020-06-23 DIAGNOSIS — M21629 Bunionette of unspecified foot: Secondary | ICD-10-CM | POA: Diagnosis present

## 2020-06-23 DIAGNOSIS — M7751 Other enthesopathy of right foot: Secondary | ICD-10-CM | POA: Diagnosis not present

## 2020-06-23 DIAGNOSIS — S93124A Dislocation of metatarsophalangeal joint of right lesser toe(s), initial encounter: Secondary | ICD-10-CM | POA: Insufficient documentation

## 2020-06-23 DIAGNOSIS — Z885 Allergy status to narcotic agent status: Secondary | ICD-10-CM | POA: Insufficient documentation

## 2020-06-23 DIAGNOSIS — Z7982 Long term (current) use of aspirin: Secondary | ICD-10-CM | POA: Insufficient documentation

## 2020-06-23 DIAGNOSIS — M21071 Valgus deformity, not elsewhere classified, right ankle: Secondary | ICD-10-CM | POA: Insufficient documentation

## 2020-06-23 DIAGNOSIS — X58XXXA Exposure to other specified factors, initial encounter: Secondary | ICD-10-CM | POA: Insufficient documentation

## 2020-06-23 DIAGNOSIS — M216X1 Other acquired deformities of right foot: Secondary | ICD-10-CM | POA: Diagnosis not present

## 2020-06-23 DIAGNOSIS — M719 Bursopathy, unspecified: Secondary | ICD-10-CM | POA: Insufficient documentation

## 2020-06-23 DIAGNOSIS — M21621 Bunionette of right foot: Secondary | ICD-10-CM | POA: Diagnosis not present

## 2020-06-23 DIAGNOSIS — G8918 Other acute postprocedural pain: Secondary | ICD-10-CM | POA: Diagnosis not present

## 2020-06-23 DIAGNOSIS — M71571 Other bursitis, not elsewhere classified, right ankle and foot: Secondary | ICD-10-CM | POA: Diagnosis not present

## 2020-06-23 DIAGNOSIS — Z79899 Other long term (current) drug therapy: Secondary | ICD-10-CM | POA: Insufficient documentation

## 2020-06-23 HISTORY — PX: METATARSAL HEAD EXCISION: SHX5027

## 2020-06-23 HISTORY — DX: Other specified postprocedural states: R11.2

## 2020-06-23 SURGERY — EXCISION, METATARSAL BONE, HEAD
Anesthesia: General | Site: Toe | Laterality: Right

## 2020-06-23 MED ORDER — LACTATED RINGERS IV SOLN: INTRAVENOUS | Status: DC

## 2020-06-23 MED ORDER — FENTANYL CITRATE (PF) 100 MCG/2ML IJ SOLN
100.0000 ug | Freq: Once | INTRAMUSCULAR | Status: AC
  Administered 2020-06-23: 50 ug via INTRAVENOUS

## 2020-06-23 MED ORDER — AMLODIPINE BESYLATE 10 MG PO TABS: 10.0000 mg | ORAL_TABLET | Freq: Every day | ORAL | Status: DC

## 2020-06-23 MED ORDER — LIDOCAINE HCL (CARDIAC) PF 100 MG/5ML IV SOSY
PREFILLED_SYRINGE | INTRAVENOUS | Status: DC | PRN
  Administered 2020-06-23: 80 mg via INTRAVENOUS

## 2020-06-23 MED ORDER — MIDAZOLAM HCL 2 MG/2ML IJ SOLN
INTRAMUSCULAR | Status: AC
  Filled 2020-06-23: qty 2

## 2020-06-23 MED ORDER — LACTATED RINGERS IV SOLN: INTRAVENOUS | Status: DC | PRN

## 2020-06-23 MED ORDER — DEXAMETHASONE SODIUM PHOSPHATE 10 MG/ML IJ SOLN
INTRAMUSCULAR | Status: AC
  Filled 2020-06-23: qty 1

## 2020-06-23 MED ORDER — OXYCODONE HCL 5 MG/5ML PO SOLN: 5.0000 mg | Freq: Once | ORAL | Status: DC | PRN

## 2020-06-23 MED ORDER — ASPIRIN 325 MG PO TABS: 325.0000 mg | ORAL_TABLET | Freq: Every day | ORAL | Status: DC

## 2020-06-23 MED ORDER — HYDROCHLOROTHIAZIDE 25 MG PO TABS: 25.0000 mg | ORAL_TABLET | Freq: Every day | ORAL | Status: DC

## 2020-06-23 MED ORDER — HYDROCODONE-ACETAMINOPHEN 7.5-325 MG PO TABS: 1.0000 | ORAL_TABLET | ORAL | Status: DC | PRN

## 2020-06-23 MED ORDER — DOCUSATE SODIUM 100 MG PO CAPS: 100.0000 mg | ORAL_CAPSULE | Freq: Two times a day (BID) | ORAL | Status: DC

## 2020-06-23 MED ORDER — MORPHINE SULFATE (PF) 4 MG/ML IV SOLN: 0.5000 mg | INTRAVENOUS | Status: DC | PRN

## 2020-06-23 MED ORDER — ADULT MULTIVITAMIN W/MINERALS CH: 1.0000 | ORAL_TABLET | Freq: Every day | ORAL | Status: DC

## 2020-06-23 MED ORDER — NAPROXEN 250 MG PO TABS: 250.0000 mg | ORAL_TABLET | Freq: Two times a day (BID) | ORAL | Status: DC

## 2020-06-23 MED ORDER — CEFAZOLIN SODIUM-DEXTROSE 2-4 GM/100ML-% IV SOLN
INTRAVENOUS | Status: AC
  Filled 2020-06-23: qty 100

## 2020-06-23 MED ORDER — HYDROCODONE-ACETAMINOPHEN 5-325 MG PO TABS
1.0000 | ORAL_TABLET | ORAL | Status: DC | PRN
  Administered 2020-06-24 (×2): 2 via ORAL
  Filled 2020-06-23 (×2): qty 2

## 2020-06-23 MED ORDER — AMISULPRIDE (ANTIEMETIC) 5 MG/2ML IV SOLN: 10.0000 mg | Freq: Once | INTRAVENOUS | Status: DC | PRN

## 2020-06-23 MED ORDER — ROPIVACAINE HCL 5 MG/ML IJ SOLN
INTRAMUSCULAR | Status: DC | PRN
Start: 2020-06-23 — End: 2020-06-23
  Administered 2020-06-23: 30 mL via PERINEURAL

## 2020-06-23 MED ORDER — PROPOFOL 10 MG/ML IV BOLUS
INTRAVENOUS | Status: DC | PRN
  Administered 2020-06-23: 110 mg via INTRAVENOUS

## 2020-06-23 MED ORDER — ACETAMINOPHEN 500 MG PO TABS
500.0000 mg | ORAL_TABLET | Freq: Four times a day (QID) | ORAL | Status: DC
  Administered 2020-06-24: 500 mg via ORAL
  Filled 2020-06-23: qty 1

## 2020-06-23 MED ORDER — ONDANSETRON HCL 4 MG/2ML IJ SOLN: 4.0000 mg | Freq: Four times a day (QID) | INTRAMUSCULAR | Status: DC | PRN

## 2020-06-23 MED ORDER — PROPOFOL 500 MG/50ML IV EMUL
INTRAVENOUS | Status: AC
  Filled 2020-06-23: qty 50

## 2020-06-23 MED ORDER — FENTANYL CITRATE (PF) 100 MCG/2ML IJ SOLN
INTRAMUSCULAR | Status: AC
  Filled 2020-06-23: qty 2

## 2020-06-23 MED ORDER — ONDANSETRON HCL 4 MG/2ML IJ SOLN
INTRAMUSCULAR | Status: DC | PRN
  Administered 2020-06-23: 4 mg via INTRAVENOUS

## 2020-06-23 MED ORDER — FENTANYL CITRATE (PF) 100 MCG/2ML IJ SOLN
INTRAMUSCULAR | Status: DC | PRN
  Administered 2020-06-23: 25 ug via INTRAVENOUS

## 2020-06-23 MED ORDER — OXYCODONE HCL 5 MG PO TABS: 5.0000 mg | ORAL_TABLET | Freq: Three times a day (TID) | ORAL | 0 refills | Status: DC | PRN

## 2020-06-23 MED ORDER — OXYCODONE HCL 5 MG PO TABS: 5.0000 mg | ORAL_TABLET | Freq: Once | ORAL | Status: DC | PRN

## 2020-06-23 MED ORDER — EPHEDRINE SULFATE 50 MG/ML IJ SOLN
INTRAMUSCULAR | Status: DC | PRN
  Administered 2020-06-23: 15 mg via INTRAVENOUS

## 2020-06-23 MED ORDER — HYDROMORPHONE HCL 1 MG/ML IJ SOLN: 0.2500 mg | INTRAMUSCULAR | Status: DC | PRN

## 2020-06-23 MED ORDER — ONDANSETRON HCL 4 MG/2ML IJ SOLN
INTRAMUSCULAR | Status: AC
  Filled 2020-06-23: qty 2

## 2020-06-23 MED ORDER — PROMETHAZINE HCL 25 MG/ML IJ SOLN: 6.2500 mg | INTRAMUSCULAR | Status: DC | PRN

## 2020-06-23 MED ORDER — CEFAZOLIN SODIUM-DEXTROSE 2-4 GM/100ML-% IV SOLN: 2.0000 g | INTRAVENOUS | Status: DC

## 2020-06-23 MED ORDER — ONDANSETRON HCL 4 MG PO TABS: 4.0000 mg | ORAL_TABLET | Freq: Four times a day (QID) | ORAL | Status: DC | PRN

## 2020-06-23 MED ORDER — CEFAZOLIN SODIUM-DEXTROSE 1-4 GM/50ML-% IV SOLN
1.0000 g | Freq: Four times a day (QID) | INTRAVENOUS | Status: AC
  Administered 2020-06-23 (×2): 1 g via INTRAVENOUS
  Filled 2020-06-23 (×2): qty 50

## 2020-06-23 MED ORDER — CEFAZOLIN SODIUM-DEXTROSE 2-3 GM-%(50ML) IV SOLR
INTRAVENOUS | Status: DC | PRN
  Administered 2020-06-23: 2 g via INTRAVENOUS

## 2020-06-23 MED ORDER — ACETAMINOPHEN 325 MG PO TABS: 325.0000 mg | ORAL_TABLET | Freq: Four times a day (QID) | ORAL | Status: DC | PRN

## 2020-06-23 MED ORDER — MIDAZOLAM HCL 2 MG/2ML IJ SOLN
2.0000 mg | Freq: Once | INTRAMUSCULAR | Status: AC
  Administered 2020-06-23: 1 mg via INTRAVENOUS

## 2020-06-23 MED ORDER — SIMVASTATIN 40 MG PO TABS: 40.0000 mg | ORAL_TABLET | Freq: Every evening | ORAL | Status: DC

## 2020-06-23 SURGICAL SUPPLY — 65 items
BANDAGE ESMARK 6X9 LF (GAUZE/BANDAGES/DRESSINGS) IMPLANT
BENZOIN TINCTURE PRP APPL 2/3 (GAUZE/BANDAGES/DRESSINGS) IMPLANT
BIT DRILL CALIBR MINI 1.5 (BIT) ×2 IMPLANT
BLADE AVERAGE 25X9 (BLADE) ×2 IMPLANT
BLADE PRESCISION 7.0X.51X18.5 (BLADE) IMPLANT
BLADE SURG 15 STRL LF DISP TIS (BLADE) ×2 IMPLANT
BLADE SURG 15 STRL SS (BLADE) ×4
BNDG COHESIVE 4X5 TAN STRL (GAUZE/BANDAGES/DRESSINGS) IMPLANT
BNDG ELASTIC 4X5.8 VLCR STR LF (GAUZE/BANDAGES/DRESSINGS) ×4 IMPLANT
BNDG ELASTIC 6X5.8 VLCR STR LF (GAUZE/BANDAGES/DRESSINGS) IMPLANT
BNDG ESMARK 4X9 LF (GAUZE/BANDAGES/DRESSINGS) ×2 IMPLANT
BNDG ESMARK 6X9 LF (GAUZE/BANDAGES/DRESSINGS)
BOOT STEPPER DURA XLG (SOFTGOODS) ×2 IMPLANT
CHLORAPREP W/TINT 26 (MISCELLANEOUS) ×2 IMPLANT
COVER BACK TABLE 60X90IN (DRAPES) ×2 IMPLANT
COVER WAND RF STERILE (DRAPES) IMPLANT
CUFF TOURN SGL QUICK 34 (TOURNIQUET CUFF)
CUFF TRNQT CYL 34X4.125X (TOURNIQUET CUFF) IMPLANT
DECANTER SPIKE VIAL GLASS SM (MISCELLANEOUS) IMPLANT
DRAPE C-ARMOR (DRAPES) IMPLANT
DRAPE EXTREMITY T 121X128X90 (DISPOSABLE) ×2 IMPLANT
DRAPE IMP U-DRAPE 54X76 (DRAPES) ×2 IMPLANT
DRAPE OEC MINIVIEW 54X84 (DRAPES) ×2 IMPLANT
DRAPE U-SHAPE 47X51 STRL (DRAPES) ×2 IMPLANT
ELECT REM PT RETURN 9FT ADLT (ELECTROSURGICAL) ×2
ELECTRODE REM PT RTRN 9FT ADLT (ELECTROSURGICAL) ×1 IMPLANT
GAUZE SPONGE 4X4 12PLY STRL (GAUZE/BANDAGES/DRESSINGS) ×2 IMPLANT
GAUZE XEROFORM 1X8 LF (GAUZE/BANDAGES/DRESSINGS) ×2 IMPLANT
GLOVE BIOGEL M STRL SZ7.5 (GLOVE) ×2 IMPLANT
GLOVE BIOGEL PI IND STRL 8 (GLOVE) ×1 IMPLANT
GLOVE BIOGEL PI INDICATOR 8 (GLOVE) ×1
GOWN STRL REUS W/ TWL LRG LVL3 (GOWN DISPOSABLE) ×1 IMPLANT
GOWN STRL REUS W/ TWL XL LVL3 (GOWN DISPOSABLE) ×1 IMPLANT
GOWN STRL REUS W/TWL LRG LVL3 (GOWN DISPOSABLE) ×2
GOWN STRL REUS W/TWL XL LVL3 (GOWN DISPOSABLE) ×2
NDL SAFETY ECLIPSE 18X1.5 (NEEDLE) ×1 IMPLANT
NEEDLE HYPO 18GX1.5 SHARP (NEEDLE) ×2
NS IRRIG 1000ML POUR BTL (IV SOLUTION) ×2 IMPLANT
PACK BASIN DAY SURGERY FS (CUSTOM PROCEDURE TRAY) ×2 IMPLANT
PAD CAST 4YDX4 CTTN HI CHSV (CAST SUPPLIES) ×1 IMPLANT
PADDING CAST COTTON 4X4 STRL (CAST SUPPLIES) ×2
PADDING CAST SYNTHETIC 4 (CAST SUPPLIES) ×1
PADDING CAST SYNTHETIC 4X4 STR (CAST SUPPLIES) ×1 IMPLANT
PENCIL SMOKE EVACUATOR (MISCELLANEOUS) ×2 IMPLANT
SCREW L/P 2X10 CORTICAL (Screw) ×2 IMPLANT
SCREW L/P 2X11 CORTICAL (Screw) ×2 IMPLANT
SHEET MEDIUM DRAPE 40X70 STRL (DRAPES) ×2 IMPLANT
SLEEVE SCD COMPRESS KNEE MED (MISCELLANEOUS) ×2 IMPLANT
SPLINT FIBERGLASS 4X30 (CAST SUPPLIES) ×2 IMPLANT
SPONGE LAP 18X18 RF (DISPOSABLE) IMPLANT
STOCKINETTE 6  STRL (DRAPES) ×2
STOCKINETTE 6 STRL (DRAPES) ×1 IMPLANT
STRIP CLOSURE SKIN 1/2X4 (GAUZE/BANDAGES/DRESSINGS) IMPLANT
SUCTION FRAZIER HANDLE 10FR (MISCELLANEOUS) ×2
SUCTION TUBE FRAZIER 10FR DISP (MISCELLANEOUS) ×1 IMPLANT
SUT ETHILON 3 0 PS 1 (SUTURE) ×2 IMPLANT
SUT FIBERWIRE 2-0 18 17.9 3/8 (SUTURE)
SUT MNCRL AB 3-0 PS2 18 (SUTURE) ×2 IMPLANT
SUT PDS AB 2-0 CT2 27 (SUTURE) ×2 IMPLANT
SUT VIC AB 3-0 FS2 27 (SUTURE) IMPLANT
SUTURE FIBERWR 2-0 18 17.9 3/8 (SUTURE) IMPLANT
SYR 10ML LL (SYRINGE) ×2 IMPLANT
SYR BULB EAR ULCER 3OZ GRN STR (SYRINGE) ×2 IMPLANT
TOWEL GREEN STERILE FF (TOWEL DISPOSABLE) ×4 IMPLANT
TUBE CONNECTING 20X1/4 (TUBING) ×2 IMPLANT

## 2020-06-23 NOTE — H&P (Signed)
PREOPERATIVE H&P  Chief Complaint: Right bunionette with fifth MTP dislocation and bursitis  HPI: Eddie Fox is a 79 y.o. male who presents for preoperative history and physical with a diagnosis of right bunionette with fifth MTP dislocation and bursitis. Symptoms are rated as moderate to severe, and have been worsening.  This is significantly impairing activities of daily living.  He has elected for surgical management.   Past Medical History:  Diagnosis Date  . Benign neoplasm of colon   . ED (erectile dysfunction)   . IBS (irritable bowel syndrome)   . Melanoma ([place])    history of  . Other and unspecified hyperlipidemia   . Other chronic nonalcoholic liver disease   . PONV (postoperative nausea and vomiting)   . Unspecified essential hypertension   . Unspecified vitamin D deficiency    Past Surgical History:  Procedure Laterality Date  . AMPUTATION     distal phalanx of right thumb- traumatic  . CHOLECYSTECTOMY  10/03/09   lap  . LAMINECTOMY  1995   L 3-T11 Harrington rods in back  . LAPAROSCOPIC APPENDECTOMY N/A 05/16/2016   Procedure: APPENDECTOMY LAPAROSCOPIC;  Surgeon: Greer Pickerel, MD;  Location: [facility];  Service: General;  Laterality: N/A;  . [place]   right upper abd, Dr. Ronnald Ramp  . ORIF ANKLE FRACTURE  1967   Social History   Socioeconomic History  . Marital status: Married    Spouse name: Not on file  . Number of children: 2  . Years of education: Not on file  . Highest education level: Not on file  Occupational History  . Occupation: retired    Comment: retired from [employer] 04/2002  Tobacco Use  . Smoking status: Never Smoker  . Smokeless tobacco: Never Used  Vaping Use  . Vaping Use: Never used  Substance and Sexual Activity  . Alcohol use: No  . Drug use: No  . Sexual activity: Yes    Partners: Female  Other Topics Concern  . Not on file  Social History Narrative  . Not on file   Social Determinants of Health    Financial Resource Strain: Low Risk   . Difficulty of Paying Living Expenses: Not hard at all  Food Insecurity: No Food Insecurity  . Worried About Charity fundraiser in the Last Year: Never true  . Ran Out of Food in the Last Year: Never true  Transportation Needs: No Transportation Needs  . Lack of Transportation (Medical): No  . Lack of Transportation (Non-Medical): No  Physical Activity: Inactive  . Days of Exercise per Week: 0 days  . Minutes of Exercise per Session: 0 min  Stress: No Stress Concern Present  . Feeling of Stress : Not at all  Social Connections:   . Frequency of Communication with Friends and Family:   . Frequency of Social Gatherings with Friends and Family:   . Attends Religious Services:   . Active Member of Clubs or Organizations:   . Attends Archivist Meetings:   Marland Kitchen Marital Status:    Family History  Problem Relation Age of Onset  . Heart disease Mother        CHF  . Hypertension Mother   . Diabetes Mother   . Stroke Mother   . Hypertension Father   . Heart disease Father        CHF  . Hypertension Sister   . Hypertension Sister   . Depression Neg Hx   . Alcohol  abuse Neg Hx   . Drug abuse Neg Hx    Allergies  Allergen Reactions  . Hydrocodone     pts states made him sick.  . Pneumococcal Vaccine     REACTION: significant arm swelling, redness   Prior to Admission medications   Medication Sig Start Date End Date Taking? Authorizing Provider  amLODipine (NORVASC) 10 MG tablet TAKE 1 TABLET BY MOUTH  DAILY 01/11/20  Yes Copland, Frederico Hamman, MD  aspirin 325 MG tablet Take 325 mg by mouth daily.   Yes [provider]  Calcium Carb-Cholecalciferol (CALCIUM+D3 PO) Take 1 tablet by mouth every morning.   Yes [provider]  fluticasone (FLONASE) 50 MCG/ACT nasal spray USE 2 SPRAY(S) IN EACH NOSTRIL ONCE DAILY AS NEEDED 12/03/19  Yes Copland, Frederico Hamman, MD  hydrochlorothiazide (HYDRODIURIL) 25 MG tablet TAKE 1 TABLET BY  MOUTH  DAILY 01/11/20  Yes Copland, Frederico Hamman, MD  Multiple Vitamins-Minerals (CENTRUM SILVER ULTRA MENS) TABS Take 1 tablet by mouth daily.   Yes [provider]  simvastatin (ZOCOR) 40 MG tablet TAKE 1 TABLET BY MOUTH ONCE DAILY IN THE EVENING 11/23/19  Yes Copland, Frederico Hamman, MD     Positive ROS: All other systems have been reviewed and were otherwise negative with the exception of those mentioned in the HPI and as above.  Physical Exam:  General: Alert, no acute distress Cardiovascular: No pedal edema Respiratory: No cyanosis, no use of accessory musculature GI: No organomegaly, abdomen is soft and non-tender Skin: No lesions in the area of chief complaint Neurologic: Sensation intact distally Psychiatric: Patient is competent for consent with normal mood and affect Lymphatic: No axillary or cervical lymphadenopathy  MUSCULOSKELETAL: Right foot demonstrates large callus formation over fifth bunionette.  There is deviation dislocation of the fifth MTP joint notable on exam.  He has tenderness palpation along the lateral eminence at the fifth metatarsal head.  This is not passively correctable.  Sensation grossly intact distally.  Foot is warm and well-perfused.  Assessment: Right foot painful bunionette with fifth metatarsal phalangeal joint dislocation and bursitis   Plan: Plan for right fifth metatarsal osteotomy and excision of bunionette with bursectomy.  Also MTP capsulotomy and open reduction of the fifth metatarsal phalangeal joint..  The risks benefits and alternatives were discussed with the patient including but not limited to the risks of nonoperative treatment, versus surgical intervention including infection, bleeding, nerve injury,  blood clots, cardiopulmonary complications, morbidity, mortality, among others, and they were willing to proceed.   Patient will be admitted for 24-hour obvious as he has no one at home to help him.  We will plan for discharge in the  morning.    Erle Crocker, MD    06/23/2020 11:57 AM

## 2020-06-23 NOTE — Anesthesia Postprocedure Evaluation (Signed)
Anesthesia Post Note  Patient: JUDAH CARCHI  Procedure(s) Performed: RIGHT 5TH METATARSAL OSTEOTOMY WITH BUNIONETTE EXCISION, OPEN REDUCTION OF FIFTH METATARSAL PHALANGEAL JOINT, FIFTH METATARSAL PHALANGEAL CAPSULOTOMY, BURSECTOMY (Right Toe)     Patient location during evaluation: Phase II Anesthesia Type: General Level of consciousness: awake Pain management: pain level controlled Vital Signs Assessment: post-procedure vital signs reviewed and stable Respiratory status: spontaneous breathing Cardiovascular status: stable Postop Assessment: no apparent nausea or vomiting Anesthetic complications: no   No complications documented.  Last Vitals:  Vitals:   06/23/20 1346 06/23/20 1400  BP: (!) 138/55 (!) 141/55  Pulse:  66  Resp: 18 12  Temp: 36.6 C   SpO2: 100% 97%    Last Pain:  Vitals:   06/23/20 1400  TempSrc:   PainSc: 0-No pain                 John F Salome Arnt

## 2020-06-23 NOTE — Transfer of Care (Signed)
Immediate Anesthesia Transfer of Care Note  Patient: Eddie Fox  Procedure(s) Performed: RIGHT 5TH METATARSAL OSTEOTOMY WITH BUNIONETTE EXCISION, OPEN REDUCTION OF FIFTH METATARSAL PHALANGEAL JOINT, FIFTH METATARSAL PHALANGEAL CAPSULOTOMY, BURSECTOMY (Right Toe)  Patient Location: PACU  Anesthesia Type:GA combined with regional for post-op pain  Level of Consciousness: awake, alert , oriented and patient cooperative  Airway & Oxygen Therapy: Patient Spontanous Breathing and Patient connected to face mask oxygen  Post-op Assessment: Report given to RN and Post -op Vital signs reviewed and stable  Post vital signs: Reviewed and stable  Last Vitals:  Vitals Value Taken Time  BP    Temp    Pulse    Resp 18 06/23/20 1347  SpO2    Vitals shown include unvalidated device data.  Last Pain:  Vitals:   06/23/20 1026  TempSrc: Oral  PainSc: 0-No pain      Patients Stated Pain Goal: 3 (60/10/93 2355)  Complications: No complications documented.

## 2020-06-23 NOTE — Progress Notes (Signed)
Assisted Dr. Sabra Heck with right, ultrasound guided, popliteal block. Side rails up, monitors on throughout procedure. See vital signs in flow sheet. Tolerated Procedure well.

## 2020-06-23 NOTE — Op Note (Signed)
Eddie Fox male 79 y.o. 06/23/2020  PreOperative Diagnosis: Right foot bursitis Right boot 5th metatarsal valgus Right foot bunionette Right foot dislocated MTP joint   PostOperative Diagnosis: same  PROCEDURE: Right lateral foot bursectomy Right foot bunionette excision Right fifth metatarsal osteotomy 5th MTP capsulotomy Open treatment of right foot dislocated 5th MTP joint  SURGEON: Melony Overly, MD  ASSISTANT: None  ANESTHESIA: General with local nerve block  FINDINGS: Significant right foot lateral bursitis and bursal hypertrophy Fifth metatarsal valgus Bunionette Dislocated fifth MTPJ joint  IMPLANTS: Arthrex 2.0 millimeter screws  INDICATIONS:79 y.o. male had failed conservative treatment with regard to his right foot.  He has significant of pain and swelling along lateral aspect of his foot and notable bunionette with dislocated fifth metatarsal phalangeal joint.  He had varus to the fifth metatarsal resulting in this.  Patient opted to proceed with surgery.   Patient understood the risks, benefits and alternatives to surgery which include but are not limited to wound healing complications, infection, nonunion, malunion, need for further surgery as well as damage to surrounding structures. They also understood the potential for continued pain in that there were no guarantees of acceptable outcome After weighing these risks the patient opted to proceed with surgery.  PROCEDURE: Patient was identified in the preoperative holding area.  The right was marked by myself.  Consent was signed by myself and the patient.  Block was performed by anesthesia in the preoperative holding area.  Patient was taken to the operative suite and placed supine on the operative table.  Right anesthesia was induced without difficulty. Bump was placed under the operative hip and bone foam was used.  All bony prominences were well padded.   The extremity was prepped and draped in the  usual sterile fashion and surgical timeout was performed.  4 inch ankle Esmarch tourniquet was placed.  Right lateral foot bursectomy: I began making a longitudinal incision overlying the lateral border of the foot in line with the fifth metatarsal.  This was taken sharply down through skin and subcutaneous tissue.  Blunt dissection was used to to identify veins and Bovie cautery was used to cauterize skin bleeders.  The incision was taken sharply down to bone.  There was significant amount of bursal thickening and hypertrophy around the fifth metatarsal head.  There was fluid in this area.  Using a 15 blade the bursal tissue was excised about the fifth metatarsal head and lateral foot.  This tissue was excised sharply with a 15 blade and moved.  This gained access to the lateral aspect of the fifth MTP joint capsule.  Bunionette excision: Then a separate deep incision was made dorsally and plantarly about the fifth metatarsal.  This was taken around the fifth metatarsal.  Subperiosteal dissection was created dorsally and plantarly.  Then a longitudinal incision was made through the MTP capsule laterally.  It was noted that the fifth metatarsal phalangeal joint was dislocated medially.  The capsular tissue was then elevated dorsally and plantarly about the foot.  A subperiosteal dissection was created proximally along the fifth metatarsal to gain access to the entire fifth metatarsal.  Then using a sagittal saw the bunionette was excised in line with the fifth metatarsal.  There was large bony prominence on the head of the fifth metatarsal laterally and this was removed completely.  Care was taken to preserve the fifth metatarsal head joint surface.  Fifth metatarsal osteotomy: Then using the sagittal saw a long Z shaped osteotomy was created  along the fifth metatarsal.  The plantar portion of the osteotomy that contained the fifth metatarsal head was translated medially and held provisionally with a  pointed reduction forcep.  Then 2  2.0 millimeter screws were placed to fix the osteotomy.  There was good purchase of both of the screws.  The remaining overhanging bone was removed with a sagittal saw.  Portion of this was used for bone graft on the exposed cancellous surface of the translated osteotomy site.  MTP capsulotomy: I then turned my attention to the MTP joint of the fifth toe.  It was persistently dislocated medially.  A separate incision was made dorsally between the fourth and fifth metatarsal heads.  This was taken sharply down through skin and subcutaneous tissue.  Then blunt dissection was used to gain access to the dorsal capsule overlying the fifth MTP joint.  A capsulotomy was created with a 15 blade in line with the metatarsal.  This was taken down to the joint.  The joint was then reduced under direct visualization.  A lateral release was performed of the collateral ligaments on the side and there was a small bony fragment that was identified.   Right open treatment of fifth MTP dislocation: Then the fifth MTP joint was reduced under direct visualization through the capsulotomy.  It was held provisionally using a folded up Ray-Tec between the fourth and fifth toes.  This was able to maintain a good reduction.  The capsular tissue was then closed in an imbricated fashion using a 2-0 FiberWire stitch to maintain reduction of the fifth MTP joint.  This was done using an interrupted horizontal mattress suture.  The extra capsular tissue laterally was excised with a 15 blade and discarded.  Once the capsular tissue was adequately closed the fifth MTP remained located.  The wounds were then irrigated copiously with normal saline.  A 3-0 Monocryl stitch was used to close the periosteal tissue overlying the fifth metatarsal osteotomy.  Then the skin was closed for the incisions in a layered fashion using 3-0 Monocryl and 3-0 nylon stitch.  Soft dressing was placed.  He was placed in a cam  walker boot.  Patient was awaken from anesthesia and taken recovery in stable condition.  There were no complications.     POST OPERATIVE INSTRUCTIONS: Heel weightbearing in the postoperative shoe to the right lower extremity Patient will be admitted for observation due to no home support Patient will be discharged in the a.m. No need for DVT prophylaxis in this ambulatory patient   BLOOD LOSS:  Minimal         DRAINS: none         SPECIMEN: none       COMPLICATIONS:  * No complications entered in OR log *         Disposition: PACU - hemodynamically stable.         Condition: stable

## 2020-06-23 NOTE — Anesthesia Preprocedure Evaluation (Signed)
Anesthesia Evaluation  Patient identified by MRN, date of birth, ID band Patient awake    Reviewed: Allergy & Precautions, H&P , NPO status , Patient's Chart, lab work & pertinent test results  History of Anesthesia Complications (+) PONV  Airway Mallampati: II  TM Distance: >3 FB Neck ROM: Full    Dental no notable dental hx. (+) Poor Dentition, Dental Advisory Given, Loose   Pulmonary neg pulmonary ROS,    Pulmonary exam normal breath sounds clear to auscultation       Cardiovascular hypertension, Pt. on medications  Rhythm:Regular Rate:Normal     Neuro/Psych negative neurological ROS  negative psych ROS   GI/Hepatic negative GI ROS, Neg liver ROS,   Endo/Other  negative endocrine ROS  Renal/GU negative Renal ROS  negative genitourinary   Musculoskeletal   Abdominal (+)  Abdomen: soft. Bowel sounds: normal.  Peds  Hematology negative hematology ROS (+)   Anesthesia Other Findings   Reproductive/Obstetrics negative OB ROS                             Anesthesia Physical  Anesthesia Plan  ASA: II  Anesthesia Plan: General   Post-op Pain Management:  Regional for Post-op pain   Induction: Intravenous  PONV Risk Score and Plan: 3 and Ondansetron, Dexamethasone, Midazolam and Treatment may vary due to age or medical condition  Airway Management Planned: LMA  Additional Equipment:   Intra-op Plan:   Post-operative Plan: Extubation in OR  Informed Consent: I have reviewed the patients History and Physical, chart, labs and discussed the procedure including the risks, benefits and alternatives for the proposed anesthesia with the patient or authorized representative who has indicated his/her understanding and acceptance.     Dental advisory given  Plan Discussed with: CRNA and Surgeon  Anesthesia Plan Comments:         Anesthesia Quick Evaluation

## 2020-06-23 NOTE — Discharge Instructions (Signed)
DR. Lucia Gaskins FOOT & ANKLE SURGERY POST-OP INSTRUCTIONS   Pain Management 1. The numbing medicine and your leg will last around 18 hours, take a dose of your pain medicine as soon as you feel it wearing off to avoid rebound pain. 2. Keep your foot elevated above heart level.  Make sure that your heel hangs free ('floats'). 3. Take all prescribed medication as directed. 4. If taking narcotic pain medication you may want to use an over-the-counter stool softener to avoid constipation. 5. You may take over-the-counter NSAIDs (ibuprofen, naproxen, etc.) as well as over-the-counter acetaminophen as directed on the packaging as a supplement for your pain and may also use it to wean away from the prescription medication.  Activity ? Heel WB in post operative shoe. ? Keep dressing in place  First Postoperative Visit 1. Your first postop visit will be at least 2 weeks after surgery.  This should be scheduled when you schedule surgery. 2. If you do not have a postoperative visit scheduled please call [phone] to schedule an appointment. 3. At the appointment your incision will be evaluated for suture removal, x-rays will be obtained if necessary.  General Instructions 1. Swelling is very common after foot and ankle surgery.  It often takes 3 months for the foot and ankle to begin to feel comfortable.  Some amount of swelling will persist for 6-12 months. 2. DO NOT change the dressing.  If there is a problem with the dressing (too tight, loose, gets wet, etc.) please contact Dr. Pollie Friar office. 3. DO NOT get the dressing wet.  For showers you can use an over-the-counter cast cover or wrap a washcloth around the top of your dressing and then cover it with a plastic bag and tape it to your leg. 4. DO NOT soak the incision (no tubs, pools, bath, etc.) until you have approval from Dr. Lucia Gaskins.  Contact Dr. Huel Cote office or go to Emergency Room if: 1. Temperature above 101 F. 2. Increasing pain that is  unresponsive to pain medication or elevation 3. Excessive redness or swelling in your foot 4. Dressing problems - excessive bloody drainage, looseness or tightness, or if dressing gets wet 5. Develop pain, swelling, warmth, or discoloration of your calf   Post Anesthesia Home Care Instructions  Activity: Get plenty of rest for the remainder of the day. A responsible individual must stay with you for 24 hours following the procedure.  For the next 24 hours, DO NOT: -Drive a car -Paediatric nurse -Drink alcoholic beverages -Take any medication unless instructed by your physician -Make any legal decisions or sign important papers.  Meals: Start with liquid foods such as gelatin or soup. Progress to regular foods as tolerated. Avoid greasy, spicy, heavy foods. If nausea and/or vomiting occur, drink only clear liquids until the nausea and/or vomiting subsides. Call your physician if vomiting continues.  Special Instructions/Symptoms: Your throat may feel dry or sore from the anesthesia or the breathing tube placed in your throat during surgery. If this causes discomfort, gargle with warm salt water. The discomfort should disappear within 24 hours.  Regional Anesthesia Blocks  1. Numbness or the inability to move the "blocked" extremity may last from 3-48 hours after placement. The length of time depends on the medication injected and your individual response to the medication. If the numbness is not going away after 48 hours, call your surgeon.  2. The extremity that is blocked will need to be protected until the numbness is gone and the  Strength  has returned. Because you cannot feel it, you will need to take extra care to avoid injury. Because it may be weak, you may have difficulty moving it or using it. You may not know what position it is in without looking at it while the block is in effect.  3. For blocks in the legs and feet, returning to weight bearing and walking needs to be done  carefully. You will need to wait until the numbness is entirely gone and the strength has returned. You should be able to move your leg and foot normally before you try and bear weight or walk. You will need someone to be with you when you first try to ensure you do not fall and possibly risk injury.  4. Bruising and tenderness at the needle site are common side effects and will resolve in a few days.  5. Persistent numbness or new problems with movement should be communicated to the surgeon or the [facility] [phone] [place] [phone]).

## 2020-06-23 NOTE — Anesthesia Procedure Notes (Signed)
Procedure Name: LMA Insertion Date/Time: 06/23/2020 12:31 PM Performed by: Signe Colt, CRNA Pre-anesthesia Checklist: Patient identified, Emergency Drugs available, Suction available and Patient being monitored Patient Re-evaluated:Patient Re-evaluated prior to induction Oxygen Delivery Method: Circle system utilized Preoxygenation: Pre-oxygenation with 100% oxygen Induction Type: IV induction Ventilation: Mask ventilation without difficulty LMA: LMA inserted LMA Size: 4.0 Number of attempts: 1 Airway Equipment and Method: Bite block Placement Confirmation: positive ETCO2 Tube secured with: Tape Dental Injury: Teeth and Oropharynx as per pre-operative assessment

## 2020-06-23 NOTE — Anesthesia Procedure Notes (Signed)
Anesthesia Regional Block: Popliteal block   Pre-Anesthetic Checklist: ,, timeout performed, Correct Patient, Correct Site, Correct Laterality, Correct Procedure, Correct Position, site marked, Risks and benefits discussed,  Surgical consent,  Pre-op evaluation,  At surgeon's request and post-op pain management  Laterality: Right  Prep: chloraprep       Needles:  Injection technique: Single-shot  Needle Type: Stimiplex     Needle Length: 9cm  Needle Gauge: 21     Additional Needles:   Procedures:,,,, ultrasound used (permanent image in chart),,,,  Narrative:  Start time: 06/23/2020 11:50 AM End time: 06/23/2020 11:55 AM Injection made incrementally with aspirations every 5 mL.  Performed by: Personally  Anesthesiologist: Lynda Rainwater, MD

## 2020-06-24 ENCOUNTER — Encounter (HOSPITAL_BASED_OUTPATIENT_CLINIC_OR_DEPARTMENT_OTHER): Payer: Self-pay | Admitting: Orthopaedic Surgery

## 2020-06-24 DIAGNOSIS — Z79899 Other long term (current) drug therapy: Secondary | ICD-10-CM | POA: Diagnosis not present

## 2020-06-24 DIAGNOSIS — I10 Essential (primary) hypertension: Secondary | ICD-10-CM | POA: Diagnosis not present

## 2020-06-24 DIAGNOSIS — Z7982 Long term (current) use of aspirin: Secondary | ICD-10-CM | POA: Diagnosis not present

## 2020-06-24 DIAGNOSIS — M21071 Valgus deformity, not elsewhere classified, right ankle: Secondary | ICD-10-CM | POA: Diagnosis not present

## 2020-06-24 DIAGNOSIS — Z89011 Acquired absence of right thumb: Secondary | ICD-10-CM | POA: Diagnosis not present

## 2020-06-24 DIAGNOSIS — E785 Hyperlipidemia, unspecified: Secondary | ICD-10-CM | POA: Diagnosis not present

## 2020-06-24 DIAGNOSIS — S93124A Dislocation of metatarsophalangeal joint of right lesser toe(s), initial encounter: Secondary | ICD-10-CM | POA: Diagnosis not present

## 2020-06-24 DIAGNOSIS — M719 Bursopathy, unspecified: Secondary | ICD-10-CM | POA: Diagnosis not present

## 2020-06-24 DIAGNOSIS — Z885 Allergy status to narcotic agent status: Secondary | ICD-10-CM | POA: Diagnosis not present

## 2020-06-24 DIAGNOSIS — M21621 Bunionette of right foot: Secondary | ICD-10-CM | POA: Diagnosis not present

## 2020-06-27 NOTE — Discharge Summary (Signed)
Patient ID: Eddie Fox MRN: [id number] DOB/AGE: 1941/01/29 79 y.o.  Admit date: 06/23/2020 Discharge date: 06/27/2020  Admission Diagnoses:  Active Problems:   Bunionette   Discharge Diagnoses:  Same  Past Medical History:  Diagnosis Date  . Benign neoplasm of colon   . ED (erectile dysfunction)   . IBS (irritable bowel syndrome)   . Melanoma ([place])    history of  . Other and unspecified hyperlipidemia   . Other chronic nonalcoholic liver disease   . PONV (postoperative nausea and vomiting)   . Unspecified essential hypertension   . Unspecified vitamin D deficiency     Surgeries: Procedure(s): RIGHT 5TH METATARSAL OSTEOTOMY WITH BUNIONETTE EXCISION, OPEN REDUCTION OF FIFTH METATARSAL PHALANGEAL JOINT, FIFTH METATARSAL PHALANGEAL CAPSULOTOMY, BURSECTOMY on 06/23/2020   Consultants:   Discharged Condition: Improved  Hospital Course: Eddie Fox is an 79 y.o. male who was admitted 06/23/2020 for operative treatment of 5th toe and bunionette deformity. Patient has severe unremitting pain that affects sleep, daily activities, and work/hobbies. After pre-op clearance the patient was taken to the operating room on 06/23/2020 and underwent  Procedure(s): RIGHT 5TH METATARSAL OSTEOTOMY WITH BUNIONETTE EXCISION, OPEN REDUCTION OF FIFTH METATARSAL PHALANGEAL JOINT, FIFTH METATARSAL PHALANGEAL CAPSULOTOMY, BURSECTOMY.    Patient was admitted for 23-hour obs.  The morning of postoperative day 1 he was suitable for discharge.  His pain was controlled.  He had someone to help take him home.  Pain was controlled.  Patient was given perioperative antibiotics:  Anti-infectives (From admission, onward)   Start     Dose/Rate Route Frequency Ordered Stop   06/23/20 1430  ceFAZolin (ANCEF) IVPB 1 g/50 mL premix        1 g 100 mL/hr over 30 Minutes Intravenous Every 6 hours 06/23/20 1418 06/24/20 0030   06/23/20 1000  ceFAZolin (ANCEF) IVPB 2g/100 mL premix  Status:  Discontinued        2  g 200 mL/hr over 30 Minutes Intravenous On call to O.R. 06/23/20 0959 06/23/20 1418       Patient was given sequential compression devices, early ambulation, and chemoprophylaxis to prevent DVT.  Patient benefited maximally from hospital stay and there were no complications.    Recent vital signs: No data found.   Recent laboratory studies: No results for input(s): WBC, HGB, HCT, PLT, NA, K, CL, CO2, BUN, CREATININE, GLUCOSE, INR, CALCIUM in the last 72 hours.  Invalid input(s): PT, 2   Discharge Medications:   Allergies as of 06/24/2020      Reactions   Hydrocodone    pts states made him sick.   Pneumococcal Vaccine    REACTION: significant arm swelling, redness      Medication List    TAKE these medications   amLODipine 10 MG tablet Commonly known as: NORVASC TAKE 1 TABLET BY MOUTH  DAILY   aspirin 325 MG tablet Take 325 mg by mouth daily.   CALCIUM+D3 PO Take 1 tablet by mouth every morning.   Centrum Silver Ultra Mens Tabs Take 1 tablet by mouth daily.   fluticasone 50 MCG/ACT nasal spray Commonly known as: FLONASE USE 2 SPRAY(S) IN EACH NOSTRIL ONCE DAILY AS NEEDED   hydrochlorothiazide 25 MG tablet Commonly known as: HYDRODIURIL TAKE 1 TABLET BY MOUTH  DAILY   oxyCODONE 5 MG immediate release tablet Commonly known as: Roxicodone Take 1 tablet (5 mg total) by mouth every 8 (eight) hours as needed.   simvastatin 40 MG tablet Commonly known as: ZOCOR TAKE 1 TABLET  BY MOUTH ONCE DAILY IN THE EVENING       Diagnostic Studies: No results found.  Disposition: Discharge disposition: 01-Home or Self Care      Postoperative instructions: Heel weightbearing to operative extremity Follow-up in 2 weeks for wound check and x-rays. Call the office with concerns.    Follow-up Information    Erle Crocker, MD.   Specialty: Orthopedic Surgery Contact information: [address] [place] [address] [phone]                 Signed: Erle Crocker 06/27/2020, 12:40 PM

## 2020-07-04 DIAGNOSIS — M79671 Pain in right foot: Secondary | ICD-10-CM | POA: Diagnosis not present

## 2020-07-11 DIAGNOSIS — M79671 Pain in right foot: Secondary | ICD-10-CM | POA: Diagnosis not present

## 2020-08-03 DIAGNOSIS — Z9889 Other specified postprocedural states: Secondary | ICD-10-CM | POA: Diagnosis not present

## 2020-09-22 ENCOUNTER — Telehealth: Payer: Self-pay | Admitting: Family Medicine

## 2020-09-22 NOTE — Telephone Encounter (Signed)
I have no problem seeing him  Fyi to pcp

## 2020-09-22 NOTE — Telephone Encounter (Signed)
Patient called.  Patient said his blood pressure today was 140/59 and he has slight pressure in his head.  Patient said the highest his blood pressure was 161/59. Dr.Copland doesn't have any openings until Wednesday.  I spoke to [place] and he said for patient to check his blood pressure daily until his appointment on Wednesday and bring in the numbers.  Patient said he didn't want to wait until Wednesday feeling this way. Patient asked to be seen at the next available appointment.  Dr.Tower had openings on Monday.  Patient scheduled appointment at 11:00 on Monday with Dr.Tower. Dr.Tower, do you want to see patient on Monday or wait for Dr.Copland on Wednesday? I let patient know I would call him back and let him know. Patient said he doesn't have shortness of breath or chest pain.

## 2020-09-23 NOTE — Telephone Encounter (Signed)
I let patient know that Dr.Tower would see him and I cancelled patient's appointment with Dr. Lorelei Pont on Wednesday.

## 2020-09-26 ENCOUNTER — Encounter: Payer: Self-pay | Admitting: Family Medicine

## 2020-09-26 ENCOUNTER — Ambulatory Visit (INDEPENDENT_AMBULATORY_CARE_PROVIDER_SITE_OTHER): Payer: Medicare Other | Admitting: Family Medicine

## 2020-09-26 ENCOUNTER — Other Ambulatory Visit: Payer: Self-pay

## 2020-09-26 VITALS — BP 152/70 | HR 48 | Temp 97.4°F | Ht 70.0 in | Wt 161.1 lb

## 2020-09-26 DIAGNOSIS — I498 Other specified cardiac arrhythmias: Secondary | ICD-10-CM | POA: Insufficient documentation

## 2020-09-26 DIAGNOSIS — I10 Essential (primary) hypertension: Secondary | ICD-10-CM | POA: Diagnosis not present

## 2020-09-26 DIAGNOSIS — R0789 Other chest pain: Secondary | ICD-10-CM

## 2020-09-26 LAB — CBC WITH DIFFERENTIAL/PLATELET
Basophils Absolute: 0.1 10*3/uL (ref 0.0–0.1)
Basophils Relative: 0.7 % (ref 0.0–3.0)
Eosinophils Absolute: 0.2 10*3/uL (ref 0.0–0.7)
Eosinophils Relative: 2.6 % (ref 0.0–5.0)
HCT: 42.6 % (ref 39.0–52.0)
Hemoglobin: 15 g/dL (ref 13.0–17.0)
Lymphocytes Relative: 31.5 % (ref 12.0–46.0)
Lymphs Abs: 2.3 10*3/uL (ref 0.7–4.0)
MCHC: 35.1 g/dL (ref 30.0–36.0)
MCV: 94.1 fl (ref 78.0–100.0)
Monocytes Absolute: 0.5 10*3/uL (ref 0.1–1.0)
Monocytes Relative: 6.8 % (ref 3.0–12.0)
Neutro Abs: 4.3 10*3/uL (ref 1.4–7.7)
Neutrophils Relative %: 58.4 % (ref 43.0–77.0)
Platelets: 204 10*3/uL (ref 150.0–400.0)
RBC: 4.53 Mil/uL (ref 4.22–5.81)
RDW: 13.5 % (ref 11.5–15.5)
WBC: 7.4 10*3/uL (ref 4.0–10.5)

## 2020-09-26 LAB — COMPREHENSIVE METABOLIC PANEL
ALT: 12 U/L (ref 0–53)
AST: 15 U/L (ref 0–37)
Albumin: 4.5 g/dL (ref 3.5–5.2)
Alkaline Phosphatase: 43 U/L (ref 39–117)
BUN: 25 mg/dL — ABNORMAL HIGH (ref 6–23)
CO2: 31 mEq/L (ref 19–32)
Calcium: 9.7 mg/dL (ref 8.4–10.5)
Chloride: 102 mEq/L (ref 96–112)
Creatinine, Ser: 1.66 mg/dL — ABNORMAL HIGH (ref 0.40–1.50)
GFR: 38.78 mL/min — ABNORMAL LOW (ref >60.00)
Glucose, Bld: 85 mg/dL (ref 70–99)
Potassium: 3.5 mEq/L (ref 3.5–5.1)
Sodium: 142 mEq/L (ref 135–145)
Total Bilirubin: 0.6 mg/dL (ref 0.2–1.2)
Total Protein: 7 g/dL (ref 6.0–8.3)

## 2020-09-26 LAB — TSH: TSH: 2.98 u[IU]/mL (ref 0.35–4.50)

## 2020-09-26 NOTE — Assessment & Plan Note (Signed)
Difficult to describe and not exertional He has h/o HTN and hyperlipidemia  Today -in bigeminy on EKG and with some non specific ST changes  Will ref to cardiology for further eval  Inst to go to ER if sudden cp/ sob or other angina symptoms (reviewed)

## 2020-09-26 NOTE — Progress Notes (Signed)
Subjective:    Patient ID: Eddie Fox, male    DOB: 07-12-1941, 79 y.o.   MRN: [id number]  This visit occurred during the SARS-CoV-2 public health emergency.  Safety protocols were in place, including screening questions prior to the visit, additional usage of staff PPE, and extensive cleaning of exam room while observing appropriate contact time as indicated for disinfecting solutions.    HPI Pt f/u for BP check and has had some nausea and headache  Wt Readings from Last 3 Encounters:  09/26/20 161 lb 1 oz (73.1 kg)  06/23/20 166 lb 7.2 oz (75.5 kg)  05/24/20 167 lb 4.8 oz (75.9 kg)   23.11 kg/m  79 yo pt of Dr Lorelei Pont   He has had a pressure sensation in his L head  Also funny feeling (tension) in mid chest (this occurs in the am)  Not exertional  Not nauseated  No heartburn or pain   At home his bp was 142/59 at home  (new cuff/arm)   Perhaps a bit dizzy now and then  No vision change  No sinus pain at all   Otherwise feeling ok  No new stress  No anxiety    HTN He takes hctz 25 mg daily  Also amlodipine 10 mg daily   BP Readings from Last 3 Encounters:  09/26/20 (!) 152/70  06/24/20 (!) 134/55  05/24/20 (!) 152/58     Pulse Readings from Last 3 Encounters:  09/26/20 (!) 48  06/24/20 (!) 59  05/24/20 67    Today EKG shows NSR with bigeminy  Non specific QRS widening and ant fascicular block  ? 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 Chest discomfort 09/26/2020  . 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 Bunionette 06/23/2020  . 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 CHOLECYSTECTOMY, LAPAROSCOPIC, HX OF 12/28/2010  . Vitamin D deficiency 01/30/2010  . FATTY LIVER DISEASE 08/12/2009  . Essential hypertension 09/14/2008  . COLONIC POLYPS 07/08/2007  . Hyperlipidemia 07/08/2007  . ERECTILE DYSFUNCTION 07/08/2007  . 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 Benign neoplasm of colon   . ED (erectile dysfunction)   . IBS (irritable bowel syndrome)   . Melanoma ([place])    history of  . Other and unspecified hyperlipidemia   . Other chronic nonalcoholic liver disease   . PONV (postoperative nausea and vomiting)   . Unspecified essential hypertension   . Unspecified vitamin D deficiency    Past Surgical History:  Procedure Laterality Date  . AMPUTATION     distal phalanx of right thumb- traumatic  . CHOLECYSTECTOMY  10/03/09   lap  . LAMINECTOMY  1995  L 3-T11 Harrington rods in back  . LAPAROSCOPIC APPENDECTOMY N/A 05/16/2016   Procedure: APPENDECTOMY LAPAROSCOPIC;  Surgeon: Greer Pickerel, MD;  Location: [facility];  Service: General;  Laterality: N/A;  . [place]   right upper abd, Dr. Ronnald Ramp  . METATARSAL HEAD EXCISION Right 06/23/2020   Procedure: RIGHT 5TH METATARSAL OSTEOTOMY WITH BUNIONETTE EXCISION, OPEN REDUCTION OF FIFTH METATARSAL PHALANGEAL JOINT, FIFTH METATARSAL PHALANGEAL CAPSULOTOMY, BURSECTOMY;  Surgeon: Erle Crocker, MD;  Location: [facility];  Service: Orthopedics;  Laterality: Right;  LENGTH OF SURGERY: 1.5 HOURS  . ORIF ANKLE FRACTURE  1967   Social History   Tobacco Use  . Smoking status: Never Smoker  . Smokeless tobacco: Never Used  Vaping Use  . Vaping Use: Never used  Substance Use  Topics  . Alcohol use: No  . Drug use: No   Family History  Problem Relation Age of Onset  . Heart disease Mother        CHF  . Hypertension Mother   . Diabetes Mother   . Stroke Mother   . Hypertension Father   . Heart disease Father        CHF  . Hypertension Sister   . Hypertension Sister   . Depression Neg Hx   . Alcohol abuse Neg Hx   . Drug abuse Neg Hx    Allergies  Allergen Reactions  . Hydrocodone     pts states made him sick.  . Pneumococcal Vaccine     REACTION: significant arm swelling, redness   Current Outpatient Medications on File Prior to Visit  Medication Sig Dispense Refill  . amLODipine (NORVASC) 10 MG tablet TAKE 1 TABLET BY MOUTH  DAILY 90 tablet 3  . aspirin 325 MG tablet Take 325 mg by mouth daily.    . Calcium Carb-Cholecalciferol (CALCIUM+D3 PO) Take 1 tablet by mouth every morning.    . fluticasone (FLONASE) 50 MCG/ACT nasal spray USE 2 SPRAY(S) IN EACH NOSTRIL ONCE DAILY AS NEEDED 48 g 3  . hydrochlorothiazide (HYDRODIURIL) 25 MG tablet TAKE 1 TABLET BY MOUTH  DAILY 90 tablet 3  . Multiple Vitamins-Minerals (CENTRUM SILVER ULTRA MENS) TABS Take 1 tablet by mouth daily.    . simvastatin (ZOCOR) 40 MG tablet TAKE 1 TABLET BY MOUTH ONCE DAILY IN THE EVENING 90 tablet 3   No current facility-administered medications on file prior to visit.    Review of Systems  Constitutional: Negative for activity change, appetite change, fatigue, fever and unexpected weight change.  HENT: Negative for congestion, rhinorrhea, sore throat and trouble swallowing.   Eyes: Negative for pain, redness, itching and visual disturbance.  Respiratory: Positive for chest tightness. Negative for apnea, cough, choking, shortness of breath, wheezing and stridor.   Cardiovascular: Negative for chest pain, palpitations and leg swelling.  Gastrointestinal: Negative for abdominal pain, blood in stool, constipation, diarrhea and nausea.       No heartburn  Endocrine: Negative for  cold intolerance, heat intolerance, polydipsia and polyuria.  Genitourinary: Negative for difficulty urinating, dysuria, frequency and urgency.  Musculoskeletal: Negative for arthralgias, joint swelling and myalgias.  Skin: Negative for pallor and rash.  Neurological: Positive for headaches. Negative for dizziness, tremors, seizures, syncope, facial asymmetry, speech difficulty, weakness, light-headedness and numbness.  Hematological: Negative for adenopathy. Does not bruise/bleed easily.  Psychiatric/Behavioral: Negative for decreased concentration and dysphoric mood. The patient is not nervous/anxious.        Lost his wife/ adjusting  Objective:   Physical Exam Constitutional:      General: He is not in acute distress.    Appearance: He is well-developed and normal weight. He is not ill-appearing.  HENT:     Head: Normocephalic and atraumatic.     Ears:     Comments: Very hard of hearing     Mouth/Throat:     Mouth: Mucous membranes are moist.     Pharynx: Oropharynx is clear.  Eyes:     General: No scleral icterus.    Conjunctiva/sclera: Conjunctivae normal.     Pupils: Pupils are equal, round, and reactive to light.  Neck:     Vascular: No carotid bruit.  Cardiovascular:     Rate and Rhythm: Normal rate. Rhythm irregular.     Pulses: Normal pulses.     Heart sounds: Normal heart sounds. No murmur heard.      Comments: Regularly irregular -consistent with bigeminy  Pulmonary:     Effort: Pulmonary effort is normal. No respiratory distress.     Breath sounds: Normal breath sounds. No wheezing or rales.  Musculoskeletal:        General: No tenderness.     Cervical back: Normal range of motion and neck supple. No tenderness.     Right lower leg: No edema.     Left lower leg: No edema.     Comments: No tenderness or palp cords in legs  Lymphadenopathy:     Cervical: No cervical adenopathy.  Skin:    Coloration: Skin is not pale.     Findings: No erythema or rash.    Neurological:     Mental Status: He is alert.     Sensory: No sensory deficit.     Coordination: Coordination normal.     Deep Tendon Reflexes: Reflexes normal.  Psychiatric:        Mood and Affect: Mood normal.        Cognition and Memory: Cognition normal.           Assessment & Plan:   Problem List Items Addressed This Visit      Cardiovascular and Mediastinum   Essential hypertension - Primary    Per pt bp has been elevated, but he forgot his medicine this am so bp is up anyway inst him to continue hctz 25 and amlodipine 10 mg daily  Schedule fu with Dr Lorelei Pont and bring his bp cuff to test it  Labs today Disc DASH eating/more water intake  If suddenly worse inst to call       Relevant Orders   EKG 12-Lead (Completed)   Ambulatory referral to Cardiology   CBC with Differential/Platelet (Completed)   Comprehensive metabolic panel (Completed)   TSH (Completed)   Atrial bigeminy    On EKG with vague chest discomfort (unsure if related)  In setting of adv age and HTN and hyperlipidemia  Will ref to cardiology      Relevant Orders   Ambulatory referral to Cardiology     Other   Chest discomfort    Difficult to describe and not exertional He has h/o HTN and hyperlipidemia  Today -in bigeminy on EKG and with some non specific ST changes  Will ref to cardiology for further eval  Inst to go to ER if sudden cp/ sob or other angina symptoms (reviewed)       Relevant Orders   EKG 12-Lead (Completed)   Ambulatory referral to Cardiology

## 2020-09-26 NOTE — Assessment & Plan Note (Signed)
On EKG with vague chest discomfort (unsure if related)  In setting of adv age and HTN and hyperlipidemia  Will ref to cardiology

## 2020-09-26 NOTE — Assessment & Plan Note (Addendum)
Per pt bp has been elevated, but he forgot his medicine this am so bp is up anyway inst him to continue hctz 25 and amlodipine 10 mg daily  Schedule fu with Dr Lorelei Pont and bring his bp cuff to test it  Labs today Disc DASH eating/more water intake  If suddenly worse inst to call

## 2020-09-26 NOTE — Patient Instructions (Addendum)
Make sure you drink enough fluids  Watch your sodium intake  Avoid excessive caffeine   Keep an eye on blood pressure at home  Schedule a follow up appt with Dr Lorelei Pont and bring your blood pressure cuff (take your medicine every day especially the day of your appointment)   Blood pressure is high today off your medicine   You have bigeminy on EKG and I would like you to see cardiology for a visit  The office will call you to set that up

## 2020-09-28 ENCOUNTER — Encounter: Payer: Self-pay | Admitting: Internal Medicine

## 2020-09-28 ENCOUNTER — Ambulatory Visit: Payer: Medicare Other | Admitting: Family Medicine

## 2020-09-28 ENCOUNTER — Ambulatory Visit (INDEPENDENT_AMBULATORY_CARE_PROVIDER_SITE_OTHER): Payer: Medicare Other | Admitting: Internal Medicine

## 2020-09-28 ENCOUNTER — Telehealth: Payer: Self-pay

## 2020-09-28 ENCOUNTER — Other Ambulatory Visit: Payer: Self-pay

## 2020-09-28 VITALS — BP 142/82 | HR 35 | Ht 70.0 in | Wt 160.0 lb

## 2020-09-28 DIAGNOSIS — E7800 Pure hypercholesterolemia, unspecified: Secondary | ICD-10-CM

## 2020-09-28 DIAGNOSIS — Z79899 Other long term (current) drug therapy: Secondary | ICD-10-CM

## 2020-09-28 DIAGNOSIS — R072 Precordial pain: Secondary | ICD-10-CM

## 2020-09-28 DIAGNOSIS — I498 Other specified cardiac arrhythmias: Secondary | ICD-10-CM

## 2020-09-28 DIAGNOSIS — I10 Essential (primary) hypertension: Secondary | ICD-10-CM

## 2020-09-28 MED ORDER — ASPIRIN EC 81 MG PO TBEC
81.0000 mg | DELAYED_RELEASE_TABLET | Freq: Every day | ORAL | 3 refills | Status: DC
Start: 2020-09-28 — End: 2020-11-25

## 2020-09-28 MED ORDER — NITROGLYCERIN 0.4 MG SL SUBL: 0.4000 mg | SUBLINGUAL_TABLET | SUBLINGUAL | 3 refills | Status: AC | PRN

## 2020-09-28 NOTE — Progress Notes (Addendum)
Cardiology Office Note:    Date:  09/28/2020   ID:  Eddie Fox, DOB 1941/08/02, MRN [id number]  PCP:  Owens Loffler, MD  Cardiologist:  No primary care provider on file.  Electrophysiologist:  None   Referring MD: Owens Loffler, MD   Chief Complaint/Reason for Referral: Chest discomfort  History of Present Illness:    Eddie Fox is a 79 y.o. male with a history of HLD, NASH, HTN who presents for evaluation of chest discomfort and PACs.   Notes 2 weeks of substernal and left sided "funny feeling" in chest while walking. Will resolve after 30 minutes of rest. Also notes having a headache. On Sunday he felt very bad with these symptoms. Denies SOB, palpitations, or syncope. Occasional dizziness and mildly elevated BP. Noted to have atrial bigeminy on ECG at PCP office.   Past Medical History:  Diagnosis Date  . Benign neoplasm of colon   . ED (erectile dysfunction)   . IBS (irritable bowel syndrome)   . Melanoma ([place])    history of  . Other and unspecified hyperlipidemia   . Other chronic nonalcoholic liver disease   . PONV (postoperative nausea and vomiting)   . Unspecified essential hypertension   . Unspecified vitamin D deficiency     Past Surgical History:  Procedure Laterality Date  . AMPUTATION     distal phalanx of right thumb- traumatic  . CHOLECYSTECTOMY  10/03/09   lap  . LAMINECTOMY  1995   L 3-T11 Harrington rods in back  . LAPAROSCOPIC APPENDECTOMY N/A 05/16/2016   Procedure: APPENDECTOMY LAPAROSCOPIC;  Surgeon: Greer Pickerel, MD;  Location: [facility];  Service: General;  Laterality: N/A;  . [place]   right upper abd, Dr. Ronnald Ramp  . METATARSAL HEAD EXCISION Right 06/23/2020   Procedure: RIGHT 5TH METATARSAL OSTEOTOMY WITH BUNIONETTE EXCISION, OPEN REDUCTION OF FIFTH METATARSAL PHALANGEAL JOINT, FIFTH METATARSAL PHALANGEAL CAPSULOTOMY, BURSECTOMY;  Surgeon: Erle Crocker, MD;  Location: [facility];  Service:  Orthopedics;  Laterality: Right;  LENGTH OF SURGERY: 1.5 HOURS  . ORIF ANKLE FRACTURE  1967    Current Medications: Current Meds  Medication Sig  . amLODipine (NORVASC) 10 MG tablet TAKE 1 TABLET BY MOUTH  DAILY  . aspirin 325 MG tablet Take 325 mg by mouth daily.  . Calcium Carb-Cholecalciferol (CALCIUM+D3 PO) Take 1 tablet by mouth every morning.  . fluticasone (FLONASE) 50 MCG/ACT nasal spray USE 2 SPRAY(S) IN EACH NOSTRIL ONCE DAILY AS NEEDED  . hydrochlorothiazide (HYDRODIURIL) 25 MG tablet TAKE 1 TABLET BY MOUTH  DAILY  . Multiple Vitamins-Minerals (CENTRUM SILVER ULTRA MENS) TABS Take 1 tablet by mouth daily.  . simvastatin (ZOCOR) 40 MG tablet TAKE 1 TABLET BY MOUTH ONCE DAILY IN THE EVENING     Allergies:   Hydrocodone and Pneumococcal vaccine   Social History   Tobacco Use  . Smoking status: Never Smoker  . Smokeless tobacco: Never Used  Vaping Use  . Vaping Use: Never used  Substance Use Topics  . Alcohol use: No  . Drug use: No     Family History: The patient's family history includes Diabetes in his mother; Heart disease in his father and mother; Hypertension in his father, mother, sister, and sister; Stroke in his mother. There is no history of Depression, Alcohol abuse, or Drug abuse.  ROS:   Please see the history of present illness.    All other systems reviewed and are negative.  EKGs/Labs/Other Studies Reviewed:  The following studies were reviewed today:  EKG:  09/26/20 - NSR, PACs, inferior infarct pattern.   Recent Labs: 09/26/2020: ALT 12; BUN 25; Creatinine, Ser 1.66; Hemoglobin 15.0; Platelets 204.0; Potassium 3.5; Sodium 142; TSH 2.98  Recent Lipid Panel    Component Value Date/Time   CHOL 144 11/23/2019 1151   TRIG 105.0 11/23/2019 1151   HDL 41.00 11/23/2019 1151   CHOLHDL 4 11/23/2019 1151   VLDL 21.0 11/23/2019 1151   LDLCALC 82 11/23/2019 1151   LDLDIRECT 171.9 07/08/2007 1044    Physical Exam:    VS:  BP (!) 142/82   Pulse  (!) 35   Ht 5\' 10"  (1.778 m)   Wt 160 lb (72.6 kg)   SpO2 98%   BMI 22.96 kg/m     Wt Readings from Last 5 Encounters:  09/28/20 160 lb (72.6 kg)  09/26/20 161 lb 1 oz (73.1 kg)  06/23/20 166 lb 7.2 oz (75.5 kg)  05/24/20 167 lb 4.8 oz (75.9 kg)  05/18/20 171 lb 8 oz (77.8 kg)    Constitutional: No acute distress Eyes: sclera non-icteric, normal conjunctiva and lids ENMT: normal dentition, moist mucous membranes Cardiovascular: regular rhythm, normal rate, no murmurs. S1 and S2 normal. Radial pulses normal bilaterally. No jugular venous distention.  Respiratory: clear to auscultation bilaterally GI : normal bowel sounds, soft and nontender. No distention.   MSK: extremities warm, well perfused. No edema.  NEURO: grossly nonfocal exam, moves all extremities. PSYCH: alert and oriented x 3, normal mood and affect.   ASSESSMENT:    1. Precordial pain   2. Atrial bigeminy   3. Essential hypertension   4. Pure hypercholesterolemia    PLAN:    Precordial pain - Plan: MYOCARDIAL PERFUSION IMAGING, ECHOCARDIOGRAM COMPLETE  -Risk factors of HTN and HLD, concern for ischemia with substernal chest pain. Participated in shared decision making, will perform stress test. He should reduce the dose of ASA to 81 mg daily, and will provide script for nitro with guidelines for ER presentation.   Atrial bigeminy - Will obtain echocardiogram to evaluate atrial size, structure and function/intracardiac pressures to further evaluate frequent ectopy. Discussed grossly benign nature.   Essential hypertension - Continue amlodipine and HCTZ at this time, BP mildly elevated. Will uptitrate therapy after testing if indicated.   Pure hypercholesterolemia - continue simvastatin 40 mg daily. LDL goal 70.    Cherlynn Kaiser, MD [place]  [facility]    Medication Adjustments/Labs and Tests Ordered: Current medicines are reviewed at length with the patient today.  Concerns regarding medicines  are outlined above.   Orders Placed This Encounter  Procedures  . MYOCARDIAL PERFUSION IMAGING  . ECHOCARDIOGRAM COMPLETE    Meds ordered this encounter  Medications  . aspirin EC 81 MG tablet    Sig: Take 1 tablet (81 mg total) by mouth daily. Swallow whole.    Dispense:  90 tablet    Refill:  3  . nitroGLYCERIN (NITROSTAT) 0.4 MG SL tablet    Sig: Place 1 tablet (0.4 mg total) under the tongue every 5 (five) minutes as needed for chest pain.    Dispense:  90 tablet    Refill:  3    Patient Instructions  Medication Instructions:  PLEASE REDUCE ASPIRIN to 81mg  PLEASE START NITRO 0.4mg  sublingual tablet AS NEEDED FOR CHEST PAIN EVERY 5 MINUTES FOR 3 DOSES *If you need a refill on your cardiac medications before your next appointment, please call your pharmacy*  Lab Work: None  Ordered At This Time.  If you have labs (blood work) drawn today and your tests are completely normal, you will receive your results only by: Marland Kitchen MyChart Message (if you have MyChart) OR . A paper copy in the mail If you have any lab test that is abnormal or we need to change your treatment, we will call you to review the results.  Testing/Procedures: Your physician has requested that you have an echocardiogram. Echocardiography is a painless test that uses sound waves to create images of your heart. It provides your doctor with information about the size and shape of your heart and how well your heart's chambers and valves are working. You may receive an ultrasound enhancing agent through an IV if needed to better visualize your heart during the echo.This procedure takes approximately one hour. There are no restrictions for this procedure. This will take place at the [address].    Your physician has requested that you have a lexiscan myoview. For further information please visit [email or web address]. Please follow instruction sheet, as given.  This will take place at [address]  The test will take approximately 3 to 4 hours to complete; you may bring reading material.  If someone comes with you to your appointment, they will need to remain in the main lobby due to limited space in the testing area.    How to prepare for your Myocardial Perfusion Test:  Do not eat or drink 3 hours prior to your test, except you may have water.  Do not consume products containing caffeine (regular or decaffeinated) 12 hours prior to your test. (ex: coffee, chocolate, sodas, tea).  Do wear comfortable clothes (no dresses or overalls) and walking shoes, tennis shoes preferred (No heels or open toe shoes are allowed).  Do NOT wear cologne, perfume, aftershave, or lotions (deodorant is allowed).  If you use an inhaler, use it the AM of your test and bring it with you.   If you use a nebulizer, use it the AM of your test.   If these instructions are not followed, your test will have to be rescheduled.  Follow-Up: At [facility], you and your health needs are our priority.  As part of our continuing mission to provide you with exceptional heart care, we have created designated Provider Care Teams.  These Care Teams include your primary Cardiologist (physician) and Advanced Practice Providers (APPs -  Physician Assistants and Nurse Practitioners) who all work together to provide you with the care you need, when you need it.  We recommend signing up for the patient portal called "MyChart".  Sign up information is provided on this After Visit Summary.  MyChart is used to connect with patients for Virtual Visits (Telemedicine).  Patients are able to view lab/test results, encounter notes, upcoming appointments, etc.  Non-urgent messages can be sent to your provider as well.   To learn more about what you can do with MyChart, go to [email or web address].    Your next appointment:   3 week(s)  The format for your next appointment:   In Person  Provider:   Cherlynn Kaiser,  MD

## 2020-09-28 NOTE — Patient Instructions (Addendum)
Medication Instructions:  PLEASE REDUCE ASPIRIN to 81mg  PLEASE START NITRO 0.4mg  sublingual tablet AS NEEDED FOR CHEST PAIN EVERY 5 MINUTES FOR 3 DOSES *If you need a refill on your cardiac medications before your next appointment, please call your pharmacy*  Lab Work: None Ordered At This Time.  If you have labs (blood work) drawn today and your tests are completely normal, you will receive your results only by:  [place] (if you have MyChart) OR  A paper copy in the mail If you have any lab test that is abnormal or we need to change your treatment, we will call you to review the results.  Testing/Procedures: Your physician has requested that you have an echocardiogram. Echocardiography is a painless test that uses sound waves to create images of your heart. It provides your doctor with information about the size and shape of your heart and how well your hearts chambers and valves are working. You may receive an ultrasound enhancing agent through an IV if needed to better visualize your heart during the echo.This procedure takes approximately one hour. There are no restrictions for this procedure. This will take place at the [address].    Your physician has requested that you have a lexiscan myoview. For further information please visit [email or web address]. Please follow instruction sheet, as given.  This will take place at [address]  The test will take approximately 3 to 4 hours to complete; you may bring reading material.  If someone comes with you to your appointment, they will need to remain in the main lobby due to limited space in the testing area.    How to prepare for your Myocardial Perfusion Test:  Do not eat or drink 3 hours prior to your test, except you may have water.  Do not consume products containing caffeine (regular or decaffeinated) 12 hours prior to your test. (ex: coffee, chocolate, sodas, tea).  Do wear comfortable  clothes (no dresses or overalls) and walking shoes, tennis shoes preferred (No heels or open toe shoes are allowed).  Do NOT wear cologne, perfume, aftershave, or lotions (deodorant is allowed).  If you use an inhaler, use it the AM of your test and bring it with you.   If you use a nebulizer, use it the AM of your test.   If these instructions are not followed, your test will have to be rescheduled.  Follow-Up: At [facility], you and your health needs are our priority.  As part of our continuing mission to provide you with exceptional heart care, we have created designated Provider Care Teams.  These Care Teams include your primary Cardiologist (physician) and Advanced Practice Providers (APPs -  Physician Assistants and Nurse Practitioners) who all work together to provide you with the care you need, when you need it.  We recommend signing up for the patient portal called "MyChart".  Sign up information is provided on this After Visit Summary.  MyChart is used to connect with patients for Virtual Visits (Telemedicine).  Patients are able to view lab/test results, encounter notes, upcoming appointments, etc.  Non-urgent messages can be sent to your provider as well.   To learn more about what you can do with MyChart, go to [email or web address].    Your next appointment:   3 week(s)  The format for your next appointment:   In Person  Provider:   Cherlynn Kaiser, MD

## 2020-09-28 NOTE — Telephone Encounter (Signed)
Spoke with patient. Advised patient that Nitro 0.4mg  Sublingual tablets  To use for chest pain have been called in to his pharmacy for pick up. Advised patient of instructions related to NITRO and the importance of the medication. Patient verbalized understanding.

## 2020-10-03 ENCOUNTER — Other Ambulatory Visit: Payer: Self-pay

## 2020-10-03 ENCOUNTER — Ambulatory Visit (INDEPENDENT_AMBULATORY_CARE_PROVIDER_SITE_OTHER): Payer: Medicare Other | Admitting: Family Medicine

## 2020-10-03 ENCOUNTER — Encounter: Payer: Self-pay | Admitting: Family Medicine

## 2020-10-03 VITALS — BP 102/60 | HR 35 | Temp 97.9°F | Ht 70.0 in | Wt 165.0 lb

## 2020-10-03 DIAGNOSIS — I10 Essential (primary) hypertension: Secondary | ICD-10-CM

## 2020-10-03 DIAGNOSIS — I498 Other specified cardiac arrhythmias: Secondary | ICD-10-CM

## 2020-10-03 DIAGNOSIS — N289 Disorder of kidney and ureter, unspecified: Secondary | ICD-10-CM

## 2020-10-03 LAB — BASIC METABOLIC PANEL
BUN: 24 mg/dL — ABNORMAL HIGH (ref 6–23)
CO2: 29 mEq/L (ref 19–32)
Calcium: 9 mg/dL (ref 8.4–10.5)
Chloride: 106 mEq/L (ref 96–112)
Creatinine, Ser: 1.3 mg/dL (ref 0.40–1.50)
GFR: 52.1 mL/min — ABNORMAL LOW (ref >60.00)
Glucose, Bld: 88 mg/dL (ref 70–99)
Potassium: 3.4 mEq/L — ABNORMAL LOW (ref 3.5–5.1)
Sodium: 143 mEq/L (ref 135–145)

## 2020-10-03 NOTE — Progress Notes (Signed)
Rama Sorci T. Adalyne Lovick, MD, [place] at [facility] [place], [address]  Phone: [phone]  FAX: [phone]  Eddie Fox - 79 y.o. male  MRN [id number]  Date of Birth: 02-16-1941  Date: 10/03/2020  PCP: Owens Loffler, MD  Referral: Owens Loffler, MD  Chief Complaint  Patient presents with  . Follow-up Kidneys    This visit occurred during the SARS-CoV-2 public health emergency.  Safety protocols were in place, including screening questions prior to the visit, additional usage of staff PPE, and extensive cleaning of exam room while observing appropriate contact time as indicated for disinfecting solutions.   Subjective:   Eddie Fox is a 79 y.o. very pleasant male patient with Body mass index is 23.68 kg/m. who presents with the following:  He is here to follow-up on his hypertension.  He saw my partner Dr. Glori Bickers approximately 7 to 10 days ago.  He had had some mild intermittent elevation of his blood pressure, and he was noted to have some atrial trigeminy on his EKG, so Dr. Glori Bickers did refer him to cardiology.  His creatinine is increased to 1.7 compared to a baseline of 1.1-1.2.  He is also on hydrochlorothiazide.  He does not recall being specifically dehydrated at the time of his lab work.  He also has been referred to cardiology, and they plan to do a nuclear stress test as well as an echocardiogram.  They recommended that he avoid any kind of physical activity.  Strenuous.  Lab Review:   BMP Latest Ref Rng & Units 09/26/2020 06/21/2020 11/23/2019  Glucose 70 - 99 mg/dL 85 93 91  BUN 6 - 23 mg/dL 25(H) 22 19  Creatinine 0.40 - 1.50 mg/dL 1.66(H) 1.29(H) 1.18  Sodium 135 - 145 mEq/L 142 142 142  Potassium 3.5 - 5.1 mEq/L 3.5 3.9 3.8  Chloride 96 - 112 mEq/L 102 107 105  CO2 19 - 32 mEq/L 31 25 30   Calcium 8.4 - 10.5 mg/dL 9.7 9.2 9.4    No results found  for: HGBA1C Lab Results  Component Value Date   LDLCALC 82 11/23/2019   CREATININE 1.66 (H) 09/26/2020    Call pharmacy and hold HCTZ  140/55  Review of Systems is noted in the HPI, as appropriate  Objective:   BP 102/60 (BP Location: Left Arm, Patient Position: Sitting, Cuff Size: Large)   Pulse (!) 35   Temp 97.9 F (36.6 C)   Ht 5\' 10"  (1.778 m)   Wt 165 lb (74.8 kg)   SpO2 96%   BMI 23.68 kg/m   GEN: No acute distress; alert,appropriate. PULM: Breathing comfortably in no respiratory distress PSYCH: Normally interactive.  CV: He does sound as if he is skipping beats occasionally, but he does not have any murmurs or rubs.  Laboratory and Imaging Data:  Assessment and Plan:     ICD-10-CM   1. Renal insufficiency  I95.1 Basic metabolic panel  2. Essential hypertension  I10   3. Atrial bigeminy  I49.8   4. Other cardiac arrhythmia  I49.8    Total encounter time: 30 minutes. This includes total time spent on the day of encounter.  Chart review, consultation review, medication as well as laboratory review.  Follow-up on some renal insufficiency.  Stop hydrochlorothiazide.  Today his blood pressure is normal.  He also has a cardiac arrhythmia with an abnormal EKG.  He has seen cardiology and  they are going to do an echo as well as a nuclear stress test.  Unfortunately, he has been more active than ideal.  He did mow his grass and do yard work over the weekend.  He is also planning to go deer hunting on the [place] season.  I really discouraged him from doing this since he will have a stress test within 2 weeks.  He plans on doing this.  He thinks that he can do some minimal activity and have some friends help track his tear if he kills 1.  No orders of the defined types were placed in this encounter.  Medications Discontinued During This Encounter  Medication Reason  . hydrochlorothiazide (HYDRODIURIL) 25 MG tablet    Orders Placed This Encounter  Procedures    . Basic metabolic panel    Follow-up: No follow-ups on file.  Signed,  Maud Deed. Renner Sebald, MD   Outpatient Encounter Medications as of 10/03/2020  Medication Sig  . amLODipine (NORVASC) 10 MG tablet TAKE 1 TABLET BY MOUTH  DAILY  . aspirin EC 81 MG tablet Take 1 tablet (81 mg total) by mouth daily. Swallow whole.  . Calcium Carb-Cholecalciferol (CALCIUM+D3 PO) Take 1 tablet by mouth every morning.  . fluticasone (FLONASE) 50 MCG/ACT nasal spray USE 2 SPRAY(S) IN EACH NOSTRIL ONCE DAILY AS NEEDED  . Multiple Vitamins-Minerals (CENTRUM SILVER ULTRA MENS) TABS Take 1 tablet by mouth daily.  . nitroGLYCERIN (NITROSTAT) 0.4 MG SL tablet Place 1 tablet (0.4 mg total) under the tongue every 5 (five) minutes as needed for chest pain.  . simvastatin (ZOCOR) 40 MG tablet TAKE 1 TABLET BY MOUTH ONCE DAILY IN THE EVENING  . [DISCONTINUED] hydrochlorothiazide (HYDRODIURIL) 25 MG tablet TAKE 1 TABLET BY MOUTH  DAILY   No facility-administered encounter medications on file as of 10/03/2020.

## 2020-10-05 ENCOUNTER — Ambulatory Visit: Payer: Medicare Other | Admitting: Family Medicine

## 2020-10-06 ENCOUNTER — Telehealth: Payer: Self-pay | Admitting: Family Medicine

## 2020-10-06 NOTE — Telephone Encounter (Signed)
Patient called needed his medication list due to he is having a stress test and they are needing the list of his medication.

## 2020-10-12 ENCOUNTER — Telehealth (HOSPITAL_COMMUNITY): Payer: Self-pay | Admitting: *Deleted

## 2020-10-12 NOTE — Telephone Encounter (Signed)
Left message on voicemail per DPR in reference to upcoming appointment scheduled on 10/19/2020 at 32 with detailed instructions given per Myocardial Perfusion Study Information Sheet for the test. LM to arrive 15 minutes early, and that it is imperative to arrive on time for appointment to keep from having the test rescheduled. If you need to cancel or reschedule your appointment, please call the office within 24 hours of your appointment. Failure to do so may result in a cancellation of your appointment, and a $50 no show fee. Phone number given for call back for any questions.  No mychart available.Onnika Siebel, [place]

## 2020-10-14 DIAGNOSIS — M79671 Pain in right foot: Secondary | ICD-10-CM | POA: Diagnosis not present

## 2020-10-19 ENCOUNTER — Telehealth: Payer: Self-pay | Admitting: Family Medicine

## 2020-10-19 ENCOUNTER — Ambulatory Visit (HOSPITAL_COMMUNITY): Payer: Medicare Other | Attending: Internal Medicine

## 2020-10-19 ENCOUNTER — Ambulatory Visit (HOSPITAL_BASED_OUTPATIENT_CLINIC_OR_DEPARTMENT_OTHER): Payer: Medicare Other

## 2020-10-19 ENCOUNTER — Other Ambulatory Visit: Payer: Self-pay

## 2020-10-19 DIAGNOSIS — R072 Precordial pain: Secondary | ICD-10-CM

## 2020-10-19 LAB — ECHOCARDIOGRAM COMPLETE
Area-P 1/2: 2.46 cm2
Height: 70 in
P 1/2 time: 539 msec
S' Lateral: 3.7 cm
Weight: 2640 oz

## 2020-10-19 LAB — MYOCARDIAL PERFUSION IMAGING
LV dias vol: 102 mL (ref 62–150)
LV sys vol: 44 mL
Peak HR: 75 {beats}/min
Rest HR: 57 {beats}/min
SDS: 5
SRS: 0
SSS: 5
TID: 1.06

## 2020-10-19 MED ORDER — TECHNETIUM TC 99M TETROFOSMIN IV KIT
10.5000 | PACK | Freq: Once | INTRAVENOUS | Status: AC | PRN
  Administered 2020-10-19: 10.5 via INTRAVENOUS
  Filled 2020-10-19: qty 11

## 2020-10-19 MED ORDER — TECHNETIUM TC 99M TETROFOSMIN IV KIT
31.6000 | PACK | Freq: Once | INTRAVENOUS | Status: AC | PRN
  Administered 2020-10-19: 31.6 via INTRAVENOUS
  Filled 2020-10-19: qty 32

## 2020-10-19 MED ORDER — REGADENOSON 0.4 MG/5ML IV SOLN
0.4000 mg | Freq: Once | INTRAVENOUS | Status: AC
  Administered 2020-10-19: 0.4 mg via INTRAVENOUS

## 2020-10-20 NOTE — Telephone Encounter (Signed)
Please schedule MWV with nurse and CPE with Dr. Lorelei Pont for December.

## 2020-10-21 ENCOUNTER — Encounter: Payer: Self-pay | Admitting: Internal Medicine

## 2020-10-21 ENCOUNTER — Ambulatory Visit: Payer: Medicare Other | Admitting: Internal Medicine

## 2020-10-21 ENCOUNTER — Other Ambulatory Visit: Payer: Self-pay

## 2020-10-21 VITALS — BP 120/50 | HR 58 | Ht 70.0 in | Wt 163.2 lb

## 2020-10-21 DIAGNOSIS — I10 Essential (primary) hypertension: Secondary | ICD-10-CM | POA: Diagnosis not present

## 2020-10-21 DIAGNOSIS — E7800 Pure hypercholesterolemia, unspecified: Secondary | ICD-10-CM

## 2020-10-21 DIAGNOSIS — Z01812 Encounter for preprocedural laboratory examination: Secondary | ICD-10-CM

## 2020-10-21 DIAGNOSIS — Z79899 Other long term (current) drug therapy: Secondary | ICD-10-CM

## 2020-10-21 DIAGNOSIS — R072 Precordial pain: Secondary | ICD-10-CM

## 2020-10-21 DIAGNOSIS — I498 Other specified cardiac arrhythmias: Secondary | ICD-10-CM

## 2020-10-21 DIAGNOSIS — R9439 Abnormal result of other cardiovascular function study: Secondary | ICD-10-CM | POA: Diagnosis not present

## 2020-10-21 NOTE — Patient Instructions (Addendum)
Medication Instructions:  No Changes In Medications at this time.  *If you need a refill on your cardiac medications before your next appointment, please call your pharmacy*  Lab Work: BMET AND CBC  prior to Heart Cath- please return to office for this blood work on November 22nd   You will need a COVID-19  test prior to your procedure. You are scheduled for November 30th  At 9:00 AM. This is a Drive Up Visit at [address] [place], [place] [address]. Someone will direct you to the appropriate testing line. Stay in your car and someone will be with you shortly.  If you have labs (blood work) drawn today and your tests are completely normal, you will receive your results only by: Marland Kitchen MyChart Message (if you have MyChart) OR . A paper copy in the mail If you have any lab test that is abnormal or we need to change your treatment, we will call you to review the results.  Testing/Procedures: Left Heart Cath- please see Instruction sheet below.   Follow-Up: At [facility], you and your health needs are our priority.  As part of our continuing mission to provide you with exceptional heart care, we have created designated Provider Care Teams.  These Care Teams include your primary Cardiologist (physician) and Advanced Practice Providers (APPs -  Physician Assistants and Nurse Practitioners) who all work together to provide you with the care you need, when you need it.  We recommend signing up for the patient portal called "MyChart".  Sign up information is provided on this After Visit Summary.  MyChart is used to connect with patients for Virtual Visits (Telemedicine).  Patients are able to view lab/test results, encounter notes, upcoming appointments, etc.  Non-urgent messages can be sent to your provider as well.   To learn more about what you can do with MyChart, go to [email or web address].    Your next appointment:   1 week(s) after Heart Cath 12/3  The format for your next  appointment:   In Person  Provider:   Cherlynn Kaiser, MD  Other Instructions    [place] [address] Dept: [phone] Loc: [place]  10/21/2020  You are scheduled for a Cardiac Catheterization on Friday, December 3 with Dr. Shelva Majestic.  1. Please arrive at the [facility] (Main Entrance A) at [facility]: [address] [place], [place] [address] at 5:30 AM (This time is two hours before your procedure to ensure your preparation). Free valet parking service is available.   Special note: Every effort is made to have your procedure done on time. Please understand that emergencies sometimes delay scheduled procedures.  2. Diet: Do not eat solid foods after midnight.  The patient may have clear liquids until 5am upon the day of the procedure.  3. Labs: You will need to have blood drawn on Monday, November 22 at [place]  Open: 8am - 5pm (Lunch 12:30 - 1:30)   Phone: [phone]. You do not need to be fasting.  4. Medication instructions in preparation for your procedure:   Contrast Allergy: No   On the morning of your procedure, take your Aspirin and any morning medicines NOT listed above.  You may use sips of water.  5. Plan for one night stay--bring personal belongings. 6. Bring a current list of your medications and current insurance cards. 7.  You MUST have a responsible person to drive you home. 8. Someone MUST be with you the first 24 hours after you arrive home or your discharge will be delayed. 9. Please wear clothes that are easy to get on and off and wear slip-on shoes.  Thank you for allowing [place] to care for you!   -- [facility]

## 2020-10-21 NOTE — H&P (View-Only) (Signed)
Cardiology Office Note:    Date:  10/21/2020   ID:  Eddie Fox, DOB [birth year], MRN [id number]  PCP:  Owens Loffler, MD  Cardiologist:  Elouise Munroe, MD  Electrophysiologist:  None   Referring MD: Owens Loffler, MD   Chief Complaint/Reason for Referral: Abnormal stress test  History of Present Illness:    Eddie Fox is a 79 y.o. male with a history of HLD, NASH, HTN who presents for evaluation of chest discomfort and PACs. History from last visit is as follows "Notes 2 weeks of substernal and left sided "funny feeling" in chest while walking. Will resolve after 30 minutes of rest. Also notes having a headache. On Sunday he felt very bad with these symptoms. Denies SOB, palpitations, or syncope. Occasional dizziness and mildly elevated BP. Noted to have atrial bigeminy on ECG at PCP office."   Nuc stress showed inferolateral infarct, no ischemia. Echo showed normal EF and no significant regionals.   We discussed that in the setting of abnormal stress test and chest pain while walking, need to define coronary anatomy and consider stent in setting of stable angina. He has had some continued symptoms since our last visit. On ASA, no bleeding. Continues to hunt avidly and I have requested that he not hunt alone given concern for angina. He keeps nitroglycerin with him. No significant SOB or palpitations. No syncope.   Past Medical History:  Diagnosis Date  . Benign neoplasm of colon   . ED (erectile dysfunction)   . IBS (irritable bowel syndrome)   . Melanoma ([place])    history of  . Other and unspecified hyperlipidemia   . Other chronic nonalcoholic liver disease   . PONV (postoperative nausea and vomiting)   . Unspecified essential hypertension   . Unspecified vitamin D deficiency     Past Surgical History:  Procedure Laterality Date  . AMPUTATION     distal phalanx of right thumb- traumatic  . CHOLECYSTECTOMY  10/03/09   lap  . LAMINECTOMY  1995   L 3-T11  Harrington rods in back  . LAPAROSCOPIC APPENDECTOMY N/A 05/16/2016   Procedure: APPENDECTOMY LAPAROSCOPIC;  Surgeon: Greer Pickerel, MD;  Location: [facility];  Service: General;  Laterality: N/A;  . [place]   right upper abd, Dr. Ronnald Fox  . METATARSAL HEAD EXCISION Right 06/23/2020   Procedure: RIGHT 5TH METATARSAL OSTEOTOMY WITH BUNIONETTE EXCISION, OPEN REDUCTION OF FIFTH METATARSAL PHALANGEAL JOINT, FIFTH METATARSAL PHALANGEAL CAPSULOTOMY, BURSECTOMY;  Surgeon: Erle Crocker, MD;  Location: [facility];  Service: Orthopedics;  Laterality: Right;  LENGTH OF SURGERY: 1.5 HOURS  . ORIF ANKLE FRACTURE  1967    Current Medications: Current Meds  Medication Sig  . amLODipine (NORVASC) 10 MG tablet TAKE 1 TABLET BY MOUTH  DAILY  . aspirin EC 81 MG tablet Take 1 tablet (81 mg total) by mouth daily. Swallow whole.  . Calcium Carb-Cholecalciferol (CALCIUM+D3 PO) Take 1 tablet by mouth every morning.  . Multiple Vitamins-Minerals (CENTRUM SILVER ULTRA MENS) TABS Take 1 tablet by mouth daily.  . simvastatin (ZOCOR) 40 MG tablet TAKE 1 TABLET BY MOUTH ONCE DAILY IN THE EVENING     Allergies:   Hydrocodone and Pneumococcal vaccine   Social History   Tobacco Use  . Smoking status: Never Smoker  . Smokeless tobacco: Never Used  Vaping Use  . Vaping Use: Never used  Substance Use Topics  . Alcohol use: No  . Drug use: No  Family History: The patient's family history includes Diabetes in his mother; Heart disease in his father and mother; Hypertension in his father, mother, sister, and sister; Stroke in his mother. There is no history of Depression, Alcohol abuse, or Drug abuse.  ROS:   Please see the history of present illness.    All other systems reviewed and are negative.  EKGs/Labs/Other Studies Reviewed:    The following studies were reviewed today:   Recent Labs: 09/26/2020: ALT 12; Hemoglobin 15.0; Platelets 204.0; TSH 2.98 10/03/2020: BUN 24;  Creatinine, Ser 1.30; Potassium 3.4; Sodium 143  Recent Lipid Panel    Component Value Date/Time   CHOL 144 11/23/2019 1151   TRIG 105.0 11/23/2019 1151   HDL 41.00 11/23/2019 1151   CHOLHDL 4 11/23/2019 1151   VLDL 21.0 11/23/2019 1151   LDLCALC 82 11/23/2019 1151   LDLDIRECT 171.9 07/08/2007 1044    Physical Exam:    VS:  BP (!) 120/50 (BP Location: Left Arm, Patient Position: Sitting)   Pulse (!) 58   Ht 5\' 10"  (1.778 m)   Wt 163 lb 3.2 oz (74 kg)   SpO2 98%   BMI 23.42 kg/m     Wt Readings from Last 5 Encounters:  10/21/20 163 lb 3.2 oz (74 kg)  10/19/20 165 lb (74.8 kg)  10/03/20 165 lb (74.8 kg)  09/28/20 160 lb (72.6 kg)  09/26/20 161 lb 1 oz (73.1 kg)    Constitutional: No acute distress Cardiovascular: regular rhythm, normal rate, no murmurs. S1 and S2 normal. Radial pulses normal bilaterally. No jugular venous distention.  Respiratory: clear to auscultation bilaterally GI : normal bowel sounds, soft and nontender. No distention.   MSK: extremities warm, well perfused. No edema.  NEURO: grossly nonfocal exam, moves all extremities. PSYCH: alert and oriented x 3, normal mood and affect.   ASSESSMENT:    1. Abnormal cardiovascular stress test   2. Pre-procedure lab exam   3. Precordial pain   4. Atrial bigeminy   5. Essential hypertension   6. Pure hypercholesterolemia   7. 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 Abnormal stress test, discussed in shared decision making going forward with LHC. Some symptoms occurring at rest, concern for angina progressing to unstable symptoms.  INFORMED CONSENT: I have reviewed the risks, indications, and alternatives to cardiac catheterization, possible angioplasty, and stenting with the patient. Risks include but are not limited to bleeding, infection, vascular injury, stroke, myocardial infection, arrhythmia, kidney injury, radiation-related injury in the  case of prolonged fluoroscopy use, emergency cardiac surgery, and death. The patient understands the risks of serious complication is 1-2 in 9892 with diagnostic cardiac cath and 1-2% or less with angioplasty/stenting.   HTN - continue amlodipine, BP well controlled.   HLD - continue simvastatin for now. May adjust to high intensity after cath. LDL 82.  Total time of encounter: 30 minutes total time of encounter, including 20 minutes spent in face-to-face patient care on the date of this encounter. This time includes coordination of care and counseling regarding above mentioned problem list. Remainder of non-face-to-face time involved reviewing chart documents/testing relevant to the patient encounter and documentation in the medical record. I have independently reviewed documentation from referring provider.   Cherlynn Kaiser, MD [place]  [facility]    Medication Adjustments/Labs and Tests Ordered: Current medicines are reviewed at length with the patient today.  Concerns regarding medicines are outlined above.  Orders Placed This Encounter  Procedures  . Basic metabolic panel  . CBC    No orders of the defined types were placed in this encounter.   Patient Instructions  Medication Instructions:  No Changes In Medications at this time.  *If you need a refill on your cardiac medications before your next appointment, please call your pharmacy*  Lab Work: BMET AND CBC  prior to Heart Cath- please return to office for this blood work on November 22nd   You will need a COVID-19  test prior to your procedure. You are scheduled for November 30th  At 9:00 AM. This is a Drive Up Visit at [address] [place], [place] [address]. Someone will direct you to the appropriate testing line. Stay in your car and someone will be with you shortly.  If you have labs (blood work) drawn today and your tests are completely normal, you will receive your results only by: Marland Kitchen MyChart Message  (if you have MyChart) OR . A paper copy in the mail If you have any lab test that is abnormal or we need to change your treatment, we will call you to review the results.  Testing/Procedures: Left Heart Cath- please see Instruction sheet below.   Follow-Up: At [facility], you and your health needs are our priority.  As part of our continuing mission to provide you with exceptional heart care, we have created designated Provider Care Teams.  These Care Teams include your primary Cardiologist (physician) and Advanced Practice Providers (APPs -  Physician Assistants and Nurse Practitioners) who all work together to provide you with the care you need, when you need it.  We recommend signing up for the patient portal called "MyChart".  Sign up information is provided on this After Visit Summary.  MyChart is used to connect with patients for Virtual Visits (Telemedicine).  Patients are able to view lab/test results, encounter notes, upcoming appointments, etc.  Non-urgent messages can be sent to your provider as well.   To learn more about what you can do with MyChart, go to [email or web address].    Your next appointment:   1 week(s) after Heart Cath 12/3  The format for your next appointment:   In Person  Provider:   Cherlynn Kaiser, MD  Other Instructions    [place] [address] Dept: [phone] Loc: [place]  10/21/2020  You are scheduled for a Cardiac Catheterization on Friday, December 3 with Dr. Shelva Majestic.  1. Please arrive at the [facility] (Main Entrance A) at [facility]: [address] [place], [place] [address] at 5:30 AM (This time is two hours before your procedure to ensure your preparation). Free valet parking service is available.   Special note: Every effort is made to have your procedure done on time. Please  understand that emergencies sometimes delay scheduled procedures.  2. Diet: Do not eat solid foods after midnight.  The patient may have clear liquids until 5am upon the day of the procedure.  3. Labs: You will need to have blood drawn on Monday, November 22 at [place]  Open: 8am - 5pm (Lunch 12:30 - 1:30)   Phone: [phone]. You do not need to be fasting.  4. Medication instructions in preparation for your procedure:   Contrast Allergy: No   On the morning of your procedure, take your Aspirin and any  morning medicines NOT listed above.  You may use sips of water.  5. Plan for one night stay--bring personal belongings. 6. Bring a current list of your medications and current insurance cards. 7. You MUST have a responsible person to drive you home. 8. Someone MUST be with you the first 24 hours after you arrive home or your discharge will be delayed. 9. Please wear clothes that are easy to get on and off and wear slip-on shoes.  Thank you for allowing [place] to care for you!   -- [facility]

## 2020-10-21 NOTE — Telephone Encounter (Signed)
Left message asking pt to call office  °

## 2020-10-21 NOTE — Progress Notes (Signed)
Cardiology Office Note:    Date:  10/21/2020   ID:  Eddie Fox, DOB 04-13-1941, MRN [id number]  PCP:  Owens Loffler, MD  Cardiologist:  Elouise Munroe, MD  Electrophysiologist:  None   Referring MD: Owens Loffler, MD   Chief Complaint/Reason for Referral: Abnormal stress test  History of Present Illness:    Eddie Fox is a 79 y.o. male with a history of HLD, NASH, HTN who presents for evaluation of chest discomfort and PACs. History from last visit is as follows "Notes 2 weeks of substernal and left sided "funny feeling" in chest while walking. Will resolve after 30 minutes of rest. Also notes having a headache. On Sunday he felt very bad with these symptoms. Denies SOB, palpitations, or syncope. Occasional dizziness and mildly elevated BP. Noted to have atrial bigeminy on ECG at PCP office."   Nuc stress showed inferolateral infarct, no ischemia. Echo showed normal EF and no significant regionals.   We discussed that in the setting of abnormal stress test and chest pain while walking, need to define coronary anatomy and consider stent in setting of stable angina. He has had some continued symptoms since our last visit. On ASA, no bleeding. Continues to hunt avidly and I have requested that he not hunt alone given concern for angina. He keeps nitroglycerin with him. No significant SOB or palpitations. No syncope.   Past Medical History:  Diagnosis Date  . Benign neoplasm of colon   . ED (erectile dysfunction)   . IBS (irritable bowel syndrome)   . Melanoma ([place])    history of  . Other and unspecified hyperlipidemia   . Other chronic nonalcoholic liver disease   . PONV (postoperative nausea and vomiting)   . Unspecified essential hypertension   . Unspecified vitamin D deficiency     Past Surgical History:  Procedure Laterality Date  . AMPUTATION     distal phalanx of right thumb- traumatic  . CHOLECYSTECTOMY  10/03/09   lap  . LAMINECTOMY  1995   L 3-T11  Harrington rods in back  . LAPAROSCOPIC APPENDECTOMY N/A 05/16/2016   Procedure: APPENDECTOMY LAPAROSCOPIC;  Surgeon: Greer Pickerel, MD;  Location: [facility];  Service: General;  Laterality: N/A;  . [place]   right upper abd, Dr. Ronnald Ramp  . METATARSAL HEAD EXCISION Right 06/23/2020   Procedure: RIGHT 5TH METATARSAL OSTEOTOMY WITH BUNIONETTE EXCISION, OPEN REDUCTION OF FIFTH METATARSAL PHALANGEAL JOINT, FIFTH METATARSAL PHALANGEAL CAPSULOTOMY, BURSECTOMY;  Surgeon: Erle Crocker, MD;  Location: [facility];  Service: Orthopedics;  Laterality: Right;  LENGTH OF SURGERY: 1.5 HOURS  . ORIF ANKLE FRACTURE  1967    Current Medications: Current Meds  Medication Sig  . amLODipine (NORVASC) 10 MG tablet TAKE 1 TABLET BY MOUTH  DAILY  . aspirin EC 81 MG tablet Take 1 tablet (81 mg total) by mouth daily. Swallow whole.  . Calcium Carb-Cholecalciferol (CALCIUM+D3 PO) Take 1 tablet by mouth every morning.  . Multiple Vitamins-Minerals (CENTRUM SILVER ULTRA MENS) TABS Take 1 tablet by mouth daily.  . simvastatin (ZOCOR) 40 MG tablet TAKE 1 TABLET BY MOUTH ONCE DAILY IN THE EVENING     Allergies:   Hydrocodone and Pneumococcal vaccine   Social History   Tobacco Use  . Smoking status: Never Smoker  . Smokeless tobacco: Never Used  Vaping Use  . Vaping Use: Never used  Substance Use Topics  . Alcohol use: No  . Drug use: No  Family History: The patient's family history includes Diabetes in his mother; Heart disease in his father and mother; Hypertension in his father, mother, sister, and sister; Stroke in his mother. There is no history of Depression, Alcohol abuse, or Drug abuse.  ROS:   Please see the history of present illness.    All other systems reviewed and are negative.  EKGs/Labs/Other Studies Reviewed:    The following studies were reviewed today:   Recent Labs: 09/26/2020: ALT 12; Hemoglobin 15.0; Platelets 204.0; TSH 2.98 10/03/2020: BUN 24;  Creatinine, Ser 1.30; Potassium 3.4; Sodium 143  Recent Lipid Panel    Component Value Date/Time   CHOL 144 11/23/2019 1151   TRIG 105.0 11/23/2019 1151   HDL 41.00 11/23/2019 1151   CHOLHDL 4 11/23/2019 1151   VLDL 21.0 11/23/2019 1151   LDLCALC 82 11/23/2019 1151   LDLDIRECT 171.9 07/08/2007 1044    Physical Exam:    VS:  BP (!) 120/50 (BP Location: Left Arm, Patient Position: Sitting)   Pulse (!) 58   Ht 5\' 10"  (1.778 m)   Wt 163 lb 3.2 oz (74 kg)   SpO2 98%   BMI 23.42 kg/m     Wt Readings from Last 5 Encounters:  10/21/20 163 lb 3.2 oz (74 kg)  10/19/20 165 lb (74.8 kg)  10/03/20 165 lb (74.8 kg)  09/28/20 160 lb (72.6 kg)  09/26/20 161 lb 1 oz (73.1 kg)    Constitutional: No acute distress Cardiovascular: regular rhythm, normal rate, no murmurs. S1 and S2 normal. Radial pulses normal bilaterally. No jugular venous distention.  Respiratory: clear to auscultation bilaterally GI : normal bowel sounds, soft and nontender. No distention.   MSK: extremities warm, well perfused. No edema.  NEURO: grossly nonfocal exam, moves all extremities. PSYCH: alert and oriented x 3, normal mood and affect.   ASSESSMENT:    1. Abnormal cardiovascular stress test   2. Pre-procedure lab exam   3. Precordial pain   4. Atrial bigeminy   5. Essential hypertension   6. Pure hypercholesterolemia   7. 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 Abnormal stress test, discussed in shared decision making going forward with LHC. Some symptoms occurring at rest, concern for angina progressing to unstable symptoms.  INFORMED CONSENT: I have reviewed the risks, indications, and alternatives to cardiac catheterization, possible angioplasty, and stenting with the patient. Risks include but are not limited to bleeding, infection, vascular injury, stroke, myocardial infection, arrhythmia, kidney injury, radiation-related injury in the  case of prolonged fluoroscopy use, emergency cardiac surgery, and death. The patient understands the risks of serious complication is 1-2 in 1610 with diagnostic cardiac cath and 1-2% or less with angioplasty/stenting.   HTN - continue amlodipine, BP well controlled.   HLD - continue simvastatin for now. May adjust to high intensity after cath. LDL 82.  Total time of encounter: 30 minutes total time of encounter, including 20 minutes spent in face-to-face patient care on the date of this encounter. This time includes coordination of care and counseling regarding above mentioned problem list. Remainder of non-face-to-face time involved reviewing chart documents/testing relevant to the patient encounter and documentation in the medical record. I have independently reviewed documentation from referring provider.   Cherlynn Kaiser, MD [place]  [facility]    Medication Adjustments/Labs and Tests Ordered: Current medicines are reviewed at length with the patient today.  Concerns regarding medicines are outlined above.  Orders Placed This Encounter  Procedures  . Basic metabolic panel  . CBC    No orders of the defined types were placed in this encounter.   Patient Instructions  Medication Instructions:  No Changes In Medications at this time.  *If you need a refill on your cardiac medications before your next appointment, please call your pharmacy*  Lab Work: BMET AND CBC  prior to Heart Cath- please return to office for this blood work on November 22nd   You will need a COVID-19  test prior to your procedure. You are scheduled for November 30th  At 9:00 AM. This is a Drive Up Visit at [address] [place], [place] [address]. Someone will direct you to the appropriate testing line. Stay in your car and someone will be with you shortly.  If you have labs (blood work) drawn today and your tests are completely normal, you will receive your results only by: Marland Kitchen MyChart Message  (if you have MyChart) OR . A paper copy in the mail If you have any lab test that is abnormal or we need to change your treatment, we will call you to review the results.  Testing/Procedures: Left Heart Cath- please see Instruction sheet below.   Follow-Up: At [facility], you and your health needs are our priority.  As part of our continuing mission to provide you with exceptional heart care, we have created designated Provider Care Teams.  These Care Teams include your primary Cardiologist (physician) and Advanced Practice Providers (APPs -  Physician Assistants and Nurse Practitioners) who all work together to provide you with the care you need, when you need it.  We recommend signing up for the patient portal called "MyChart".  Sign up information is provided on this After Visit Summary.  MyChart is used to connect with patients for Virtual Visits (Telemedicine).  Patients are able to view lab/test results, encounter notes, upcoming appointments, etc.  Non-urgent messages can be sent to your provider as well.   To learn more about what you can do with MyChart, go to [email or web address].    Your next appointment:   1 week(s) after Heart Cath 12/3  The format for your next appointment:   In Person  Provider:   Cherlynn Kaiser, MD  Other Instructions    [place] [address] Dept: [phone] Loc: [place]  10/21/2020  You are scheduled for a Cardiac Catheterization on Friday, December 3 with Dr. Shelva Majestic.  1. Please arrive at the [facility] (Main Entrance A) at [facility]: [address] [place], [place] [address] at 5:30 AM (This time is two hours before your procedure to ensure your preparation). Free valet parking service is available.   Special note: Every effort is made to have your procedure done on time. Please  understand that emergencies sometimes delay scheduled procedures.  2. Diet: Do not eat solid foods after midnight.  The patient may have clear liquids until 5am upon the day of the procedure.  3. Labs: You will need to have blood drawn on Monday, November 22 at [place]  Open: 8am - 5pm (Lunch 12:30 - 1:30)   Phone: [phone]. You do not need to be fasting.  4. Medication instructions in preparation for your procedure:   Contrast Allergy: No   On the morning of your procedure, take your Aspirin and any  morning medicines NOT listed above.  You may use sips of water.  5. Plan for one night stay--bring personal belongings. 6. Bring a current list of your medications and current insurance cards. 7. You MUST have a responsible person to drive you home. 8. Someone MUST be with you the first 24 hours after you arrive home or your discharge will be delayed. 9. Please wear clothes that are easy to get on and off and wear slip-on shoes.  Thank you for allowing [place] to care for you!   -- [place] [facility]

## 2020-10-27 ENCOUNTER — Telehealth: Payer: Self-pay | Admitting: Family Medicine

## 2020-10-27 ENCOUNTER — Telehealth: Payer: Self-pay | Admitting: Internal Medicine

## 2020-10-27 NOTE — Telephone Encounter (Signed)
Spoke with patient who states that he has a cold with excess mucous causing him to cough. Patient states that it is worse at night and wanted to see if Mucinex DM was a safe medication for him to take given his cardiac medications and cardiac history as patient has upcoming left heart cath scheduled 11/18/20.  Patient also wanted to know if an antibiotic was needed. Advised patient to follow up with his primary care regarding this issue and that I would forward message to pharmacy to have them review medications and history, and see if they have any recommendations and would forward to Dr. Margaretann Loveless to make her ware. Patient verbalized understanding.Marland Kitchen

## 2020-10-27 NOTE — Telephone Encounter (Signed)
Pt called in wanted to know if he can get an antiboitic he has mucus in his nose

## 2020-10-27 NOTE — Telephone Encounter (Signed)
Spoke with patient, advised him that per pharmacy it was okay for him to take the Mucinex DM. Patient verbalized understanding. Patient stated he is going to call his PCP to ask for any recommendations regarding his cold symptoms. Advised patient to call back to office if he has any other questions or concerns. Patient verbalized understanding.

## 2020-10-27 NOTE — Telephone Encounter (Signed)
Spoke with patient, he has not tried any over the counter medication at this time.  Advised he should try those, fluids, nasal spray and humidifier.  If this does not help his symptoms he will then contact the office for an appointment.  Denies covid exposure at this time.

## 2020-10-27 NOTE — Telephone Encounter (Signed)
Ok to take Mucinex DM

## 2020-10-27 NOTE — Telephone Encounter (Signed)
Pt c/o medication issue:  1. Name of Medication: Mucinex DM  2. How are you currently taking this medication (dosage and times per day)? n/a  3. Are you having a reaction (difficulty breathing--STAT)? no  4. What is your medication issue? Patient has a bad cough and wants to know if he can take this without it interfering with his cardiac Medications

## 2020-11-07 DIAGNOSIS — I10 Essential (primary) hypertension: Secondary | ICD-10-CM | POA: Diagnosis not present

## 2020-11-07 DIAGNOSIS — R072 Precordial pain: Secondary | ICD-10-CM | POA: Diagnosis not present

## 2020-11-07 DIAGNOSIS — I498 Other specified cardiac arrhythmias: Secondary | ICD-10-CM | POA: Diagnosis not present

## 2020-11-07 DIAGNOSIS — R9439 Abnormal result of other cardiovascular function study: Secondary | ICD-10-CM | POA: Diagnosis not present

## 2020-11-07 DIAGNOSIS — Z01812 Encounter for preprocedural laboratory examination: Secondary | ICD-10-CM | POA: Diagnosis not present

## 2020-11-07 LAB — BASIC METABOLIC PANEL
BUN/Creatinine Ratio: 17 (ref 10–24)
BUN: 18 mg/dL (ref 8–27)
CO2: 22 mmol/L (ref 20–29)
Calcium: 9.3 mg/dL (ref 8.6–10.2)
Chloride: 107 mmol/L — ABNORMAL HIGH (ref 96–106)
Creatinine, Ser: 1.05 mg/dL (ref 0.76–1.27)
GFR calc Af Amer: 78 mL/min/{1.73_m2} (ref >59)
GFR calc non Af Amer: 68 mL/min/{1.73_m2} (ref >59)
Glucose: 80 mg/dL (ref 65–99)
Potassium: 4.7 mmol/L (ref 3.5–5.2)
Sodium: 143 mmol/L (ref 134–144)

## 2020-11-07 LAB — CBC
Hematocrit: 42.5 % (ref 37.5–51.0)
Hemoglobin: 14.7 g/dL (ref 13.0–17.7)
MCH: 32.5 pg (ref 26.6–33.0)
MCHC: 34.6 g/dL (ref 31.5–35.7)
MCV: 94 fL (ref 79–97)
Platelets: 250 10*3/uL (ref 150–450)
RBC: 4.52 x10E6/uL (ref 4.14–5.80)
RDW: 12 % (ref 11.6–15.4)
WBC: 8 10*3/uL (ref 3.4–10.8)

## 2020-11-15 ENCOUNTER — Other Ambulatory Visit (HOSPITAL_COMMUNITY)
Admission: RE | Admit: 2020-11-15 | Discharge: 2020-11-15 | Disposition: A | Discharge status: Home / Self Care | Payer: Medicare Other | Source: Ambulatory Visit | Attending: Cardiovascular Disease | Admitting: Cardiovascular Disease

## 2020-11-15 DIAGNOSIS — I083 Combined rheumatic disorders of mitral, aortic and tricuspid valves: Secondary | ICD-10-CM | POA: Diagnosis not present

## 2020-11-15 DIAGNOSIS — Z79899 Other long term (current) drug therapy: Secondary | ICD-10-CM | POA: Diagnosis not present

## 2020-11-15 DIAGNOSIS — E785 Hyperlipidemia, unspecified: Secondary | ICD-10-CM | POA: Diagnosis not present

## 2020-11-15 DIAGNOSIS — D696 Thrombocytopenia, unspecified: Secondary | ICD-10-CM | POA: Diagnosis not present

## 2020-11-15 DIAGNOSIS — E8770 Fluid overload, unspecified: Secondary | ICD-10-CM | POA: Diagnosis not present

## 2020-11-15 DIAGNOSIS — Z89011 Acquired absence of right thumb: Secondary | ICD-10-CM | POA: Diagnosis not present

## 2020-11-15 DIAGNOSIS — Z20822 Contact with and (suspected) exposure to covid-19: Secondary | ICD-10-CM | POA: Insufficient documentation

## 2020-11-15 DIAGNOSIS — I25110 Atherosclerotic heart disease of native coronary artery with unstable angina pectoris: Secondary | ICD-10-CM | POA: Diagnosis not present

## 2020-11-15 DIAGNOSIS — K589 Irritable bowel syndrome without diarrhea: Secondary | ICD-10-CM | POA: Diagnosis not present

## 2020-11-15 DIAGNOSIS — Z01812 Encounter for preprocedural laboratory examination: Secondary | ICD-10-CM | POA: Insufficient documentation

## 2020-11-15 DIAGNOSIS — Z01810 Encounter for preprocedural cardiovascular examination: Secondary | ICD-10-CM | POA: Diagnosis not present

## 2020-11-15 DIAGNOSIS — I25118 Atherosclerotic heart disease of native coronary artery with other forms of angina pectoris: Secondary | ICD-10-CM | POA: Diagnosis not present

## 2020-11-15 DIAGNOSIS — R001 Bradycardia, unspecified: Secondary | ICD-10-CM | POA: Diagnosis not present

## 2020-11-15 DIAGNOSIS — Z9049 Acquired absence of other specified parts of digestive tract: Secondary | ICD-10-CM | POA: Diagnosis not present

## 2020-11-15 DIAGNOSIS — I25119 Atherosclerotic heart disease of native coronary artery with unspecified angina pectoris: Secondary | ICD-10-CM | POA: Diagnosis not present

## 2020-11-15 DIAGNOSIS — K7581 Nonalcoholic steatohepatitis (NASH): Secondary | ICD-10-CM | POA: Diagnosis not present

## 2020-11-15 DIAGNOSIS — R9439 Abnormal result of other cardiovascular function study: Secondary | ICD-10-CM | POA: Diagnosis not present

## 2020-11-15 DIAGNOSIS — Z85820 Personal history of malignant melanoma of skin: Secondary | ICD-10-CM | POA: Diagnosis not present

## 2020-11-15 DIAGNOSIS — I517 Cardiomegaly: Secondary | ICD-10-CM | POA: Diagnosis not present

## 2020-11-15 DIAGNOSIS — E559 Vitamin D deficiency, unspecified: Secondary | ICD-10-CM | POA: Diagnosis not present

## 2020-11-15 DIAGNOSIS — I2510 Atherosclerotic heart disease of native coronary artery without angina pectoris: Secondary | ICD-10-CM | POA: Diagnosis not present

## 2020-11-15 DIAGNOSIS — Z7982 Long term (current) use of aspirin: Secondary | ICD-10-CM | POA: Diagnosis not present

## 2020-11-15 DIAGNOSIS — J811 Chronic pulmonary edema: Secondary | ICD-10-CM | POA: Diagnosis not present

## 2020-11-15 DIAGNOSIS — I10 Essential (primary) hypertension: Secondary | ICD-10-CM | POA: Diagnosis not present

## 2020-11-15 DIAGNOSIS — H9190 Unspecified hearing loss, unspecified ear: Secondary | ICD-10-CM | POA: Diagnosis not present

## 2020-11-15 DIAGNOSIS — J9811 Atelectasis: Secondary | ICD-10-CM | POA: Diagnosis not present

## 2020-11-15 DIAGNOSIS — Z8249 Family history of ischemic heart disease and other diseases of the circulatory system: Secondary | ICD-10-CM | POA: Diagnosis not present

## 2020-11-15 DIAGNOSIS — D62 Acute posthemorrhagic anemia: Secondary | ICD-10-CM | POA: Diagnosis not present

## 2020-11-15 DIAGNOSIS — J90 Pleural effusion, not elsewhere classified: Secondary | ICD-10-CM | POA: Diagnosis not present

## 2020-11-15 LAB — SARS CORONAVIRUS 2 (TAT 6-24 HRS): SARS Coronavirus 2: NEGATIVE

## 2020-11-17 ENCOUNTER — Telehealth: Payer: Self-pay | Admitting: *Deleted

## 2020-11-17 NOTE — Telephone Encounter (Signed)
Pt contacted pre-catheterization scheduled at [facility] for: Friday November 18, 2020 7:30 AM Verified arrival time and place: [facility]) at: 5:30 AM   No solid food after midnight prior to cath, clear liquids until 5 AM day of procedure.  AM meds can be  taken pre-cath with sips of water including: ASA 81 mg   Confirmed patient has responsible adult to drive home post procedure and be with patient first 24 hours after arriving home: yes  You are allowed ONE visitor in the waiting room during the time you are at the hospital for your procedure. Both you and your visitor must wear a mask once you enter the hospital.       COVID-19 Pre-Screening Questions:  . In the past 14 days have you had any symptoms concerning for COVID-19 infection (fever, chills, cough, or new shortness of breath)? no . In the past 14 days have you been around anyone with known Covid 19? no   Reviewed procedure/mask/visitor instructions, COVID-19 questions with patient.

## 2020-11-18 ENCOUNTER — Other Ambulatory Visit: Payer: Self-pay

## 2020-11-18 ENCOUNTER — Inpatient Hospital Stay (HOSPITAL_COMMUNITY): Payer: Medicare Other

## 2020-11-18 ENCOUNTER — Inpatient Hospital Stay (HOSPITAL_COMMUNITY)
Admission: AD | Admit: 2020-11-18 | Discharge: 2020-11-25 | DRG: 234 | Disposition: A | Discharge status: Home Health | Payer: Medicare Other | Attending: Thoracic Surgery (Cardiothoracic Vascular Surgery) | Admitting: Thoracic Surgery (Cardiothoracic Vascular Surgery)

## 2020-11-18 ENCOUNTER — Encounter (HOSPITAL_COMMUNITY): Payer: Self-pay | Admitting: Cardiovascular Disease

## 2020-11-18 ENCOUNTER — Encounter (HOSPITAL_COMMUNITY)
Admission: AD | Disposition: A | Discharge status: Home Health | Payer: Self-pay | Source: Home / Self Care | Attending: Thoracic Surgery (Cardiothoracic Vascular Surgery)

## 2020-11-18 DIAGNOSIS — K7581 Nonalcoholic steatohepatitis (NASH): Secondary | ICD-10-CM | POA: Diagnosis present

## 2020-11-18 DIAGNOSIS — R001 Bradycardia, unspecified: Secondary | ICD-10-CM | POA: Diagnosis present

## 2020-11-18 DIAGNOSIS — Z89011 Acquired absence of right thumb: Secondary | ICD-10-CM

## 2020-11-18 DIAGNOSIS — J9811 Atelectasis: Secondary | ICD-10-CM | POA: Diagnosis not present

## 2020-11-18 DIAGNOSIS — Z01810 Encounter for preprocedural cardiovascular examination: Secondary | ICD-10-CM

## 2020-11-18 DIAGNOSIS — I25110 Atherosclerotic heart disease of native coronary artery with unstable angina pectoris: Secondary | ICD-10-CM | POA: Diagnosis not present

## 2020-11-18 DIAGNOSIS — Z8249 Family history of ischemic heart disease and other diseases of the circulatory system: Secondary | ICD-10-CM

## 2020-11-18 DIAGNOSIS — I2510 Atherosclerotic heart disease of native coronary artery without angina pectoris: Secondary | ICD-10-CM

## 2020-11-18 DIAGNOSIS — E785 Hyperlipidemia, unspecified: Secondary | ICD-10-CM | POA: Diagnosis not present

## 2020-11-18 DIAGNOSIS — Z79899 Other long term (current) drug therapy: Secondary | ICD-10-CM | POA: Diagnosis not present

## 2020-11-18 DIAGNOSIS — D696 Thrombocytopenia, unspecified: Secondary | ICD-10-CM | POA: Diagnosis not present

## 2020-11-18 DIAGNOSIS — R9439 Abnormal result of other cardiovascular function study: Secondary | ICD-10-CM

## 2020-11-18 DIAGNOSIS — Z20822 Contact with and (suspected) exposure to covid-19: Secondary | ICD-10-CM | POA: Diagnosis not present

## 2020-11-18 DIAGNOSIS — K589 Irritable bowel syndrome without diarrhea: Secondary | ICD-10-CM | POA: Diagnosis present

## 2020-11-18 DIAGNOSIS — I25118 Atherosclerotic heart disease of native coronary artery with other forms of angina pectoris: Principal | ICD-10-CM

## 2020-11-18 DIAGNOSIS — H9190 Unspecified hearing loss, unspecified ear: Secondary | ICD-10-CM | POA: Diagnosis present

## 2020-11-18 DIAGNOSIS — J90 Pleural effusion, not elsewhere classified: Secondary | ICD-10-CM

## 2020-11-18 DIAGNOSIS — J939 Pneumothorax, unspecified: Secondary | ICD-10-CM

## 2020-11-18 DIAGNOSIS — E8770 Fluid overload, unspecified: Secondary | ICD-10-CM | POA: Diagnosis not present

## 2020-11-18 DIAGNOSIS — Z9049 Acquired absence of other specified parts of digestive tract: Secondary | ICD-10-CM | POA: Diagnosis not present

## 2020-11-18 DIAGNOSIS — E559 Vitamin D deficiency, unspecified: Secondary | ICD-10-CM | POA: Diagnosis present

## 2020-11-18 DIAGNOSIS — Z85820 Personal history of malignant melanoma of skin: Secondary | ICD-10-CM

## 2020-11-18 DIAGNOSIS — Z7982 Long term (current) use of aspirin: Secondary | ICD-10-CM | POA: Diagnosis not present

## 2020-11-18 DIAGNOSIS — I10 Essential (primary) hypertension: Secondary | ICD-10-CM | POA: Diagnosis not present

## 2020-11-18 DIAGNOSIS — D62 Acute posthemorrhagic anemia: Secondary | ICD-10-CM | POA: Diagnosis not present

## 2020-11-18 DIAGNOSIS — Z951 Presence of aortocoronary bypass graft: Secondary | ICD-10-CM

## 2020-11-18 DIAGNOSIS — I25119 Atherosclerotic heart disease of native coronary artery with unspecified angina pectoris: Secondary | ICD-10-CM | POA: Diagnosis not present

## 2020-11-18 HISTORY — PX: LEFT HEART CATH AND CORONARY ANGIOGRAPHY: CATH118249

## 2020-11-18 HISTORY — DX: Atherosclerotic heart disease of native coronary artery without angina pectoris: I25.10

## 2020-11-18 SURGERY — LEFT HEART CATH AND CORONARY ANGIOGRAPHY
Anesthesia: LOCAL

## 2020-11-18 MED ORDER — SODIUM CHLORIDE 0.9% FLUSH: 3.0000 mL | INTRAVENOUS | Status: DC | PRN

## 2020-11-18 MED ORDER — FLUTICASONE PROPIONATE 50 MCG/ACT NA SUSP: 2.0000 | Freq: Every day | NASAL | Status: DC | PRN

## 2020-11-18 MED ORDER — HEPARIN SODIUM (PORCINE) 1000 UNIT/ML IJ SOLN
INTRAMUSCULAR | Status: AC
  Filled 2020-11-18: qty 1

## 2020-11-18 MED ORDER — DEXMEDETOMIDINE HCL IN NACL 400 MCG/100ML IV SOLN
0.1000 ug/kg/h | INTRAVENOUS | Status: AC
  Administered 2020-11-21: .4 ug/kg/h via INTRAVENOUS
  Filled 2020-11-18: qty 100

## 2020-11-18 MED ORDER — INSULIN REGULAR(HUMAN) IN NACL 100-0.9 UT/100ML-% IV SOLN
INTRAVENOUS | Status: AC
  Administered 2020-11-21: 1.2 [IU]/h via INTRAVENOUS
  Filled 2020-11-18: qty 100

## 2020-11-18 MED ORDER — HEPARIN (PORCINE) 25000 UT/250ML-% IV SOLN
1300.0000 [IU]/h | INTRAVENOUS | Status: DC
  Administered 2020-11-18: 1050 [IU]/h via INTRAVENOUS
  Administered 2020-11-19: 1200 [IU]/h via INTRAVENOUS
  Administered 2020-11-20: 1300 [IU]/h via INTRAVENOUS
  Filled 2020-11-18 (×4): qty 250

## 2020-11-18 MED ORDER — ASPIRIN 81 MG PO CHEW: 81.0000 mg | CHEWABLE_TABLET | ORAL | Status: DC

## 2020-11-18 MED ORDER — ATORVASTATIN CALCIUM 80 MG PO TABS
80.0000 mg | ORAL_TABLET | Freq: Every evening | ORAL | Status: DC
  Administered 2020-11-18 – 2020-11-24 (×7): 80 mg via ORAL
  Filled 2020-11-18 (×7): qty 1

## 2020-11-18 MED ORDER — MANNITOL 20 % IV SOLN
Freq: Once | INTRAVENOUS | Status: DC
  Filled 2020-11-18: qty 13

## 2020-11-18 MED ORDER — LIDOCAINE HCL (PF) 1 % IJ SOLN
INTRAMUSCULAR | Status: DC | PRN
  Administered 2020-11-18: 2 mL

## 2020-11-18 MED ORDER — SODIUM CHLORIDE 0.9 % IV SOLN
1.5000 g | INTRAVENOUS | Status: AC
  Administered 2020-11-21: 1.5 g via INTRAVENOUS
  Filled 2020-11-18: qty 1.5

## 2020-11-18 MED ORDER — SODIUM CHLORIDE 0.9 % WEIGHT BASED INFUSION: 1.0000 mL/kg/h | INTRAVENOUS | Status: DC

## 2020-11-18 MED ORDER — NOREPINEPHRINE 4 MG/250ML-% IV SOLN
0.0000 ug/min | INTRAVENOUS | Status: DC
  Filled 2020-11-18: qty 250

## 2020-11-18 MED ORDER — NITROGLYCERIN 0.4 MG SL SUBL: 0.4000 mg | SUBLINGUAL_TABLET | SUBLINGUAL | Status: DC | PRN

## 2020-11-18 MED ORDER — LIDOCAINE HCL (PF) 1 % IJ SOLN
INTRAMUSCULAR | Status: AC
  Filled 2020-11-18: qty 30

## 2020-11-18 MED ORDER — TRANEXAMIC ACID 1000 MG/10ML IV SOLN
1.5000 mg/kg/h | INTRAVENOUS | Status: AC
  Administered 2020-11-21: 1.5 mg/kg/h via INTRAVENOUS
  Filled 2020-11-18: qty 25

## 2020-11-18 MED ORDER — MIDAZOLAM HCL 2 MG/2ML IJ SOLN
INTRAMUSCULAR | Status: DC | PRN
  Administered 2020-11-18: 2 mg via INTRAVENOUS

## 2020-11-18 MED ORDER — IOHEXOL 350 MG/ML SOLN
INTRAVENOUS | Status: DC | PRN
  Administered 2020-11-18: 75 mL

## 2020-11-18 MED ORDER — LABETALOL HCL 5 MG/ML IV SOLN: 10.0000 mg | INTRAVENOUS | Status: AC | PRN

## 2020-11-18 MED ORDER — SODIUM CHLORIDE 0.9 % IV SOLN
750.0000 mg | INTRAVENOUS | Status: AC
  Administered 2020-11-21: 750 mg via INTRAVENOUS
  Filled 2020-11-18: qty 750

## 2020-11-18 MED ORDER — VERAPAMIL HCL 2.5 MG/ML IV SOLN
INTRAVENOUS | Status: AC
  Filled 2020-11-18: qty 2

## 2020-11-18 MED ORDER — FENTANYL CITRATE (PF) 100 MCG/2ML IJ SOLN
INTRAMUSCULAR | Status: AC
  Filled 2020-11-18: qty 2

## 2020-11-18 MED ORDER — NITROGLYCERIN IN D5W 200-5 MCG/ML-% IV SOLN
2.0000 ug/min | INTRAVENOUS | Status: AC
  Administered 2020-11-21: 16.6 ug/min via INTRAVENOUS
  Filled 2020-11-18: qty 250

## 2020-11-18 MED ORDER — EPINEPHRINE HCL 5 MG/250ML IV SOLN IN NS
0.0000 ug/min | INTRAVENOUS | Status: DC
  Filled 2020-11-18: qty 250

## 2020-11-18 MED ORDER — ACETAMINOPHEN 500 MG PO TABS: 1000.0000 mg | ORAL_TABLET | Freq: Four times a day (QID) | ORAL | Status: DC | PRN

## 2020-11-18 MED ORDER — POTASSIUM CHLORIDE 2 MEQ/ML IV SOLN
80.0000 meq | INTRAVENOUS | Status: DC
  Filled 2020-11-18: qty 40

## 2020-11-18 MED ORDER — SODIUM CHLORIDE 0.9 % IV SOLN
INTRAVENOUS | Status: DC
  Filled 2020-11-18: qty 30

## 2020-11-18 MED ORDER — FENTANYL CITRATE (PF) 100 MCG/2ML IJ SOLN
INTRAMUSCULAR | Status: DC | PRN
  Administered 2020-11-18: 25 ug via INTRAVENOUS

## 2020-11-18 MED ORDER — TRANEXAMIC ACID (OHS) BOLUS VIA INFUSION
15.0000 mg/kg | INTRAVENOUS | Status: AC
  Administered 2020-11-21: 1122 mg via INTRAVENOUS
  Filled 2020-11-18: qty 1122

## 2020-11-18 MED ORDER — PLASMA-LYTE 148 IV SOLN
INTRAVENOUS | Status: DC
  Filled 2020-11-18: qty 2.5

## 2020-11-18 MED ORDER — TRANEXAMIC ACID (OHS) PUMP PRIME SOLUTION
2.0000 mg/kg | INTRAVENOUS | Status: DC
  Filled 2020-11-18: qty 1.5

## 2020-11-18 MED ORDER — HEPARIN (PORCINE) IN NACL 1000-0.9 UT/500ML-% IV SOLN
INTRAVENOUS | Status: AC
  Filled 2020-11-18: qty 1000

## 2020-11-18 MED ORDER — SODIUM CHLORIDE 0.9% FLUSH
3.0000 mL | Freq: Two times a day (BID) | INTRAVENOUS | Status: DC
  Administered 2020-11-18 – 2020-11-20 (×3): 3 mL via INTRAVENOUS

## 2020-11-18 MED ORDER — HEPARIN SODIUM (PORCINE) 1000 UNIT/ML IJ SOLN
INTRAMUSCULAR | Status: DC | PRN
  Administered 2020-11-18: 4000 [IU] via INTRAVENOUS

## 2020-11-18 MED ORDER — VANCOMYCIN HCL 1250 MG/250ML IV SOLN
1250.0000 mg | INTRAVENOUS | Status: AC
  Administered 2020-11-21: 1250 mg via INTRAVENOUS
  Filled 2020-11-18: qty 250

## 2020-11-18 MED ORDER — SODIUM CHLORIDE 0.9 % IV SOLN: 250.0000 mL | INTRAVENOUS | Status: DC | PRN

## 2020-11-18 MED ORDER — HEPARIN (PORCINE) IN NACL 1000-0.9 UT/500ML-% IV SOLN
INTRAVENOUS | Status: DC | PRN
  Administered 2020-11-18 (×2): 500 mL

## 2020-11-18 MED ORDER — MILRINONE LACTATE IN DEXTROSE 20-5 MG/100ML-% IV SOLN
0.3000 ug/kg/min | INTRAVENOUS | Status: DC
  Filled 2020-11-18: qty 100

## 2020-11-18 MED ORDER — ISOSORBIDE MONONITRATE ER 30 MG PO TB24
30.0000 mg | ORAL_TABLET | Freq: Every day | ORAL | Status: DC
  Administered 2020-11-18 – 2020-11-20 (×3): 30 mg via ORAL
  Filled 2020-11-18 (×4): qty 1

## 2020-11-18 MED ORDER — METOPROLOL TARTRATE 12.5 MG HALF TABLET
12.5000 mg | ORAL_TABLET | Freq: Two times a day (BID) | ORAL | Status: DC
  Administered 2020-11-18 – 2020-11-20 (×5): 12.5 mg via ORAL
  Filled 2020-11-18 (×6): qty 1

## 2020-11-18 MED ORDER — HYDRALAZINE HCL 20 MG/ML IJ SOLN: 10.0000 mg | INTRAMUSCULAR | Status: AC | PRN

## 2020-11-18 MED ORDER — ACETAMINOPHEN 325 MG PO TABS: 650.0000 mg | ORAL_TABLET | ORAL | Status: DC | PRN

## 2020-11-18 MED ORDER — SODIUM CHLORIDE 0.9% FLUSH
3.0000 mL | Freq: Two times a day (BID) | INTRAVENOUS | Status: DC
  Administered 2020-11-20: 3 mL via INTRAVENOUS

## 2020-11-18 MED ORDER — PHENYLEPHRINE HCL-NACL 20-0.9 MG/250ML-% IV SOLN
30.0000 ug/min | INTRAVENOUS | Status: AC
  Administered 2020-11-21: 25 ug/min via INTRAVENOUS
  Filled 2020-11-18: qty 250

## 2020-11-18 MED ORDER — SODIUM CHLORIDE 0.9 % IV SOLN: INTRAVENOUS | Status: DC

## 2020-11-18 MED ORDER — VERAPAMIL HCL 2.5 MG/ML IV SOLN
INTRAVENOUS | Status: DC | PRN
  Administered 2020-11-18: 10 mL via INTRA_ARTERIAL

## 2020-11-18 MED ORDER — ONDANSETRON HCL 4 MG/2ML IJ SOLN: 4.0000 mg | Freq: Four times a day (QID) | INTRAMUSCULAR | Status: DC | PRN

## 2020-11-18 MED ORDER — AMLODIPINE BESYLATE 10 MG PO TABS
10.0000 mg | ORAL_TABLET | Freq: Every day | ORAL | Status: DC
  Administered 2020-11-19 – 2020-11-20 (×2): 10 mg via ORAL
  Filled 2020-11-18 (×3): qty 1

## 2020-11-18 MED ORDER — MIDAZOLAM HCL 2 MG/2ML IJ SOLN
INTRAMUSCULAR | Status: AC
  Filled 2020-11-18: qty 2

## 2020-11-18 MED ORDER — ASPIRIN 81 MG PO CHEW
81.0000 mg | CHEWABLE_TABLET | Freq: Every day | ORAL | Status: DC
  Administered 2020-11-19 – 2020-11-20 (×2): 81 mg via ORAL
  Filled 2020-11-18 (×2): qty 1

## 2020-11-18 MED ORDER — SODIUM CHLORIDE 0.9 % WEIGHT BASED INFUSION
3.0000 mL/kg/h | INTRAVENOUS | Status: AC
  Administered 2020-11-18: 3 mL/kg/h via INTRAVENOUS

## 2020-11-18 MED ORDER — ASPIRIN EC 81 MG PO TBEC: 81.0000 mg | DELAYED_RELEASE_TABLET | Freq: Every day | ORAL | Status: DC

## 2020-11-18 SURGICAL SUPPLY — 12 items
CATH INFINITI JR4 5F (CATHETERS) ×2 IMPLANT
CATH OPTITORQUE TIG 4.0 5F (CATHETERS) ×2 IMPLANT
DEVICE RAD COMP TR BAND LRG (VASCULAR PRODUCTS) ×2 IMPLANT
GLIDESHEATH SLEND SS 6F .021 (SHEATH) ×2 IMPLANT
GUIDEWIRE INQWIRE 1.5J.035X260 (WIRE) ×1 IMPLANT
INQWIRE 1.5J .035X260CM (WIRE) ×2
KIT HEART LEFT (KITS) ×2 IMPLANT
PACK CARDIAC CATHETERIZATION (CUSTOM PROCEDURE TRAY) ×2 IMPLANT
SHEATH PROBE COVER 6X72 (BAG) ×2 IMPLANT
TRANSDUCER W/STOPCOCK (MISCELLANEOUS) ×2 IMPLANT
TUBING CIL FLEX 10 FLL-RA (TUBING) ×2 IMPLANT
WIRE HI TORQ VERSACORE-J 145CM (WIRE) ×2 IMPLANT

## 2020-11-18 NOTE — Progress Notes (Signed)
TR BAND REMOVAL  LOCATION:    right radial  DEFLATED PER PROTOCOL:    Yes.    TIME BAND OFF / DRESSING APPLIED:    1200   SITE UPON ARRIVAL:    Level 0  SITE AFTER BAND REMOVAL:    Level 0  CIRCULATION SENSATION AND MOVEMENT:    Within Normal Limits   Yes.    COMMENTS:   R radial pulse @ 3+

## 2020-11-18 NOTE — Interval H&P Note (Signed)
Cath Lab Visit (complete for each Cath Lab visit)  Clinical Evaluation Leading to the Procedure:   ACS: No.  Non-ACS:    Anginal Classification: CCS III  Anti-ischemic medical therapy: Minimal Therapy (1 class of medications)  Non-Invasive Test Results: Intermediate-risk stress test findings: cardiac mortality 1-3%/year  Prior CABG: No previous CABG      History and Physical Interval Note:  11/18/2020 7:41 AM  Eddie Fox  has presented today for surgery, with the diagnosis of abnormal stress test.  The various methods of treatment have been discussed with the patient and family. After consideration of risks, benefits and other options for treatment, the patient has consented to  Procedure(s): LEFT HEART CATH AND CORONARY ANGIOGRAPHY (N/A) as a surgical intervention.  The patient's history has been reviewed, patient examined, no change in status, stable for surgery.  I have reviewed the patient's chart and labs.  Questions were answered to the patient's satisfaction.     Shelva Majestic

## 2020-11-18 NOTE — Progress Notes (Signed)
Pre-CABG testing is completed.  11/18/2020    Luz Brazen RVT

## 2020-11-18 NOTE — Progress Notes (Signed)
Admitted to room without incident, patient transferred from stretcher to bed without difficulty.  Vascular site to R wrist is WNL without s/s of bleeding at this time.  Cardiac monitor applied, IV fluids infusing to LFA.  Denies pain at this time.

## 2020-11-18 NOTE — Progress Notes (Signed)
TCTS consulted for CABG evaluation. °

## 2020-11-18 NOTE — Progress Notes (Signed)
ANTICOAGULATION CONSULT NOTE - Initial Consult  Pharmacy Consult for Heparin  Indication: chest pain/ACS  Allergies  Allergen Reactions  . Hydrocodone     pts states made him sick.  . Pneumococcal Vaccine     REACTION: significant arm swelling, redness    Patient Measurements: Height: 5\' 10"  (177.8 cm) Weight: 74.8 kg (165 lb) IBW/kg (Calculated) : 73   Vital Signs: Temp: 98.3 F (36.8 C) (12/03 1358) Temp Source: Oral (12/03 1358) BP: 154/48 (12/03 1358) Pulse Rate: 59 (12/03 1358)  Labs: No results for input(s): HGB, HCT, PLT, APTT, LABPROT, INR, HEPARINUNFRC, HEPRLOWMOCWT, CREATININE, CKTOTAL, CKMB, TROPONINIHS in the last 72 hours.  Estimated Creatinine Clearance: 59.9 mL/min (by C-G formula based on SCr of 1.05 mg/dL).   Medical History: Past Medical History:  Diagnosis Date  . Benign neoplasm of colon   . ED (erectile dysfunction)   . IBS (irritable bowel syndrome)   . Melanoma ([place])    history of  . Other and unspecified hyperlipidemia   . Other chronic nonalcoholic liver disease   . PONV (postoperative nausea and vomiting)   . Unspecified essential hypertension   . Unspecified vitamin D deficiency       Assessment: 79yom Hx HLD, NASH and positive stress test admitted post cath with multivessel CAD> TCTS consult Will begin heparin drip 9hr post sheath removed  Tr band removed  - no issues    Goal of Therapy:  Heparin level 0.3-0.7 units/ml Monitor platelets by anticoagulation protocol: Yes   Plan:  Heparin to start 9hr post sheath removal Sheath out at 0830 > heparin to start at 6pm Heparin drip rate 1050 uts/hr  Daily heparin level and CBC Monitor s/s bleeding    Bonnita Nasuti Pharm.D. CPP, BCPS Clinical Pharmacist [phone] 11/18/2020 2:37 PM

## 2020-11-18 NOTE — Consult Note (Addendum)
[address]       [place] [address]             [phone]        Kalem T Lemmons [place] Medical Record #[id number] Date of Birth: 1940-12-31  Referring: Dr. Claiborne Billings, MD Primary Care: Owens Loffler, MD Primary Cardiologist:Gayatri Stann Mainland, MD  Chief Complaint: Substernal chest pressure with exertion Reason for consultation: Coronary artery disease  History of Present Illness:     This is a 79 year old male with a past medical history of hyperlipidemia, NASH, and essential hypertension whose initial symptoms were substernal chest pressure and left sided "funny feeling" in his chest while walking. These symptoms stopped after about 30 minutes of resting. This past Sunday, he felt even worse with this complaint. He was seen at his PCP's office (Dr. Alba Cory as his primary physician, Dr. Lorelei Pont, was not available) and EKG showed atrial bigeminy. He was then referred to Dr. Margaretann Loveless. A nuclear stress test done 10/19/2020 showed inferolateral infarct but no ischemia. An echo also done 10/19/2020 showed LVEF 55-60%, LA severely dilated, RA moderately dilated, the left ventricle has no regional wall motion abnormalities, and mild AI. He presented to [facility] today for a cardiac catheterization. Results showed severe multivessel coronary artery disease (mid LAD with 80% stenosis, distal LAD with 85% stenosis, ostial Circumflex to proximal Circumflex with 95% stenosis, and a 90% stenosis of the distal RCA). Dr. Kipp Brood has been consulted for the consideration of coronary artery bypass grafting surgery. At the time of my exam, he denies chest pressure/pain or shortness of breath.   Current Activity/ Functional Status: Patient is independent with mobility/ambulation, transfers, ADL's, IADL's.   Zubrod Score: At the time of surgery this patient's most appropriate activity status/level should be described as: []     0    Normal activity, no symptoms [x]     1     Restricted in physical strenuous activity but ambulatory, able to do out light work []     2    Ambulatory and capable of self care, unable to do work activities, up and about more than 50%  Of the time                            []     3    Only limited self care, in bed greater than 50% of waking hours []     4    Completely disabled, no self care, confined to bed or chair []     5    Moribund  Past Medical History:  Diagnosis Date  . Benign neoplasm of colon   . ED (erectile dysfunction)   . IBS (irritable bowel syndrome)   . Melanoma ([place])    history of  . Other and unspecified hyperlipidemia   . Other chronic nonalcoholic liver disease   . PONV (postoperative nausea and vomiting)   . Unspecified essential hypertension   . Unspecified vitamin D deficiency     Past Surgical History:  Procedure Laterality Date  . AMPUTATION     distal phalanx of right thumb- traumatic secondary to being caught in a chain  . CHOLECYSTECTOMY  10/03/09   lap  . LAMINECTOMY  1995   L 3-T11 Harrington rods in back  . LAPAROSCOPIC APPENDECTOMY N/A 05/16/2016   Procedure: APPENDECTOMY LAPAROSCOPIC;  Surgeon: Greer Pickerel, MD;  Location: [facility];  Service: General;  Laterality: N/A; [place]  cell carcinoid tumor of the appendix  . [place]   right upper abd, Dr. Ronnald Ramp  . METATARSAL HEAD EXCISION Right 06/23/2020   Procedure: RIGHT 5TH METATARSAL OSTEOTOMY WITH BUNIONETTE EXCISION, OPEN REDUCTION OF FIFTH METATARSAL PHALANGEAL JOINT, FIFTH METATARSAL PHALANGEAL CAPSULOTOMY, BURSECTOMY;  Surgeon: Erle Crocker, MD;  Location: [facility];  Service: Orthopedics;  Laterality: Right;  LENGTH OF SURGERY: 1.5 HOURS  . ORIF ANKLE FRACTURE-has hardware  1967    Social History   Tobacco Use  Smoking Status Never Smoker  Smokeless Tobacco Never Used    Social History   Substance and Sexual Activity  Alcohol Use No  Patient lives alone, is widowed. He has a daughter who lives  locally. His other daughter died at age 93 from cancer.   Allergies  Allergen Reactions  . Hydrocodone     pts states made him sick.  . Pneumococcal Vaccine     REACTION: significant arm swelling, redness    Current Facility-Administered Medications  Medication Dose Route Frequency Provider Last Rate Last Admin  . 0.9 %  sodium chloride infusion  250 mL Intravenous PRN Cherlynn Kaiser A, MD      . 0.9 %  sodium chloride infusion   Intravenous Continuous Troy Sine, MD 125 mL/hr at 11/18/20 1052 New Bag at 11/18/20 1052  . 0.9% sodium chloride infusion  1 mL/kg/hr Intravenous Continuous Elouise Munroe, MD 74.8 mL/hr at 11/18/20 5361 1 mL/kg/hr at 11/18/20 4431  . sodium chloride flush (NS) 0.9 % injection 3 mL  3 mL Intravenous Q12H Cherlynn Kaiser A, MD      . sodium chloride flush (NS) 0.9 % injection 3 mL  3 mL Intravenous PRN Elouise Munroe, MD        Medications Prior to Admission  Medication Sig Dispense Refill Last Dose  . acetaminophen (TYLENOL) 500 MG tablet Take 1,000 mg by mouth every 6 (six) hours as needed for mild pain or headache.   Past Week at Unknown time  . amLODipine (NORVASC) 10 MG tablet TAKE 1 TABLET BY MOUTH  DAILY (Patient taking differently: Take 10 mg by mouth daily. ) 90 tablet 3 11/18/2020 at Unknown time  . aspirin EC 81 MG tablet Take 1 tablet (81 mg total) by mouth daily. Swallow whole. 90 tablet 3 11/18/2020 at 0445  . fluticasone (FLONASE) 50 MCG/ACT nasal spray USE 2 SPRAY(S) IN EACH NOSTRIL ONCE DAILY AS NEEDED (Patient taking differently: Place 2 sprays into both nostrils daily as needed for rhinitis. ) 48 g 3 11/17/2020 at Unknown time  . nitroGLYCERIN (NITROSTAT) 0.4 MG SL tablet Place 1 tablet (0.4 mg total) under the tongue every 5 (five) minutes as needed for chest pain. 90 tablet 3 none    Family History  Problem Relation Age of Onset  . Heart disease Mother        CHF  . Hypertension Mother   . Diabetes Mother   . Stroke Mother    . Hypertension Father   . Heart disease Father        CHF  . Hypertension Sister   . Hypertension Sister   . Depression Neg Hx   . Alcohol abuse Neg Hx   . Drug abuse Neg Hx     Review of Systems:     Cardiac Review of Systems: Y or  [ N   ]= no  Chest Pain [ N   ]  Resting SOB [ N  ] Exertional  SOB  Aqua.Slicker  ]  Orthopnea [ N ]   Pedal Edema Aqua.Slicker   ]    Syncope  Aqua.Slicker  ]   Presyncope [  N ]  General Review of Systems: [Y] = yes [ N ]=no Constitional:  anorexia [ N ];  nausea Aqua.Slicker  ]; night sweats [ N ]; fever Aqua.Slicker  ]; or chills [ N ]                                                               Dental: Last Dentist visit: About 10 months ago. Has partial upper plate  Eye : Amaurosis fugax[N  ]; Resp: cough [ N ];  wheezing[ N ];  hemoptysis[N  ];  GI:  vomiting[ N ];  melena[N  ];  hematochezia Aqua.Slicker  ];  GU:  hematuria[N  ];                Skin: rash, swelling[ N ];, hair loss[  ];    Heme/Lymph: anemia[ N ];  Neuro: TIA[  ]; stroke[ ] ;  vertigo[ ] ;  seizures[ N ];     Endocrine: diabetes[ N ];  thyroid dysfunction[  N];            He has had 2 vaccinations for COVID 19 and is going to get his booster after hospitalization      Physical Exam: BP (!) 131/42   Pulse (!) 54   Temp 98.2 F (36.8 C) (Oral)   Resp 16   Ht 5\' 10"  (1.778 m)   Wt 74.8 kg   SpO2 95%   BMI 23.68 kg/m    General appearance: alert, cooperative, no distress and Hard of hearing Head: Normocephalic, without obvious abnormality, atraumatic Neck: no carotid bruit, no JVD and supple, symmetrical, trachea midline Resp: clear to auscultation bilaterally  Cardio: RRR, no murmur GI: Soft, non tender, bowel sounds present Extremities: Palpable DP/PTs bilaterally. Neurologic: Grossly normal  Diagnostic Studies & Laboratory data:     Recent Radiology Findings:   CARDIAC CATHETERIZATION  Result Date: 11/18/2020  Dist RCA lesion is 90% stenosed.  RPAV lesion is 60% stenosed.  Mid RCA lesion is 30% stenosed.  Ost  Cx to Prox Cx lesion is 95% stenosed.  Prox LAD lesion is 50% stenosed.  1st Diag lesion is 50% stenosed.  Mid LAD-1 lesion is 70% stenosed.  Mid LAD-2 lesion is 80% stenosed.  Dist LAD-1 lesion is 85% stenosed.  Dist LAD-2 lesion is 85% stenosed.  The left ventricular systolic function is normal.  LV end diastolic pressure is low.  Severe multivessel CAD with evidence for coronary calcification involving the left coronary system. There is significant segmental LAD stenoses of 50% proximally also involving the first diagonal vessel followed by 70% stenosis in the proximal to mid segment, and segmental 80 to 85% mid stenosis. Left circumflex stenosis 95% ostial stenosis and reduced antegrade flow secondary to competitive filling via RCA collateralization. Dominant RCA with significant vessel tortuosity with 30% mid stenosis, focal 90% stenosis immediately proximal to the takeoff of the PDA vessel followed by 60% stenosis of the continuation branch PLA with collateralization to the distal circumflex from the distal RCA branches. Preserved LV function without focal segmental wall motion abnormalities.  LVEDP 6 mm. RECOMMENDATION:  Recommend surgical consultation for CABG revascularization.  With the patient's high-grade distal RCA stenosis collateralizing the circumflex vessel with 95% ostial stenosis and with diffuse LAD disease, recommend the patient stay in the hospital.  Plan for increased medical therapy with addition of nitrates, very low dose beta-blocker as pulse allows and initiation of heparin later tonight.  Diagnostic Dominance: Right  Intervention     I have independently reviewed the above radiologic studies and discussed with the patient   Recent Lab Findings: Lab Results  Component Value Date   WBC 8.0 11/07/2020   HGB 14.7 11/07/2020   HCT 42.5 11/07/2020   PLT 250 11/07/2020   GLUCOSE 80 11/07/2020   CHOL 144 11/23/2019   TRIG 105.0 11/23/2019   HDL 41.00 11/23/2019    LDLDIRECT 171.9 07/08/2007   LDLCALC 82 11/23/2019   ALT 12 09/26/2020   AST 15 09/26/2020   NA 143 11/07/2020   K 4.7 11/07/2020   CL 107 (H) 11/07/2020   CREATININE 1.05 11/07/2020   BUN 18 11/07/2020   CO2 22 11/07/2020   TSH 2.98 09/26/2020   INR 1.01 10/03/2009   Assessment / Plan:   1. Coronary artery disease-he would benefit from coronary artery bypass grafting surgery. Continue pre op work up. Dr. Kipp Brood to evaluate. Patient to OR on Monday 11/21/2020. Will stop Heparin on call to OR. 2. History of essential hypertension-on Amlodipine 10 mg prior to admission 3. History of hyperlipidemia- Will discuss with cardiology regarding the use of statins. He has NASH but in and of itself, is not an absolute contra indication to use.   I  spent 20 minutes counseling the patient face to face.   Lars Pinks PA-C 11/18/2020 1:42 PM     Agree with above. This is a 79 year old male who underwent an elective left heart cath after being worked up for positive stress test. 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 His ejection fraction is preserved and he has no significant valvular disease. On review of his left heart cath he has good targets in his LAD and PDA. His circumflex distribution is small and fills from right to left collaterals distally. He likely will require CABG x4 including the diagonal. He is tentatively scheduled for November 21, 2020. Risks and benefits have been discussed and he is agreeable to proceed.

## 2020-11-18 NOTE — Progress Notes (Signed)
Patient asked that I call his nephew so that he can care for his cats at home.  One contact number available, spoke with patient's daughter per phone and informed her of current plan of care, upcoming procedure, and visitation policy.  Daughter stated she would pass along to other family members.

## 2020-11-19 ENCOUNTER — Inpatient Hospital Stay (HOSPITAL_COMMUNITY): Payer: Medicare Other

## 2020-11-19 DIAGNOSIS — I25119 Atherosclerotic heart disease of native coronary artery with unspecified angina pectoris: Secondary | ICD-10-CM | POA: Diagnosis not present

## 2020-11-19 LAB — CBC
HCT: 37.5 % — ABNORMAL LOW (ref 39.0–52.0)
Hemoglobin: 12.8 g/dL — ABNORMAL LOW (ref 13.0–17.0)
MCH: 32.6 pg (ref 26.0–34.0)
MCHC: 34.1 g/dL (ref 30.0–36.0)
MCV: 95.4 fL (ref 80.0–100.0)
Platelets: 152 10*3/uL (ref 150–400)
RBC: 3.93 MIL/uL — ABNORMAL LOW (ref 4.22–5.81)
RDW: 12.3 % (ref 11.5–15.5)
WBC: 5.9 10*3/uL (ref 4.0–10.5)
nRBC: 0 % (ref 0.0–0.2)

## 2020-11-19 LAB — HEPARIN LEVEL (UNFRACTIONATED)
Heparin Unfractionated: 0.21 IU/mL — ABNORMAL LOW (ref 0.30–0.70)
Heparin Unfractionated: 0.52 IU/mL (ref 0.30–0.70)

## 2020-11-19 LAB — BASIC METABOLIC PANEL
Anion gap: 8 (ref 5–15)
BUN: 13 mg/dL (ref 8–23)
CO2: 22 mmol/L (ref 22–32)
Calcium: 8.6 mg/dL — ABNORMAL LOW (ref 8.9–10.3)
Chloride: 111 mmol/L (ref 98–111)
Creatinine, Ser: 1.11 mg/dL (ref 0.61–1.24)
GFR, Estimated: >60 mL/min (ref >60)
Glucose, Bld: 95 mg/dL (ref 70–99)
Potassium: 3.9 mmol/L (ref 3.5–5.1)
Sodium: 141 mmol/L (ref 135–145)

## 2020-11-19 MED ORDER — ALPRAZOLAM 0.25 MG PO TABS: 0.2500 mg | ORAL_TABLET | Freq: Two times a day (BID) | ORAL | Status: DC | PRN

## 2020-11-19 MED ORDER — ONDANSETRON HCL 4 MG/2ML IJ SOLN: 4.0000 mg | Freq: Four times a day (QID) | INTRAMUSCULAR | Status: DC | PRN

## 2020-11-19 MED ORDER — GUAIFENESIN-DM 100-10 MG/5ML PO SYRP
5.0000 mL | ORAL_SOLUTION | ORAL | Status: DC | PRN
  Administered 2020-11-19 (×2): 5 mL via ORAL
  Filled 2020-11-19 (×2): qty 5

## 2020-11-19 MED ORDER — ZOLPIDEM TARTRATE 5 MG PO TABS: 5.0000 mg | ORAL_TABLET | Freq: Every evening | ORAL | Status: DC | PRN

## 2020-11-19 NOTE — Progress Notes (Signed)
Progress Note  Patient Name: Eddie Fox Date of Encounter: 11/19/2020  Primary Cardiologist: Elouise Munroe, MD   Subjective   Feels well.  We discussed his symptoms over the past few weeks.  He denies any chest pain since his initial encounter with me.  We discussed that with an abnormal stress test and abnormal coronary angiogram, would be reasonable to pursue revascularization.  He is in good spirits and has communicated with his family.  Inpatient Medications    Scheduled Meds: . amLODipine  10 mg Oral Daily  . aspirin  81 mg Oral Daily  . atorvastatin  80 mg Oral QPM  . [START ON 11/21/2020] epinephrine  0-10 mcg/min Intravenous To OR  . [START ON 11/21/2020] heparin-papaverine-plasmalyte irrigation   Irrigation To OR  . [START ON 11/21/2020] insulin   Intravenous To OR  . isosorbide mononitrate  30 mg Oral Daily  . [START ON 11/21/2020] [place] Blood Cardioplegia vial (lidocaine/magnesium/mannitol 0.26g-4g-6.4g)   Intracoronary Once  . metoprolol tartrate  12.5 mg Oral BID  . [START ON 11/21/2020] phenylephrine  30-200 mcg/min Intravenous To OR  . [START ON 11/21/2020] potassium chloride  80 mEq Other To OR  . sodium chloride flush  3 mL Intravenous Q12H  . sodium chloride flush  3 mL Intravenous Q12H  . [START ON 11/21/2020] tranexamic acid  15 mg/kg Intravenous To OR  . [START ON 11/21/2020] tranexamic acid  2 mg/kg Intracatheter To OR   Continuous Infusions: . sodium chloride 125 mL/hr at 11/18/20 1052  . sodium chloride    . [START ON 11/21/2020] cefUROXime (ZINACEF)  IV    . [START ON 11/21/2020] cefUROXime (ZINACEF)  IV    . [START ON 11/21/2020] dexmedetomidine    . [START ON 11/21/2020] heparin 30,000 units/NS 1000 mL solution for CELLSAVER    . heparin 1,200 Units/hr (11/19/20 1937)  . [START ON 11/21/2020] milrinone    . [START ON 11/21/2020] nitroGLYCERIN    . [START ON 11/21/2020] norepinephrine    . [START ON 11/21/2020] tranexamic acid (CYKLOKAPRON)  infusion ([facility])    . [START ON 11/21/2020] vancomycin     PRN Meds: sodium chloride, acetaminophen, fluticasone, nitroGLYCERIN, ondansetron (ZOFRAN) IV, sodium chloride flush   Vital Signs    Vitals:   11/19/20 0428 11/19/20 0736 11/19/20 0806 11/19/20 0812  BP: (!) 113/58 (!) 122/57  (!) 118/55  Pulse: (!) 49 (!) 50 (!) 54 (!) 51  Resp: 15 20    Temp: 98.5 F (36.9 C) 98.6 F (37 C)    TempSrc: Oral Oral    SpO2: 96% 97%    Weight: 70.4 kg     Height:        Intake/Output Summary (Last 24 hours) at 11/19/2020 0901 Last data filed at 11/19/2020 0809 Gross per 24 hour  Intake 2689.83 ml  Output 2500 ml  Net 189.83 ml   Filed Weights   11/18/20 0535 11/19/20 0428  Weight: 74.8 kg 70.4 kg    Telemetry    Sinus bradycardia- Personally Reviewed  ECG    Sinus bradycardia PACs inferior infarct- Personally Reviewed  Physical Exam   GEN: No acute distress.   Neck: No JVD Cardiac: regular rhythm, normal rate, no murmurs, rubs, or gallops.  Respiratory: Clear to auscultation bilaterally. GI: Soft, nontender, non-distended  MS: No edema; No deformity. Neuro:  Nonfocal  Psych: Normal affect   Labs    Chemistry Recent Labs  Lab 11/19/20 0324  NA 141  K 3.9  CL  111  CO2 22  GLUCOSE 95  BUN 13  CREATININE 1.11  CALCIUM 8.6*  GFRNONAA >60  ANIONGAP 8     Hematology Recent Labs  Lab 11/19/20 0324  WBC 5.9  RBC 3.93*  HGB 12.8*  HCT 37.5*  MCV 95.4  MCH 32.6  MCHC 34.1  RDW 12.3  PLT 152    Cardiac EnzymesNo results for input(s): TROPONINI in the last 168 hours. No results for input(s): TROPIPOC in the last 168 hours.   BNPNo results for input(s): BNP, PROBNP in the last 168 hours.   DDimer No results for input(s): DDIMER in the last 168 hours.   Radiology    DG Chest 2 View  Result Date: 11/19/2020 CLINICAL DATA:  Pleural effusion EXAM: CHEST - 2 VIEW COMPARISON:  Chest x-ray dated 12/23/2009 FINDINGS: Heart size and mediastinal contours are  within normal limits. Lungs are clear. No pleural effusion or pneumothorax is seen. No acute appearing osseous abnormality. IMPRESSION: No active cardiopulmonary disease. No evidence of pneumonia or pulmonary edema. No pleural effusion. Electronically Signed   By: Franki Cabot M.D.   On: 11/19/2020 06:42   CARDIAC CATHETERIZATION  Result Date: 11/18/2020  Dist RCA lesion is 90% stenosed.  RPAV lesion is 60% stenosed.  Mid RCA lesion is 30% stenosed.  Ost Cx to Prox Cx lesion is 95% stenosed.  Prox LAD lesion is 50% stenosed.  1st Diag lesion is 50% stenosed.  Mid LAD-1 lesion is 70% stenosed.  Mid LAD-2 lesion is 80% stenosed.  Dist LAD-1 lesion is 85% stenosed.  Dist LAD-2 lesion is 85% stenosed.  The left ventricular systolic function is normal.  LV end diastolic pressure is low.  Severe multivessel CAD with evidence for coronary calcification involving the left coronary system. There is significant segmental LAD stenoses of 50% proximally also involving the first diagonal vessel followed by 70% stenosis in the proximal to mid segment, and segmental 80 to 85% mid stenosis. Left circumflex stenosis 95% ostial stenosis and reduced antegrade flow secondary to competitive filling via RCA collateralization. Dominant RCA with significant vessel tortuosity with 30% mid stenosis, focal 90% stenosis immediately proximal to the takeoff of the PDA vessel followed by 60% stenosis of the continuation branch PLA with collateralization to the distal circumflex from the distal RCA branches. Preserved LV function without focal segmental wall motion abnormalities.  LVEDP 6 mm. RECOMMENDATION: Recommend surgical consultation for CABG revascularization.  With the patient's high-grade distal RCA stenosis collateralizing the circumflex vessel with 95% ostial stenosis and with diffuse LAD disease, recommend the patient stay in the hospital.  Plan for increased medical therapy with addition of nitrates, very low dose  beta-blocker as pulse allows and initiation of heparin later tonight.   VAS US DOPPLER PRE CABG  Result Date: 11/18/2020 PREOPERATIVE VASCULAR EVALUATION  Indications: Pre-CABG. Performing Technologist: Carlos Levering RVT Supporting Technologist: Vonzell Schlatter  Examination Guidelines: A complete evaluation includes B-mode imaging, spectral Doppler, color Doppler, and power Doppler as needed of all accessible portions of each vessel. Bilateral testing is considered an integral part of a complete examination. Limited examinations for reoccurring indications may be performed as noted.  Right Carotid Findings: +----------+--------+--------+--------+------------+--------+           PSV cm/sEDV cm/sStenosisDescribe    Comments +----------+--------+--------+--------+------------+--------+ CCA Prox  101     14                                   +----------+--------+--------+--------+------------+--------+  CCA Distal108     14                                   +----------+--------+--------+--------+------------+--------+ ICA Prox  130     33      1-39%   heterogenous         +----------+--------+--------+--------+------------+--------+ ICA Distal71      17                                   +----------+--------+--------+--------+------------+--------+ ECA       159                                          +----------+--------+--------+--------+------------+--------+ Portions of this table do not appear on this page. +----------+--------+-------+----------------+------------+           PSV cm/sEDV cmsDescribe        Arm Pressure +----------+--------+-------+----------------+------------+ [id number]            Multiphasic, WNL             +----------+--------+-------+----------------+------------+ +---------+--------+--+--------+--+---------+ VertebralPSV cm/s60EDV cm/s13Antegrade +---------+--------+--+--------+--+---------+ Left Carotid Findings:  +----------+--------+--------+--------+------------+--------+           PSV cm/sEDV cm/sStenosisDescribe    Comments +----------+--------+--------+--------+------------+--------+ CCA Prox  87      17                                   +----------+--------+--------+--------+------------+--------+ CCA Distal86      19                                   +----------+--------+--------+--------+------------+--------+ ICA Prox  106     36      1-39%   heterogenous         +----------+--------+--------+--------+------------+--------+ ICA Distal105     33                                   +----------+--------+--------+--------+------------+--------+ ECA       133                                          +----------+--------+--------+--------+------------+--------+ +----------+--------+--------+----------------+------------+ SubclavianPSV cm/sEDV cm/sDescribe        Arm Pressure +----------+--------+--------+----------------+------------+           119             Multiphasic, WNL             +----------+--------+--------+----------------+------------+ +---------+--------+--+--------+--+---------+ VertebralPSV cm/s70EDV cm/s19Antegrade +---------+--------+--+--------+--+---------+  ABI Findings: +--------+------------------+-----+---------+--------+ Right   Rt Pressure (mmHg)IndexWaveform Comment  +--------+------------------+-----+---------+--------+ [id number]                    triphasic         +--------+------------------+-----+---------+--------+ PTA     148               0.90 triphasic         +--------+------------------+-----+---------+--------+ DP      136  0.83 triphasic         +--------+------------------+-----+---------+--------+ +--------+------------------+-----+---------+-------+ Left    Lt Pressure (mmHg)IndexWaveform Comment +--------+------------------+-----+---------+-------+ [id number]                     triphasic        +--------+------------------+-----+---------+-------+ PTA     149               0.91 triphasic        +--------+------------------+-----+---------+-------+ DP      158               0.96 triphasic        +--------+------------------+-----+---------+-------+ +-------+---------------+----------------+ ABI/TBIToday's ABI/TBIPrevious ABI/TBI +-------+---------------+----------------+ Right  0.9                             +-------+---------------+----------------+ Left   0.96                            +-------+---------------+----------------+  Right Doppler Findings: +--------+--------+-----+---------+--------+ Site    PressureIndexDoppler  Comments +--------+--------+-----+---------+--------+ [id number]          triphasic         +--------+--------+-----+---------+--------+ Radial               triphasic         +--------+--------+-----+---------+--------+ Ulnar                triphasic         +--------+--------+-----+---------+--------+  Left Doppler Findings: +--------+--------+-----+---------+--------+ Site    PressureIndexDoppler  Comments +--------+--------+-----+---------+--------+ [id number]          triphasic         +--------+--------+-----+---------+--------+ Radial               triphasic         +--------+--------+-----+---------+--------+ Ulnar                triphasic         +--------+--------+-----+---------+--------+  Summary: Right Carotid: Velocities in the right ICA are consistent with a 1-39% stenosis. Left Carotid: Velocities in the left ICA are consistent with a 1-39% stenosis. Vertebrals:  Bilateral vertebral arteries demonstrate antegrade flow. Subclavians: Normal flow hemodynamics were seen in bilateral subclavian              arteries. Right ABI: Resting right ankle-brachial index indicates mild right lower extremity arterial disease. Left ABI: Resting left ankle-brachial index is within  normal range. No evidence of significant left lower extremity arterial disease. Right Upper Extremity: Doppler waveform obliterate with right radial compression. Doppler waveform obliterate with right ulnar compression. Left Upper Extremity: Doppler waveform obliterate with left radial compression. Doppler waveforms remain within normal limits with left ulnar compression.     Preliminary     Cardiac Studies    Dist RCA lesion is 90% stenosed.  RPAV lesion is 60% stenosed.  Mid RCA lesion is 30% stenosed.  Ost Cx to Prox Cx lesion is 95% stenosed.  Prox LAD lesion is 50% stenosed.  1st Diag lesion is 50% stenosed.  Mid LAD-1 lesion is 70% stenosed.  Mid LAD-2 lesion is 80% stenosed.  Dist LAD-1 lesion is 85% stenosed.  Dist LAD-2 lesion is 85% stenosed.  The left ventricular systolic function is normal.  LV end diastolic pressure is low.   Severe multivessel CAD with evidence for coronary calcification involving the left coronary system.   There is significant segmental LAD  stenoses of 50% proximally also involving the first diagonal vessel followed by 70% stenosis in the proximal to mid segment, and segmental 80 to 85% mid stenosis.  Left circumflex stenosis 95% ostial stenosis and reduced antegrade flow secondary to competitive filling via RCA collateralization.  Dominant RCA with significant vessel tortuosity with 30% mid stenosis, focal 90% stenosis immediately proximal to the takeoff of the PDA vessel followed by 60% stenosis of the continuation branch PLA with collateralization to the distal circumflex from the distal RCA branches.  Preserved LV function without focal segmental wall motion abnormalities.  LVEDP 6 mm.  RECOMMENDATION: Recommend surgical consultation for CABG revascularization.  With the patient's high-grade distal RCA stenosis collateralizing the circumflex vessel with 95% ostial stenosis and with diffuse LAD disease, recommend the patient stay in  the hospital.  Plan for increased medical therapy with addition of nitrates, very low dose beta-blocker as pulse allows and initiation of heparin later tonight.   Patient Profile     79 y.o. 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 Plan for CABG on Monday 12/6.   Assessment & Plan   Active Problems:   Abnormal nuclear stress test   Coronary artery disease of native artery of native heart with stable angina pectoris (HCC)   CAD (coronary artery disease), native coronary artery   Has been seen by Dr. Lily Peer, plan for CABG on Monday. Continue ASA 81 mg daily, atorvastatin 80 mg daily, metoprolol tartrate 12.5 mg BID. Bradycardic, cannot titrate BB.    For questions or updates, please contact [place] Please consult [email or web address] for contact info under        Signed, Elouise Munroe, MD  11/19/2020, 9:01 AM

## 2020-11-19 NOTE — Progress Notes (Signed)
[place] for Heparin  Indication: chest pain/ACS, CABG 12/6  Allergies  Allergen Reactions  . Hydrocodone     pts states made him sick.  . Pneumococcal Vaccine     REACTION: significant arm swelling, redness    Patient Measurements: Height: 5\' 10"  (177.8 cm) Weight: 70.4 kg (155 lb 1.6 oz) IBW/kg (Calculated) : 73   Vital Signs: Temp: 98.5 F (36.9 C) (12/04 0428) Temp Source: Oral (12/04 0428) BP: 113/58 (12/04 0428) Pulse Rate: 49 (12/04 0428)  Labs: Recent Labs    11/19/20 0324  HEPARINUNFRC 0.21*  CREATININE 1.11    Estimated Creatinine Clearance: 54.6 mL/min (by C-G formula based on SCr of 1.11 mg/dL).   Medical History: Past Medical History:  Diagnosis Date  . Benign neoplasm of colon   . ED (erectile dysfunction)   . IBS (irritable bowel syndrome)   . Melanoma ([place])    history of  . Other and unspecified hyperlipidemia   . Other chronic nonalcoholic liver disease   . PONV (postoperative nausea and vomiting)   . Unspecified essential hypertension   . Unspecified vitamin D deficiency       Assessment: 79yom Hx HLD, NASH and positive stress test admitted post cath with multivessel CAD> TCTS consult Will begin heparin drip 9hr post sheath removed  Tr band removed  - no issues   12/4 AM update:  Heparin level low  Goal of Therapy:  Heparin level 0.3-0.7 units/ml Monitor platelets by anticoagulation protocol: Yes   Plan:  Inc heparin to 1200 units/hr 1300 heparin level  CABG 12/6   Narda Bonds, PharmD, BCPS Clinical Pharmacist Phone: [phone]

## 2020-11-19 NOTE — Progress Notes (Signed)
ANTICOAGULATION CONSULT NOTE - [place] for Heparin  Indication: chest pain/ACS >> CABG 12/6  Allergies  Allergen Reactions  . Hydrocodone     pts states made him sick.  . Pneumococcal Vaccine     REACTION: significant arm swelling, redness    Patient Measurements: Height: 5\' 10"  (177.8 cm) Weight: 70.4 kg (155 lb 1.6 oz) IBW/kg (Calculated) : 73   Vital Signs: Temp: 98.2 F (36.8 C) (12/04 1138) Temp Source: Oral (12/04 1138) BP: 129/60 (12/04 1138) Pulse Rate: 65 (12/04 1138)  Labs: Recent Labs    11/19/20 0324 11/19/20 1244  HGB 12.8*  --   HCT 37.5*  --   PLT 152  --   HEPARINUNFRC 0.21* 0.52  CREATININE 1.11  --     Estimated Creatinine Clearance: 54.6 mL/min (by C-G formula based on SCr of 1.11 mg/dL).   Medical History: Past Medical History:  Diagnosis Date  . Benign neoplasm of colon   . ED (erectile dysfunction)   . IBS (irritable bowel syndrome)   . Melanoma ([place])    history of  . Other and unspecified hyperlipidemia   . Other chronic nonalcoholic liver disease   . PONV (postoperative nausea and vomiting)   . Unspecified essential hypertension   . Unspecified vitamin D deficiency       Assessment: 79 yo M s/p cath with multivessel CAD> TCTS consult with plans for CABG 12/6. Continues on heparin with therapeutic level.  Goal of Therapy:  Heparin level 0.3-0.7 units/ml Monitor platelets by anticoagulation protocol: Yes   Plan:  Continue Heparin at 1200 units/hr. Daily heparin level and CBC Monitor s/s bleeding    [employer], Pharm.D., BCPS Clinical Pharmacist  **Pharmacist phone directory can be found on [email or web address] listed under [place].  11/19/2020 2:16 PM

## 2020-11-19 NOTE — Plan of Care (Signed)
  Problem: Education: Goal: Knowledge of General Education information will improve Description: Including pain rating scale, medication(s)/side effects and non-pharmacologic comfort measures Outcome: Progressing   Problem: Coping: Goal: Level of anxiety will decrease Outcome: Progressing   

## 2020-11-19 NOTE — Progress Notes (Signed)
CARDIAC REHAB PHASE I   PRE:  Rate/Rhythm: 60 SR  BP:  Supine:   Sitting: 130/60  Standing:    SaO2: 97 RA  MODE:  Ambulation: 1000 ft   POST:  Rate/Rhythm: 67 SR  BP:  Supine:   Sitting: 129/60  Standing:    SaO2: 98 RA Assisted X 1 to ambulate. Gait steady with his shoes on. He states that he had hammer toe surgery 7/21 and that he has to wear his shoes to walk.States that it is still sore. VS stable. Pt to recliner after walk. Completed pre-op [facility] education with pt. We discussed sternal precautions, walking post-op, getting out of bed,need for someone to stay with him the first week after discharge. I gave him IS and instructions on proper use. Gave pt heart surgery booklet, pt care guide and encouraged him to watch going for heart surgery video. He voices understanding.Pt lives alone but states that he probaly can get his nephew to stay with him at discharge. We will follow pt postop as ordered.  Rodney Langton RN 11/19/2020 11:43 AM

## 2020-11-19 NOTE — Progress Notes (Signed)
Patient sleeping and briefly awakened. He denies chest pressure, pain or shortness of breath. He met Dr. Kipp Brood last evening. Scheduled for CABG on Monday 12/06.

## 2020-11-20 DIAGNOSIS — I25119 Atherosclerotic heart disease of native coronary artery with unspecified angina pectoris: Secondary | ICD-10-CM | POA: Diagnosis not present

## 2020-11-20 LAB — ABO/RH: ABO/RH(D): AB POS

## 2020-11-20 LAB — SURGICAL PCR SCREEN
MRSA, PCR: NEGATIVE
Staphylococcus aureus: NEGATIVE

## 2020-11-20 LAB — URINALYSIS, ROUTINE W REFLEX MICROSCOPIC
Bilirubin Urine: NEGATIVE
Glucose, UA: NEGATIVE mg/dL
Hgb urine dipstick: NEGATIVE
Ketones, ur: NEGATIVE mg/dL
Leukocytes,Ua: NEGATIVE
Nitrite: NEGATIVE
Protein, ur: NEGATIVE mg/dL
Specific Gravity, Urine: 1.01 (ref 1.005–1.030)
pH: 7 (ref 5.0–8.0)

## 2020-11-20 LAB — BLOOD GAS, ARTERIAL
Acid-base deficit: 0.4 mmol/L (ref 0.0–2.0)
Bicarbonate: 23.2 mmol/L (ref 20.0–28.0)
Drawn by: 53547
FIO2: 21
O2 Saturation: 96.5 %
Patient temperature: 37.5
pCO2 arterial: 35.2 mmHg (ref 32.0–48.0)
pH, Arterial: 7.436 (ref 7.350–7.450)
pO2, Arterial: 83.1 mmHg (ref 83.0–108.0)

## 2020-11-20 LAB — CBC
HCT: 34.9 % — ABNORMAL LOW (ref 39.0–52.0)
Hemoglobin: 12.7 g/dL — ABNORMAL LOW (ref 13.0–17.0)
MCH: 33.6 pg (ref 26.0–34.0)
MCHC: 36.4 g/dL — ABNORMAL HIGH (ref 30.0–36.0)
MCV: 92.3 fL (ref 80.0–100.0)
Platelets: 130 10*3/uL — ABNORMAL LOW (ref 150–400)
RBC: 3.78 MIL/uL — ABNORMAL LOW (ref 4.22–5.81)
RDW: 12.1 % (ref 11.5–15.5)
WBC: 7.8 10*3/uL (ref 4.0–10.5)
nRBC: 0 % (ref 0.0–0.2)

## 2020-11-20 LAB — HEPARIN LEVEL (UNFRACTIONATED): Heparin Unfractionated: 0.25 IU/mL — ABNORMAL LOW (ref 0.30–0.70)

## 2020-11-20 LAB — PROTIME-INR
INR: 1.1 (ref 0.8–1.2)
Prothrombin Time: 14.1 seconds (ref 11.4–15.2)

## 2020-11-20 LAB — BASIC METABOLIC PANEL
Anion gap: 11 (ref 5–15)
BUN: 13 mg/dL (ref 8–23)
CO2: 21 mmol/L — ABNORMAL LOW (ref 22–32)
Calcium: 8.8 mg/dL — ABNORMAL LOW (ref 8.9–10.3)
Chloride: 106 mmol/L (ref 98–111)
Creatinine, Ser: 0.99 mg/dL (ref 0.61–1.24)
GFR, Estimated: >60 mL/min (ref >60)
Glucose, Bld: 107 mg/dL — ABNORMAL HIGH (ref 70–99)
Potassium: 3.5 mmol/L (ref 3.5–5.1)
Sodium: 138 mmol/L (ref 135–145)

## 2020-11-20 LAB — HEMOGLOBIN A1C
Hgb A1c MFr Bld: 4.7 % — ABNORMAL LOW (ref 4.8–5.6)
Mean Plasma Glucose: 88.19 mg/dL

## 2020-11-20 LAB — PREPARE RBC (CROSSMATCH)

## 2020-11-20 LAB — APTT: aPTT: 158 seconds — ABNORMAL HIGH (ref 24–36)

## 2020-11-20 LAB — GLUCOSE, CAPILLARY: Glucose-Capillary: 96 mg/dL (ref 70–99)

## 2020-11-20 MED ORDER — BENZONATATE 100 MG PO CAPS
100.0000 mg | ORAL_CAPSULE | Freq: Three times a day (TID) | ORAL | Status: DC | PRN
  Administered 2020-11-20 – 2020-11-21 (×4): 100 mg via ORAL
  Filled 2020-11-20 (×4): qty 1

## 2020-11-20 MED ORDER — METOPROLOL TARTRATE 12.5 MG HALF TABLET
12.5000 mg | ORAL_TABLET | Freq: Once | ORAL | Status: AC
  Administered 2020-11-21: 12.5 mg via ORAL
  Filled 2020-11-20: qty 1

## 2020-11-20 MED ORDER — CHLORHEXIDINE GLUCONATE 0.12 % MT SOLN
15.0000 mL | Freq: Once | OROMUCOSAL | Status: AC
  Administered 2020-11-21: 15 mL via OROMUCOSAL
  Filled 2020-11-20: qty 15

## 2020-11-20 MED ORDER — BISACODYL 5 MG PO TBEC
5.0000 mg | DELAYED_RELEASE_TABLET | Freq: Once | ORAL | Status: AC
  Administered 2020-11-20: 5 mg via ORAL
  Filled 2020-11-20: qty 1

## 2020-11-20 MED ORDER — CHLORHEXIDINE GLUCONATE CLOTH 2 % EX PADS
6.0000 | MEDICATED_PAD | Freq: Once | CUTANEOUS | Status: AC
  Administered 2020-11-20: 6 via TOPICAL

## 2020-11-20 MED ORDER — TEMAZEPAM 15 MG PO CAPS
15.0000 mg | ORAL_CAPSULE | Freq: Once | ORAL | Status: AC | PRN
  Administered 2020-11-20: 15 mg via ORAL
  Filled 2020-11-20: qty 1

## 2020-11-20 MED ORDER — CHLORHEXIDINE GLUCONATE CLOTH 2 % EX PADS
6.0000 | MEDICATED_PAD | Freq: Once | CUTANEOUS | Status: AC
  Administered 2020-11-21: 6 via TOPICAL

## 2020-11-20 NOTE — Progress Notes (Signed)
[facility] [place] for Heparin  Indication: chest pain/ACS  Allergies  Allergen Reactions  . Hydrocodone     pts states made him sick.  . Pneumococcal Vaccine     REACTION: significant arm swelling, redness    Patient Measurements: Height: 5\' 10"  (177.8 cm) Weight: 70.4 kg (155 lb 1.6 oz) IBW/kg (Calculated) : 73   Vital Signs: Temp: 99.1 F (37.3 C) (12/04 2312) Temp Source: Oral (12/04 2312) BP: 136/58 (12/04 2312) Pulse Rate: 65 (12/04 2312)  Labs: Recent Labs    11/19/20 0324 11/19/20 1244 11/20/20 0146  HGB 12.8*  --   --   HCT 37.5*  --   --   PLT 152  --   --   HEPARINUNFRC 0.21* 0.52 0.25*  CREATININE 1.11  --   --     Estimated Creatinine Clearance: 54.6 mL/min (by C-G formula based on SCr of 1.11 mg/dL).  Assessment: 79 yo Male s/p cath with multivessel CAD, awaiting CABG, for heparin Goal of Therapy:  Heparin level 0.3-0.7 units/ml Monitor platelets by anticoagulation protocol: Yes   Plan:  Increase Heparin 1300 units/hr  Phillis Knack, PharmD, BCPS

## 2020-11-20 NOTE — Plan of Care (Signed)
  Problem: Nutrition: Goal: Adequate nutrition will be maintained Outcome: Completed/Met   Problem: Elimination: Goal: Will not experience complications related to urinary retention Outcome: Completed/Met   Problem: Pain Managment: Goal: General experience of comfort will improve Outcome: Completed/Met   Problem: Safety: Goal: Ability to remain free from injury will improve Outcome: Completed/Met   Problem: Skin Integrity: Goal: Risk for impaired skin integrity will decrease Outcome: Completed/Met

## 2020-11-20 NOTE — Anesthesia Preprocedure Evaluation (Addendum)
Anesthesia Evaluation  Patient identified by MRN, date of birth, ID band Patient awake    Reviewed: Allergy & Precautions, NPO status , Patient's Chart, lab work & pertinent test results  History of Anesthesia Complications Negative for: history of anesthetic complications  Airway Mallampati: II  TM Distance: >3 FB Neck ROM: Full    Dental  (+) Edentulous Upper, Dental Advisory Given, Chipped   Pulmonary  11/15/2020 SARS coronavirus NEG   breath sounds clear to auscultation       Cardiovascular hypertension, + angina + CAD (severe 3v ascad)   Rhythm:Regular Rate:Normal  10/2020 ECHO: EF 55-60%, mild AI   Neuro/Psych negative neurological ROS     GI/Hepatic negative GI ROS, NASH   Endo/Other  negative endocrine ROS  Renal/GU negative Renal ROS     Musculoskeletal   Abdominal   Peds  Hematology negative hematology ROS (+)   Anesthesia Other Findings   Reproductive/Obstetrics                            Anesthesia Physical Anesthesia Plan  ASA: III  Anesthesia Plan: General   Post-op Pain Management:    Induction: Intravenous  PONV Risk Score and Plan: 2 and Treatment may vary due to age or medical condition  Airway Management Planned: Oral ETT  Additional Equipment: Arterial line, CVP and Ultrasound Guidance Line Placement  Intra-op Plan:   Post-operative Plan: Post-operative intubation/ventilation  Informed Consent: I have reviewed the patients History and Physical, chart, labs and discussed the procedure including the risks, benefits and alternatives for the proposed anesthesia with the patient or authorized representative who has indicated his/her understanding and acceptance.     Dental advisory given  Plan Discussed with: CRNA and Surgeon  Anesthesia Plan Comments:       Anesthesia Quick Evaluation

## 2020-11-20 NOTE — Progress Notes (Signed)
Progress Note  Patient Name: Eddie Fox Date of Encounter: 11/20/2020  Primary Cardiologist: Elouise Munroe, MD   Subjective   Feels well. We discussed details of CABG and recovery to the best of my knowledge.  He is considering going hunting before the end of the year, I have cautioned him against this due to impending sternotomy which will require healing.  Inpatient Medications    Scheduled Meds: . amLODipine  10 mg Oral Daily  . aspirin  81 mg Oral Daily  . atorvastatin  80 mg Oral QPM  . [START ON 11/21/2020] chlorhexidine  15 mL Mouth/Throat Once  . Chlorhexidine Gluconate Cloth  6 each Topical Once   And  . [START ON 11/21/2020] Chlorhexidine Gluconate Cloth  6 each Topical Once  . [START ON 11/21/2020] epinephrine  0-10 mcg/min Intravenous To OR  . [START ON 11/21/2020] heparin-papaverine-plasmalyte irrigation   Irrigation To OR  . [START ON 11/21/2020] insulin   Intravenous To OR  . isosorbide mononitrate  30 mg Oral Daily  . [START ON 11/21/2020] [facility] Blood Cardioplegia vial (lidocaine/magnesium/mannitol 0.26g-4g-6.4g)   Intracoronary Once  . metoprolol tartrate  12.5 mg Oral BID  . [START ON 11/21/2020] metoprolol tartrate  12.5 mg Oral Once  . [START ON 11/21/2020] phenylephrine  30-200 mcg/min Intravenous To OR  . [START ON 11/21/2020] potassium chloride  80 mEq Other To OR  . sodium chloride flush  3 mL Intravenous Q12H  . sodium chloride flush  3 mL Intravenous Q12H  . [START ON 11/21/2020] tranexamic acid  15 mg/kg Intravenous To OR  . [START ON 11/21/2020] tranexamic acid  2 mg/kg Intracatheter To OR   Continuous Infusions: . sodium chloride Stopped (11/19/20 2316)  . sodium chloride    . [START ON 11/21/2020] cefUROXime (ZINACEF)  IV    . [START ON 11/21/2020] cefUROXime (ZINACEF)  IV    . [START ON 11/21/2020] dexmedetomidine    . [START ON 11/21/2020] heparin 30,000 units/NS 1000 mL solution for CELLSAVER    . heparin 1,300 Units/hr (11/20/20 1250)  .  [START ON 11/21/2020] milrinone    . [START ON 11/21/2020] nitroGLYCERIN    . [START ON 11/21/2020] norepinephrine    . [START ON 11/21/2020] tranexamic acid (CYKLOKAPRON) infusion ([facility])    . [START ON 11/21/2020] vancomycin     PRN Meds: sodium chloride, acetaminophen, ALPRAZolam, benzonatate, fluticasone, guaiFENesin-dextromethorphan, nitroGLYCERIN, ondansetron (ZOFRAN) IV, sodium chloride flush, temazepam, zolpidem   Vital Signs    Vitals:   11/20/20 0557 11/20/20 0634 11/20/20 0801 11/20/20 0919  BP:  136/60 (!) 146/63 (!) 119/94  Pulse:  60 (!) 56 61  Resp:  18 18   Temp:  98.9 F (37.2 C) 99.6 F (37.6 C)   TempSrc:  Oral Oral   SpO2:  93% 94%   Weight: 71.6 kg     Height:        Intake/Output Summary (Last 24 hours) at 11/20/2020 1333 Last data filed at 11/20/2020 0929 Gross per 24 hour  Intake 685.72 ml  Output 2675 ml  Net -1989.28 ml   Filed Weights   11/18/20 0535 11/19/20 0428 11/20/20 0557  Weight: 74.8 kg 70.4 kg 71.6 kg    Telemetry    Sinus bradycardia- Personally Reviewed  ECG  No new- Personally Reviewed  Physical Exam   GEN: No acute distress.   Neck: No JVD Cardiac:  Regular rhythm and bradycardic Respiratory: Clear to auscultation bilaterally. GI: Soft, nontender, non-distended  MS: No edema; No deformity.  Neuro:  Nonfocal  Psych: Normal affect    Labs    Chemistry Recent Labs  Lab 11/19/20 0324  NA 141  K 3.9  CL 111  CO2 22  GLUCOSE 95  BUN 13  CREATININE 1.11  CALCIUM 8.6*  GFRNONAA >60  ANIONGAP 8     Hematology Recent Labs  Lab 11/19/20 0324  WBC 5.9  RBC 3.93*  HGB 12.8*  HCT 37.5*  MCV 95.4  MCH 32.6  MCHC 34.1  RDW 12.3  PLT 152    Cardiac EnzymesNo results for input(s): TROPONINI in the last 168 hours. No results for input(s): TROPIPOC in the last 168 hours.   BNPNo results for input(s): BNP, PROBNP in the last 168 hours.   DDimer No results for input(s): DDIMER in the last 168 hours.   Radiology     DG Chest 2 View  Result Date: 11/19/2020 CLINICAL DATA:  Pleural effusion EXAM: CHEST - 2 VIEW COMPARISON:  Chest x-ray dated 12/23/2009 FINDINGS: Heart size and mediastinal contours are within normal limits. Lungs are clear. No pleural effusion or pneumothorax is seen. No acute appearing osseous abnormality. IMPRESSION: No active cardiopulmonary disease. No evidence of pneumonia or pulmonary edema. No pleural effusion. Electronically Signed   By: Franki Cabot M.D.   On: 11/19/2020 06:42   VAS US DOPPLER PRE CABG  Result Date: 11/19/2020 PREOPERATIVE VASCULAR EVALUATION  Indications: Pre-CABG. Performing Technologist: Carlos Levering RVT Supporting Technologist: Vonzell Schlatter  Examination Guidelines: A complete evaluation includes B-mode imaging, spectral Doppler, color Doppler, and power Doppler as needed of all accessible portions of each vessel. Bilateral testing is considered an integral part of a complete examination. Limited examinations for reoccurring indications may be performed as noted.  Right Carotid Findings: +----------+--------+--------+--------+------------+--------+           PSV cm/sEDV cm/sStenosisDescribe    Comments +----------+--------+--------+--------+------------+--------+ CCA Prox  101     14                                   +----------+--------+--------+--------+------------+--------+ CCA Distal108     14                                   +----------+--------+--------+--------+------------+--------+ ICA Prox  130     33      1-39%   heterogenous         +----------+--------+--------+--------+------------+--------+ ICA Distal71      17                                   +----------+--------+--------+--------+------------+--------+ ECA       159                                          +----------+--------+--------+--------+------------+--------+ Portions of this table do not appear on this page.  +----------+--------+-------+----------------+------------+           PSV cm/sEDV cmsDescribe        Arm Pressure +----------+--------+-------+----------------+------------+ [id number]            Multiphasic, WNL             +----------+--------+-------+----------------+------------+ +---------+--------+--+--------+--+---------+ VertebralPSV cm/s60EDV cm/s13Antegrade +---------+--------+--+--------+--+---------+ Left Carotid Findings: +----------+--------+--------+--------+------------+--------+  PSV cm/sEDV cm/sStenosisDescribe    Comments +----------+--------+--------+--------+------------+--------+ CCA Prox  87      17                                   +----------+--------+--------+--------+------------+--------+ CCA Distal86      19                                   +----------+--------+--------+--------+------------+--------+ ICA Prox  106     36      1-39%   heterogenous         +----------+--------+--------+--------+------------+--------+ ICA Distal105     33                                   +----------+--------+--------+--------+------------+--------+ ECA       133                                          +----------+--------+--------+--------+------------+--------+ +----------+--------+--------+----------------+------------+ SubclavianPSV cm/sEDV cm/sDescribe        Arm Pressure +----------+--------+--------+----------------+------------+           119             Multiphasic, WNL             +----------+--------+--------+----------------+------------+ +---------+--------+--+--------+--+---------+ VertebralPSV cm/s70EDV cm/s19Antegrade +---------+--------+--+--------+--+---------+  ABI Findings: +--------+------------------+-----+---------+--------+ Right   Rt Pressure (mmHg)IndexWaveform Comment  +--------+------------------+-----+---------+--------+ [id number]                    triphasic          +--------+------------------+-----+---------+--------+ PTA     148               0.90 triphasic         +--------+------------------+-----+---------+--------+ DP      136               0.83 triphasic         +--------+------------------+-----+---------+--------+ +--------+------------------+-----+---------+-------+ Left    Lt Pressure (mmHg)IndexWaveform Comment +--------+------------------+-----+---------+-------+ [id number]                    triphasic        +--------+------------------+-----+---------+-------+ PTA     149               0.91 triphasic        +--------+------------------+-----+---------+-------+ DP      158               0.96 triphasic        +--------+------------------+-----+---------+-------+ +-------+---------------+----------------+ ABI/TBIToday's ABI/TBIPrevious ABI/TBI +-------+---------------+----------------+ Right  0.9                             +-------+---------------+----------------+ Left   0.96                            +-------+---------------+----------------+  Right Doppler Findings: +--------+--------+-----+---------+--------+ Site    PressureIndexDoppler  Comments +--------+--------+-----+---------+--------+ [id number]          triphasic         +--------+--------+-----+---------+--------+ Radial               triphasic         +--------+--------+-----+---------+--------+  Ulnar                triphasic         +--------+--------+-----+---------+--------+  Left Doppler Findings: +--------+--------+-----+---------+--------+ Site    PressureIndexDoppler  Comments +--------+--------+-----+---------+--------+ [id number]          triphasic         +--------+--------+-----+---------+--------+ Radial               triphasic         +--------+--------+-----+---------+--------+ Ulnar                triphasic         +--------+--------+-----+---------+--------+  Summary: Right Carotid:  Velocities in the right ICA are consistent with a 1-39% stenosis. Left Carotid: Velocities in the left ICA are consistent with a 1-39% stenosis. Vertebrals:  Bilateral vertebral arteries demonstrate antegrade flow. Subclavians: Normal flow hemodynamics were seen in bilateral subclavian              arteries. Right ABI: Resting right ankle-brachial index indicates mild right lower extremity arterial disease. Left ABI: Resting left ankle-brachial index is within normal range. No evidence of significant left lower extremity arterial disease. Right Upper Extremity: Doppler waveform obliterate with right radial compression. Doppler waveform obliterate with right ulnar compression. Left Upper Extremity: Doppler waveform obliterate with left radial compression. Doppler waveforms remain within normal limits with left ulnar compression.  Electronically signed by Harold Barban MD on 11/19/2020 at 5:37:31 PM.    Final     Cardiac Studies    Dist RCA lesion is 90% stenosed.  RPAV lesion is 60% stenosed.  Mid RCA lesion is 30% stenosed.  Ost Cx to Prox Cx lesion is 95% stenosed.  Prox LAD lesion is 50% stenosed.  1st Diag lesion is 50% stenosed.  Mid LAD-1 lesion is 70% stenosed.  Mid LAD-2 lesion is 80% stenosed.  Dist LAD-1 lesion is 85% stenosed.  Dist LAD-2 lesion is 85% stenosed.  The left ventricular systolic function is normal.  LV end diastolic pressure is low.   Severe multivessel CAD with evidence for coronary calcification involving the left coronary system.   There is significant segmental LAD stenoses of 50% proximally also involving the first diagonal vessel followed by 70% stenosis in the proximal to mid segment, and segmental 80 to 85% mid stenosis.  Left circumflex stenosis 95% ostial stenosis and reduced antegrade flow secondary to competitive filling via RCA collateralization.  Dominant RCA with significant vessel tortuosity with 30% mid stenosis, focal 90% stenosis  immediately proximal to the takeoff of the PDA vessel followed by 60% stenosis of the continuation branch PLA with collateralization to the distal circumflex from the distal RCA branches.  Preserved LV function without focal segmental wall motion abnormalities.  LVEDP 6 mm.  RECOMMENDATION: Recommend surgical consultation for CABG revascularization.  With the patient's high-grade distal RCA stenosis collateralizing the circumflex vessel with 95% ostial stenosis and with diffuse LAD disease, recommend the patient stay in the hospital.  Plan for increased medical therapy with addition of nitrates, very low dose beta-blocker as pulse allows and initiation of heparin later tonight.   Patient Profile     79 y.o. 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 Plan for CABG on Monday 12/6.   Assessment & Plan   Active Problems:   Abnormal nuclear stress test   Coronary artery disease of native artery of native  heart with stable angina pectoris (HCC)   CAD (coronary artery disease), native coronary artery   Plan for CABG on Monday.  Continue ASA 81 mg daily, atorvastatin 80 mg daily, metoprolol tartrate 12.5 mg twice daily, cannot titrate beta-blocker due to bradycardia.  All questions answered to the best my ability.  I look forward to seeing the patient again in the office when he has recovered from CABG.   For questions or updates, please contact [place] Please consult [email or web address] for contact info under        Signed, Elouise Munroe, MD  11/20/2020, 1:33 PM

## 2020-11-20 NOTE — Progress Notes (Addendum)
Patient denies chest pressure, pain or shortness of breath. He does have a non productive cough this am. Scheduled for CABG in am with Dr.Lightfoot.

## 2020-11-21 ENCOUNTER — Inpatient Hospital Stay (HOSPITAL_COMMUNITY): Payer: Medicare Other | Admitting: Certified Registered Nurse Anesthetist

## 2020-11-21 ENCOUNTER — Inpatient Hospital Stay (HOSPITAL_COMMUNITY)
Admission: AD | Disposition: A | Discharge status: Home Health | Payer: Self-pay | Source: Home / Self Care | Attending: Thoracic Surgery (Cardiothoracic Vascular Surgery)

## 2020-11-21 ENCOUNTER — Inpatient Hospital Stay (HOSPITAL_COMMUNITY): Payer: Medicare Other

## 2020-11-21 DIAGNOSIS — Z951 Presence of aortocoronary bypass graft: Secondary | ICD-10-CM

## 2020-11-21 HISTORY — DX: Presence of aortocoronary bypass graft: Z95.1

## 2020-11-21 HISTORY — PX: TEE WITHOUT CARDIOVERSION: SHX5443

## 2020-11-21 HISTORY — PX: CORONARY ARTERY BYPASS GRAFT: SHX141

## 2020-11-21 LAB — BASIC METABOLIC PANEL
Anion gap: 7 (ref 5–15)
BUN: 14 mg/dL (ref 8–23)
CO2: 20 mmol/L — ABNORMAL LOW (ref 22–32)
Calcium: 7.5 mg/dL — ABNORMAL LOW (ref 8.9–10.3)
Chloride: 112 mmol/L — ABNORMAL HIGH (ref 98–111)
Creatinine, Ser: 1.06 mg/dL (ref 0.61–1.24)
GFR, Estimated: >60 mL/min (ref >60)
Glucose, Bld: 140 mg/dL — ABNORMAL HIGH (ref 70–99)
Potassium: 4.4 mmol/L (ref 3.5–5.1)
Sodium: 139 mmol/L (ref 135–145)

## 2020-11-21 LAB — ECHO INTRAOPERATIVE TEE
AV Mean grad: 3 mmHg
AV Peak grad: 7.1 mmHg
Ao pk vel: 1.33 m/s
Height: 70 in
S' Lateral: 2.4 cm
Weight: 2500.8 oz

## 2020-11-21 LAB — POCT I-STAT 7, (LYTES, BLD GAS, ICA,H+H)
Acid-Base Excess: 1 mmol/L (ref 0.0–2.0)
Acid-Base Excess: 4 mmol/L — ABNORMAL HIGH (ref 0.0–2.0)
Acid-Base Excess: 1 mmol/L (ref 0.0–2.0)
Acid-Base Excess: 0 mmol/L (ref 0.0–2.0)
Acid-base deficit: 2 mmol/L (ref 0.0–2.0)
Acid-base deficit: 4 mmol/L — ABNORMAL HIGH (ref 0.0–2.0)
Acid-base deficit: 6 mmol/L — ABNORMAL HIGH (ref 0.0–2.0)
Acid-base deficit: 8 mmol/L — ABNORMAL HIGH (ref 0.0–2.0)
Acid-base deficit: 8 mmol/L — ABNORMAL HIGH (ref 0.0–2.0)
Acid-base deficit: 7 mmol/L — ABNORMAL HIGH (ref 0.0–2.0)
Bicarbonate: 23.7 mmol/L (ref 20.0–28.0)
Bicarbonate: 26.1 mmol/L (ref 20.0–28.0)
Bicarbonate: 27.5 mmol/L (ref 20.0–28.0)
Bicarbonate: 23.2 mmol/L (ref 20.0–28.0)
Bicarbonate: 23.5 mmol/L (ref 20.0–28.0)
Bicarbonate: 20.9 mmol/L (ref 20.0–28.0)
Bicarbonate: 19.9 mmol/L — ABNORMAL LOW (ref 20.0–28.0)
Bicarbonate: 18.6 mmol/L — ABNORMAL LOW (ref 20.0–28.0)
Bicarbonate: 18.2 mmol/L — ABNORMAL LOW (ref 20.0–28.0)
Bicarbonate: 18.7 mmol/L — ABNORMAL LOW (ref 20.0–28.0)
Calcium, Ion: 1.24 mmol/L (ref 1.15–1.40)
Calcium, Ion: 0.95 mmol/L — ABNORMAL LOW (ref 1.15–1.40)
Calcium, Ion: 1 mmol/L — ABNORMAL LOW (ref 1.15–1.40)
Calcium, Ion: 0.97 mmol/L — ABNORMAL LOW (ref 1.15–1.40)
Calcium, Ion: 1.24 mmol/L (ref 1.15–1.40)
Calcium, Ion: 1.17 mmol/L (ref 1.15–1.40)
Calcium, Ion: 1.15 mmol/L (ref 1.15–1.40)
Calcium, Ion: 1.14 mmol/L — ABNORMAL LOW (ref 1.15–1.40)
Calcium, Ion: 1.11 mmol/L — ABNORMAL LOW (ref 1.15–1.40)
Calcium, Ion: 1.14 mmol/L — ABNORMAL LOW (ref 1.15–1.40)
HCT: 39 % (ref 39.0–52.0)
HCT: 23 % — ABNORMAL LOW (ref 39.0–52.0)
HCT: 25 % — ABNORMAL LOW (ref 39.0–52.0)
HCT: 26 % — ABNORMAL LOW (ref 39.0–52.0)
HCT: 28 % — ABNORMAL LOW (ref 39.0–52.0)
HCT: 28 % — ABNORMAL LOW (ref 39.0–52.0)
HCT: 30 % — ABNORMAL LOW (ref 39.0–52.0)
HCT: 32 % — ABNORMAL LOW (ref 39.0–52.0)
HCT: 30 % — ABNORMAL LOW (ref 39.0–52.0)
HCT: 29 % — ABNORMAL LOW (ref 39.0–52.0)
Hemoglobin: 13.3 g/dL (ref 13.0–17.0)
Hemoglobin: 7.8 g/dL — ABNORMAL LOW (ref 13.0–17.0)
Hemoglobin: 8.5 g/dL — ABNORMAL LOW (ref 13.0–17.0)
Hemoglobin: 8.8 g/dL — ABNORMAL LOW (ref 13.0–17.0)
Hemoglobin: 9.5 g/dL — ABNORMAL LOW (ref 13.0–17.0)
Hemoglobin: 9.5 g/dL — ABNORMAL LOW (ref 13.0–17.0)
Hemoglobin: 10.2 g/dL — ABNORMAL LOW (ref 13.0–17.0)
Hemoglobin: 10.9 g/dL — ABNORMAL LOW (ref 13.0–17.0)
Hemoglobin: 10.2 g/dL — ABNORMAL LOW (ref 13.0–17.0)
Hemoglobin: 9.9 g/dL — ABNORMAL LOW (ref 13.0–17.0)
O2 Saturation: 100 %
O2 Saturation: 100 %
O2 Saturation: 84 %
O2 Saturation: 100 %
O2 Saturation: 100 %
O2 Saturation: 96 %
O2 Saturation: 96 %
O2 Saturation: 94 %
O2 Saturation: 93 %
O2 Saturation: 96 %
Patient temperature: 36.4
Patient temperature: 36.5
Patient temperature: 36.6
Patient temperature: 36.5
Patient temperature: 36.6
Potassium: 3.6 mmol/L (ref 3.5–5.1)
Potassium: 3.9 mmol/L (ref 3.5–5.1)
Potassium: 3.9 mmol/L (ref 3.5–5.1)
Potassium: 4.3 mmol/L (ref 3.5–5.1)
Potassium: 4.4 mmol/L (ref 3.5–5.1)
Potassium: 3.8 mmol/L (ref 3.5–5.1)
Potassium: 4.6 mmol/L (ref 3.5–5.1)
Potassium: 4.5 mmol/L (ref 3.5–5.1)
Potassium: 3.7 mmol/L (ref 3.5–5.1)
Potassium: 4 mmol/L (ref 3.5–5.1)
Sodium: 141 mmol/L (ref 135–145)
Sodium: 142 mmol/L (ref 135–145)
Sodium: 143 mmol/L (ref 135–145)
Sodium: 138 mmol/L (ref 135–145)
Sodium: 141 mmol/L (ref 135–145)
Sodium: 142 mmol/L (ref 135–145)
Sodium: 141 mmol/L (ref 135–145)
Sodium: 141 mmol/L (ref 135–145)
Sodium: 144 mmol/L (ref 135–145)
Sodium: 143 mmol/L (ref 135–145)
TCO2: 25 mmol/L (ref 22–32)
TCO2: 27 mmol/L (ref 22–32)
TCO2: 29 mmol/L (ref 22–32)
TCO2: 24 mmol/L (ref 22–32)
TCO2: 25 mmol/L (ref 22–32)
TCO2: 22 mmol/L (ref 22–32)
TCO2: 21 mmol/L — ABNORMAL LOW (ref 22–32)
TCO2: 20 mmol/L — ABNORMAL LOW (ref 22–32)
TCO2: 19 mmol/L — ABNORMAL LOW (ref 22–32)
TCO2: 20 mmol/L — ABNORMAL LOW (ref 22–32)
pCO2 arterial: 40.7 mmHg (ref 32.0–48.0)
pCO2 arterial: 36.7 mmHg (ref 32.0–48.0)
pCO2 arterial: 43.8 mmHg (ref 32.0–48.0)
pCO2 arterial: 28.2 mmHg — ABNORMAL LOW (ref 32.0–48.0)
pCO2 arterial: 34.3 mmHg (ref 32.0–48.0)
pCO2 arterial: 35.9 mmHg (ref 32.0–48.0)
pCO2 arterial: 38.8 mmHg (ref 32.0–48.0)
pCO2 arterial: 39.4 mmHg (ref 32.0–48.0)
pCO2 arterial: 36.2 mmHg (ref 32.0–48.0)
pCO2 arterial: 36.6 mmHg (ref 32.0–48.0)
pH, Arterial: 7.372 (ref 7.350–7.450)
pH, Arterial: 7.383 (ref 7.350–7.450)
pH, Arterial: 7.483 — ABNORMAL HIGH (ref 7.350–7.450)
pH, Arterial: 7.523 — ABNORMAL HIGH (ref 7.350–7.450)
pH, Arterial: 7.444 (ref 7.350–7.450)
pH, Arterial: 7.37 (ref 7.350–7.450)
pH, Arterial: 7.315 — ABNORMAL LOW (ref 7.350–7.450)
pH, Arterial: 7.279 — ABNORMAL LOW (ref 7.350–7.450)
pH, Arterial: 7.308 — ABNORMAL LOW (ref 7.350–7.450)
pH, Arterial: 7.315 — ABNORMAL LOW (ref 7.350–7.450)
pO2, Arterial: 327 mmHg — ABNORMAL HIGH (ref 83.0–108.0)
pO2, Arterial: 486 mmHg — ABNORMAL HIGH (ref 83.0–108.0)
pO2, Arterial: 50 mmHg — ABNORMAL LOW (ref 83.0–108.0)
pO2, Arterial: 300 mmHg — ABNORMAL HIGH (ref 83.0–108.0)
pO2, Arterial: 242 mmHg — ABNORMAL HIGH (ref 83.0–108.0)
pO2, Arterial: 82 mmHg — ABNORMAL LOW (ref 83.0–108.0)
pO2, Arterial: 84 mmHg (ref 83.0–108.0)
pO2, Arterial: 78 mmHg — ABNORMAL LOW (ref 83.0–108.0)
pO2, Arterial: 72 mmHg — ABNORMAL LOW (ref 83.0–108.0)
pO2, Arterial: 85 mmHg (ref 83.0–108.0)

## 2020-11-21 LAB — GLUCOSE, CAPILLARY
Glucose-Capillary: 101 mg/dL — ABNORMAL HIGH (ref 70–99)
Glucose-Capillary: 112 mg/dL — ABNORMAL HIGH (ref 70–99)
Glucose-Capillary: 129 mg/dL — ABNORMAL HIGH (ref 70–99)
Glucose-Capillary: 129 mg/dL — ABNORMAL HIGH (ref 70–99)
Glucose-Capillary: 134 mg/dL — ABNORMAL HIGH (ref 70–99)
Glucose-Capillary: 134 mg/dL — ABNORMAL HIGH (ref 70–99)
Glucose-Capillary: 138 mg/dL — ABNORMAL HIGH (ref 70–99)
Glucose-Capillary: 138 mg/dL — ABNORMAL HIGH (ref 70–99)
Glucose-Capillary: 141 mg/dL — ABNORMAL HIGH (ref 70–99)
Glucose-Capillary: 142 mg/dL — ABNORMAL HIGH (ref 70–99)
Glucose-Capillary: 146 mg/dL — ABNORMAL HIGH (ref 70–99)

## 2020-11-21 LAB — POCT I-STAT, CHEM 8
BUN: 11 mg/dL (ref 8–23)
BUN: 10 mg/dL (ref 8–23)
BUN: 11 mg/dL (ref 8–23)
BUN: 11 mg/dL (ref 8–23)
BUN: 12 mg/dL (ref 8–23)
BUN: 12 mg/dL (ref 8–23)
Calcium, Ion: 1.24 mmol/L (ref 1.15–1.40)
Calcium, Ion: 0.94 mmol/L — ABNORMAL LOW (ref 1.15–1.40)
Calcium, Ion: 1.23 mmol/L (ref 1.15–1.40)
Calcium, Ion: 0.97 mmol/L — ABNORMAL LOW (ref 1.15–1.40)
Calcium, Ion: 1.02 mmol/L — ABNORMAL LOW (ref 1.15–1.40)
Calcium, Ion: 1.25 mmol/L (ref 1.15–1.40)
Chloride: 105 mmol/L (ref 98–111)
Chloride: 100 mmol/L (ref 98–111)
Chloride: 104 mmol/L (ref 98–111)
Chloride: 104 mmol/L (ref 98–111)
Chloride: 105 mmol/L (ref 98–111)
Chloride: 106 mmol/L (ref 98–111)
Creatinine, Ser: 0.9 mg/dL (ref 0.61–1.24)
Creatinine, Ser: 0.8 mg/dL (ref 0.61–1.24)
Creatinine, Ser: 0.9 mg/dL (ref 0.61–1.24)
Creatinine, Ser: 0.8 mg/dL (ref 0.61–1.24)
Creatinine, Ser: 0.8 mg/dL (ref 0.61–1.24)
Creatinine, Ser: 0.9 mg/dL (ref 0.61–1.24)
Glucose, Bld: 107 mg/dL — ABNORMAL HIGH (ref 70–99)
Glucose, Bld: 100 mg/dL — ABNORMAL HIGH (ref 70–99)
Glucose, Bld: 104 mg/dL — ABNORMAL HIGH (ref 70–99)
Glucose, Bld: 131 mg/dL — ABNORMAL HIGH (ref 70–99)
Glucose, Bld: 113 mg/dL — ABNORMAL HIGH (ref 70–99)
Glucose, Bld: 125 mg/dL — ABNORMAL HIGH (ref 70–99)
HCT: 31 % — ABNORMAL LOW (ref 39.0–52.0)
HCT: 23 % — ABNORMAL LOW (ref 39.0–52.0)
HCT: 31 % — ABNORMAL LOW (ref 39.0–52.0)
HCT: 23 % — ABNORMAL LOW (ref 39.0–52.0)
HCT: 26 % — ABNORMAL LOW (ref 39.0–52.0)
HCT: 27 % — ABNORMAL LOW (ref 39.0–52.0)
Hemoglobin: 10.5 g/dL — ABNORMAL LOW (ref 13.0–17.0)
Hemoglobin: 10.5 g/dL — ABNORMAL LOW (ref 13.0–17.0)
Hemoglobin: 7.8 g/dL — ABNORMAL LOW (ref 13.0–17.0)
Hemoglobin: 7.8 g/dL — ABNORMAL LOW (ref 13.0–17.0)
Hemoglobin: 8.8 g/dL — ABNORMAL LOW (ref 13.0–17.0)
Hemoglobin: 9.2 g/dL — ABNORMAL LOW (ref 13.0–17.0)
Potassium: 3.6 mmol/L (ref 3.5–5.1)
Potassium: 3.7 mmol/L (ref 3.5–5.1)
Potassium: 3.9 mmol/L (ref 3.5–5.1)
Potassium: 4.3 mmol/L (ref 3.5–5.1)
Potassium: 4.3 mmol/L (ref 3.5–5.1)
Potassium: 4.4 mmol/L (ref 3.5–5.1)
Sodium: 140 mmol/L (ref 135–145)
Sodium: 140 mmol/L (ref 135–145)
Sodium: 141 mmol/L (ref 135–145)
Sodium: 138 mmol/L (ref 135–145)
Sodium: 139 mmol/L (ref 135–145)
Sodium: 140 mmol/L (ref 135–145)
TCO2: 24 mmol/L (ref 22–32)
TCO2: 23 mmol/L (ref 22–32)
TCO2: 28 mmol/L (ref 22–32)
TCO2: 24 mmol/L (ref 22–32)
TCO2: 23 mmol/L (ref 22–32)
TCO2: 24 mmol/L (ref 22–32)

## 2020-11-21 LAB — COMPREHENSIVE METABOLIC PANEL
ALT: 11 U/L (ref 0–44)
AST: 15 U/L (ref 15–41)
Albumin: 3.2 g/dL — ABNORMAL LOW (ref 3.5–5.0)
Alkaline Phosphatase: 35 U/L — ABNORMAL LOW (ref 38–126)
Anion gap: 8 (ref 5–15)
BUN: 11 mg/dL (ref 8–23)
CO2: 24 mmol/L (ref 22–32)
Calcium: 8.7 mg/dL — ABNORMAL LOW (ref 8.9–10.3)
Chloride: 108 mmol/L (ref 98–111)
Creatinine, Ser: 1.13 mg/dL (ref 0.61–1.24)
GFR, Estimated: >60 mL/min (ref >60)
Glucose, Bld: 96 mg/dL (ref 70–99)
Potassium: 3.7 mmol/L (ref 3.5–5.1)
Sodium: 140 mmol/L (ref 135–145)
Total Bilirubin: 0.8 mg/dL (ref 0.3–1.2)
Total Protein: 5.4 g/dL — ABNORMAL LOW (ref 6.5–8.1)

## 2020-11-21 LAB — CBC
HCT: 29.2 % — ABNORMAL LOW (ref 39.0–52.0)
HCT: 32 % — ABNORMAL LOW (ref 39.0–52.0)
Hemoglobin: 9.9 g/dL — ABNORMAL LOW (ref 13.0–17.0)
Hemoglobin: 11.5 g/dL — ABNORMAL LOW (ref 13.0–17.0)
MCH: 31.7 pg (ref 26.0–34.0)
MCH: 33 pg (ref 26.0–34.0)
MCHC: 33.9 g/dL (ref 30.0–36.0)
MCHC: 35.9 g/dL (ref 30.0–36.0)
MCV: 93.6 fL (ref 80.0–100.0)
MCV: 92 fL (ref 80.0–100.0)
Platelets: 64 10*3/uL — ABNORMAL LOW (ref 150–400)
Platelets: 105 10*3/uL — ABNORMAL LOW (ref 150–400)
RBC: 3.12 MIL/uL — ABNORMAL LOW (ref 4.22–5.81)
RBC: 3.48 MIL/uL — ABNORMAL LOW (ref 4.22–5.81)
RDW: 13.3 % (ref 11.5–15.5)
RDW: 13.8 % (ref 11.5–15.5)
WBC: 6.8 10*3/uL (ref 4.0–10.5)
WBC: 12 10*3/uL — ABNORMAL HIGH (ref 4.0–10.5)
nRBC: 0 % (ref 0.0–0.2)
nRBC: 0 % (ref 0.0–0.2)

## 2020-11-21 LAB — PLATELET COUNT: Platelets: 89 10*3/uL — ABNORMAL LOW (ref 150–400)

## 2020-11-21 LAB — HEPARIN LEVEL (UNFRACTIONATED): Heparin Unfractionated: 0.68 IU/mL (ref 0.30–0.70)

## 2020-11-21 LAB — PROTIME-INR
INR: 1.6 — ABNORMAL HIGH (ref 0.8–1.2)
Prothrombin Time: 18.3 seconds — ABNORMAL HIGH (ref 11.4–15.2)

## 2020-11-21 LAB — HEMOGLOBIN AND HEMATOCRIT, BLOOD
HCT: 27.8 % — ABNORMAL LOW (ref 39.0–52.0)
Hemoglobin: 10.1 g/dL — ABNORMAL LOW (ref 13.0–17.0)

## 2020-11-21 LAB — APTT: aPTT: 36 seconds (ref 24–36)

## 2020-11-21 LAB — MAGNESIUM: Magnesium: 3 mg/dL — ABNORMAL HIGH (ref 1.7–2.4)

## 2020-11-21 SURGERY — CORONARY ARTERY BYPASS GRAFTING (CABG)
Anesthesia: General | Site: Chest

## 2020-11-21 MED ORDER — SODIUM CHLORIDE 0.9% FLUSH: 10.0000 mL | INTRAVENOUS | Status: DC | PRN

## 2020-11-21 MED ORDER — LACTATED RINGERS IV SOLN: 500.0000 mL | Freq: Once | INTRAVENOUS | Status: DC | PRN

## 2020-11-21 MED ORDER — SODIUM CHLORIDE 0.9% FLUSH
10.0000 mL | Freq: Two times a day (BID) | INTRAVENOUS | Status: DC
  Administered 2020-11-21: 30 mL
  Administered 2020-11-22 – 2020-11-23 (×3): 10 mL

## 2020-11-21 MED ORDER — ASPIRIN 81 MG PO CHEW: 324.0000 mg | CHEWABLE_TABLET | Freq: Every day | ORAL | Status: DC

## 2020-11-21 MED ORDER — ACETAMINOPHEN 500 MG PO TABS
1000.0000 mg | ORAL_TABLET | Freq: Four times a day (QID) | ORAL | Status: DC
  Administered 2020-11-21 – 2020-11-24 (×7): 1000 mg via ORAL
  Filled 2020-11-21 (×10): qty 2

## 2020-11-21 MED ORDER — INSULIN REGULAR(HUMAN) IN NACL 100-0.9 UT/100ML-% IV SOLN
INTRAVENOUS | Status: DC
  Administered 2020-11-21: 0.5 [IU]/h via INTRAVENOUS

## 2020-11-21 MED ORDER — LACTATED RINGERS IV SOLN: INTRAVENOUS | Status: DC | PRN

## 2020-11-21 MED ORDER — CHLORHEXIDINE GLUCONATE 0.12 % MT SOLN
15.0000 mL | OROMUCOSAL | Status: AC
  Administered 2020-11-21: 15 mL via OROMUCOSAL

## 2020-11-21 MED ORDER — VANCOMYCIN HCL IN DEXTROSE 1-5 GM/200ML-% IV SOLN
1000.0000 mg | Freq: Once | INTRAVENOUS | Status: AC
  Administered 2020-11-21: 1000 mg via INTRAVENOUS
  Filled 2020-11-21: qty 200

## 2020-11-21 MED ORDER — ALBUMIN HUMAN 5 % IV SOLN: INTRAVENOUS | Status: DC | PRN

## 2020-11-21 MED ORDER — SUCCINYLCHOLINE CHLORIDE 200 MG/10ML IV SOSY
PREFILLED_SYRINGE | INTRAVENOUS | Status: AC
  Filled 2020-11-21: qty 10

## 2020-11-21 MED ORDER — DEXTROSE 50 % IV SOLN: 0.0000 mL | INTRAVENOUS | Status: DC | PRN

## 2020-11-21 MED ORDER — MIDAZOLAM HCL (PF) 10 MG/2ML IJ SOLN
INTRAMUSCULAR | Status: AC
  Filled 2020-11-21: qty 2

## 2020-11-21 MED ORDER — PROTAMINE SULFATE 10 MG/ML IV SOLN
INTRAVENOUS | Status: AC
  Filled 2020-11-21: qty 25

## 2020-11-21 MED ORDER — ALBUMIN HUMAN 5 % IV SOLN
250.0000 mL | INTRAVENOUS | Status: AC | PRN
  Administered 2020-11-21 (×3): 12.5 g via INTRAVENOUS
  Filled 2020-11-21: qty 250

## 2020-11-21 MED ORDER — POTASSIUM CHLORIDE 10 MEQ/50ML IV SOLN
10.0000 meq | INTRAVENOUS | Status: AC
  Administered 2020-11-21 (×3): 10 meq via INTRAVENOUS

## 2020-11-21 MED ORDER — ROCURONIUM BROMIDE 10 MG/ML (PF) SYRINGE
PREFILLED_SYRINGE | INTRAVENOUS | Status: DC | PRN
  Administered 2020-11-21: 30 mg via INTRAVENOUS
  Administered 2020-11-21: 70 mg via INTRAVENOUS
  Administered 2020-11-21 (×2): 30 mg via INTRAVENOUS

## 2020-11-21 MED ORDER — EPHEDRINE SULFATE-NACL 50-0.9 MG/10ML-% IV SOSY
PREFILLED_SYRINGE | INTRAVENOUS | Status: DC | PRN
  Administered 2020-11-21: 5 mg via INTRAVENOUS
  Administered 2020-11-21: 10 mg via INTRAVENOUS

## 2020-11-21 MED ORDER — FAMOTIDINE IN NACL 20-0.9 MG/50ML-% IV SOLN
20.0000 mg | Freq: Two times a day (BID) | INTRAVENOUS | Status: AC
  Administered 2020-11-21: 20 mg via INTRAVENOUS
  Filled 2020-11-21 (×2): qty 50

## 2020-11-21 MED ORDER — SODIUM CHLORIDE 0.9 % IV SOLN
1.5000 g | Freq: Two times a day (BID) | INTRAVENOUS | Status: AC
  Administered 2020-11-21 – 2020-11-23 (×4): 1.5 g via INTRAVENOUS
  Filled 2020-11-21 (×4): qty 1.5

## 2020-11-21 MED ORDER — LACTATED RINGERS IV SOLN: INTRAVENOUS | Status: DC

## 2020-11-21 MED ORDER — ACETAMINOPHEN 160 MG/5ML PO SOLN: 1000.0000 mg | Freq: Four times a day (QID) | ORAL | Status: DC

## 2020-11-21 MED ORDER — NOREPINEPHRINE 4 MG/250ML-% IV SOLN: 0.0000 ug/min | INTRAVENOUS | Status: DC

## 2020-11-21 MED ORDER — MAGNESIUM SULFATE 4 GM/100ML IV SOLN
4.0000 g | Freq: Once | INTRAVENOUS | Status: AC
  Administered 2020-11-21: 4 g via INTRAVENOUS
  Filled 2020-11-21: qty 100

## 2020-11-21 MED ORDER — PROPOFOL 10 MG/ML IV BOLUS
INTRAVENOUS | Status: DC | PRN
  Administered 2020-11-21: 20 mg via INTRAVENOUS

## 2020-11-21 MED ORDER — SODIUM CHLORIDE (PF) 0.9 % IJ SOLN
OROMUCOSAL | Status: DC | PRN
  Administered 2020-11-21 (×3): 4 mL via TOPICAL

## 2020-11-21 MED ORDER — PHENYLEPHRINE HCL-NACL 20-0.9 MG/250ML-% IV SOLN
0.0000 ug/min | INTRAVENOUS | Status: DC
  Administered 2020-11-21: 60 ug/min via INTRAVENOUS
  Administered 2020-11-22: 40 ug/min via INTRAVENOUS
  Administered 2020-11-22: 30 ug/min via INTRAVENOUS
  Filled 2020-11-21 (×4): qty 250

## 2020-11-21 MED ORDER — METOPROLOL TARTRATE 25 MG/10 ML ORAL SUSPENSION
12.5000 mg | Freq: Two times a day (BID) | ORAL | Status: DC
  Filled 2020-11-21 (×5): qty 5

## 2020-11-21 MED ORDER — 0.9 % SODIUM CHLORIDE (POUR BTL) OPTIME
TOPICAL | Status: DC | PRN
  Administered 2020-11-21: 5000 mL

## 2020-11-21 MED ORDER — ORAL CARE MOUTH RINSE
15.0000 mL | Freq: Two times a day (BID) | OROMUCOSAL | Status: DC
  Administered 2020-11-22 – 2020-11-24 (×4): 15 mL via OROMUCOSAL

## 2020-11-21 MED ORDER — EPHEDRINE 5 MG/ML INJ
INTRAVENOUS | Status: AC
  Filled 2020-11-21: qty 10

## 2020-11-21 MED ORDER — SODIUM CHLORIDE 0.9 % IV SOLN: 250.0000 mL | INTRAVENOUS | Status: DC

## 2020-11-21 MED ORDER — CHLORHEXIDINE GLUCONATE 0.12% ORAL RINSE (MEDLINE KIT): 15.0000 mL | Freq: Two times a day (BID) | OROMUCOSAL | Status: DC

## 2020-11-21 MED ORDER — PHENYLEPHRINE 40 MCG/ML (10ML) SYRINGE FOR IV PUSH (FOR BLOOD PRESSURE SUPPORT)
PREFILLED_SYRINGE | INTRAVENOUS | Status: AC
  Filled 2020-11-21: qty 10

## 2020-11-21 MED ORDER — DEXMEDETOMIDINE HCL IN NACL 400 MCG/100ML IV SOLN: 0.0000 ug/kg/h | INTRAVENOUS | Status: DC

## 2020-11-21 MED ORDER — CHLORHEXIDINE GLUCONATE 0.12 % MT SOLN
15.0000 mL | Freq: Two times a day (BID) | OROMUCOSAL | Status: DC
  Administered 2020-11-21 – 2020-11-25 (×6): 15 mL via OROMUCOSAL
  Filled 2020-11-21 (×6): qty 15

## 2020-11-21 MED ORDER — ACETAMINOPHEN 160 MG/5ML PO SOLN: 650.0000 mg | Freq: Once | ORAL | Status: AC

## 2020-11-21 MED ORDER — ONDANSETRON HCL 4 MG/2ML IJ SOLN
4.0000 mg | Freq: Four times a day (QID) | INTRAMUSCULAR | Status: DC | PRN
  Administered 2020-11-21 – 2020-11-22 (×2): 4 mg via INTRAVENOUS
  Filled 2020-11-21 (×2): qty 2

## 2020-11-21 MED ORDER — MIDAZOLAM HCL 2 MG/2ML IJ SOLN: 2.0000 mg | INTRAMUSCULAR | Status: DC | PRN

## 2020-11-21 MED ORDER — SODIUM CHLORIDE 0.9 % IV SOLN: INTRAVENOUS | Status: DC

## 2020-11-21 MED ORDER — CHLORHEXIDINE GLUCONATE CLOTH 2 % EX PADS
6.0000 | MEDICATED_PAD | Freq: Every day | CUTANEOUS | Status: DC
  Administered 2020-11-21 – 2020-11-23 (×3): 6 via TOPICAL

## 2020-11-21 MED ORDER — SODIUM CHLORIDE 0.9% FLUSH: 3.0000 mL | INTRAVENOUS | Status: DC | PRN

## 2020-11-21 MED ORDER — HEPARIN SODIUM (PORCINE) 1000 UNIT/ML IJ SOLN
INTRAMUSCULAR | Status: DC | PRN
  Administered 2020-11-21: 25000 [IU] via INTRAVENOUS

## 2020-11-21 MED ORDER — BISACODYL 5 MG PO TBEC
10.0000 mg | DELAYED_RELEASE_TABLET | Freq: Every day | ORAL | Status: DC
  Administered 2020-11-22 – 2020-11-23 (×2): 10 mg via ORAL
  Filled 2020-11-21 (×4): qty 2

## 2020-11-21 MED ORDER — ASPIRIN EC 325 MG PO TBEC
325.0000 mg | DELAYED_RELEASE_TABLET | Freq: Every day | ORAL | Status: DC
  Administered 2020-11-22 – 2020-11-25 (×4): 325 mg via ORAL
  Filled 2020-11-21 (×4): qty 1

## 2020-11-21 MED ORDER — SODIUM CHLORIDE 0.9 % IV SOLN: INTRAVENOUS | Status: DC | PRN

## 2020-11-21 MED ORDER — PROTAMINE SULFATE 10 MG/ML IV SOLN
INTRAVENOUS | Status: DC | PRN
  Administered 2020-11-21: 250 mg via INTRAVENOUS

## 2020-11-21 MED ORDER — PROPOFOL 10 MG/ML IV BOLUS
INTRAVENOUS | Status: AC
  Filled 2020-11-21: qty 20

## 2020-11-21 MED ORDER — BISACODYL 10 MG RE SUPP: 10.0000 mg | Freq: Every day | RECTAL | Status: DC

## 2020-11-21 MED ORDER — DOCUSATE SODIUM 100 MG PO CAPS
200.0000 mg | ORAL_CAPSULE | Freq: Every day | ORAL | Status: DC
  Administered 2020-11-22 – 2020-11-23 (×2): 200 mg via ORAL
  Filled 2020-11-21 (×5): qty 2

## 2020-11-21 MED ORDER — METOPROLOL TARTRATE 5 MG/5ML IV SOLN: 2.5000 mg | INTRAVENOUS | Status: DC | PRN

## 2020-11-21 MED ORDER — NITROGLYCERIN IN D5W 200-5 MCG/ML-% IV SOLN: 0.0000 ug/min | INTRAVENOUS | Status: DC

## 2020-11-21 MED ORDER — PLASMA-LYTE 148 IV SOLN
INTRAVENOUS | Status: DC | PRN
  Administered 2020-11-21: 500 mL via INTRAVASCULAR

## 2020-11-21 MED ORDER — MIDAZOLAM HCL 5 MG/5ML IJ SOLN
INTRAMUSCULAR | Status: DC | PRN
  Administered 2020-11-21: 1 mg via INTRAVENOUS
  Administered 2020-11-21: 2 mg via INTRAVENOUS
  Administered 2020-11-21: 1 mg via INTRAVENOUS

## 2020-11-21 MED ORDER — SODIUM CHLORIDE 0.9% FLUSH
3.0000 mL | Freq: Two times a day (BID) | INTRAVENOUS | Status: DC
  Administered 2020-11-22 – 2020-11-23 (×3): 3 mL via INTRAVENOUS

## 2020-11-21 MED ORDER — ROCURONIUM BROMIDE 10 MG/ML (PF) SYRINGE
PREFILLED_SYRINGE | INTRAVENOUS | Status: AC
  Filled 2020-11-21: qty 10

## 2020-11-21 MED ORDER — METOPROLOL TARTRATE 12.5 MG HALF TABLET
12.5000 mg | ORAL_TABLET | Freq: Two times a day (BID) | ORAL | Status: DC
  Administered 2020-11-23 – 2020-11-25 (×5): 12.5 mg via ORAL
  Filled 2020-11-21 (×5): qty 1

## 2020-11-21 MED ORDER — MORPHINE SULFATE (PF) 2 MG/ML IV SOLN
1.0000 mg | INTRAVENOUS | Status: DC | PRN
  Administered 2020-11-22 (×2): 2 mg via INTRAVENOUS
  Filled 2020-11-21 (×2): qty 1

## 2020-11-21 MED ORDER — FENTANYL CITRATE (PF) 250 MCG/5ML IJ SOLN
INTRAMUSCULAR | Status: DC | PRN
  Administered 2020-11-21: 25 ug via INTRAVENOUS
  Administered 2020-11-21: 250 ug via INTRAVENOUS
  Administered 2020-11-21: 100 ug via INTRAVENOUS
  Administered 2020-11-21: 150 ug via INTRAVENOUS
  Administered 2020-11-21: 500 ug via INTRAVENOUS
  Administered 2020-11-21: 50 ug via INTRAVENOUS
  Administered 2020-11-21: 175 ug via INTRAVENOUS

## 2020-11-21 MED ORDER — SODIUM BICARBONATE 8.4 % IV SOLN
50.0000 meq | Freq: Once | INTRAVENOUS | Status: AC
  Administered 2020-11-21: 50 meq via INTRAVENOUS

## 2020-11-21 MED ORDER — FENTANYL CITRATE (PF) 250 MCG/5ML IJ SOLN
INTRAMUSCULAR | Status: AC
  Filled 2020-11-21: qty 25

## 2020-11-21 MED ORDER — ACETAMINOPHEN 650 MG RE SUPP
650.0000 mg | Freq: Once | RECTAL | Status: AC
  Administered 2020-11-21: 650 mg via RECTAL

## 2020-11-21 MED ORDER — SODIUM CHLORIDE 0.45 % IV SOLN: INTRAVENOUS | Status: DC | PRN

## 2020-11-21 MED ORDER — HEPARIN SODIUM (PORCINE) 1000 UNIT/ML IJ SOLN
INTRAMUSCULAR | Status: AC
  Filled 2020-11-21: qty 1

## 2020-11-21 MED ORDER — NICARDIPINE HCL IN NACL 20-0.86 MG/200ML-% IV SOLN
3.0000 mg/h | INTRAVENOUS | Status: DC
  Filled 2020-11-21: qty 200

## 2020-11-21 MED ORDER — ARTIFICIAL TEARS OPHTHALMIC OINT
TOPICAL_OINTMENT | OPHTHALMIC | Status: AC
  Filled 2020-11-21: qty 3.5

## 2020-11-21 MED ORDER — OXYCODONE HCL 5 MG PO TABS
5.0000 mg | ORAL_TABLET | ORAL | Status: DC | PRN
  Administered 2020-11-21: 5 mg via ORAL
  Filled 2020-11-21: qty 1

## 2020-11-21 MED ORDER — ORAL CARE MOUTH RINSE
15.0000 mL | OROMUCOSAL | Status: DC
  Administered 2020-11-21 (×2): 15 mL via OROMUCOSAL

## 2020-11-21 MED ORDER — PANTOPRAZOLE SODIUM 40 MG PO TBEC
40.0000 mg | DELAYED_RELEASE_TABLET | Freq: Every day | ORAL | Status: DC
  Administered 2020-11-23 – 2020-11-25 (×3): 40 mg via ORAL
  Filled 2020-11-21 (×3): qty 1

## 2020-11-21 MED ORDER — TRAMADOL HCL 50 MG PO TABS: 50.0000 mg | ORAL_TABLET | ORAL | Status: DC | PRN

## 2020-11-21 SURGICAL SUPPLY — 88 items
BAG DECANTER FOR FLEXI CONT (MISCELLANEOUS) ×3 IMPLANT
BLADE CLIPPER SURG (BLADE) IMPLANT
BLADE STERNUM SYSTEM 6 (BLADE) ×3 IMPLANT
BNDG CMPR MED 10X6 ELC LF (GAUZE/BANDAGES/DRESSINGS) ×1
BNDG ELASTIC 4X5.8 VLCR STR LF (GAUZE/BANDAGES/DRESSINGS) ×3 IMPLANT
BNDG ELASTIC 6X10 VLCR STRL LF (GAUZE/BANDAGES/DRESSINGS) ×3 IMPLANT
BNDG ELASTIC 6X5.8 VLCR STR LF (GAUZE/BANDAGES/DRESSINGS) ×3 IMPLANT
BNDG GAUZE ELAST 4 BULKY (GAUZE/BANDAGES/DRESSINGS) ×3 IMPLANT
CABLE SURGICAL S-101-97-12 (CABLE) ×3 IMPLANT
CANISTER SUCT 3000ML PPV (MISCELLANEOUS) ×3 IMPLANT
CANNULA MC2 2 STG 29/37 NON-V (CANNULA) ×2 IMPLANT
CANNULA MC2 TWO STAGE (CANNULA) ×3
CANNULA NON VENT 20FR 12 (CANNULA) ×3 IMPLANT
CATH ROBINSON RED A/P 18FR (CATHETERS) ×6 IMPLANT
CLIP RETRACTION 3.0MM CORONARY (MISCELLANEOUS) ×3 IMPLANT
CLIP VESOCCLUDE MED 24/CT (CLIP) IMPLANT
CLIP VESOCCLUDE SM WIDE 24/CT (CLIP) ×9 IMPLANT
CONN 3/8X1/2 ST GISH (MISCELLANEOUS) ×3 IMPLANT
CONN ST 1/2X1/2  BEN (MISCELLANEOUS) ×3
CONN ST 1/2X1/2 BEN (MISCELLANEOUS) ×2 IMPLANT
CONNECTOR BLAKE 2:1 CARIO BLK (MISCELLANEOUS) ×3 IMPLANT
DERMABOND ADVANCED (GAUZE/BANDAGES/DRESSINGS) ×1
DERMABOND ADVANCED .7 DNX12 (GAUZE/BANDAGES/DRESSINGS) ×2 IMPLANT
DRAIN CHANNEL 19F RND (DRAIN) ×6 IMPLANT
DRAIN CONNECTOR BLAKE 1:1 (MISCELLANEOUS) ×3 IMPLANT
DRAPE CARDIOVASCULAR INCISE (DRAPES) ×3
DRAPE INCISE IOBAN 66X45 STRL (DRAPES) IMPLANT
DRAPE SLUSH/WARMER DISC (DRAPES) ×3 IMPLANT
DRAPE SRG 135X102X78XABS (DRAPES) ×2 IMPLANT
DRSG AQUACEL AG ADV 3.5X10 (GAUZE/BANDAGES/DRESSINGS) ×3 IMPLANT
DRSG AQUACEL AG ADV 3.5X14 (GAUZE/BANDAGES/DRESSINGS) ×3 IMPLANT
DRSG COVADERM 4X14 (GAUZE/BANDAGES/DRESSINGS) ×3 IMPLANT
ELECT BLADE 4.0 EZ CLEAN MEGAD (MISCELLANEOUS) ×3
ELECT REM PT RETURN 9FT ADLT (ELECTROSURGICAL) ×6
ELECTRODE BLDE 4.0 EZ CLN MEGD (MISCELLANEOUS) ×2 IMPLANT
ELECTRODE REM PT RTRN 9FT ADLT (ELECTROSURGICAL) ×4 IMPLANT
FELT TEFLON 1X6 (MISCELLANEOUS) ×3 IMPLANT
GAUZE SPONGE 4X4 12PLY STRL (GAUZE/BANDAGES/DRESSINGS) ×6 IMPLANT
GAUZE SPONGE 4X4 12PLY STRL LF (GAUZE/BANDAGES/DRESSINGS) ×6 IMPLANT
GLOVE BIO SURGEON STRL SZ 6 (GLOVE) ×6 IMPLANT
GLOVE BIO SURGEON STRL SZ7 (GLOVE) ×6 IMPLANT
GLOVE BIO SURGEON STRL SZ7.5 (GLOVE) ×6 IMPLANT
GLOVE BIOGEL M STRL SZ7.5 (GLOVE) ×6 IMPLANT
GLOVE SURG SS PI 7.5 STRL IVOR (GLOVE) ×3 IMPLANT
GLOVE SURG UNDER POLY LF SZ6 (GLOVE) ×9 IMPLANT
GOWN STRL REUS W/ TWL LRG LVL3 (GOWN DISPOSABLE) ×12 IMPLANT
GOWN STRL REUS W/ TWL XL LVL3 (GOWN DISPOSABLE) ×4 IMPLANT
GOWN STRL REUS W/TWL LRG LVL3 (GOWN DISPOSABLE) ×18
GOWN STRL REUS W/TWL XL LVL3 (GOWN DISPOSABLE) ×6
HEMOSTAT POWDER SURGIFOAM 1G (HEMOSTASIS) ×9 IMPLANT
INSERT SUTURE HOLDER (MISCELLANEOUS) ×3 IMPLANT
KIT BASIN OR (CUSTOM PROCEDURE TRAY) ×3 IMPLANT
KIT SUCTION CATH 14FR (SUCTIONS) ×3 IMPLANT
KIT TURNOVER KIT B (KITS) ×3 IMPLANT
KIT VASOVIEW HEMOPRO 2 VH 4000 (KITS) ×3 IMPLANT
LEAD PACING MYOCARDI (MISCELLANEOUS) ×3 IMPLANT
MARKER GRAFT CORONARY BYPASS (MISCELLANEOUS) ×9 IMPLANT
NS IRRIG 1000ML POUR BTL (IV SOLUTION) ×18 IMPLANT
PACK ACCESSORY CANNULA KIT (KITS) ×3 IMPLANT
PACK E OPEN HEART (SUTURE) ×3 IMPLANT
PACK OPEN HEART (CUSTOM PROCEDURE TRAY) ×3 IMPLANT
PAD ARMBOARD 7.5X6 YLW CONV (MISCELLANEOUS) ×6 IMPLANT
PAD ELECT DEFIB RADIOL ZOLL (MISCELLANEOUS) ×3 IMPLANT
PENCIL BUTTON HOLSTER BLD 10FT (ELECTRODE) ×3 IMPLANT
POSITIONER HEAD DONUT 9IN (MISCELLANEOUS) ×3 IMPLANT
PUNCH AORTIC ROTATE 4.0MM (MISCELLANEOUS) ×3 IMPLANT
SET CARDIOPLEGIA MPS 5001102 (MISCELLANEOUS) ×3 IMPLANT
SUPPORT HEART JANKE-BARRON (MISCELLANEOUS) ×3 IMPLANT
SUT BONE WAX W31G (SUTURE) ×3 IMPLANT
SUT ETHIBOND X763 2 0 SH 1 (SUTURE) ×6 IMPLANT
SUT MNCRL AB 3-0 PS2 18 (SUTURE) ×6 IMPLANT
SUT PDS AB 1 CTX 36 (SUTURE) ×6 IMPLANT
SUT PROLENE 4 0 RB 1 (SUTURE) ×3
SUT PROLENE 4 0 SH DA (SUTURE) ×9 IMPLANT
SUT PROLENE 4-0 RB1 .5 CRCL 36 (SUTURE) ×2 IMPLANT
SUT PROLENE 5 0 C 1 36 (SUTURE) ×15 IMPLANT
SUT PROLENE 7 0 BV 1 (SUTURE) ×9 IMPLANT
SUT PROLENE 7 0 BV1 MDA (SUTURE) ×3 IMPLANT
SUT STEEL 6MS V (SUTURE) ×6 IMPLANT
SYSTEM SAHARA CHEST DRAIN ATS (WOUND CARE) ×3 IMPLANT
TAPE CLOTH SURG 4X10 WHT LF (GAUZE/BANDAGES/DRESSINGS) ×3 IMPLANT
TAPE PAPER 2X10 WHT MICROPORE (GAUZE/BANDAGES/DRESSINGS) ×3 IMPLANT
TOWEL GREEN STERILE (TOWEL DISPOSABLE) ×3 IMPLANT
TOWEL GREEN STERILE FF (TOWEL DISPOSABLE) ×3 IMPLANT
TRAY FOLEY SLVR 16FR TEMP STAT (SET/KITS/TRAYS/PACK) ×3 IMPLANT
TUBING LAP HI FLOW INSUFFLATIO (TUBING) ×3 IMPLANT
UNDERPAD 30X36 HEAVY ABSORB (UNDERPADS AND DIAPERS) ×3 IMPLANT
WATER STERILE IRR 1000ML POUR (IV SOLUTION) ×6 IMPLANT

## 2020-11-21 NOTE — Progress Notes (Signed)
     [address]       [place] [address]             [phone]       No events over the weekend Vitals:   11/21/20 0704 11/21/20 0705  BP:    Pulse: (!) 51 (!) 57  Resp: 16 16  Temp:    SpO2: 97% 98%   Arousable NAD Sinus brady EWOB  OR today for CABG 3-4  Kaipo Ardis O Laycee Fitzsimmons

## 2020-11-21 NOTE — Discharge Instructions (Signed)

## 2020-11-21 NOTE — Anesthesia Procedure Notes (Signed)
Procedure Name: Intubation Date/Time: 11/21/2020 8:11 AM Performed by: Lowella Dell, CRNA Pre-anesthesia Checklist: Patient identified, Emergency Drugs available, Suction available and Patient being monitored Patient Re-evaluated:Patient Re-evaluated prior to induction Oxygen Delivery Method: Circle System Utilized Preoxygenation: Pre-oxygenation with 100% oxygen Induction Type: IV induction Ventilation: Mask ventilation without difficulty Laryngoscope Size: Mac and 3 Grade View: Grade I Tube type: Oral Tube size: 8.0 mm Number of attempts: 1 Airway Equipment and Method: Stylet Placement Confirmation: ETT inserted through vocal cords under direct vision,  positive ETCO2 and breath sounds checked- equal and bilateral Secured at: 23 cm Tube secured with: Tape Dental Injury: Teeth and Oropharynx as per pre-operative assessment

## 2020-11-21 NOTE — Discharge Summary (Signed)
Physician Discharge Summary  Patient ID: Eddie Fox MRN: [id number] DOB/AGE: 78-Feb-1942 78 y.o.  Admit date: 11/18/2020 Discharge date: 11/25/2020  Admission Diagnoses:  Abnormal nuclear stress test Coronary artery disease of native artery of native heart with stable angina pectoris (HCC) Dyslipidemia Hypertension History of atrial bigeminy  Discharge Diagnoses:   Abnormal nuclear stress test Coronary artery disease of native artery of native heart with stable angina pectoris (HCC) S/P CABG x 4 Dyslipidemia Hypertension History of atrial bigeminy  Discharged Condition: good   History of Present Illness:     This is a 79 year old male with a past medical history of hyperlipidemia, NASH, and essential hypertension whose initial symptoms were substernal chest pressure and left sided "funny feeling" in his chest while walking. These symptoms stopped after about 30 minutes of resting. This past Sunday, he felt even worse with this complaint. He was seen at his PCP's office (Dr. Alba Fox as his primary physician, Dr. Lorelei Fox, was not available) and EKG showed atrial bigeminy. He was then referred to Eddie Fox. A nuclear stress test done 10/19/2020 showed inferolateral infarct but no ischemia. An echo also done 10/19/2020 showed LVEF 55-60%, LA severely dilated, RA moderately dilated, the left ventricle has no regional wall motion abnormalities, and mild AI. He presented to [facility] today for a cardiac catheterization. Results showed severe multivessel coronary artery disease (mid LAD with 80% stenosis, distal LAD with 85% stenosis, ostial Circumflex to proximal Circumflex with 95% stenosis, and a 90% stenosis of the distal RCA). Eddie Fox has been consulted for the consideration of coronary artery bypass grafting surgery. At the time of my exam, he denies chest pressure/pain or shortness of breath.  Hospital Course:  [name] remained stable following the left heart  catheterization. He decided to proceed with surgery. He was taken to the OR on 11/21/20 where CABG x4 was carried out without complication. Following the procedure, he was taken to the ICU in stable condition.  The mechanical ventilator was weaned per protocol and he was extubated at 5:20pm.  The patient's post extubation ABG showed he was acidodic.  He was treated with bicarb for this.  The patient's Arterial line was removed on POD #1.  His chest tube output decreased.  They were removed on 11/23/2020.  He was weaned off Neo-synephrine as hemodynamics allowed.  The patient was volume overloaded and started on lasix for this.  He was maintaining NSR and started on low dose BB.  He was felt stable for transfer to the progressive care unit on 11/23/2020.     He maintained sinus rhythm. He is ambulating on room air with good oxygenation. His sternal and right lower extremity wounds are clean, dry, and healing without signs of infection. He has been tolerating a diet and had a bowel movement. He did have ecchymosis on his left flank, which was non tender. Unsure of etiology but H and H remained stable.  He has had some cough which has improved over time.  Chest x-ray appearance has also improved with some minor atelectasis/effusion in the left base.  Oxygen was weaned and he maintains good saturations on room air.  He is maintaining sinus rhythm.  He does not appear to be volume overloaded.  At time of discharge the patient is felt to be quite stable.  Significant Diagnostic Studies:   LEFT HEART CATH AND CORONARY ANGIOGRAPHY  Conclusion    Dist RCA lesion is 90% stenosed.  RPAV lesion is 60% stenosed.  Mid  RCA lesion is 30% stenosed.  Ost Cx to Prox Cx lesion is 95% stenosed.  Prox LAD lesion is 50% stenosed.  1st Diag lesion is 50% stenosed.  Mid LAD-1 lesion is 70% stenosed.  Mid LAD-2 lesion is 80% stenosed.  Dist LAD-1 lesion is 85% stenosed.  Dist LAD-2 lesion is 85% stenosed.  The  left ventricular systolic function is normal.  LV end diastolic pressure is low.   Severe multivessel CAD with evidence for coronary calcification involving the left coronary system.   There is significant segmental LAD stenoses of 50% proximally also involving the first diagonal vessel followed by 70% stenosis in the proximal to mid segment, and segmental 80 to 85% mid stenosis.  Left circumflex stenosis 95% ostial stenosis and reduced antegrade flow secondary to competitive filling via RCA collateralization.  Dominant RCA with significant vessel tortuosity with 30% mid stenosis, focal 90% stenosis immediately proximal to the takeoff of the PDA vessel followed by 60% stenosis of the continuation branch PLA with collateralization to the distal circumflex from the distal RCA branches.  Preserved LV function without focal segmental wall motion abnormalities.  LVEDP 6 mm.  Treatments:      [place] ,[place] [address] [phone]    OPERATIVE NOTE            11/21/2020  Patient:  Eddie Fox Pre-Op Dx: 3V CAD                         HTN                         Hyperlipidemia   Post-op Dx:  same Procedure: CABG X 4.  LIMA LAD, RSVG PDA, OM2, D1   Endoscopic greater saphenous vein harvest on the right Intra-operative Transesophageal Echocardiogram  Surgeon and Role:      * Lightfoot, Eddie Crater, MD - Primary    * Macarthur Critchley, PA-C - assisting    Discharge Exam: Blood pressure (!) 143/60, pulse 77, temperature 99.6 F (37.6 C), temperature source Tympanic, resp. rate 16, height 5\' 10"  (1.778 m), weight 72.3 kg, SpO2 93 %.   General appearance: alert, cooperative and no distress Heart: regular rate and rhythm Lungs: mildly dim in left base Abdomen: benign Extremities: no edema Wound: incis healing well Discharge Instructions    Discharge patient   Complete by: As directed     Discharge disposition: 01-Home or Self Care   Discharge patient date: 11/25/2020     Allergies as of 11/25/2020      Reactions   Hydrocodone    pts states made him sick.   Pneumococcal Vaccine    REACTION: significant arm swelling, redness      Medication List    STOP taking these medications   amLODipine 10 MG tablet Commonly known as: NORVASC     TAKE these medications   acetaminophen 500 MG tablet Commonly known as: TYLENOL Take 1,000 mg by mouth every 6 (six) hours as needed for mild pain or headache.   aspirin 325 MG EC tablet Take 1 tablet (325 mg total) by mouth daily. Start taking on: November 26, 2020 What changed:   medication strength  how much to take  additional instructions   atorvastatin 80 MG tablet Commonly known as: LIPITOR Take 1 tablet (80 mg total) by mouth every evening.   fluticasone 50 MCG/ACT nasal spray Commonly known as: FLONASE USE 2 SPRAY(S) IN  EACH NOSTRIL ONCE DAILY AS NEEDED What changed:   how much to take  how to take this  when to take this  reasons to take this  additional instructions   lisinopril 5 MG tablet Commonly known as: ZESTRIL Take 1 tablet (5 mg total) by mouth daily.   metoprolol tartrate 25 MG tablet Commonly known as: LOPRESSOR Take 0.5 tablets (12.5 mg total) by mouth 2 (two) times daily.   nitroGLYCERIN 0.4 MG SL tablet Commonly known as: NITROSTAT Place 1 tablet (0.4 mg total) under the tongue every 5 (five) minutes as needed for chest pain.   traMADol 50 MG tablet Commonly known as: ULTRAM Take 1 tablet (50 mg total) by mouth every 6 (six) hours as needed for up to 7 days for moderate pain.       Follow-up Information    Lajuana Matte, MD. Go on 12/02/2020.   Specialty: Cardiothoracic Surgery Why: YOUR APPOINTMENT IS AT 10:45.   Contact information: [place] [address] [phone]        Almyra Deforest, [place]. Go on 12/07/2020.   Specialties:  Cardiology, Radiology Why: Your appointment is at 9:15am.  Contact information: [address] [place] [address] [phone]              The patient has been discharged on:   1.Beta Blocker:  Yes [ X  ]                              No   [   ]                              If No, reason:  2.Ace Inhibitor/ARB: Yes [ y  ]                                     No  [    ]                                     If No, reason: 3.Statin:   Yes Valu.Nieves ]                  No  [   ]                  If No, reason:  4.Ecasa:  Yes  [ X  ]                  No   [   ]                  If No, reason:     Signed: John Giovanni , PA-C 11/25/2020, 8:31 AM

## 2020-11-21 NOTE — Anesthesia Procedure Notes (Signed)
Arterial Line Insertion Start/End12/05/2020 6:45 AM, 11/21/2020 6:50 AM Performed by: Lowella Dell, CRNA, CRNA  Patient location: Pre-op. Preanesthetic checklist: patient identified, IV checked, site marked, risks and benefits discussed, surgical consent, monitors and equipment checked, pre-op evaluation, timeout performed and anesthesia consent Lidocaine 1% used for infiltration Left, radial was placed Catheter size: 20 G Hand hygiene performed  and maximum sterile barriers used   Attempts: 1 Procedure performed without using ultrasound guided technique. Following insertion, dressing applied and Biopatch. Post procedure assessment: normal  Patient tolerated the procedure well with no immediate complications.

## 2020-11-21 NOTE — Op Note (Signed)
[address]       [place] [address]             [phone]                                          11/21/2020 Patient:  Eddie Fox Pre-Op Dx: 3V CAD   HTN   Hyperlipidemia   Post-op Dx:  same Procedure: CABG X 4.  LIMA LAD, RSVG PDA, OM2, D1   Endoscopic greater saphenous vein harvest on the right Intra-operative Transesophageal Echocardiogram  Surgeon and Role:      * Sher Hellinger, Lucile Crater, MD - Primary    * M. Roddenberry, PA-C - assisting  Anesthesia  general EBL:  510ml Blood Administration: 1 unit pRBCs Xclamp Time:  65 min Pump Time:  158min  Drains: 35 F blake drain: L, mediastinal  Wires: Ventricular wires Counts: correct   Indications: This is a 79 year old male who underwent an elective left heart cath after being worked up for positive stress test. 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 His ejection fraction is preserved and he has no significant valvular disease. On review of his left heart cath he has good targets in his LAD and PDA. His circumflex distribution is small and fills from right to left collaterals distally. He likely will require CABG x4 including the diagonal. He is tentatively scheduled for November 21, 2020. Risks and benefits have been discussed and he is agreeable to proceed.  Findings: Good LIMA, and vein graft.  Intramyocardial LAD, heavily calcified proximally.  Good PDA, OM, and diagonal vessel.  Good function on post-CPB TEE.  Operative Technique: All invasive lines were placed in pre-op holding.  After the risks, benefits and alternatives were thoroughly discussed, the patient was brought to the operative theatre.  Anesthesia was induced, and the patient was prepped and draped in normal sterile fashion.  An appropriate surgical pause was performed, and pre-operative antibiotics were dosed accordingly.  We began with simultaneous incisions along the right leg for harvesting of the greater  saphenous vein and the chest for the sternotomy.  In regards to the sternotomy, this was carried down with bovie cautery, and the sternum was divided with a reciprocating saw.  Meticulous hemostasis was obtained.  The left internal thoracic artery was exposed and harvested in in pedicled fashion.  The patient was systemically heparinized, and the artery was divided distally, and placed in a papaverine sponge.    The sternal elevator was removed, and a retractor was placed.  The pericardium was divided in the midline and fashioned into a cradle with pericardial stitches.   After we confirmed an appropriate ACT, the ascending aorta was cannulated in standard fashion.  The right atrial appendage was used for venous cannulation site.  Cardiopulmonary bypass was initiated, and the heart retractor was placed. The cross clamp was applied, and a dose of anterograde cardioplegia was given with good arrest of the heart.  We moved to the posterior wall of the heart, and found a good target on the PDA.  An arteriotomy was made, and the vein graft was anastomosed to it in an end to side fashion.  Next we exposed the lateral wall, and found a good target on the OM2.  An end to side anastomosis with the vein graft was then created.  Next, we exposed the  anterior wall of the heart and identified a good target on D1.   An arteriotomy was created.  The vein was anastomosed in an end to side fashion.  Finally, we exposed a good target on the LAD, and fashioned an end to side anastomosis between it and the LITA.  We began to re-warm, and a re-animation dose of cardioplegia was given.  The heart was de-aired, and the cross clamp was removed.  Meticulous hemostasis was obtained.    A partial occludding clamp was then placed on the ascending aorta, and we created an end to side anastomosis between it and the proximal vein grafts.  The proximal sites were marked with rings.  Hemostasis was obtained, and we separated from  cardiopulmonary bypass without event.the heparin was reversed with protamine.  Chest tubes and wires were placed, and the sternum was re-approximated with with sternal wires.  The soft tissue and skin were re-approximated wth absorbable suture.    The patient tolerated the procedure without any immediate complications, and was transferred to the ICU in guarded condition.  Yasmine Kilbourne Bary Leriche

## 2020-11-21 NOTE — Brief Op Note (Signed)
11/18/2020 - 11/21/2020  11:45 AM  PATIENT:  [name]  79 y.o. male  PRE-OPERATIVE DIAGNOSIS:  Coronary Artery Disease  POST-OPERATIVE DIAGNOSIS:  Coronary Artery Disease  PROCEDURE:  Procedure(s): CORONARY ARTERY BYPASS GRAFTING (CABG) X 4, USING LEFT INTERNAL MAMMARY ARTERY AND RIGHT LEG GREATER SAPHENOUS VEIN HARVESTED ENDOSCOPICALLY (N/A) TRANSESOPHAGEAL ECHOCARDIOGRAM (TEE) (N/A)   LIMA-LAD SVG-D1 SVG-OM SVG-PDA  Vein harvest time: 35 min  Vein prep time:  84min  SURGEON:  Lightfoot, Lucile Crater, MD - Primary  PHYSICIAN ASSISTANT: Aliyha Fornes   ASSISTANTS: Staff RNFA  ANESTHESIA:   general  EBL:  Per perfusion and anesthesia records   BLOOD ADMINISTERED:none  DRAINS: Left pleural and mediastinal tubes   LOCAL MEDICATIONS USED:  NONE  SPECIMEN:  No Specimen  DISPOSITION OF SPECIMEN:  N/A  COUNTS:  YES  DICTATION: .Dragon Dictation  PLAN OF CARE: Admit to inpatient   PATIENT DISPOSITION:  ICU - intubated and hemodynamically stable.   Delay start of Pharmacological VTE agent (>24hrs) due to surgical blood loss or risk of bleeding: yes

## 2020-11-21 NOTE — Anesthesia Postprocedure Evaluation (Signed)
Anesthesia Post Note  Patient: Eddie Fox  Procedure(s) Performed: CORONARY ARTERY BYPASS GRAFTING (CABG) X 4, USING LEFT INTERNAL MAMMARY ARTERY AND RIGHT LEG GREATER SAPHENOUS VEIN HARVESTED ENDOSCOPICALLY (N/A Chest) TRANSESOPHAGEAL ECHOCARDIOGRAM (TEE) (N/A )     Patient location during evaluation: SICU Anesthesia Type: General Level of consciousness: sedated, patient remains intubated per anesthesia plan and responds to stimulation Pain management: pain level controlled Vital Signs Assessment: post-procedure vital signs reviewed and stable Respiratory status: spontaneous breathing, nonlabored ventilation, respiratory function stable, patient on ventilator - see flowsheet for VS and patient remains intubated per anesthesia plan (beginning to wean from vent) Cardiovascular status: stable (requiring NTG BP control) Postop Assessment: no apparent nausea or vomiting Anesthetic complications: no   No complications documented.  Last Vitals:  Vitals:   11/21/20 1429 11/21/20 1500  BP:    Pulse: (!) 54 (!) 52  Resp: 12 12  Temp: (!) 36.1 C (!) 36.2 C  SpO2: 95% 95%    Last Pain:  Vitals:   11/21/20 0515  TempSrc: Oral  PainSc:                  Raybon Conard,E. Ashaad Gaertner

## 2020-11-21 NOTE — Progress Notes (Signed)
  Echocardiogram Echocardiogram Transesophageal has been performed.  Eddie Fox 11/21/2020, 8:52 AM

## 2020-11-21 NOTE — Anesthesia Procedure Notes (Signed)
Central Venous Catheter Insertion Performed by: Belinda Block, MD, anesthesiologist Start/End12/05/2020 7:05 AM, 11/21/2020 8:20 AM Patient location: Pre-op. Preanesthetic checklist: patient identified, IV checked, site marked, risks and benefits discussed, surgical consent, monitors and equipment checked, pre-op evaluation, timeout performed and anesthesia consent Position: Trendelenburg Lidocaine 1% used for infiltration and patient sedated Hand hygiene performed  and maximum sterile barriers used  PA cath was placed.Procedure performed without using ultrasound guided technique. Ultrasound Notes:anatomy identified and image(s) printed for medical record Attempts: 1 Following insertion, line sutured, dressing applied and Biopatch. Post procedure assessment: blood return through all ports  Patient tolerated the procedure well with no immediate complications.

## 2020-11-21 NOTE — Progress Notes (Signed)
Patient ID: Eddie Fox, male   DOB: 1941-10-08, 79 y.o.   MRN: [id number] EVENING ROUNDS NOTE :     [address]       [place] [address]             [phone]                 Day of Surgery Procedure(s) (LRB): CORONARY ARTERY BYPASS GRAFTING (CABG) X 4, USING LEFT INTERNAL MAMMARY ARTERY AND RIGHT LEG GREATER SAPHENOUS VEIN HARVESTED ENDOSCOPICALLY (N/A) TRANSESOPHAGEAL ECHOCARDIOGRAM (TEE) (N/A)  Total Length of Stay:  LOS: 3 days  BP 117/65   Pulse (!) 50   Temp 97.7 F (36.5 C) (Core)   Resp 19   Ht 5\' 10"  (1.778 m)   Wt 70.9 kg Comment: scale a  SpO2 96%   BMI 22.43 kg/m   .Intake/Output      12/05 0701 - 12/06 0700 12/06 0701 - 12/07 0700   P.O. 580    I.V. (mL/kg) 338.9 (4.8) 2221.3 (31.3)   Blood  325   IV Piggyback  794.2   Total Intake(mL/kg) 918.9 (13) 3340.4 (47.1)   Urine (mL/kg/hr) 1425 (0.8) 1435 (1.8)   Emesis/NG output  170   Other  3424   Stool 0    Blood  658   Chest Tube  250   Total Output 1425 5937   Net -506.1 -2596.6        Stool Occurrence 1 x      . sodium chloride Stopped (11/21/20 1407)  . [START ON 11/22/2020] sodium chloride    . sodium chloride    . albumin human 12.5 g (11/21/20 1408)  . cefUROXime (ZINACEF)  IV Stopped (11/21/20 1652)  . dexmedetomidine (PRECEDEX) IV infusion Stopped (11/21/20 1609)  . famotidine (PEPCID) IV Stopped (11/21/20 1437)  . insulin 0.8 mL/hr at 11/21/20 1739  . lactated ringers    . lactated ringers    . lactated ringers 20 mL/hr at 11/21/20 1739  . magnesium sulfate 20 mL/hr at 11/21/20 1739  . niCARDipine    . nitroGLYCERIN Stopped (11/21/20 1624)  . norepinephrine (LEVOPHED) Adult infusion    . phenylephrine (NEO-SYNEPHRINE) Adult infusion 22 mcg/min (11/21/20 1739)  . vancomycin       Lab Results  Component Value Date   WBC 6.8 11/21/2020   HGB 9.5 (L) 11/21/2020   HCT 28.0 (L) 11/21/2020   PLT 64 (L) 11/21/2020   GLUCOSE 113 (H) 11/21/2020   CHOL 144  11/23/2019   TRIG 105.0 11/23/2019   HDL 41.00 11/23/2019   LDLDIRECT 171.9 07/08/2007   LDLCALC 82 11/23/2019   ALT 11 11/21/2020   AST 15 11/21/2020   NA 142 11/21/2020   K 3.8 11/21/2020   CL 106 11/21/2020   CREATININE 0.80 11/21/2020   BUN 12 11/21/2020   CO2 24 11/21/2020   TSH 2.98 09/26/2020   PSA 1.79 11/19/2017   INR 1.6 (H) 11/21/2020   HGBA1C 4.7 (L) 11/20/2020   Back up pacing 60, no a wires  Extubated, neuro intact Not bleeding   Grace Isaac MD  Beeper [phone] Office [phone] 11/21/2020 6:04 PM

## 2020-11-21 NOTE — Progress Notes (Signed)
Patient's post extubation ABG indicated low HCO3. Dr. Servando Snare paged, order given for 1 amp of bicarb.

## 2020-11-21 NOTE — Transfer of Care (Signed)
Immediate Anesthesia Transfer of Care Note  Patient: Eddie Fox  Procedure(s) Performed: CORONARY ARTERY BYPASS GRAFTING (CABG) X 4, USING LEFT INTERNAL MAMMARY ARTERY AND RIGHT LEG GREATER SAPHENOUS VEIN HARVESTED ENDOSCOPICALLY (N/A Chest) TRANSESOPHAGEAL ECHOCARDIOGRAM (TEE) (N/A )  Patient Location: SICU  Anesthesia Type:General  Level of Consciousness: sedated and Patient remains intubated per anesthesia plan  Airway & Oxygen Therapy: Patient remains intubated per anesthesia plan and Patient placed on Ventilator (see vital sign flow sheet for setting)  Post-op Assessment: Report given to RN and Post -op Vital signs reviewed and stable  Post vital signs: Reviewed and stable  Last Vitals:  Vitals Value Taken Time  BP 117/65 11/21/20 1316  Temp 36.5 C 11/21/20 1338  Pulse 58 11/21/20 1338  Resp 12 11/21/20 1338  SpO2 96 % 11/21/20 1338  Vitals shown include unvalidated device data.  Last Pain:  Vitals:   11/21/20 0515  TempSrc: Oral  PainSc:       Patients Stated Pain Goal: 3 (91/98/02 2179)  Complications: No complications documented.

## 2020-11-21 NOTE — Plan of Care (Signed)
  Problem: Education: Goal: Knowledge of General Education information will improve Description: Including pain rating scale, medication(s)/side effects and non-pharmacologic comfort measures Outcome: Progressing   Problem: Health Behavior/Discharge Planning: Goal: Ability to manage health-related needs will improve Outcome: Progressing   Problem: Clinical Measurements: Goal: Ability to maintain clinical measurements within normal limits will improve Outcome: Progressing Goal: Will remain free from infection Outcome: Progressing Goal: Diagnostic test results will improve Outcome: Progressing Goal: Respiratory complications will improve Outcome: Progressing Goal: Cardiovascular complication will be avoided Outcome: Progressing   Problem: Activity: Goal: Risk for activity intolerance will decrease Outcome: Progressing   Problem: Coping: Goal: Level of anxiety will decrease Outcome: Progressing   Problem: Cardiac: Goal: Will achieve and/or maintain hemodynamic stability Outcome: Progressing   Problem: Clinical Measurements: Goal: Postoperative complications will be avoided or minimized Outcome: Progressing   Problem: Respiratory: Goal: Respiratory status will improve Outcome: Progressing   Problem: Skin Integrity: Goal: Wound healing without signs and symptoms of infection Outcome: Progressing Goal: Risk for impaired skin integrity will decrease Outcome: Progressing   Problem: Urinary Elimination: Goal: Ability to achieve and maintain adequate renal perfusion and functioning will improve Outcome: Progressing

## 2020-11-21 NOTE — Procedures (Signed)
Extubation Procedure Note  Patient Details:   Name: Eddie Fox DOB: 1941-03-05 MRN: [id number]   Airway Documentation:  Airway 8 mm (Active)  Secured at (cm) 23 cm 11/21/20 1316  Measured From Lips 11/21/20 1316  Secured Location Right 11/21/20 1316  Secured By [employer] 11/21/20 1316  Prone position No 11/21/20 1316  Site Condition Dry 11/21/20 1316   Vent end date: (not recorded) Vent end time: (not recorded)   Evaluation  O2 sats: stable throughout Complications: No apparent complications Patient did tolerate procedure well. Bilateral Breath Sounds: Clear, Diminished   Yes   Patient was able to perform a VC of 900 ml's and a NIF of -20. Patient was extubated to a 4L [place]. Cuff leak was heard. No stridor was noted. RN at the bedside with RT during extubation. Patient tolerated well.  Tiburcio Bash 11/21/2020, 5:30 PM

## 2020-11-21 NOTE — Plan of Care (Signed)
  Problem: Elimination: Goal: Will not experience complications related to bowel motility Outcome: Completed/Met

## 2020-11-22 ENCOUNTER — Encounter (HOSPITAL_COMMUNITY): Payer: Self-pay | Admitting: Thoracic Surgery (Cardiothoracic Vascular Surgery)

## 2020-11-22 ENCOUNTER — Inpatient Hospital Stay (HOSPITAL_COMMUNITY): Payer: Medicare Other

## 2020-11-22 LAB — CBC
HCT: 26.9 % — ABNORMAL LOW (ref 39.0–52.0)
HCT: 26.1 % — ABNORMAL LOW (ref 39.0–52.0)
Hemoglobin: 9.8 g/dL — ABNORMAL LOW (ref 13.0–17.0)
Hemoglobin: 8.8 g/dL — ABNORMAL LOW (ref 13.0–17.0)
MCH: 33.3 pg (ref 26.0–34.0)
MCH: 32.1 pg (ref 26.0–34.0)
MCHC: 36.4 g/dL — ABNORMAL HIGH (ref 30.0–36.0)
MCHC: 33.7 g/dL (ref 30.0–36.0)
MCV: 91.5 fL (ref 80.0–100.0)
MCV: 95.3 fL (ref 80.0–100.0)
Platelets: 89 10*3/uL — ABNORMAL LOW (ref 150–400)
Platelets: 95 10*3/uL — ABNORMAL LOW (ref 150–400)
RBC: 2.94 MIL/uL — ABNORMAL LOW (ref 4.22–5.81)
RBC: 2.74 MIL/uL — ABNORMAL LOW (ref 4.22–5.81)
RDW: 13.9 % (ref 11.5–15.5)
RDW: 14.2 % (ref 11.5–15.5)
WBC: 7.9 10*3/uL (ref 4.0–10.5)
WBC: 8.8 10*3/uL (ref 4.0–10.5)
nRBC: 0 % (ref 0.0–0.2)
nRBC: 0 % (ref 0.0–0.2)

## 2020-11-22 LAB — BASIC METABOLIC PANEL
Anion gap: 8 (ref 5–15)
Anion gap: 9 (ref 5–15)
BUN: 14 mg/dL (ref 8–23)
BUN: 19 mg/dL (ref 8–23)
CO2: 22 mmol/L (ref 22–32)
CO2: 20 mmol/L — ABNORMAL LOW (ref 22–32)
Calcium: 7.6 mg/dL — ABNORMAL LOW (ref 8.9–10.3)
Calcium: 7.8 mg/dL — ABNORMAL LOW (ref 8.9–10.3)
Chloride: 110 mmol/L (ref 98–111)
Chloride: 111 mmol/L (ref 98–111)
Creatinine, Ser: 1.01 mg/dL (ref 0.61–1.24)
Creatinine, Ser: 0.97 mg/dL (ref 0.61–1.24)
GFR, Estimated: >60 mL/min (ref >60)
GFR, Estimated: >60 mL/min (ref >60)
Glucose, Bld: 126 mg/dL — ABNORMAL HIGH (ref 70–99)
Glucose, Bld: 114 mg/dL — ABNORMAL HIGH (ref 70–99)
Potassium: 4.2 mmol/L (ref 3.5–5.1)
Potassium: 3.9 mmol/L (ref 3.5–5.1)
Sodium: 140 mmol/L (ref 135–145)
Sodium: 140 mmol/L (ref 135–145)

## 2020-11-22 LAB — GLUCOSE, CAPILLARY
Glucose-Capillary: 110 mg/dL — ABNORMAL HIGH (ref 70–99)
Glucose-Capillary: 112 mg/dL — ABNORMAL HIGH (ref 70–99)
Glucose-Capillary: 112 mg/dL — ABNORMAL HIGH (ref 70–99)
Glucose-Capillary: 116 mg/dL — ABNORMAL HIGH (ref 70–99)
Glucose-Capillary: 118 mg/dL — ABNORMAL HIGH (ref 70–99)
Glucose-Capillary: 118 mg/dL — ABNORMAL HIGH (ref 70–99)
Glucose-Capillary: 129 mg/dL — ABNORMAL HIGH (ref 70–99)
Glucose-Capillary: 131 mg/dL — ABNORMAL HIGH (ref 70–99)
Glucose-Capillary: 131 mg/dL — ABNORMAL HIGH (ref 70–99)
Glucose-Capillary: 132 mg/dL — ABNORMAL HIGH (ref 70–99)
Glucose-Capillary: 134 mg/dL — ABNORMAL HIGH (ref 70–99)
Glucose-Capillary: 99 mg/dL (ref 70–99)

## 2020-11-22 LAB — MAGNESIUM
Magnesium: 2.7 mg/dL — ABNORMAL HIGH (ref 1.7–2.4)
Magnesium: 2.5 mg/dL — ABNORMAL HIGH (ref 1.7–2.4)

## 2020-11-22 MED ORDER — ENOXAPARIN SODIUM 40 MG/0.4ML ~~LOC~~ SOLN
40.0000 mg | Freq: Every day | SUBCUTANEOUS | Status: DC
  Administered 2020-11-22 – 2020-11-24 (×3): 40 mg via SUBCUTANEOUS
  Filled 2020-11-22 (×3): qty 0.4

## 2020-11-22 MED ORDER — ALBUMIN HUMAN 5 % IV SOLN
25.0000 g | Freq: Once | INTRAVENOUS | Status: AC
  Administered 2020-11-22: 25 g via INTRAVENOUS
  Filled 2020-11-22: qty 500

## 2020-11-22 MED ORDER — INSULIN ASPART 100 UNIT/ML ~~LOC~~ SOLN
0.0000 [IU] | SUBCUTANEOUS | Status: DC
  Administered 2020-11-22: 2 [IU] via SUBCUTANEOUS
  Administered 2020-11-23: 1 [IU] via SUBCUTANEOUS

## 2020-11-22 MED ORDER — PROMETHAZINE HCL 25 MG/ML IJ SOLN
12.5000 mg | Freq: Four times a day (QID) | INTRAMUSCULAR | Status: DC | PRN
  Administered 2020-11-22: 12.5 mg via INTRAVENOUS
  Filled 2020-11-22: qty 1

## 2020-11-22 NOTE — Progress Notes (Signed)
[facility] BRIEF SICU PROGRESS NOTE  1 Day Post-Op  S/P Procedure(s) (LRB): CORONARY ARTERY BYPASS GRAFTING (CABG) X 4, USING LEFT INTERNAL MAMMARY ARTERY AND RIGHT LEG GREATER SAPHENOUS VEIN HARVESTED ENDOSCOPICALLY (N/A) TRANSESOPHAGEAL ECHOCARDIOGRAM (TEE) (N/A)   Stable day NSR w/ stable BP Breathing comfortably w/ O2 sats 95% on 2 L/min UOP adequate  Plan: Continue current plan  Rexene Alberts, MD 11/22/2020 5:15 PM

## 2020-11-22 NOTE — Progress Notes (Signed)
Paged Dr. Kipp Brood regarding low urinary output of 184mL so far today.  Verbal order given for 500cc albumin and to keep foley in place at this time.

## 2020-11-22 NOTE — Progress Notes (Signed)
[address]       [place] [address]             [phone]                 1 Day Post-Op Procedure(s) (LRB): CORONARY ARTERY BYPASS GRAFTING (CABG) X 4, USING LEFT INTERNAL MAMMARY ARTERY AND RIGHT LEG GREATER SAPHENOUS VEIN HARVESTED ENDOSCOPICALLY (N/A) TRANSESOPHAGEAL ECHOCARDIOGRAM (TEE) (N/A)   Events: No events overnight Nausea earlier today  _______________________________________________________________ Vitals: BP (!) 118/48 (BP Location: Left Arm)   Pulse (!) 55   Temp 98.1 F (36.7 C)   Resp 16   Ht 5\' 10"  (1.778 m)   Wt 75.6 kg   SpO2 94%   BMI 23.91 kg/m   - Neuro: Alert NAD  - Cardiovascular: Sinus bradycardia  Drips: Neo-Synephrine at 60.   CVP:  [1 mmHg-16 mmHg] 5 mmHg  - Pulm: Easy work of breathing.  serosanguineous chest tube  ABG    Component Value Date/Time   PHART 7.315 (L) 11/21/2020 2247   PCO2ART 36.6 11/21/2020 2247   PO2ART 85 11/21/2020 2247   HCO3 18.7 (L) 11/21/2020 2247   TCO2 20 (L) 11/21/2020 2247   ACIDBASEDEF 7.0 (H) 11/21/2020 2247   O2SAT 96.0 11/21/2020 2247    - Abd: Soft - Extremity: Warm  .Intake/Output      12/06 0701 - 12/07 0700 12/07 0701 - 12/08 0700   P.O.     I.V. (mL/kg) 3223.2 (42.6) 85.8 (1.1)   Blood 325    IV Piggyback 1150.2    Total Intake(mL/kg) 4698.4 (62.1) 85.8 (1.1)   Urine (mL/kg/hr) 1985 (1.1) 50 (0.3)   Emesis/NG output 170    Other 3424    Stool     Blood 658    Chest Tube 980 50   Total Output 7217 100   Net -2518.6 -14.2           _______________________________________________________________ Labs: CBC Latest Ref Rng & Units 11/22/2020 11/21/2020 11/21/2020  WBC 4.0 - 10.5 K/uL 7.9 - 12.0(H)  Hemoglobin 13.0 - 17.0 g/dL 9.8(L) 9.9(L) 11.5(L)  Hematocrit 39 - 52 % 26.9(L) 29.0(L) 32.0(L)  Platelets 150 - 400 K/uL 89(L) - 105(L)   CMP Latest Ref Rng & Units 11/22/2020 11/21/2020 11/21/2020  Glucose 70 - 99 mg/dL 126(H) - 140(H)  BUN 8 - 23 mg/dL 14 - 14   Creatinine 0.61 - 1.24 mg/dL 1.01 - 1.06  Sodium 135 - 145 mmol/L 140 143 139  Potassium 3.5 - 5.1 mmol/L 4.2 4.0 4.4  Chloride 98 - 111 mmol/L 110 - 112(H)  CO2 22 - 32 mmol/L 22 - 20(L)  Calcium 8.9 - 10.3 mg/dL 7.6(L) - 7.5(L)  Total Protein 6.5 - 8.1 g/dL - - -  Total Bilirubin 0.3 - 1.2 mg/dL - - -  Alkaline Phos 38 - 126 U/L - - -  AST 15 - 41 U/L - - -  ALT 0 - 44 U/L - - -    CXR: Small left effusion, small left pneumothorax  _______________________________________________________________  Assessment and Plan: POD1 status post CABG   Neuro: Pain controlled CV: wean neo.  Will remove A line once off pressors.  Will keep wires for now.  Will hold BB.  On A/S Pulm: continue pulm toilet.  Will keep tubes for now Renal: stable GI: adding phenergan for nausea.  Advancing diet Heme: stable ID: [email or web address] Endo: SSI Dispo: continue ICU care.  Melodie Bouillon, MD 11/22/2020 9:03  AM

## 2020-11-23 ENCOUNTER — Inpatient Hospital Stay (HOSPITAL_COMMUNITY): Payer: Medicare Other

## 2020-11-23 LAB — CBC
HCT: 24.8 % — ABNORMAL LOW (ref 39.0–52.0)
Hemoglobin: 8.4 g/dL — ABNORMAL LOW (ref 13.0–17.0)
MCH: 31.9 pg (ref 26.0–34.0)
MCHC: 33.9 g/dL (ref 30.0–36.0)
MCV: 94.3 fL (ref 80.0–100.0)
Platelets: 87 10*3/uL — ABNORMAL LOW (ref 150–400)
RBC: 2.63 MIL/uL — ABNORMAL LOW (ref 4.22–5.81)
RDW: 14.1 % (ref 11.5–15.5)
WBC: 6.5 10*3/uL (ref 4.0–10.5)
nRBC: 0 % (ref 0.0–0.2)

## 2020-11-23 LAB — GLUCOSE, CAPILLARY
Glucose-Capillary: 100 mg/dL — ABNORMAL HIGH (ref 70–99)
Glucose-Capillary: 109 mg/dL — ABNORMAL HIGH (ref 70–99)
Glucose-Capillary: 112 mg/dL — ABNORMAL HIGH (ref 70–99)
Glucose-Capillary: 117 mg/dL — ABNORMAL HIGH (ref 70–99)
Glucose-Capillary: 122 mg/dL — ABNORMAL HIGH (ref 70–99)
Glucose-Capillary: 95 mg/dL (ref 70–99)
Glucose-Capillary: 97 mg/dL (ref 70–99)

## 2020-11-23 LAB — BASIC METABOLIC PANEL
Anion gap: 8 (ref 5–15)
BUN: 20 mg/dL (ref 8–23)
CO2: 22 mmol/L (ref 22–32)
Calcium: 8 mg/dL — ABNORMAL LOW (ref 8.9–10.3)
Chloride: 110 mmol/L (ref 98–111)
Creatinine, Ser: 0.86 mg/dL (ref 0.61–1.24)
GFR, Estimated: >60 mL/min (ref >60)
Glucose, Bld: 118 mg/dL — ABNORMAL HIGH (ref 70–99)
Potassium: 3.9 mmol/L (ref 3.5–5.1)
Sodium: 140 mmol/L (ref 135–145)

## 2020-11-23 MED ORDER — SODIUM CHLORIDE 0.9 % IV SOLN: 250.0000 mL | INTRAVENOUS | Status: DC | PRN

## 2020-11-23 MED ORDER — SODIUM CHLORIDE 0.9% FLUSH: 3.0000 mL | INTRAVENOUS | Status: DC | PRN

## 2020-11-23 MED ORDER — FUROSEMIDE 40 MG PO TABS
40.0000 mg | ORAL_TABLET | Freq: Every day | ORAL | Status: DC
  Administered 2020-11-24 – 2020-11-25 (×2): 40 mg via ORAL
  Filled 2020-11-23 (×2): qty 1

## 2020-11-23 MED ORDER — POTASSIUM CHLORIDE CRYS ER 20 MEQ PO TBCR
40.0000 meq | EXTENDED_RELEASE_TABLET | Freq: Every day | ORAL | Status: DC
  Administered 2020-11-23: 40 meq via ORAL
  Filled 2020-11-23: qty 2

## 2020-11-23 MED ORDER — SODIUM CHLORIDE 0.9% FLUSH
3.0000 mL | Freq: Two times a day (BID) | INTRAVENOUS | Status: DC
  Administered 2020-11-23 – 2020-11-25 (×4): 3 mL via INTRAVENOUS

## 2020-11-23 MED ORDER — PROMETHAZINE HCL 25 MG/ML IJ SOLN: 12.5000 mg | Freq: Four times a day (QID) | INTRAMUSCULAR | Status: DC | PRN

## 2020-11-23 MED ORDER — FUROSEMIDE 10 MG/ML IJ SOLN
40.0000 mg | Freq: Once | INTRAMUSCULAR | Status: AC
  Administered 2020-11-23: 40 mg via INTRAVENOUS
  Filled 2020-11-23: qty 4

## 2020-11-23 MED ORDER — MIDODRINE HCL 5 MG PO TABS
5.0000 mg | ORAL_TABLET | Freq: Three times a day (TID) | ORAL | Status: DC
Start: 2020-11-23 — End: 2020-11-23

## 2020-11-23 MED ORDER — ~~LOC~~ CARDIAC SURGERY, PATIENT & FAMILY EDUCATION: Freq: Once | Status: AC

## 2020-11-23 MED ORDER — GUAIFENESIN-DM 100-10 MG/5ML PO SYRP
15.0000 mL | ORAL_SOLUTION | ORAL | Status: DC | PRN
  Administered 2020-11-23 – 2020-11-25 (×10): 15 mL via ORAL
  Filled 2020-11-23 (×10): qty 15

## 2020-11-23 MED FILL — Heparin Sodium (Porcine) Inj 1000 Unit/ML: INTRAMUSCULAR | Qty: 10 | Status: AC

## 2020-11-23 MED FILL — Magnesium Sulfate Inj 50%: INTRAMUSCULAR | Qty: 10 | Status: AC

## 2020-11-23 MED FILL — Potassium Chloride Inj 2 mEq/ML: INTRAVENOUS | Qty: 40 | Status: AC

## 2020-11-23 MED FILL — Electrolyte-R (PH 7.4) Solution: INTRAVENOUS | Qty: 5000 | Status: AC

## 2020-11-23 MED FILL — Heparin Sodium (Porcine) Inj 1000 Unit/ML: INTRAMUSCULAR | Qty: 30 | Status: AC

## 2020-11-23 MED FILL — Mannitol IV Soln 20%: INTRAVENOUS | Qty: 500 | Status: AC

## 2020-11-23 MED FILL — Sodium Bicarbonate IV Soln 8.4%: INTRAVENOUS | Qty: 50 | Status: AC

## 2020-11-23 MED FILL — Sodium Chloride IV Soln 0.9%: INTRAVENOUS | Qty: 2000 | Status: AC

## 2020-11-23 NOTE — Progress Notes (Signed)
      [address]       [place] [address]             [phone]                 2 Days Post-Op Procedure(s) (LRB): CORONARY ARTERY BYPASS GRAFTING (CABG) X 4, USING LEFT INTERNAL MAMMARY ARTERY AND RIGHT LEG GREATER SAPHENOUS VEIN HARVESTED ENDOSCOPICALLY (N/A) TRANSESOPHAGEAL ECHOCARDIOGRAM (TEE) (N/A)   Events: No events overnight Nausea earlier today  _______________________________________________________________ Vitals: BP (!) 119/47   Pulse 80   Temp 98.5 F (36.9 C) (Oral)   Resp (!) 23   Ht 5\' 10"  (1.778 m)   Wt 75.6 kg   SpO2 97%   BMI 23.91 kg/m   - Neuro: Alert NAD  - Cardiovascular: Sinus bradycardia  Drips: Neo-Synephrine at 60.      - Pulm: Easy work of breathing.  serosanguineous chest tube  ABG    Component Value Date/Time   PHART 7.315 (L) 11/21/2020 2247   PCO2ART 36.6 11/21/2020 2247   PO2ART 85 11/21/2020 2247   HCO3 18.7 (L) 11/21/2020 2247   TCO2 20 (L) 11/21/2020 2247   ACIDBASEDEF 7.0 (H) 11/21/2020 2247   O2SAT 96.0 11/21/2020 2247    - Abd: Soft - Extremity: Warm  .Intake/Output      12/07 0701 - 12/08 0700 12/08 0701 - 12/09 0700   P.O. 0 240   I.V. (mL/kg) 786.9 (10.4)    Blood     IV Piggyback 700.1    Total Intake(mL/kg) 1487 (19.7) 240 (3.2)   Urine (mL/kg/hr) 775 (0.4)    Emesis/NG output     Other     Blood     Chest Tube 450    Total Output 1225    Net +262 +240           _______________________________________________________________ Labs: CBC Latest Ref Rng & Units 11/23/2020 11/22/2020 11/22/2020  WBC 4.0 - 10.5 K/uL 6.5 8.8 7.9  Hemoglobin 13.0 - 17.0 g/dL 8.4(L) 8.8(L) 9.8(L)  Hematocrit 39 - 52 % 24.8(L) 26.1(L) 26.9(L)  Platelets 150 - 400 K/uL 87(L) 95(L) 89(L)   CMP Latest Ref Rng & Units 11/23/2020 11/22/2020 11/22/2020  Glucose 70 - 99 mg/dL 118(H) 114(H) 126(H)  BUN 8 - 23 mg/dL 20 19 14   Creatinine 0.61 - 1.24 mg/dL 0.86 0.97 1.01  Sodium 135 - 145 mmol/L 140 140 140   Potassium 3.5 - 5.1 mmol/L 3.9 3.9 4.2  Chloride 98 - 111 mmol/L 110 111 110  CO2 22 - 32 mmol/L 22 20(L) 22  Calcium 8.9 - 10.3 mg/dL 8.0(L) 7.8(L) 7.6(L)  Total Protein 6.5 - 8.1 g/dL - - -  Total Bilirubin 0.3 - 1.2 mg/dL - - -  Alkaline Phos 38 - 126 U/L - - -  AST 15 - 41 U/L - - -  ALT 0 - 44 U/L - - -    CXR: Small left effusion, small left pneumothorax  _______________________________________________________________  Assessment and Plan: POD2 status post CABG   Neuro: Pain controlled CV: wean neo.  Will start low dose BB.  On A/S Pulm: continue pulm toilet.  Will remove chest tubes later today Renal: stable.  Will diurese today GI: on diet Heme: stable ID: [email or web address] Endo: SSI Dispo: floor today  Melodie Bouillon, MD 11/23/2020 9:03 AM

## 2020-11-23 NOTE — Evaluation (Signed)
Physical Therapy Evaluation Patient Details Name: Eddie Fox MRN: [id number] DOB: [birth year] Today's Date: 11/23/2020   History of Present Illness  This is a 79 year old male who underwent an elective left heart cath after being worked up for positive stress test. 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 His ejection fraction is preserved and he has no significant valvular disease. On review of his left heart cath he has good targets in his LAD and PDA. His circumflex distribution is small and fills from right to left collaterals distally. 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    Clinical Impression  Pt admitted with above diagnosis. Pt was able to ambulate in room short distance and was limited by desaturation and by back pain. Needed 4LO2 up from [place] at rest to maintain sats. Should progress well.  Pt currently with functional limitations due to the deficits listed below (see PT Problem List). Pt will benefit from skilled PT to increase their independence and safety with mobility to allow discharge to the venue listed below.      Follow Up Recommendations Home health PT;Supervision/Assistance - 24 hour    Equipment Recommendations  3in1 (PT)    Recommendations for Other Services       Precautions / Restrictions Precautions: Chest tube Precautions: Fall;Sternal Precaution Booklet Issued: Yes (comment) Restrictions Weight Bearing Restrictions: No      Mobility  Bed Mobility Overal bed mobility: Needs Assistance Bed Mobility: Rolling;Sidelying to Sit Rolling: Min guard Sidelying to sit: Min guard;Min assist       General bed mobility comments: cues needed for technique to maintain sternal precautions    Transfers Overall transfer level: Needs assistance Equipment used: Rolling walker (2 wheeled) Transfers: Sit to/from Stand Sit to Stand: Min guard         General transfer comment: Able to stand to RW with min guard assist. Pt with initial need for steadying and c/o back pain  but was able to maintain standing balance once he was oriented to upright.   Ambulation/Gait Ambulation/Gait assistance: Min guard Gait Distance (Feet): 12 Feet (4 feet x 3) Assistive device: Rolling walker (2 wheeled) Gait Pattern/deviations: Step-through pattern;Decreased stride length;Trunk flexed   Gait velocity interpretation: <1.31 ft/sec, indicative of household ambulator General Gait Details: Pt walked forward and back to bed x 3 x limited due to pts back pain.  Pt also desaturated below 88% on 2L therefore incr to 4L for walk and then back to 2L at end of walk.    Stairs            Wheelchair Mobility    Modified Rankin (Stroke Patients Only)       Balance Overall balance assessment: Needs assistance Sitting-balance support: No upper extremity supported;Feet supported Sitting balance-Leahy Scale: Fair     Standing balance support: Bilateral upper extremity supported;During functional activity Standing balance-Leahy Scale: Poor Standing balance comment: relies on UE support and external support.                              Pertinent Vitals/Pain Pain Assessment: No/denies pain    Home Living Family/patient expects to be discharged to:: Private residence Living Arrangements: Alone Available Help at Discharge: Family;Available 24 hours/day (daughter and nephew to help pt) Type of Home: House Home Access: Stairs to enter   [employer] of Steps: 1 Home Layout: One level Home Equipment: Walker - 2 wheels      Prior Function Level of Independence:  Independent         Comments: Drives, retired, hunts Theatre manager Dominance   Dominant Hand: Right    Extremity/Trunk Assessment   Upper Extremity Assessment Upper Extremity Assessment: Defer to OT evaluation    Lower Extremity Assessment Lower Extremity Assessment: Generalized weakness    Cervical / Trunk Assessment Cervical / Trunk Assessment: Normal  Communication    Communication: No difficulties  Cognition Arousal/Alertness: Awake/alert Behavior During Therapy: WFL for tasks assessed/performed Overall Cognitive Status: Within Functional Limits for tasks assessed                                        General Comments General comments (skin integrity, edema, etc.): 82 bpm, 94% 2L, 161/54; HR stable with walk, desat to 85% on 2L therefore incr O2 to 4L for walk to maintain sats and then back to 2L at end of treatment.     Exercises     Assessment/Plan    PT Assessment Patient needs continued PT services  PT Problem List Decreased mobility;Decreased coordination;Decreased balance;Decreased activity tolerance;Decreased knowledge of precautions;Cardiopulmonary status limiting activity;Decreased safety awareness;Decreased knowledge of use of DME       PT Treatment Interventions DME instruction;Gait training;Functional mobility training;Therapeutic activities;Therapeutic exercise;Balance training;Patient/family education;Stair training    PT Goals (Current goals can be found in the Care Plan section)  Acute Rehab PT Goals Patient Stated Goal: to go home PT Goal Formulation: With patient Time For Goal Achievement: 12/07/20 Potential to Achieve Goals: Good    Frequency Min 3X/week   Barriers to discharge        Co-evaluation               AM-PAC PT "6 Clicks" Mobility  Outcome Measure Help needed turning from your back to your side while in a flat bed without using bedrails?: A Little Help needed moving from lying on your back to sitting on the side of a flat bed without using bedrails?: A Little Help needed moving to and from a bed to a chair (including a wheelchair)?: A Little Help needed standing up from a chair using your arms (e.g., wheelchair or bedside chair)?: A Little Help needed to walk in hospital room?: A Little Help needed climbing 3-5 steps with a railing? : A Little 6 Click Score: 18    End of Session  Equipment Utilized During Treatment: Gait belt;Oxygen Activity Tolerance: Patient limited by fatigue Patient left: in bed;with call bell/phone within reach Nurse Communication: Mobility status PT Visit Diagnosis: Muscle weakness (generalized) (M62.81)    Time: 6812-7517 PT Time Calculation (min) (ACUTE ONLY): 21 min   Charges:   PT Evaluation $PT Eval Moderate Complexity: 1 Mod          Peyton Rossner W,PT Acute Rehabilitation Services Pager:  [phone]  Office:  [place] 11/23/2020, 1:42 PM

## 2020-11-24 LAB — BPAM RBC
Blood Product Expiration Date: 202201012359
Blood Product Expiration Date: 202201012359
Blood Product Expiration Date: 202201022359
Blood Product Expiration Date: 202201022359
ISSUE DATE / TIME: 202112060954
ISSUE DATE / TIME: 202112060954
Unit Type and Rh: 6200
Unit Type and Rh: 6200
Unit Type and Rh: 6200
Unit Type and Rh: 6200

## 2020-11-24 LAB — BASIC METABOLIC PANEL
Anion gap: 7 (ref 5–15)
BUN: 25 mg/dL — ABNORMAL HIGH (ref 8–23)
CO2: 25 mmol/L (ref 22–32)
Calcium: 8.1 mg/dL — ABNORMAL LOW (ref 8.9–10.3)
Chloride: 109 mmol/L (ref 98–111)
Creatinine, Ser: 0.96 mg/dL (ref 0.61–1.24)
GFR, Estimated: >60 mL/min (ref >60)
Glucose, Bld: 101 mg/dL — ABNORMAL HIGH (ref 70–99)
Potassium: 3.8 mmol/L (ref 3.5–5.1)
Sodium: 141 mmol/L (ref 135–145)

## 2020-11-24 LAB — TYPE AND SCREEN
ABO/RH(D): AB POS
Antibody Screen: NEGATIVE
Unit division: 0
Unit division: 0
Unit division: 0
Unit division: 0

## 2020-11-24 LAB — CBC
HCT: 24.4 % — ABNORMAL LOW (ref 39.0–52.0)
Hemoglobin: 8.2 g/dL — ABNORMAL LOW (ref 13.0–17.0)
MCH: 31.7 pg (ref 26.0–34.0)
MCHC: 33.6 g/dL (ref 30.0–36.0)
MCV: 94.2 fL (ref 80.0–100.0)
Platelets: 105 10*3/uL — ABNORMAL LOW (ref 150–400)
RBC: 2.59 MIL/uL — ABNORMAL LOW (ref 4.22–5.81)
RDW: 13.7 % (ref 11.5–15.5)
WBC: 7.5 10*3/uL (ref 4.0–10.5)
nRBC: 0 % (ref 0.0–0.2)

## 2020-11-24 LAB — GLUCOSE, CAPILLARY: Glucose-Capillary: 86 mg/dL (ref 70–99)

## 2020-11-24 MED ORDER — POTASSIUM CHLORIDE CRYS ER 20 MEQ PO TBCR
30.0000 meq | EXTENDED_RELEASE_TABLET | Freq: Two times a day (BID) | ORAL | Status: AC
  Administered 2020-11-24 (×2): 30 meq via ORAL
  Filled 2020-11-24 (×2): qty 1

## 2020-11-24 MED ORDER — POTASSIUM CHLORIDE CRYS ER 20 MEQ PO TBCR
20.0000 meq | EXTENDED_RELEASE_TABLET | Freq: Every day | ORAL | Status: DC
  Administered 2020-11-25: 20 meq via ORAL
  Filled 2020-11-24: qty 1

## 2020-11-24 MED ORDER — PHENOL 1.4 % MT LIQD: 1.0000 | OROMUCOSAL | Status: DC | PRN

## 2020-11-24 MED ORDER — INSULIN ASPART 100 UNIT/ML ~~LOC~~ SOLN: 0.0000 [IU] | Freq: Three times a day (TID) | SUBCUTANEOUS | Status: DC

## 2020-11-24 MED ORDER — GUAIFENESIN ER 600 MG PO TB12: 600.0000 mg | ORAL_TABLET | Freq: Two times a day (BID) | ORAL | Status: DC

## 2020-11-24 NOTE — Progress Notes (Signed)
CARDIAC REHAB PHASE I   PRE:  Rate/Rhythm: 76 SR    BP: sitting 141/61    SaO2: 99 3L  MODE:  Ambulation: 470 ft   POST:  Rate/Rhythm: 91 SR    BP: sitting 145/57     SaO2: 100 2L, 96 RA  Pt in bed. Able to move to EOB with min assist, reminders for sternal precautions. Stood with min assist. Used gait belt but was steady in hall with RW. Used 2L O2 then d/c'd after walk. To recliner. No c/o. Feels well. Encouraged IS and x2 more walks today. Pt very HOH. 1255-1340   [place], ACSM 11/24/2020 1:53 PM

## 2020-11-24 NOTE — Progress Notes (Addendum)
      [address]       [place] [address]             [phone]        3 Days Post-Op Procedure(s) (LRB): CORONARY ARTERY BYPASS GRAFTING (CABG) X 4, USING LEFT INTERNAL MAMMARY ARTERY AND RIGHT LEG GREATER SAPHENOUS VEIN HARVESTED ENDOSCOPICALLY (N/A) TRANSESOPHAGEAL ECHOCARDIOGRAM (TEE) (N/A)  Subjective: Patient has cough and sore throat. He is passing flatus.  Objective: Vital signs in last 24 hours: Temp:  [98.7 F (37.1 C)-99.2 F (37.3 C)] 98.7 F (37.1 C) (12/08 2344) Pulse Rate:  [60-85] 60 (12/08 2344) Cardiac Rhythm: Normal sinus rhythm (12/08 1926) Resp:  [16-25] 16 (12/08 2344) BP: (108-146)/(47-63) 108/63 (12/08 2344) SpO2:  [90 %-97 %] 90 % (12/08 2344) Arterial Line BP: (127-167)/(44-126) 167/66 (12/08 1130)  Pre op weight 70.9 kg Current Weight  11/23/20 75.6 kg       Intake/Output from previous day: 12/08 0701 - 12/09 0700 In: 240 [P.O.:240] Out: 2100 [Urine:2100]   Physical Exam:  Cardiovascular: RRR Pulmonary: Slightly diminished bibasilar breath sounds Abdomen: Soft, non tender, bowel sounds present. Patient with ecchymosis left flank (posteriorly)-not tender. Extremities: Mild bilateral lower extremity edema. Wounds: Sternal dressing is clean and dry.  No erythema or signs of infection. Right lower leg wound is clean and dry.  Lab Results: CBC: Recent Labs    11/23/20 0420 11/24/20 0059  WBC 6.5 7.5  HGB 8.4* 8.2*  HCT 24.8* 24.4*  PLT 87* 105*   BMET:  Recent Labs    11/23/20 0420 11/24/20 0059  NA 140 141  K 3.9 3.8  CL 110 109  CO2 22 25  GLUCOSE 118* 101*  BUN 20 25*  CREATININE 0.86 0.96  CALCIUM 8.0* 8.1*    PT/INR:  Lab Results  Component Value Date   INR 1.6 (H) 11/21/2020   INR 1.1 11/20/2020   INR 1.01 10/03/2009   ABG:  INR: Will add last result for INR, ABG once components are confirmed Will add last 4 CBG results once components are confirmed  Assessment/Plan:  1. CV - SR  with HR in the 60's. On Lopressor 12.5 mg bid. 2.  Pulmonary - On room air this am. Will check PA/LAT CXR this am. Chloraseptic spray for sore throat (no evidence of thrush, likely from ETT). Encourage incentive spirometer. 3. Volume Overload - On Lasix 40 mg daily 4.  Expected post op acute blood loss anemia - H and H this am stable at 8.2 and 24.4 5. CBGs 95/100/86. Pre op HGA1C 4.7. Stop accu checks and SS PRN. 6. Supplement potassium 7. Will discuss ecchymotic area left posterior flank. Is on Lovenox. H and H low but stable from yesterday. 8. Likely home 1-2 days Donielle M ZimmermanPA-C 11/24/2020,7:02 AM   Overall doing well. Off supplemental oxygen. Diuresed well yesterday. We will plan for discharge in the next day or 2.  [name]

## 2020-11-24 NOTE — Telephone Encounter (Signed)
Left voice message for patient to call the office  

## 2020-11-24 NOTE — Progress Notes (Signed)
Mobility Specialist: Progress Note   11/24/20 1538  Mobility  Activity Ambulated in hall  Level of Assistance Modified independent, requires aide device or extra time  Assistive Device Front wheel walker  Distance Ambulated (ft) 470 ft  Mobility Response Tolerated well  Mobility performed by Mobility specialist  $Mobility charge 1 Mobility   Pre-Mobility: 67 HR, 120/53 BP, 97% SpO2 Post-Mobility: 70 HR, 138/54 BP, 99% SpO2  Pt was asx during ambulation. Pt hopeful for discharge tomorrow.   [facility] Doyl Bitting Mobility Specialist

## 2020-11-25 ENCOUNTER — Inpatient Hospital Stay (HOSPITAL_COMMUNITY): Payer: Medicare Other

## 2020-11-25 LAB — CBC
HCT: 25.1 % — ABNORMAL LOW (ref 39.0–52.0)
Hemoglobin: 8.9 g/dL — ABNORMAL LOW (ref 13.0–17.0)
MCH: 33.1 pg (ref 26.0–34.0)
MCHC: 35.5 g/dL (ref 30.0–36.0)
MCV: 93.3 fL (ref 80.0–100.0)
Platelets: 140 10*3/uL — ABNORMAL LOW (ref 150–400)
RBC: 2.69 MIL/uL — ABNORMAL LOW (ref 4.22–5.81)
RDW: 13.6 % (ref 11.5–15.5)
WBC: 8.2 10*3/uL (ref 4.0–10.5)
nRBC: 0 % (ref 0.0–0.2)

## 2020-11-25 MED ORDER — ATORVASTATIN CALCIUM 80 MG PO TABS
80.0000 mg | ORAL_TABLET | Freq: Every evening | ORAL | 1 refills | Status: DC
Start: 2020-11-25 — End: 2020-12-19

## 2020-11-25 MED ORDER — LISINOPRIL 5 MG PO TABS
5.0000 mg | ORAL_TABLET | Freq: Every day | ORAL | Status: DC
  Administered 2020-11-25: 5 mg via ORAL
  Filled 2020-11-25: qty 1

## 2020-11-25 MED ORDER — ASPIRIN 325 MG PO TBEC
325.0000 mg | DELAYED_RELEASE_TABLET | Freq: Every day | ORAL | Status: DC
Start: 2020-11-26 — End: 2021-01-17

## 2020-11-25 MED ORDER — TRAMADOL HCL 50 MG PO TABS: 50.0000 mg | ORAL_TABLET | Freq: Four times a day (QID) | ORAL | 0 refills | Status: DC | PRN

## 2020-11-25 MED ORDER — METOPROLOL TARTRATE 25 MG PO TABS: 12.5000 mg | ORAL_TABLET | Freq: Two times a day (BID) | ORAL | 1 refills | Status: DC

## 2020-11-25 MED ORDER — LISINOPRIL 5 MG PO TABS: 5.0000 mg | ORAL_TABLET | Freq: Every day | ORAL | 1 refills | Status: DC

## 2020-11-25 NOTE — Progress Notes (Signed)
Physical Therapy Treatment Patient Details Name: Eddie Fox MRN: [id number] DOB: [birth year] Today's Date: 11/25/2020    History of Present Illness This is a 79 year old male who underwent an elective left heart cath after being worked up for positive stress test. 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 His ejection fraction is preserved and he has no significant valvular disease. On review of his left heart cath he has good targets in his LAD and PDA. His circumflex distribution is small and fills from right to left collaterals distally. 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      PT Comments    Pt seated in recliner.  He remains externally distracted this session and required constant cues for education and safety.  Pt noted not following precautions multiple times during session.  Handout issued via d/c packet for daughter to assist patient in following precautions.  Plan for HHPT at d/c.      Follow Up Recommendations  Home health PT;Supervision/Assistance - 24 hour     Equipment Recommendations  3in1 (PT)    Recommendations for Other Services       Precautions / Restrictions Precautions Precautions: Fall;Sternal Precaution Booklet Issued: Yes (comment) Precaution Comments: Pt poorly follows sternal precautions despite multiple cues and corrections. Restrictions Weight Bearing Restrictions: Yes (sternal precautions)    Mobility  Bed Mobility Overal bed mobility: Needs Assistance Bed Mobility: Sit to Supine       Sit to supine: Supervision   General bed mobility comments: cues needed for technique to maintain sternal precautions as he remains to reach outside the tube despite cues to log roll.  Transfers Overall transfer level: Needs assistance Equipment used: Rolling walker (2 wheeled) Transfers: Sit to/from Stand Sit to Stand: Mod assist         General transfer comment: Mod assistance to push into standing on his knees to move in the tube.  He did standing  impulsively from commode and was pulling with RUE on grab bars despite cues not to.  Ambulation/Gait Ambulation/Gait assistance: Min guard Gait Distance (Feet): 80 Feet Assistive device: Rolling walker (2 wheeled) Gait Pattern/deviations: Step-through pattern;Decreased stride length;Trunk flexed     General Gait Details: Cues for upper trunk control and RW safety.   Stairs Stairs: Yes Stairs assistance: Min guard Stair Management: One rail Right Number of Stairs: 2 General stair comments: Cues for sequencing and to use rail for balance vs. pulling on Rail.   Wheelchair Mobility    Modified Rankin (Stroke Patients Only)       Balance Overall balance assessment: Needs assistance Sitting-balance support: No upper extremity supported;Feet supported Sitting balance-Leahy Scale: Fair       Standing balance-Leahy Scale: Poor Standing balance comment: relies on UE support and external support.                             Cognition Arousal/Alertness: Awake/alert Behavior During Therapy: WFL for tasks assessed/performed Overall Cognitive Status: Within Functional Limits for tasks assessed                                        Exercises Other Exercises Other Exercises: IS x 10 reps.  Pt required cues for technique.  1052ml-1125 ml quality.    General Comments        Pertinent Vitals/Pain Pain Assessment: Faces Faces Pain Scale: Hurts little more Pain  Descriptors / Indicators: Grimacing;Guarding Pain Intervention(s): Monitored during session;Repositioned    Home Living                      Prior Function            PT Goals (current goals can now be found in the care plan section) Acute Rehab PT Goals Patient Stated Goal: to go home Potential to Achieve Goals: Good Progress towards PT goals: Progressing toward goals    Frequency    Min 3X/week      PT Plan Current plan remains appropriate    Co-evaluation               AM-PAC PT "6 Clicks" Mobility   Outcome Measure  Help needed turning from your back to your side while in a flat bed without using bedrails?: A Little Help needed moving from lying on your back to sitting on the side of a flat bed without using bedrails?: A Little Help needed moving to and from a bed to a chair (including a wheelchair)?: A Lot Help needed standing up from a chair using your arms (e.g., wheelchair or bedside chair)?: A Lot Help needed to walk in hospital room?: A Little Help needed climbing 3-5 steps with a railing? : A Little 6 Click Score: 16    End of Session Equipment Utilized During Treatment: Gait belt;Oxygen Activity Tolerance: Patient limited by fatigue Patient left: in bed;with call bell/phone within reach Nurse Communication: Mobility status PT Visit Diagnosis: Muscle weakness (generalized) (M62.81)     Time: 8295-6213 PT Time Calculation (min) (ACUTE ONLY): 21 min  Charges:  $Gait Training: 8-22 mins                     Erasmo Leventhal , PTA Acute Rehabilitation Services Pager [phone] Office [phone]     Yovani Cogburn Eli Hose 11/25/2020, 11:27 AM

## 2020-11-25 NOTE — Progress Notes (Signed)
[address]       [place] [address]             [phone]      4 Days Post-Op Procedure(s) (LRB): CORONARY ARTERY BYPASS GRAFTING (CABG) X 4, USING LEFT INTERNAL MAMMARY ARTERY AND RIGHT LEG GREATER SAPHENOUS VEIN HARVESTED ENDOSCOPICALLY (N/A) TRANSESOPHAGEAL ECHOCARDIOGRAM (TEE) (N/A) Subjective: Feels well, no pain  Objective: Vital signs in last 24 hours: Temp:  [98.7 F (37.1 C)-99 F (37.2 C)] 98.7 F (37.1 C) (12/10 0339) Pulse Rate:  [64-75] 64 (12/10 0339) Cardiac Rhythm: Normal sinus rhythm (12/10 0350) Resp:  [18-20] 18 (12/10 0339) BP: (107-138)/(51-58) 107/54 (12/10 0339) SpO2:  [91 %-100 %] 92 % (12/10 0339) Weight:  [72.3 kg] 72.3 kg (12/10 0546)  Hemodynamic parameters for last 24 hours:    Intake/Output from previous day: 12/09 0701 - 12/10 0700 In: 393 [P.O.:390; I.V.:3] Out: 2000 [Urine:2000] Intake/Output this shift: No intake/output data recorded.  General appearance: alert, cooperative and no distress Heart: regular rate and rhythm Lungs: mildly dim in left base Abdomen: benign Extremities: no edema Wound: incis healing well  Lab Results: Recent Labs    11/24/20 0059 11/25/20 0122  WBC 7.5 8.2  HGB 8.2* 8.9*  HCT 24.4* 25.1*  PLT 105* 140*   BMET:  Recent Labs    11/23/20 0420 11/24/20 0059  NA 140 141  K 3.9 3.8  CL 110 109  CO2 22 25  GLUCOSE 118* 101*  BUN 20 25*  CREATININE 0.86 0.96  CALCIUM 8.0* 8.1*    PT/INR: No results for input(s): LABPROT, INR in the last 72 hours. ABG    Component Value Date/Time   PHART 7.315 (L) 11/21/2020 2247   HCO3 18.7 (L) 11/21/2020 2247   TCO2 20 (L) 11/21/2020 2247   ACIDBASEDEF 7.0 (H) 11/21/2020 2247   O2SAT 96.0 11/21/2020 2247   CBG (last 3)  Recent Labs    11/23/20 2013 11/23/20 2349 11/24/20 0620  GLUCAP 95 100* 86    Meds Scheduled Meds: . acetaminophen  1,000 mg Oral Q6H   Or  . acetaminophen (TYLENOL) oral liquid 160 mg/5 mL  1,000  mg Per Tube Q6H  . aspirin EC  325 mg Oral Daily   Or  . aspirin  324 mg Per Tube Daily  . atorvastatin  80 mg Oral QPM  . bisacodyl  10 mg Oral Daily   Or  . bisacodyl  10 mg Rectal Daily  . chlorhexidine  15 mL Mouth Rinse BID  . Chlorhexidine Gluconate Cloth  6 each Topical Daily  . docusate sodium  200 mg Oral Daily  . enoxaparin (LOVENOX) injection  40 mg Subcutaneous QHS  . furosemide  40 mg Oral Daily  . mouth rinse  15 mL Mouth Rinse q12n4p  . metoprolol tartrate  12.5 mg Oral BID   Or  . metoprolol tartrate  12.5 mg Per Tube BID  . pantoprazole  40 mg Oral Daily  . potassium chloride  20 mEq Oral Daily  . sodium chloride flush  10-40 mL Intracatheter Q12H  . sodium chloride flush  3 mL Intravenous Q12H   Continuous Infusions: . sodium chloride Stopped (11/22/20 1652)  . sodium chloride    . sodium chloride    . sodium chloride    . lactated ringers    . lactated ringers    . lactated ringers 20 mL/hr at 11/22/20 1200  . nitroGLYCERIN Stopped (11/21/20 1624)   PRN  Meds:.sodium chloride, sodium chloride, dextrose, guaiFENesin-dextromethorphan, lactated ringers, metoprolol tartrate, midazolam, morphine injection, ondansetron (ZOFRAN) IV, oxyCODONE, phenol, promethazine, sodium chloride flush, sodium chloride flush, traMADol  Xrays DG Chest 2 View  Result Date: 11/25/2020 CLINICAL DATA:  Pneumothorax.  Cardiac surgery. EXAM: CHEST - 2 VIEW COMPARISON:  11/23/2020. FINDINGS: Interval removal of right IJ line. Interim removal of left chest tube. No pneumothorax. Persistent but improved left base atelectasis/infiltrate and left-sided pleural effusion. Mild right base subsegment atelectasis. Prior CABG. Stable cardiomegaly. No pulmonary venous congestion. Prior lumbar spine fusion. IMPRESSION: 1. Interim removal of left chest tube. No pneumothorax. 2. Persistent but improved left base atelectasis/infiltrate and left-sided pleural effusion. Electronically Signed   By: Marcello Moores   Register   On: 11/25/2020 06:02    Assessment/Plan: S/P Procedure(s) (LRB): CORONARY ARTERY BYPASS GRAFTING (CABG) X 4, USING LEFT INTERNAL MAMMARY ARTERY AND RIGHT LEG GREATER SAPHENOUS VEIN HARVESTED ENDOSCOPICALLY (N/A) TRANSESOPHAGEAL ECHOCARDIOGRAM (TEE) (N/A)  1 afeb, VSS, sinus rhythm 2 sats good on RA 3 H/H stable 4 thrombocytopenia improving trend 5 no leukocytosis 6 BS well controlled 7 CXR- no pntx- infilt/atx/eddus- improved, some cough but clears pretty easily 8 stable for d/c   LOS: 7 days    John Giovanni [facility] Pager [phone] 11/25/2020

## 2020-11-25 NOTE — Progress Notes (Signed)
D/c summary & medications  reviewed by discharge nurse with patient.  Cardiac rehab teaching with family & patient per family report.  CCMD notified.  Assisted by staff to private vehicle.

## 2020-11-25 NOTE — Progress Notes (Signed)
Called patients daughter [facility] and gave her his discharge instructions.  Gave her his sternal precautions and also put documentation for them in his discharge packet.  Hope stated she understood his instructions.

## 2020-11-25 NOTE — Progress Notes (Signed)
Mobility Specialist: Progress Note   11/25/20 1205  Mobility  Activity Ambulated in room  Level of Assistance Minimal assist, patient does 75% or more  Assistive Device Front wheel walker  Distance Ambulated (ft) 10 ft  Mobility Response Tolerated well  Mobility performed by Mobility specialist  Bed Position Chair  $Mobility charge 1 Mobility   Assisted pt in getting dressed for discharge and in transferring from the bed to the chair. Pt is waiting on daughter to arrive for discharge.   [facility] Malene Blaydes Mobility Specialist

## 2020-11-25 NOTE — Progress Notes (Signed)
CARDIAC REHAB PHASE I    Called pt's daughter to go over education with her. She was loading the pt up in her vehicle and asked if we could call at a later time. Will call later to discuss sternal precuations, IS use, restrictions, diet, exercise guidelines and CRP II. Will refer pt to [place] CRP II since he resides in [place].   Rick Duff, MS, CEP 11/25/2020 1:29 PM

## 2020-11-25 NOTE — TOC Transition Note (Signed)
Transition of Care (TOC) - CM/SW Discharge Note Marvetta Gibbons RN, BSN Transitions of Care Unit 4E- RN Case Manager See Treatment Team for direct phone #    Patient Details  Name: Eddie Fox MRN: [id number] Date of Birth: Oct 24, 1941  Transition of Care [facility]) CM/SW Contact:  Dawayne Patricia, RN Phone Number: 11/25/2020, 12:25 PM   Clinical Narrative:    Pt stable for transition home today, [facility] has made a referral to [facility] for any HH needs- they will f/u with pt post discharge- Sharyn Lull with [facility] has been notified of discharge.   Final next level of care: [place] Barriers to Discharge: No Barriers Identified   Patient Goals and CMS Choice Patient states their goals for this hospitalization and ongoing recovery are:: return home      Discharge Placement                 Home with [facility]      Discharge Plan and Services                DME Arranged: N/A DME Agency: NA       HH Arranged: PT HH Agency: [facility] Date [place]: 11/25/20 Time HH Agency Contacted: 1000 Representative spoke with at [place]: [place] Interventions     Readmission Risk Interventions Readmission Risk Prevention Plan 11/25/2020  Transportation Screening Complete  PCP or Specialist Appt within 5-7 Days Complete  Home Care Screening Complete  Medication Review (RN CM) Complete  Some recent data might be hidden

## 2020-11-28 ENCOUNTER — Ambulatory Visit: Payer: Medicare Other | Admitting: Internal Medicine

## 2020-11-28 ENCOUNTER — Telehealth (HOSPITAL_COMMUNITY): Payer: Self-pay | Admitting: *Deleted

## 2020-11-30 ENCOUNTER — Other Ambulatory Visit: Payer: Self-pay | Admitting: Family Medicine

## 2020-11-30 ENCOUNTER — Telehealth (HOSPITAL_COMMUNITY): Payer: Self-pay

## 2020-11-30 DIAGNOSIS — M6281 Muscle weakness (generalized): Secondary | ICD-10-CM | POA: Diagnosis not present

## 2020-11-30 DIAGNOSIS — Z951 Presence of aortocoronary bypass graft: Secondary | ICD-10-CM | POA: Diagnosis not present

## 2020-11-30 DIAGNOSIS — I10 Essential (primary) hypertension: Secondary | ICD-10-CM | POA: Diagnosis not present

## 2020-11-30 DIAGNOSIS — E785 Hyperlipidemia, unspecified: Secondary | ICD-10-CM | POA: Diagnosis not present

## 2020-11-30 DIAGNOSIS — I2510 Atherosclerotic heart disease of native coronary artery without angina pectoris: Secondary | ICD-10-CM | POA: Diagnosis not present

## 2020-11-30 DIAGNOSIS — Z48815 Encounter for surgical aftercare following surgery on the digestive system: Secondary | ICD-10-CM | POA: Diagnosis not present

## 2020-11-30 NOTE — Telephone Encounter (Signed)
Attempted to call patient in regards to Cardiac Rehab - LM on VM 

## 2020-11-30 NOTE — Telephone Encounter (Signed)
Left voice message to call the office  

## 2020-12-01 DIAGNOSIS — I10 Essential (primary) hypertension: Secondary | ICD-10-CM | POA: Diagnosis not present

## 2020-12-01 DIAGNOSIS — M6281 Muscle weakness (generalized): Secondary | ICD-10-CM | POA: Diagnosis not present

## 2020-12-01 DIAGNOSIS — Z48815 Encounter for surgical aftercare following surgery on the digestive system: Secondary | ICD-10-CM | POA: Diagnosis not present

## 2020-12-01 DIAGNOSIS — I2510 Atherosclerotic heart disease of native coronary artery without angina pectoris: Secondary | ICD-10-CM | POA: Diagnosis not present

## 2020-12-01 DIAGNOSIS — Z951 Presence of aortocoronary bypass graft: Secondary | ICD-10-CM | POA: Diagnosis not present

## 2020-12-01 DIAGNOSIS — E785 Hyperlipidemia, unspecified: Secondary | ICD-10-CM | POA: Diagnosis not present

## 2020-12-02 ENCOUNTER — Other Ambulatory Visit: Payer: Self-pay

## 2020-12-02 ENCOUNTER — Ambulatory Visit (INDEPENDENT_AMBULATORY_CARE_PROVIDER_SITE_OTHER): Payer: Self-pay | Admitting: Thoracic Surgery (Cardiothoracic Vascular Surgery)

## 2020-12-02 ENCOUNTER — Encounter: Payer: Self-pay | Admitting: Thoracic Surgery (Cardiothoracic Vascular Surgery)

## 2020-12-02 VITALS — BP 150/64 | HR 55 | Temp 97.6°F | Resp 20 | Ht 70.0 in | Wt 160.0 lb

## 2020-12-02 DIAGNOSIS — Z951 Presence of aortocoronary bypass graft: Secondary | ICD-10-CM

## 2020-12-02 DIAGNOSIS — I2510 Atherosclerotic heart disease of native coronary artery without angina pectoris: Secondary | ICD-10-CM

## 2020-12-02 NOTE — Progress Notes (Signed)
      [address]       [place] [address]             [phone]        Eddie Fox Medical Record #[id number] Date of Birth: 09/12/1941  Referring: Eddie Sine, MD Primary Care: Eddie Loffler, MD Primary Cardiologist:Eddie Stann Mainland, MD  Reason for visit:   follow-up  History of Present Illness:     [name] comes in for his first follow-up appointment.  Overall he is doing quite well.  He is ambulating at home without much difficulty.  He only complains of some left chest wall tenderness and a mildly productive cough.  Physical Exam: BP (!) 150/64   Pulse (!) 55   Temp 97.6 F (36.4 C) (Skin)   Resp 20   Ht 5\' 10"  (1.778 m)   Wt 160 lb (72.6 kg)   SpO2 96% Comment: RA  BMI 22.96 kg/m   Alert NAD Incision clean.  Sternum stable Abdomen soft, ND No peripheral edema      Assessment / Plan:   79 year old male status post CABG.  Currently doing well. We will follow up in 1 month with a chest x-ray.    Lajuana Matte 12/02/2020 10:49 AM

## 2020-12-05 ENCOUNTER — Other Ambulatory Visit: Payer: Self-pay | Admitting: *Deleted

## 2020-12-05 ENCOUNTER — Telehealth: Payer: Self-pay | Admitting: *Deleted

## 2020-12-05 DIAGNOSIS — I10 Essential (primary) hypertension: Secondary | ICD-10-CM | POA: Diagnosis not present

## 2020-12-05 DIAGNOSIS — I2510 Atherosclerotic heart disease of native coronary artery without angina pectoris: Secondary | ICD-10-CM | POA: Diagnosis not present

## 2020-12-05 DIAGNOSIS — E785 Hyperlipidemia, unspecified: Secondary | ICD-10-CM | POA: Diagnosis not present

## 2020-12-05 DIAGNOSIS — Z48815 Encounter for surgical aftercare following surgery on the digestive system: Secondary | ICD-10-CM | POA: Diagnosis not present

## 2020-12-05 DIAGNOSIS — M6281 Muscle weakness (generalized): Secondary | ICD-10-CM | POA: Diagnosis not present

## 2020-12-05 DIAGNOSIS — Z951 Presence of aortocoronary bypass graft: Secondary | ICD-10-CM | POA: Diagnosis not present

## 2020-12-05 MED ORDER — TRAMADOL HCL 50 MG PO TABS: 50.0000 mg | ORAL_TABLET | Freq: Four times a day (QID) | ORAL | 0 refills | Status: AC | PRN

## 2020-12-05 NOTE — Progress Notes (Signed)
Pt called stating he has been taking Lopressor 25mg  tab twice daily instead of 12.5mg  tab twice daily. Instructed pt on taking medication as prescribed. Pt does not c/o any symptoms of low blood pressure at this time. Pt also requesting refill of Tramadol s/p CABG 11/21/20 for ongoing surgical pain. Per Macarthur Critchley, refill sent in to pt's preferred pharmacy, [facility] on [address].

## 2020-12-05 NOTE — Telephone Encounter (Signed)
Pt called requesting refill of Tramadol for pain management. Attempted to call without success. Message left for return call.

## 2020-12-07 ENCOUNTER — Ambulatory Visit: Payer: Medicare Other | Admitting: Physician Assistant

## 2020-12-07 ENCOUNTER — Encounter: Payer: Self-pay | Admitting: Family Medicine

## 2020-12-07 DIAGNOSIS — I2510 Atherosclerotic heart disease of native coronary artery without angina pectoris: Secondary | ICD-10-CM | POA: Diagnosis not present

## 2020-12-07 DIAGNOSIS — I10 Essential (primary) hypertension: Secondary | ICD-10-CM | POA: Diagnosis not present

## 2020-12-07 DIAGNOSIS — E785 Hyperlipidemia, unspecified: Secondary | ICD-10-CM | POA: Diagnosis not present

## 2020-12-07 DIAGNOSIS — Z951 Presence of aortocoronary bypass graft: Secondary | ICD-10-CM | POA: Diagnosis not present

## 2020-12-07 DIAGNOSIS — Z48815 Encounter for surgical aftercare following surgery on the digestive system: Secondary | ICD-10-CM | POA: Diagnosis not present

## 2020-12-07 DIAGNOSIS — M6281 Muscle weakness (generalized): Secondary | ICD-10-CM | POA: Diagnosis not present

## 2020-12-07 NOTE — Telephone Encounter (Signed)
Letter sent to pt

## 2020-12-08 ENCOUNTER — Other Ambulatory Visit: Payer: Medicare Other

## 2020-12-14 ENCOUNTER — Encounter: Payer: Medicare Other | Admitting: Family Medicine

## 2020-12-14 ENCOUNTER — Telehealth: Payer: Self-pay | Admitting: Family Medicine

## 2020-12-14 NOTE — Telephone Encounter (Signed)
Pt called in wanted to know about being seen in office and he has a runny nose and cough and I did inform him that it would have virtual but wanted me ask Dr. Patsy Lager.

## 2020-12-14 NOTE — Telephone Encounter (Signed)
It has to be virtual.  We are not seeing sick patients in the office.  I am completely full tomorrow.  If anyone else can see him I would appreciate that.

## 2020-12-15 DIAGNOSIS — Z951 Presence of aortocoronary bypass graft: Secondary | ICD-10-CM | POA: Diagnosis not present

## 2020-12-15 DIAGNOSIS — I10 Essential (primary) hypertension: Secondary | ICD-10-CM | POA: Diagnosis not present

## 2020-12-15 DIAGNOSIS — E785 Hyperlipidemia, unspecified: Secondary | ICD-10-CM | POA: Diagnosis not present

## 2020-12-15 DIAGNOSIS — Z48815 Encounter for surgical aftercare following surgery on the digestive system: Secondary | ICD-10-CM | POA: Diagnosis not present

## 2020-12-15 DIAGNOSIS — I2510 Atherosclerotic heart disease of native coronary artery without angina pectoris: Secondary | ICD-10-CM | POA: Diagnosis not present

## 2020-12-15 DIAGNOSIS — M6281 Muscle weakness (generalized): Secondary | ICD-10-CM | POA: Diagnosis not present

## 2020-12-18 ENCOUNTER — Other Ambulatory Visit: Payer: Self-pay | Admitting: Surgical

## 2020-12-19 ENCOUNTER — Telehealth (INDEPENDENT_AMBULATORY_CARE_PROVIDER_SITE_OTHER): Payer: Medicare Other | Admitting: Family Medicine

## 2020-12-19 ENCOUNTER — Encounter: Payer: Self-pay | Admitting: Family Medicine

## 2020-12-19 ENCOUNTER — Other Ambulatory Visit: Payer: Self-pay | Admitting: Family Medicine

## 2020-12-19 VITALS — Ht 70.0 in

## 2020-12-19 DIAGNOSIS — R059 Cough, unspecified: Secondary | ICD-10-CM | POA: Diagnosis not present

## 2020-12-19 DIAGNOSIS — R432 Parageusia: Secondary | ICD-10-CM

## 2020-12-19 DIAGNOSIS — Z20822 Contact with and (suspected) exposure to covid-19: Secondary | ICD-10-CM

## 2020-12-19 DIAGNOSIS — R0989 Other specified symptoms and signs involving the circulatory and respiratory systems: Secondary | ICD-10-CM | POA: Diagnosis not present

## 2020-12-19 MED ORDER — GUAIFENESIN-CODEINE 100-10 MG/5ML PO SOLN: 5.0000 mL | Freq: Four times a day (QID) | ORAL | 0 refills | Status: AC | PRN

## 2020-12-19 MED ORDER — IPRATROPIUM BROMIDE 0.06 % NA SOLN: 2.0000 | Freq: Four times a day (QID) | NASAL | 0 refills | Status: DC

## 2020-12-19 NOTE — Progress Notes (Signed)
Quintara Bost T. Alexandria Shiflett, MD Primary Care and Sports Medicine [facility] [address] [place], [address] Phone: [phone]  FAX: [phone]  COLEMAN KALAS - 80 y.o. male  MRN [id number]  Date of Birth: Feb 02, 1941  Visit Date: 12/19/2020  PCP: Hannah Beat, MD  Referred by: Hannah Beat, MD  Virtual Visit via Telephone Note:  I connected with  Tomasa Blase on 12/19/2020 12:00 PM EST by telephone and verified that I am speaking with the correct person using two identifiers.   Location patient: home phone or cell phone Location provider: work or home office Consent: Verbal consent directly obtained from Tomasa Blase and that there may be a patient responsible charge related to this service. Persons participating in the virtual visit: patient, provider  I discussed the limitations of evaluation and management by telemedicine and the availability of in person appointments.  The patient expressed understanding and agreed to proceed.     Chief Complaint  Patient presents with  . Cough  . Nasal Congestion     History of Present Illness:  Has had a runny nose since he got out of the hospital  Did go out with his daughter only once  S/p CABG, early 11/2020  Taste is off and has been for a while Runny nose Cough Diarrhea today No fever  Has been around daughter, grandkids, does not think that anyone has been sick.  Wanted to know what he could take to help and based on his problems and meds.  Immunization History  Administered Date(s) Administered  . Influenza Split 12/25/2011  . Influenza, High Dose Seasonal PF 08/31/2020  . Influenza,inj,Quad PF,6+ Mos 08/19/2013, 08/26/2014, 09/14/2015, 09/11/2016, 08/28/2017, 08/27/2018, 08/10/2019  . PFIZER SARS-COV-2 Vaccination 01/06/2020, 01/27/2020  . Pneumococcal Conjugate-13 02/16/2015  . Td 03/24/2002, 12/22/2009     Review of Systems: pertinent positives and  pertinent negatives as per HPI No acute distress verbally   Observations/Objective/Exam:  An attempt was made to discern vital signs over the phone and per patient if applicable and possible.   Neurological:     Mental Status: pleasant and appropriate   Psychiatric:        Thought Content: Thought content normal.      Assessment and Plan:    ICD-10-CM   1. Suspected COVID-19 virus infection  Z20.822   2. Cough  R05.9   3. Taste impairment  R43.2    Total encounter time: 20 minutes. This includes total time spent on the day of encounter.  This includes review of prior hospitalization notes, labs, and other data.  It sounds as if he has had some runny nose for a while, but I worry since he has had some more recent change of having some diarrhea as well as having some change in his taste that he could have COVID-19.  She also coughing quite a bit that is keeping him up at night, coughing and he will get some pains when he coughs, predominantly at the area of his prior open heart surgery incision.  For symptomatic care I am going to give him some Robitussin-AC and some nasal Atrovent.  I discussed the assessment and treatment plan with the patient. The patient was provided an opportunity to ask questions and all were answered. The patient agreed with the plan and demonstrated an understanding of the instructions.   The patient was advised to call back or seek an in-person evaluation if the symptoms worsen or if  the condition fails to improve as anticipated.  Follow-up: prn unless noted otherwise below No follow-ups on file.  Meds ordered this encounter  Medications  . guaiFENesin-codeine 100-10 MG/5ML syrup    Sig: Take 5 mLs by mouth every 6 (six) hours as needed for up to 5 days for cough.    Dispense:  100 mL    Refill:  0  . ipratropium (ATROVENT) 0.06 % nasal spray    Sig: Place 2 sprays into both nostrils 4 (four) times daily. Prn runny nose    Dispense:  15 mL     Refill:  0   No orders of the defined types were placed in this encounter.   Signed,  Maud Deed. Signora Zucco, MD

## 2020-12-19 NOTE — Progress Notes (Signed)
Appointment scheduled for 12/20/2020 at 2:30 pm.  Back door instructions provided.

## 2020-12-20 ENCOUNTER — Other Ambulatory Visit: Payer: Medicare Other

## 2020-12-20 DIAGNOSIS — Z20822 Contact with and (suspected) exposure to covid-19: Secondary | ICD-10-CM

## 2020-12-20 DIAGNOSIS — R0989 Other specified symptoms and signs involving the circulatory and respiratory systems: Secondary | ICD-10-CM

## 2020-12-20 DIAGNOSIS — R432 Parageusia: Secondary | ICD-10-CM | POA: Diagnosis not present

## 2020-12-20 DIAGNOSIS — R059 Cough, unspecified: Secondary | ICD-10-CM | POA: Diagnosis not present

## 2020-12-22 LAB — SARS-COV-2, NAA 2 DAY TAT

## 2020-12-22 LAB — NOVEL CORONAVIRUS, NAA: SARS-CoV-2, NAA: NOT DETECTED

## 2021-01-03 ENCOUNTER — Ambulatory Visit: Payer: Medicare Other | Admitting: Physician Assistant

## 2021-01-06 ENCOUNTER — Other Ambulatory Visit: Payer: Self-pay | Admitting: Family Medicine

## 2021-01-06 ENCOUNTER — Ambulatory Visit: Payer: Self-pay | Admitting: Thoracic Surgery (Cardiothoracic Vascular Surgery)

## 2021-01-06 DIAGNOSIS — C181 Malignant neoplasm of appendix: Secondary | ICD-10-CM

## 2021-01-10 ENCOUNTER — Other Ambulatory Visit: Payer: Self-pay | Admitting: Thoracic Surgery (Cardiothoracic Vascular Surgery)

## 2021-01-10 ENCOUNTER — Other Ambulatory Visit: Payer: Self-pay | Admitting: Family Medicine

## 2021-01-12 ENCOUNTER — Other Ambulatory Visit: Payer: Self-pay | Admitting: Family Medicine

## 2021-01-12 ENCOUNTER — Other Ambulatory Visit: Payer: Self-pay | Admitting: Thoracic Surgery (Cardiothoracic Vascular Surgery)

## 2021-01-12 DIAGNOSIS — Z951 Presence of aortocoronary bypass graft: Secondary | ICD-10-CM

## 2021-01-13 ENCOUNTER — Encounter: Payer: Self-pay | Admitting: Thoracic Surgery (Cardiothoracic Vascular Surgery)

## 2021-01-13 ENCOUNTER — Ambulatory Visit: Payer: Medicare Other | Admitting: Medical

## 2021-01-13 ENCOUNTER — Ambulatory Visit: Payer: Medicare Other | Admitting: Internal Medicine

## 2021-01-13 ENCOUNTER — Ambulatory Visit (INDEPENDENT_AMBULATORY_CARE_PROVIDER_SITE_OTHER): Payer: Self-pay | Admitting: Thoracic Surgery (Cardiothoracic Vascular Surgery)

## 2021-01-13 ENCOUNTER — Ambulatory Visit
Admission: RE | Admit: 2021-01-13 | Discharge: 2021-01-13 | Disposition: A | Discharge status: Home / Self Care | Payer: Medicare Other | Source: Ambulatory Visit | Attending: Thoracic Surgery (Cardiothoracic Vascular Surgery) | Admitting: Thoracic Surgery (Cardiothoracic Vascular Surgery)

## 2021-01-13 ENCOUNTER — Other Ambulatory Visit: Payer: Self-pay

## 2021-01-13 ENCOUNTER — Encounter: Payer: Self-pay | Admitting: Internal Medicine

## 2021-01-13 VITALS — BP 153/60 | HR 60 | Ht 70.0 in | Wt 151.8 lb

## 2021-01-13 VITALS — BP 153/60 | HR 55 | Temp 97.6°F | Resp 20 | Ht 70.0 in | Wt 152.0 lb

## 2021-01-13 DIAGNOSIS — I10 Essential (primary) hypertension: Secondary | ICD-10-CM

## 2021-01-13 DIAGNOSIS — R9439 Abnormal result of other cardiovascular function study: Secondary | ICD-10-CM | POA: Diagnosis not present

## 2021-01-13 DIAGNOSIS — Z951 Presence of aortocoronary bypass graft: Secondary | ICD-10-CM

## 2021-01-13 DIAGNOSIS — E7800 Pure hypercholesterolemia, unspecified: Secondary | ICD-10-CM | POA: Diagnosis not present

## 2021-01-13 DIAGNOSIS — Z9889 Other specified postprocedural states: Secondary | ICD-10-CM | POA: Diagnosis not present

## 2021-01-13 DIAGNOSIS — I2510 Atherosclerotic heart disease of native coronary artery without angina pectoris: Secondary | ICD-10-CM

## 2021-01-13 DIAGNOSIS — R072 Precordial pain: Secondary | ICD-10-CM

## 2021-01-13 DIAGNOSIS — I493 Ventricular premature depolarization: Secondary | ICD-10-CM

## 2021-01-13 DIAGNOSIS — Z79899 Other long term (current) drug therapy: Secondary | ICD-10-CM

## 2021-01-13 DIAGNOSIS — I700 Atherosclerosis of aorta: Secondary | ICD-10-CM | POA: Diagnosis not present

## 2021-01-13 DIAGNOSIS — I25118 Atherosclerotic heart disease of native coronary artery with other forms of angina pectoris: Secondary | ICD-10-CM | POA: Diagnosis not present

## 2021-01-13 DIAGNOSIS — J90 Pleural effusion, not elsewhere classified: Secondary | ICD-10-CM | POA: Diagnosis not present

## 2021-01-13 DIAGNOSIS — I25810 Atherosclerosis of coronary artery bypass graft(s) without angina pectoris: Secondary | ICD-10-CM | POA: Diagnosis not present

## 2021-01-13 NOTE — Patient Instructions (Signed)
Medication Instructions:  WE WILL CALL YOU WITH CHANGES AFTER VISIT WITH SURGEON  *If you need a refill on your cardiac medications before your next appointment, please call your pharmacy*  Follow-Up: At [facility], you and your health needs are our priority.  As part of our continuing mission to provide you with exceptional heart care, we have created designated Provider Care Teams.  These Care Teams include your primary Cardiologist (physician) and Advanced Practice Providers (APPs -  Physician Assistants and Nurse Practitioners) who all work together to provide you with the care you need, when you need it.  We recommend signing up for the patient portal called "MyChart".  Sign up information is provided on this After Visit Summary.  MyChart is used to connect with patients for Virtual Visits (Telemedicine).  Patients are able to view lab/test results, encounter notes, upcoming appointments, etc.  Non-urgent messages can be sent to your provider as well.   To learn more about what you can do with MyChart, go to [email or web address].    Your next appointment:   3 month(s)  The format for your next appointment:   In Person  Provider:   Cherlynn Kaiser, MD

## 2021-01-13 NOTE — Progress Notes (Signed)
      [address]       [place] [address]             [phone]        Yosmar T Duley [place] Medical Record #[id number] Date of Birth: [birth year]  Referring: Troy Sine, MD Primary Care: Owens Loffler, MD Primary Cardiologist:Gayatri Stann Mainland, MD  Reason for visit:   follow-up  History of Present Illness:     Mr. Saiz presents for his 1 month follow-up appointment.  He is doing well.  He is able to walk 1 mile without any symptoms.  Physical Exam: BP (!) 153/60   Pulse (!) 55   Temp 97.6 F (36.4 C) (Skin)   Resp 20   Ht 5\' 10"  (1.778 m)   Wt 152 lb (68.9 kg)   SpO2 98% Comment: RA with mask on  BMI 21.81 kg/m   Alert NAD Incision clean, there is a small pimple at the upper portion of his incision which likely is a stitch.  There is no erythema or fluctuance.  Sternum stable Abdomen soft, ND No peripheral edema   Diagnostic Studies & Laboratory data: CXR: Small left effusion     Assessment / Plan:   80 year old male status post CABG doing well he does have a very small effusion on the left side but he denies any respiratory symptoms.  He was seen by his cardiologist today and his medications were increased due to his high blood pressure.  He will follow-up as needed.   Lajuana Matte 01/13/2021 4:58 PM

## 2021-01-13 NOTE — Telephone Encounter (Signed)
Last office visit 12/19/2020 for cough/nasal congestion.  Last Lipid Panel 11/23/2019.  No future appointments with PCP.  Ok to refill?

## 2021-01-13 NOTE — Progress Notes (Signed)
Cardiology Office Note:    Date:  01/13/2021   ID:  Eddie Fox, DOB 06-21-1941, MRN [id number]  PCP:  Owens Loffler, MD  Cardiologist:  Elouise Munroe, MD  Electrophysiologist:  None   Referring MD: Owens Loffler, MD   Chief Complaint/Reason for Referral: S/p CABG  History of Present Illness:    Eddie Fox is a 80 y.o. male with a history of HLD, NASH, HTN who presents after CABG x 4 on 11/21/20 for chest pain and abnormal stress test.   Graft summary: LIMA-LAD SVG-D1 SVG-OM SVG-PDA  He's feeling well overall and healing appropriately. The patient denies chest pain, chest pressure, dyspnea at rest or with exertion, palpitations, PND, orthopnea, or leg swelling. Denies cough, fever, chills. Denies nausea, vomiting. Denies syncope or presyncope. Denies dizziness or lightheadedness.    Past Medical History:  Diagnosis Date   Benign neoplasm of colon    ED (erectile dysfunction)    IBS (irritable bowel syndrome)    Melanoma (HCC)    history of   Other and unspecified hyperlipidemia    Other chronic nonalcoholic liver disease    PONV (postoperative nausea and vomiting)    Unspecified essential hypertension    Unspecified vitamin D deficiency     Past Surgical History:  Procedure Laterality Date   AMPUTATION     distal phalanx of right thumb- traumatic   CHOLECYSTECTOMY  10/03/09   lap   CORONARY ARTERY BYPASS GRAFT N/A 11/21/2020   Procedure: CORONARY ARTERY BYPASS GRAFTING (CABG) X 4, USING LEFT INTERNAL MAMMARY ARTERY AND RIGHT LEG GREATER SAPHENOUS VEIN HARVESTED ENDOSCOPICALLY;  Surgeon: Lajuana Matte, MD;  Location: [facility];  Service: Open Heart Surgery;  Laterality: N/A;   LAMINECTOMY  1995   L 3-T11 Harrington rods in back   LAPAROSCOPIC APPENDECTOMY N/A 05/16/2016   Procedure: APPENDECTOMY LAPAROSCOPIC;  Surgeon: Greer Pickerel, MD;  Location: [facility];  Service: General;  Laterality: N/A;   LEFT HEART CATH AND CORONARY ANGIOGRAPHY N/A  11/18/2020   Procedure: LEFT HEART CATH AND CORONARY ANGIOGRAPHY;  Surgeon: Troy Sine, MD;  Location: [facility];  Service: Cardiovascular;  Laterality: N/A;   MELANOMA EXCISION  1995   right upper abd, Dr. Donita Brooks HEAD EXCISION Right 06/23/2020   Procedure: RIGHT 5TH METATARSAL OSTEOTOMY WITH BUNIONETTE EXCISION, OPEN REDUCTION OF FIFTH METATARSAL PHALANGEAL JOINT, FIFTH METATARSAL PHALANGEAL CAPSULOTOMY, BURSECTOMY;  Surgeon: Erle Crocker, MD;  Location: [facility];  Service: Orthopedics;  Laterality: Right;  LENGTH OF SURGERY: 1.5 HOURS   ORIF ANKLE FRACTURE  1967   TEE WITHOUT CARDIOVERSION N/A 11/21/2020   Procedure: TRANSESOPHAGEAL ECHOCARDIOGRAM (TEE);  Surgeon: Lajuana Matte, MD;  Location: [facility];  Service: Open Heart Surgery;  Laterality: N/A;    Current Medications: Current Meds  Medication Sig   acetaminophen (TYLENOL) 500 MG tablet Take 1,000 mg by mouth every 6 (six) hours as needed for mild pain or headache.   aspirin EC 325 MG EC tablet Take 1 tablet (325 mg total) by mouth daily.   atorvastatin (LIPITOR) 80 MG tablet TAKE 1 TABLET BY MOUTH EVERY EVENING   fluticasone (FLONASE) 50 MCG/ACT nasal spray USE 2 SPRAYS IN BOTH  NOSTRILS ONCE DAILY AS  NEEDED   lisinopril (ZESTRIL) 5 MG tablet TAKE 1 TABLET (5 MG TOTAL) BY MOUTH DAILY.   metoprolol tartrate (LOPRESSOR) 25 MG tablet TAKE 0.5 TABLETS BY MOUTH 2 TIMES DAILY.     Allergies:   Hydrocodone and  Pneumococcal vaccine   Social History   Tobacco Use   Smoking status: Never Smoker   Smokeless tobacco: Never Used  Vaping Use   Vaping Use: Never used  Substance Use Topics   Alcohol use: No   Drug use: No     Family History: The patient's family history includes Diabetes in his mother; Heart disease in his father and mother; Hypertension in his father, mother, sister, and sister; Stroke in his mother. There is no history of Depression, Alcohol abuse, or Drug  abuse.  ROS:   Please see the history of present illness.    All other systems reviewed and are negative.  EKGs/Labs/Other Studies Reviewed:    The following studies were reviewed today:  EKG:  SR, freq PVCs   Recent Labs: 09/26/2020: TSH 2.98 11/21/2020: ALT 11 11/22/2020: Magnesium 2.5 11/24/2020: BUN 25; Creatinine, Ser 0.96; Potassium 3.8; Sodium 141 11/25/2020: Hemoglobin 8.9; Platelets 140  Recent Lipid Panel    Component Value Date/Time   CHOL 144 11/23/2019 1151   TRIG 105.0 11/23/2019 1151   HDL 41.00 11/23/2019 1151   CHOLHDL 4 11/23/2019 1151   VLDL 21.0 11/23/2019 1151   LDLCALC 82 11/23/2019 1151   LDLDIRECT 171.9 07/08/2007 1044    Physical Exam:    VS:  BP (!) 153/60 (BP Location: Left Arm, Patient Position: Sitting)    Pulse 60    Ht 5\' 10"  (1.778 m)    Wt 151 lb 12.8 oz (68.9 kg)    SpO2 97%    BMI 21.78 kg/m     Wt Readings from Last 5 Encounters:  01/13/21 151 lb 12.8 oz (68.9 kg)  12/02/20 160 lb (72.6 kg)  11/25/20 159 lb 6.3 oz (72.3 kg)  10/21/20 163 lb 3.2 oz (74 kg)  10/19/20 165 lb (74.8 kg)    Constitutional: No acute distress Eyes: sclera non-icteric, normal conjunctiva and lids ENMT: normal dentition, moist mucous membranes Cardiovascular: regular rhythm, normal rate, no murmurs. S1 and S2 normal. Radial pulses normal bilaterally. No jugular venous distention.  Respiratory: clear to auscultation bilaterally GI : normal bowel sounds, soft and nontender. No distention.   MSK: extremities warm, well perfused. No edema.  NEURO: grossly nonfocal exam, moves all extremities. PSYCH: alert and oriented x 3, normal mood and affect.   ASSESSMENT:    1. S/P CABG x 4   2. Coronary artery disease of native artery of native heart with stable angina pectoris ([place])   3. Abnormal cardiovascular stress test   4. Precordial pain   5. Essential hypertension   6. Pure hypercholesterolemia   7. Medication management   8. PVC's (premature ventricular  contractions)    PLAN:    S/P CABG x 4 - Plan: EKG 12-Lead Abnormal cardiovascular stress test Precordial pain - will plan to decrease ASA dose after visit with Dr. Kipp Brood today. We will be in touch with patient.   PVCs Essential hypertension - BP elevated, would recommend increasing dose of lisinopril to 10 mg daily. Amlodipine stopped at hospital discharge. Also on low dose BB, with frequent ectopy and HTN can also consider increase in metoprolol. We will await final recommendations from [facility] prior to making medication changes.   Pure hypercholesterolemia - continue atorvastatin 80 mg daily.  Medication management - will plan to increase metoprolol and lisinopril, and decrease dose of aspirin pending surgical follow up visit.   Total time of encounter: 30 minutes total time of encounter, including 20 minutes spent in face-to-face patient care on  the date of this encounter. This time includes coordination of care and counseling regarding above mentioned problem list. Remainder of non-face-to-face time involved reviewing chart documents/testing relevant to the patient encounter and documentation in the medical record. I have independently reviewed documentation from referring provider.   Cherlynn Kaiser, MD [place]   [facility]    Medication Adjustments/Labs and Tests Ordered: Current medicines are reviewed at length with the patient today.  Concerns regarding medicines are outlined above.   Orders Placed This Encounter  Procedures   EKG 12-Lead    Shared Decision Making/Informed Consent:       No orders of the defined types were placed in this encounter.   Patient Instructions  Medication Instructions:  WE WILL CALL YOU WITH CHANGES AFTER VISIT WITH SURGEON  *If you need a refill on your cardiac medications before your next appointment, please call your pharmacy*  Follow-Up: At [facility], you and your health needs are our priority.  As part of our  continuing mission to provide you with exceptional heart care, we have created designated Provider Care Teams.  These Care Teams include your primary Cardiologist (physician) and Advanced Practice Providers (APPs -  Physician Assistants and Nurse Practitioners) who all work together to provide you with the care you need, when you need it.  We recommend signing up for the patient portal called "MyChart".  Sign up information is provided on this After Visit Summary.  MyChart is used to connect with patients for Virtual Visits (Telemedicine).  Patients are able to view lab/test results, encounter notes, upcoming appointments, etc.  Non-urgent messages can be sent to your provider as well.   To learn more about what you can do with MyChart, go to [email or web address].    Your next appointment:   3 month(s)  The format for your next appointment:   In Person  Provider:   Cherlynn Kaiser, MD

## 2021-01-16 ENCOUNTER — Other Ambulatory Visit: Payer: Self-pay | Admitting: *Deleted

## 2021-01-17 ENCOUNTER — Telehealth: Payer: Self-pay | Admitting: Internal Medicine

## 2021-01-17 MED ORDER — METOPROLOL TARTRATE 25 MG PO TABS: 12.5000 mg | ORAL_TABLET | Freq: Two times a day (BID) | ORAL | 3 refills | Status: DC

## 2021-01-17 MED ORDER — LISINOPRIL 10 MG PO TABS: 10.0000 mg | ORAL_TABLET | Freq: Every day | ORAL | 3 refills | Status: DC

## 2021-01-17 MED ORDER — ASPIRIN EC 81 MG PO TBEC: 81.0000 mg | DELAYED_RELEASE_TABLET | Freq: Every day | ORAL | Status: AC

## 2021-01-17 NOTE — Addendum Note (Signed)
Addended by: Rexanne Mano B on: 01/17/2021 04:35 PM   Modules accepted: Orders

## 2021-01-17 NOTE — Addendum Note (Signed)
Addended by: Rexanne Mano B on: 01/17/2021 05:06 PM   Modules accepted: Orders

## 2021-01-17 NOTE — Telephone Encounter (Signed)
Returned call to patient, patient states that he wanted to see if  Dr. Margaretann Loveless was going to increase his Lisinopril after last office visit.   Patient currently taking Lisinopril 5mg  Daily & Metoprolol Tartrate 12.5mg  BID.   Advised patient I would forward message to Dr. Margaretann Loveless to see if she would like to make any changes. Patient verbalized understanding.

## 2021-01-17 NOTE — Telephone Encounter (Signed)
Please increase lisinopril to 10 mg daily.  Please continue metoprolol tartrate 12.5 mg BID. Please decrease aspirin dose to 81 mg daily.

## 2021-01-17 NOTE — Telephone Encounter (Signed)
Returned call to patient. Patient made aware of Dr. Delphina Cahill recommendations. Patient will decrease ASA to 81mg . New prescriptions sent in to patient preferred pharmacy. Patient made aware and verbalized understanding.

## 2021-01-17 NOTE — Telephone Encounter (Signed)
Pt c/o medication issue:  1. Name of Medication: lisinopril (ZESTRIL) 5 MG tablet    2. How are you currently taking this medication (dosage and times per day)? As prescribed.  3. Are you having a reaction (difficulty breathing--STAT)? No.  4. What is your medication issue? Patient is calling stating that Dr. Margaretann Loveless told him on his most recent appt with her that she would change this medication to 10 mg and when he received it today it was still 5 mg. Please advise.

## 2021-01-23 ENCOUNTER — Encounter: Payer: Self-pay | Admitting: Family Medicine

## 2021-01-23 ENCOUNTER — Encounter (HOSPITAL_COMMUNITY): Payer: Self-pay

## 2021-01-23 ENCOUNTER — Other Ambulatory Visit: Payer: Self-pay

## 2021-01-23 ENCOUNTER — Telehealth (HOSPITAL_COMMUNITY): Payer: Self-pay

## 2021-01-23 ENCOUNTER — Ambulatory Visit (INDEPENDENT_AMBULATORY_CARE_PROVIDER_SITE_OTHER): Payer: Medicare Other | Admitting: Family Medicine

## 2021-01-23 VITALS — BP 152/72 | HR 40 | Temp 97.9°F | Ht 70.0 in | Wt 153.8 lb

## 2021-01-23 DIAGNOSIS — M76821 Posterior tibial tendinitis, right leg: Secondary | ICD-10-CM

## 2021-01-23 DIAGNOSIS — M19012 Primary osteoarthritis, left shoulder: Secondary | ICD-10-CM

## 2021-01-23 MED ORDER — TRIAMCINOLONE ACETONIDE 40 MG/ML IJ SUSP
40.0000 mg | Freq: Once | INTRAMUSCULAR | Status: AC
  Administered 2021-01-23: 40 mg via INTRA_ARTICULAR

## 2021-01-23 MED ORDER — PANTOPRAZOLE SODIUM 40 MG PO TBEC: 40.0000 mg | DELAYED_RELEASE_TABLET | Freq: Every day | ORAL | 3 refills | Status: DC

## 2021-01-23 MED ORDER — LISINOPRIL 20 MG PO TABS
20.0000 mg | ORAL_TABLET | Freq: Every day | ORAL | 3 refills | Status: DC
Start: 2021-01-23 — End: 2021-12-05

## 2021-01-23 NOTE — Progress Notes (Signed)
Eddie Peed T. Masen Luallen, MD, [place] [facility] [place], [address]  Phone: [phone]  FAX: [phone]  Eddie Fox - 80 y.o. male  MRN [id number]  Date of Birth: [birth year]  Date: 01/23/2021  PCP: Owens Loffler, MD  Referral: Owens Loffler, MD  Chief Complaint  Patient presents with  . Shoulder Pain    Left  . Foot Pain    Right    This visit occurred during the SARS-CoV-2 public health emergency.  Safety protocols were in place, including screening questions prior to the visit, additional usage of staff PPE, and extensive cleaning of exam room while observing appropriate contact time as indicated for disinfecting solutions.   Subjective:   Eddie Fox is a 80 y.o. very pleasant male patient with Body mass index is 22.06 kg/m. who presents with the following:  L GH OA: He has severe crepitus of bilateral shoulders, and he has responded well to some intra-articular injections in the past.  Last time I did do a left-sided intra-articular injection was more than 3 years ago, and he had a longstanding relief of symptoms.  He did recently have CABG.  Right foot: He did fairly recently have a large-scale bunionette surgery done by podiatry.  He does now have some lateral foot pain on the left.  inj l shoulder  BP Readings from Last 3 Encounters:  01/23/21 (!) 152/72  01/13/21 (!) 153/60  01/13/21 (!) 153/60    Review of Systems is noted in the HPI, as appropriate   Objective:   BP (!) 152/72   Pulse (!) 40   Temp 97.9 F (36.6 C) (Temporal)   Ht 5\' 10"  (1.778 m)   Wt 153 lb 12 oz (69.7 kg)   SpO2 98%   BMI 22.06 kg/m   Left shoulder: He does lack some motion and he has obvious loud crepitus and grinding to the ear as well as with hands late on the shoulder.  His strength is approximately 4+/5 on the left.  He is neurovascularly intact.  Right  foot: He does have a obvious postsurgical scars around the fifth and fourth MTP joints metatarsals.  He does have some tenderness just proximal to the base of the fifth metatarsal he also seems to have some midfoot pain predominantly, and he points that this is the major area of tenderness.  Radiology:   Assessment and Plan:     ICD-10-CM   1. Primary osteoarthritis of left shoulder  M19.012 triamcinolone acetonide (KENALOG-40) injection 40 mg  2. Tibialis posterior tendinitis, right  M76.821    He had an excellent resolution of symptoms for years with the last shoulder injection I did, I think that this is entirely reasonable.  I would like not to do anything at all with his posterior tibialis tendon, but I did give him some classic PT rehab.  Also refilled his blood pressure and GERD medication.  Intraarticular Shoulder Aspiration/Injection Procedure Note Eddie Fox 1940-12-25 Date of procedure: 01/23/2021  Procedure: Large Joint Aspiration / Injection of Shoulder, Intraarticular, L Indications: Pain  Procedure Details Verbal consent was obtained from the patient. Risks explained and contrasted with benefits and alternatives. Patient prepped with Chloraprep and Ethyl Chloride used for anesthesia. An intraarticular shoulder injection was performed using the posterior approach; needle placed into joint capsule without difficulty. The patient tolerated the procedure well and had decreased pain post injection. No complications.  Injection: 9 cc of Lidocaine 1% and 1 mL Kenalog 40 mg. Needle: 21 gauge, 2 inch  Meds ordered this encounter  Medications  . pantoprazole (PROTONIX) 40 MG tablet    Sig: Take 1 tablet (40 mg total) by mouth daily.    Dispense:  90 tablet    Refill:  3  . triamcinolone acetonide (KENALOG-40) injection 40 mg  . lisinopril (ZESTRIL) 20 MG tablet    Sig: Take 1 tablet (20 mg total) by mouth daily.    Dispense:  90 tablet    Refill:  3   Medications  Discontinued During This Encounter  Medication Reason  . lisinopril (ZESTRIL) 10 MG tablet    No orders of the defined types were placed in this encounter.   Follow-up: No follow-ups on file.  Signed,  Maud Deed. Tully Mcinturff, MD   Outpatient Encounter Medications as of 01/23/2021  Medication Sig  . acetaminophen (TYLENOL) 500 MG tablet Take 1,000 mg by mouth every 6 (six) hours as needed for mild pain or headache.  Marland Kitchen aspirin 81 MG tablet Take 1 tablet (81 mg total) by mouth daily.  Marland Kitchen atorvastatin (LIPITOR) 80 MG tablet TAKE 1 TABLET BY MOUTH EVERY EVENING  . fluticasone (FLONASE) 50 MCG/ACT nasal spray USE 2 SPRAYS IN BOTH  NOSTRILS ONCE DAILY AS  NEEDED  . ipratropium (ATROVENT) 0.06 % nasal spray PLACE 2 SPRAYS INTO BOTH NOSTRILS 4 (FOUR) TIMES DAILY AS NEEDED FOR RUNNY NOSE  . lisinopril (ZESTRIL) 20 MG tablet Take 1 tablet (20 mg total) by mouth daily.  . metoprolol tartrate (LOPRESSOR) 25 MG tablet Take 0.5 tablets (12.5 mg total) by mouth 2 (two) times daily.  . pantoprazole (PROTONIX) 40 MG tablet Take 1 tablet (40 mg total) by mouth daily.  . [DISCONTINUED] lisinopril (ZESTRIL) 10 MG tablet Take 1 tablet (10 mg total) by mouth daily.  . nitroGLYCERIN (NITROSTAT) 0.4 MG SL tablet Place 1 tablet (0.4 mg total) under the tongue every 5 (five) minutes as needed for chest pain.  . [EXPIRED] triamcinolone acetonide (KENALOG-40) injection 40 mg    No facility-administered encounter medications on file as of 01/23/2021.

## 2021-01-23 NOTE — Telephone Encounter (Signed)
Attempted to call patient in regards to Cardiac Rehab - unable to leave vm, vm box not set up.  Mailed letter

## 2021-01-27 ENCOUNTER — Other Ambulatory Visit: Payer: Self-pay

## 2021-01-27 MED ORDER — ATORVASTATIN CALCIUM 80 MG PO TABS: 80.0000 mg | ORAL_TABLET | Freq: Every evening | ORAL | 3 refills | Status: DC

## 2021-02-06 ENCOUNTER — Telehealth (HOSPITAL_COMMUNITY): Payer: Self-pay

## 2021-02-06 ENCOUNTER — Telehealth: Payer: Self-pay | Admitting: Family Medicine

## 2021-02-06 NOTE — Telephone Encounter (Signed)
Cologuard order faxed to [phone] as instructed by Dr. Lorelei Pont.  Mr. Kittel notified via telephone.

## 2021-02-06 NOTE — Telephone Encounter (Signed)
Patient called in stating that he received something the mail from [employer] that he is need of getting another one its been 3 years. Patient is wanting to know if he needs to do it . If so, Can you please put a order in for it. Please advise. EM

## 2021-02-06 NOTE — Telephone Encounter (Signed)
No response from pt.  Closed referral  

## 2021-02-06 NOTE — Telephone Encounter (Signed)
Can you help set him up

## 2021-02-07 ENCOUNTER — Other Ambulatory Visit: Payer: Self-pay | Admitting: Thoracic Surgery (Cardiothoracic Vascular Surgery)

## 2021-02-14 ENCOUNTER — Telehealth: Payer: Self-pay | Admitting: Internal Medicine

## 2021-02-14 DIAGNOSIS — Z1211 Encounter for screening for malignant neoplasm of colon: Secondary | ICD-10-CM | POA: Diagnosis not present

## 2021-02-14 DIAGNOSIS — Z1212 Encounter for screening for malignant neoplasm of rectum: Secondary | ICD-10-CM | POA: Diagnosis not present

## 2021-02-14 LAB — COLOGUARD: Cologuard: NEGATIVE

## 2021-02-14 NOTE — Telephone Encounter (Signed)
Spoke with monica, they did a PAD screening and the patients ABI on the right was .81. aware he had vascular screening prior to CABG and it also showed mild disease in the right leg as well. The patient is not having any symptoms, she was calling the report.

## 2021-02-15 ENCOUNTER — Other Ambulatory Visit: Payer: Self-pay | Admitting: *Deleted

## 2021-02-15 MED ORDER — IPRATROPIUM BROMIDE 0.06 % NA SOLN: NASAL | 0 refills | Status: DC

## 2021-02-23 LAB — COLOGUARD: COLOGUARD: NEGATIVE

## 2021-02-27 ENCOUNTER — Encounter: Payer: Self-pay | Admitting: Family Medicine

## 2021-04-20 ENCOUNTER — Encounter: Payer: Self-pay | Admitting: Family Medicine

## 2021-04-20 ENCOUNTER — Ambulatory Visit (INDEPENDENT_AMBULATORY_CARE_PROVIDER_SITE_OTHER): Payer: Medicare Other | Admitting: Family Medicine

## 2021-04-20 ENCOUNTER — Other Ambulatory Visit: Payer: Self-pay

## 2021-04-20 VITALS — BP 112/60 | HR 65 | Temp 97.8°F | Ht 70.0 in | Wt 150.0 lb

## 2021-04-20 DIAGNOSIS — A084 Viral intestinal infection, unspecified: Secondary | ICD-10-CM | POA: Diagnosis not present

## 2021-04-20 NOTE — Progress Notes (Signed)
Eddie Rajewski T. Aariv Medlock, MD, [place] at [facility] [place], [address]  Phone: [phone]  FAX: [phone]  Eddie Fox - 80 y.o. male  MRN [id number]  Date of Birth: [birth year]  Date: 04/20/2021  PCP: Eddie Loffler, MD  Referral: Eddie Loffler, MD  Chief Complaint  Patient presents with  . Diarrhea    This visit occurred during the SARS-CoV-2 public health emergency.  Safety protocols were in place, including screening questions prior to the visit, additional usage of staff PPE, and extensive cleaning of exam room while observing appropriate contact time as indicated for disinfecting solutions.   Subjective:   Eddie Fox is a 80 y.o. very pleasant male patient with Body mass index is 21.52 kg/m. who presents with the following:  5 days of diarrhea:  Sat night she did have a game at [employer].  Did have a cook-off, and he then ate some food. In the afternoon diarrhea started.  Went through Monday, then Tuesday got better some.  Started back again Tuesday night, but will take max immodium.  Still having gas.  Not visiting anyone.  Did go to church - started Sunday afternoon.   Tuesday he did go to [place].  At this point, he does not have any diarrhea, and he does have some gas only.  No abdominal pain.  Review of Systems is noted in the HPI, as appropriate  Objective:   BP 112/60   Pulse 65   Temp 97.8 F (36.6 C) (Temporal)   Ht 5\' 10"  (1.778 m)   Wt 150 lb (68 kg)   SpO2 96%   BMI 21.52 kg/m   GEN: No acute distress; alert,appropriate. PULM: Breathing comfortably in no respiratory distress PSYCH: Normally interactive.  ABD: S, NT, ND, + BS, No rebound, No HSM   Laboratory and Imaging Data:  Assessment and Plan:     ICD-10-CM   1. Viral gastroenteritis  A08.4    Viral gastroenteritis.  Fluids.  As needed Imodium.  Resolving.  Less likely  could be COVID, 5 days after onset.  Continue to wear mask, and I would give this a couple more days until all of his symptoms had resolved before leaving home.   Signed,  Maud Deed. Kristiane Morsch, MD   Outpatient Encounter Medications as of 04/20/2021  Medication Sig  . acetaminophen (TYLENOL) 500 MG tablet Take 1,000 mg by mouth every 6 (six) hours as needed for mild pain or headache.  Marland Kitchen aspirin 81 MG tablet Take 1 tablet (81 mg total) by mouth daily.  Marland Kitchen atorvastatin (LIPITOR) 80 MG tablet Take 1 tablet (80 mg total) by mouth every evening.  . Calcium-Phosphorus-Vitamin D (VITAMIN D3/CALCIUM/PHOSPHORUS) 120-100-78 UNIT-MG TABS Take 1 tablet by mouth.  . fluticasone (FLONASE) 50 MCG/ACT nasal spray USE 2 SPRAYS IN BOTH  NOSTRILS ONCE DAILY AS  NEEDED  . ipratropium (ATROVENT) 0.06 % nasal spray PLACE 2 SPRAYS INTO BOTH NOSTRILS 4 (FOUR) TIMES DAILY AS NEEDED FOR RUNNY NOSE  . lisinopril (ZESTRIL) 20 MG tablet Take 1 tablet (20 mg total) by mouth daily.  . metoprolol tartrate (LOPRESSOR) 25 MG tablet Take 0.5 tablets (12.5 mg total) by mouth 2 (two) times daily.  . Multiple Vitamins-Minerals (MULTIVITAMIN MEN 50+ PO) Take 1 tablet by mouth daily.  . pantoprazole (PROTONIX) 40 MG tablet Take 1 tablet (40 mg total) by mouth daily.  . nitroGLYCERIN (NITROSTAT) 0.4 MG SL tablet  Place 1 tablet (0.4 mg total) under the tongue every 5 (five) minutes as needed for chest pain.   No facility-administered encounter medications on file as of 04/20/2021.

## 2021-05-05 ENCOUNTER — Ambulatory Visit: Payer: Medicare Other | Admitting: Internal Medicine

## 2021-05-05 ENCOUNTER — Other Ambulatory Visit: Payer: Self-pay

## 2021-05-05 ENCOUNTER — Encounter: Payer: Self-pay | Admitting: Internal Medicine

## 2021-05-05 VITALS — BP 144/52 | HR 66 | Ht 70.0 in | Wt 151.4 lb

## 2021-05-05 DIAGNOSIS — I493 Ventricular premature depolarization: Secondary | ICD-10-CM

## 2021-05-05 DIAGNOSIS — Z951 Presence of aortocoronary bypass graft: Secondary | ICD-10-CM

## 2021-05-05 NOTE — Patient Instructions (Signed)
Medication Instructions:  No Changes In Medications at this time.  *If you need a refill on your cardiac medications before your next appointment, please call your pharmacy*  Lab Work: BMET, and [place]  If you have labs (blood work) drawn today and your tests are completely normal, you will receive your results only by: Marland Kitchen MyChart Message (if you have MyChart) OR . A paper copy in the mail If you have any lab test that is abnormal or we need to change your treatment, we will call you to review the results.  Testing/Procedures: Your physician has requested that you have an echocardiogram in 6 Months. Echocardiography is a painless test that uses sound waves to create images of your heart. It provides your doctor with information about the size and shape of your heart and how well your heart's chambers and valves are working. You may receive an ultrasound enhancing agent through an IV if needed to better visualize your heart during the echo.This procedure takes approximately one hour. There are no restrictions for this procedure. This will take place at the [address].   Follow-Up: At [facility], you and your health needs are our priority.  As part of our continuing mission to provide you with exceptional heart care, we have created designated Provider Care Teams.  These Care Teams include your primary Cardiologist (physician) and Advanced Practice Providers (APPs -  Physician Assistants and Nurse Practitioners) who all work together to provide you with the care you need, when you need it.  We recommend signing up for the patient portal called "MyChart".  Sign up information is provided on this After Visit Summary.  MyChart is used to connect with patients for Virtual Visits (Telemedicine).  Patients are able to view lab/test results, encounter notes, upcoming appointments, etc.  Non-urgent messages can be sent to your provider as well.   To learn more about what you can do  with MyChart, go to [email or web address].    Your next appointment:   6 month(s) RIGHT AFTER ECHOCARDIOGRAM   The format for your next appointment:   In Person  Provider:   Cherlynn Kaiser, MD

## 2021-05-05 NOTE — Progress Notes (Signed)
Cardiology Office Note:    Date:  10/21/2020   ID:  Eddie Fox, DOB 10/19/1941, MRN [id number]  PCP:  Owens Loffler, MD  Cardiologist:  Elouise Munroe, MD  Electrophysiologist:  None   Referring MD: Owens Loffler, MD   Chief Complaint/Reason for Referral: Follow up s/p CABG  History of Present Illness:    Eddie Fox is a 80 y.o. male with a history of HLD, NASH, HTN who presents for evaluation of chest discomfort and PACs.  Found to have abnormal stress test and subsequently on cardiac cath noted to have multivessel disease.  He is status post CABG x4 on 11/21/2020.  05/05/2021: Overall he is feeling good. He has recovered well from his CABG in December. About a month ago he developed a new cough, worse when lying down at night. The cough causes some minor chest pain at the surgical site due to sternal incision. He denies any chest pain inside of his chest. He believes that his cough is caused by post-nasal drip that first began after his surgery.  He has been prescribed Atrovent which is helping quite a bit.  Since his CABG he has been trying to regain weight but has had little success. He is active with mowing the lawn and other yard work.  He has no angina but does continue to carry his nitroglycerin which I agree with.  He denies any shortness of breath, palpitations, or exertional symptoms. No headaches, lightheadedness, or syncope to report. Also has no lower extremity edema, orthopnea or PND.  At this visit he presents a blood pressure log, which demonstrates stable at home readings recently. Previously, when checking his blood pressure, his machine would read "low" and not give a number. He has discontinued his metoprolol this past month because of this.  I suspect it was bradycardia prompting these low readings, blood pressure is otherwise controlled with lisinopril 20 mg daily.  Past Medical History:  Diagnosis Date  . Benign neoplasm of colon   . ED  (erectile dysfunction)   . IBS (irritable bowel syndrome)   . Melanoma ([place])    history of  . Other and unspecified hyperlipidemia   . Other chronic nonalcoholic liver disease   . PONV (postoperative nausea and vomiting)   . Unspecified essential hypertension   . Unspecified vitamin D deficiency     Past Surgical History:  Procedure Laterality Date  . AMPUTATION     distal phalanx of right thumb- traumatic  . CHOLECYSTECTOMY  10/03/09   lap  . CORONARY ARTERY BYPASS GRAFT N/A 11/21/2020   Procedure: CORONARY ARTERY BYPASS GRAFTING (CABG) X 4, USING LEFT INTERNAL MAMMARY ARTERY AND RIGHT LEG GREATER SAPHENOUS VEIN HARVESTED ENDOSCOPICALLY;  Surgeon: Lajuana Matte, MD;  Location: [place];  Service: Open Heart Surgery;  Laterality: N/A;  . LAMINECTOMY  1995   L 3-T11 Harrington rods in back  . LAPAROSCOPIC APPENDECTOMY N/A 05/16/2016   Procedure: APPENDECTOMY LAPAROSCOPIC;  Surgeon: Greer Pickerel, MD;  Location: [place];  Service: General;  Laterality: N/A;  . LEFT HEART CATH AND CORONARY ANGIOGRAPHY N/A 11/18/2020   Procedure: LEFT HEART CATH AND CORONARY ANGIOGRAPHY;  Surgeon: Troy Sine, MD;  Location: [place] [facility];  Service: Cardiovascular;  Laterality: N/A;  . MELANOMA EXCISION  1995   right upper abd, Dr. Ronnald Ramp  . METATARSAL HEAD EXCISION Right 06/23/2020   Procedure: RIGHT 5TH METATARSAL OSTEOTOMY WITH BUNIONETTE EXCISION, OPEN REDUCTION OF FIFTH METATARSAL PHALANGEAL JOINT, FIFTH METATARSAL PHALANGEAL CAPSULOTOMY, BURSECTOMY;  Surgeon: Erle Crocker, MD;  Location: [place];  Service: Orthopedics;  Laterality: Right;  LENGTH OF SURGERY: 1.5 HOURS  . ORIF ANKLE FRACTURE  1967  . TEE WITHOUT CARDIOVERSION N/A 11/21/2020   Procedure: TRANSESOPHAGEAL ECHOCARDIOGRAM (TEE);  Surgeon: Lajuana Matte, MD;  Location: [place];  Service: Open Heart Surgery;  Laterality: N/A;    Current Medications: Current Meds  Medication Sig  . acetaminophen  (TYLENOL) 500 MG tablet Take 1,000 mg by mouth every 6 (six) hours as needed for mild pain or headache.  Marland Kitchen aspirin 81 MG tablet Take 1 tablet (81 mg total) by mouth daily.  Marland Kitchen atorvastatin (LIPITOR) 80 MG tablet Take 1 tablet (80 mg total) by mouth every evening.  . Calcium-Phosphorus-Vitamin D (VITAMIN D3/CALCIUM/PHOSPHORUS) 120-100-78 UNIT-MG TABS Take 1 tablet by mouth.  . fluticasone (FLONASE) 50 MCG/ACT nasal spray USE 2 SPRAYS IN BOTH  NOSTRILS ONCE DAILY AS  NEEDED  . ipratropium (ATROVENT) 0.06 % nasal spray PLACE 2 SPRAYS INTO BOTH NOSTRILS 4 (FOUR) TIMES DAILY AS NEEDED FOR RUNNY NOSE  . lisinopril (ZESTRIL) 20 MG tablet Take 1 tablet (20 mg total) by mouth daily.  . Multiple Vitamins-Minerals (MULTIVITAMIN MEN 50+ PO) Take 1 tablet by mouth daily.  . pantoprazole (PROTONIX) 40 MG tablet Take 1 tablet (40 mg total) by mouth daily.     Allergies:   Hydrocodone and Pneumococcal vaccine   Social History   Tobacco Use  . Smoking status: Never Smoker  . Smokeless tobacco: Never Used  Vaping Use  . Vaping Use: Never used  Substance Use Topics  . Alcohol use: No  . Drug use: No     Family History: The patient's family history includes Diabetes in his mother; Heart disease in his father and mother; Hypertension in his father, mother, sister, and sister; Stroke in his mother. There is no history of Depression, Alcohol abuse, or Drug abuse.  ROS:   Please see the history of present illness.    (+) Cough (+) Post-nasal drip (+) Minor chest pain at surgical site All other systems reviewed and are negative.  EKGs/Labs/Other Studies Reviewed:    The following studies were reviewed today:  Echo TEE 11/21/2020: POST-OP IMPRESSIONS  Limited post CPB exam:  - Left Ventricle: The left ventricular function is unchanged from the  pre-bypass  study. There are no regional wall motion abnormalities. Overall EF is  50-60%.  - Aortic Valve: The aortic valve appears unchanged from  pre-bypass. There  is  trivial, central AI.  - Mitral Valve: The mitral valve appears unchanged from pre-bypass. There  is no  MR.  - Tricuspid Valve: The tricuspid valve appears unchanged from pre-bypass.  There  is trivial TR.   Vas US Doppler Pre-CABG 11/18/2020: Right Carotid: Velocities in the right ICA are consistent with a 1-39%  stenosis.   Left Carotid: Velocities in the left ICA are consistent with a 1-39%  stenosis.  Vertebrals: Bilateral vertebral arteries demonstrate antegrade flow.  Subclavians: Normal flow hemodynamics were seen in bilateral subclavian        arteries.   Right ABI: Resting right ankle-brachial index indicates mild right lower  extremity arterial disease.  Left ABI: Resting left ankle-brachial index is within normal range. No  evidence of significant left lower extremity arterial disease.  Right Upper Extremity: Doppler waveform obliterate with right radial  compression. Doppler waveform obliterate with right ulnar compression.  Left Upper Extremity: Doppler waveform obliterate with left radial  compression. Doppler waveforms  remain within normal limits with left ulnar  compression.   [facility] Cath 11/18/2020:  Dist RCA lesion is 90% stenosed.  RPAV lesion is 60% stenosed.  Mid RCA lesion is 30% stenosed.  Ost Cx to Prox Cx lesion is 95% stenosed.  Prox LAD lesion is 50% stenosed.  1st Diag lesion is 50% stenosed.  Mid LAD-1 lesion is 70% stenosed.  Mid LAD-2 lesion is 80% stenosed.  Dist LAD-1 lesion is 85% stenosed.  Dist LAD-2 lesion is 85% stenosed.  The left ventricular systolic function is normal.  LV end diastolic pressure is low.   Severe multivessel CAD with evidence for coronary calcification involving the left coronary system.   There is significant segmental LAD stenoses of 50% proximally also involving the first diagonal vessel followed by 70% stenosis in the proximal to mid segment, and segmental 80 to 85% mid  stenosis.  Left circumflex stenosis 95% ostial stenosis and reduced antegrade flow secondary to competitive filling via RCA collateralization.  Dominant RCA with significant vessel tortuosity with 30% mid stenosis, focal 90% stenosis immediately proximal to the takeoff of the PDA vessel followed by 60% stenosis of the continuation branch PLA with collateralization to the distal circumflex from the distal RCA branches.  Preserved LV function without focal segmental wall motion abnormalities.  LVEDP 6 mm.  RECOMMENDATION: Recommend surgical consultation for CABG revascularization.  With the patient's high-grade distal RCA stenosis collateralizing the circumflex vessel with 95% ostial stenosis and with diffuse LAD disease, recommend the patient stay in the hospital.  Plan for increased medical therapy with addition of nitrates, very low dose beta-blocker as pulse allows and initiation of heparin later tonight.    EKG: 05/05/2021: Sinus rhythm, frequent PVC's in bigeminy  Recent Labs: 09/26/2020: TSH 2.98 11/21/2020: ALT 11 11/22/2020: Magnesium 2.5 11/24/2020: BUN 25; Creatinine, Ser 0.96; Potassium 3.8; Sodium 141 11/25/2020: Hemoglobin 8.9; Platelets 140  Recent Lipid Panel    Component Value Date/Time   CHOL 144 11/23/2019 1151   TRIG 105.0 11/23/2019 1151   HDL 41.00 11/23/2019 1151   CHOLHDL 4 11/23/2019 1151   VLDL 21.0 11/23/2019 1151   LDLCALC 82 11/23/2019 1151   LDLDIRECT 171.9 07/08/2007 1044    Physical Exam:    VS:  BP (!) 144/52   Pulse 66   Ht 5\' 10"  (1.778 m)   Wt 151 lb 6.4 oz (68.7 kg)   SpO2 97%   BMI 21.72 kg/m     Wt Readings from Last 5 Encounters:  05/05/21 151 lb 6.4 oz (68.7 kg)  04/20/21 150 lb (68 kg)  01/23/21 153 lb 12 oz (69.7 kg)  01/13/21 152 lb (68.9 kg)  01/13/21 151 lb 12.8 oz (68.9 kg)    Constitutional: No acute distress Cardiovascular: regular rhythm, normal rate, no murmurs. S1 and S2 normal. Radial pulses normal bilaterally. No  jugular venous distention.  Sternal incision is clean dry and intact and healing well. Respiratory: clear to auscultation bilaterally GI : normal bowel sounds, soft and nontender. No distention.   MSK: extremities warm, well perfused. No edema.  NEURO: grossly nonfocal exam, moves all extremities. PSYCH: alert and oriented x 3, normal mood and affect.   ASSESSMENT:    1. S/P CABG x 4   2. PVC's (premature ventricular contractions)    PLAN:    Status post CABG x4-doing well currently on aspirin 81 mg daily, atorvastatin 80 mg daily, lisinopril 20 mg daily.  Due to presumed bradycardia and likely some hypotension as well, metoprolol has  been discontinued.  He does have PVCs and bigeminy today but this is also after a viral gastroenteritis last week.  We will recheck electrolytes today.  He did self increase his lisinopril for about a month and has not had his renal function checked per his report after this.  We will ensure renal function is normal he has returned to lisinopril 20 mg daily as well.  PVCs/PACs- he has had frequent ectopy in the past, but today PVCs are in bigeminy.  We will check electrolytes, I would also like to recheck LV function in 6 months.  He is asymptomatic right now and no signs of heart failure.  HTN - continue lisinopril 20 mg daily, BP well controlled.   HLD -now on atorvastatin 80 mg daily, previously normal lipids.  Total time of encounter: 30 minutes total time of encounter, including 20 minutes spent in face-to-face patient care on the date of this encounter. This time includes coordination of care and counseling regarding above mentioned problem list. Remainder of non-face-to-face time involved reviewing chart documents/testing relevant to the patient encounter and documentation in the medical record. I have independently reviewed documentation from referring provider.   Cherlynn Kaiser, MD [place]  [facility]    Medication Adjustments/Labs and  Tests Ordered: Current medicines are reviewed at length with the patient today.  Concerns regarding medicines are outlined above.   Orders Placed This Encounter  Procedures  . Basic metabolic panel  . Magnesium  . EKG 12-Lead  . ECHOCARDIOGRAM COMPLETE    No orders of the defined types were placed in this encounter.   Patient Instructions  Medication Instructions:  No Changes In Medications at this time.  *If you need a refill on your cardiac medications before your next appointment, please call your pharmacy*  Lab Work: BMET, and [place]  If you have labs (blood work) drawn today and your tests are completely normal, you will receive your results only by: Marland Kitchen MyChart Message (if you have MyChart) OR . A paper copy in the mail If you have any lab test that is abnormal or we need to change your treatment, we will call you to review the results.  Testing/Procedures: Your physician has requested that you have an echocardiogram in 6 Months. Echocardiography is a painless test that uses sound waves to create images of your heart. It provides your doctor with information about the size and shape of your heart and how well your heart's chambers and valves are working. You may receive an ultrasound enhancing agent through an IV if needed to better visualize your heart during the echo.This procedure takes approximately one hour. There are no restrictions for this procedure. This will take place at the [address], [address].   Follow-Up: At [facility], you and your health needs are our priority.  As part of our continuing mission to provide you with exceptional heart care, we have created designated Provider Care Teams.  These Care Teams include your primary Cardiologist (physician) and Advanced Practice Providers (APPs -  Physician Assistants and Nurse Practitioners) who all work together to provide you with the care you need, when you need it.  We recommend signing up for the  patient portal called "MyChart".  Sign up information is provided on this After Visit Summary.  MyChart is used to connect with patients for Virtual Visits (Telemedicine).  Patients are able to view lab/test results, encounter notes, upcoming appointments, etc.  Non-urgent messages can be sent to your  provider as well.   To learn more about what you can do with MyChart, go to [email or web address].    Your next appointment:   6 month(s) RIGHT AFTER ECHOCARDIOGRAM   The format for your next appointment:   In Person  Provider:   Cherlynn Kaiser, MD       [facility],acting as a scribe for Elouise Munroe, MD.,have documented all relevant documentation on the behalf of Elouise Munroe, MD,as directed by  Elouise Munroe, MD while in the presence of Elouise Munroe, MD.  I, Elouise Munroe, MD, have reviewed all documentation for this visit. The documentation on 05/05/21 for the exam, diagnosis, procedures, and orders are all accurate and complete.

## 2021-05-06 LAB — BASIC METABOLIC PANEL
BUN/Creatinine Ratio: 19 (ref 10–24)
BUN: 25 mg/dL (ref 8–27)
CO2: 21 mmol/L (ref 20–29)
Calcium: 9.1 mg/dL (ref 8.6–10.2)
Chloride: 104 mmol/L (ref 96–106)
Creatinine, Ser: 1.29 mg/dL — ABNORMAL HIGH (ref 0.76–1.27)
Glucose: 82 mg/dL (ref 65–99)
Potassium: 5 mmol/L (ref 3.5–5.2)
Sodium: 141 mmol/L (ref 134–144)
eGFR: 56 mL/min/{1.73_m2} — ABNORMAL LOW (ref >59)

## 2021-05-06 LAB — MAGNESIUM: Magnesium: 2.4 mg/dL — ABNORMAL HIGH (ref 1.6–2.3)

## 2021-05-17 ENCOUNTER — Other Ambulatory Visit: Payer: Self-pay | Admitting: Family Medicine

## 2021-05-17 ENCOUNTER — Telehealth: Payer: Self-pay | Admitting: Internal Medicine

## 2021-05-17 DIAGNOSIS — Z79899 Other long term (current) drug therapy: Secondary | ICD-10-CM

## 2021-05-17 NOTE — Telephone Encounter (Signed)
Patient was returning call for results 

## 2021-05-17 NOTE — Telephone Encounter (Signed)
Patient aware of lab results and recommendations from Dr. Margaretann Loveless. Lab order placed, patient aware of instructions for repeat labs next week. Advised patient to call back to office with any issues, questions, or concerns. Patient verbalized understanding.

## 2021-05-23 ENCOUNTER — Other Ambulatory Visit: Payer: Self-pay

## 2021-05-23 ENCOUNTER — Inpatient Hospital Stay: Payer: Medicare Other

## 2021-05-23 ENCOUNTER — Inpatient Hospital Stay: Payer: Medicare Other | Attending: Oncology

## 2021-05-23 DIAGNOSIS — E785 Hyperlipidemia, unspecified: Secondary | ICD-10-CM | POA: Insufficient documentation

## 2021-05-23 DIAGNOSIS — Z85820 Personal history of malignant melanoma of skin: Secondary | ICD-10-CM | POA: Diagnosis not present

## 2021-05-23 DIAGNOSIS — C181 Malignant neoplasm of appendix: Secondary | ICD-10-CM

## 2021-05-23 DIAGNOSIS — I10 Essential (primary) hypertension: Secondary | ICD-10-CM | POA: Diagnosis not present

## 2021-05-23 DIAGNOSIS — Z85038 Personal history of other malignant neoplasm of large intestine: Secondary | ICD-10-CM | POA: Insufficient documentation

## 2021-05-23 LAB — CEA (IN HOUSE-CHCC): CEA (CHCC-In House): <1 ng/mL (ref 0.00–5.00)

## 2021-05-23 LAB — CEA (ACCESS): CEA (CHCC): <1 ng/mL (ref 0.00–5.00)

## 2021-05-24 DIAGNOSIS — Z79899 Other long term (current) drug therapy: Secondary | ICD-10-CM | POA: Diagnosis not present

## 2021-05-24 LAB — BASIC METABOLIC PANEL
BUN/Creatinine Ratio: 15 (ref 10–24)
BUN: 18 mg/dL (ref 8–27)
CO2: 23 mmol/L (ref 20–29)
Calcium: 9.1 mg/dL (ref 8.6–10.2)
Chloride: 105 mmol/L (ref 96–106)
Creatinine, Ser: 1.18 mg/dL (ref 0.76–1.27)
Glucose: 81 mg/dL (ref 65–99)
Potassium: 5.1 mmol/L (ref 3.5–5.2)
Sodium: 142 mmol/L (ref 134–144)
eGFR: 63 mL/min/{1.73_m2} (ref >59)

## 2021-06-12 DIAGNOSIS — H52203 Unspecified astigmatism, bilateral: Secondary | ICD-10-CM | POA: Diagnosis not present

## 2021-06-12 DIAGNOSIS — Z961 Presence of intraocular lens: Secondary | ICD-10-CM | POA: Diagnosis not present

## 2021-06-13 DIAGNOSIS — Z85828 Personal history of other malignant neoplasm of skin: Secondary | ICD-10-CM | POA: Diagnosis not present

## 2021-06-13 DIAGNOSIS — D692 Other nonthrombocytopenic purpura: Secondary | ICD-10-CM | POA: Diagnosis not present

## 2021-06-13 DIAGNOSIS — L821 Other seborrheic keratosis: Secondary | ICD-10-CM | POA: Diagnosis not present

## 2021-06-13 DIAGNOSIS — L570 Actinic keratosis: Secondary | ICD-10-CM | POA: Diagnosis not present

## 2021-06-13 DIAGNOSIS — D225 Melanocytic nevi of trunk: Secondary | ICD-10-CM | POA: Diagnosis not present

## 2021-06-14 ENCOUNTER — Inpatient Hospital Stay (HOSPITAL_BASED_OUTPATIENT_CLINIC_OR_DEPARTMENT_OTHER): Payer: Medicare Other | Admitting: Oncology

## 2021-06-14 ENCOUNTER — Other Ambulatory Visit: Payer: Self-pay

## 2021-06-14 VITALS — BP 139/41 | HR 62 | Temp 98.1°F | Resp 17 | Ht 70.0 in | Wt 150.4 lb

## 2021-06-14 DIAGNOSIS — Z85038 Personal history of other malignant neoplasm of large intestine: Secondary | ICD-10-CM | POA: Diagnosis not present

## 2021-06-14 DIAGNOSIS — C181 Malignant neoplasm of appendix: Secondary | ICD-10-CM | POA: Diagnosis not present

## 2021-06-14 DIAGNOSIS — E785 Hyperlipidemia, unspecified: Secondary | ICD-10-CM | POA: Diagnosis not present

## 2021-06-14 DIAGNOSIS — Z85820 Personal history of malignant melanoma of skin: Secondary | ICD-10-CM | POA: Diagnosis not present

## 2021-06-14 DIAGNOSIS — I10 Essential (primary) hypertension: Secondary | ICD-10-CM | POA: Diagnosis not present

## 2021-06-14 NOTE — Progress Notes (Signed)
  [facility] OFFICE PROGRESS NOTE   Diagnosis: Goblet cell carcinoid of the appendix  INTERVAL HISTORY:   Mr. Lackey returns for a scheduled visit.  He feels well.  No abdominal pain or difficulty with bowel function.  He lost weight following coronary artery bypass surgery in December 2021.  He has not been able to regain weight, but has a good appetite.  He reports discomfort at the right lower leg where an orthopedic pin was placed in the past.  He reports a recent negative Cologuard test.  Objective:  Vital signs in last 24 hours:  Blood pressure (!) 139/41, pulse 62, temperature 98.1 F (36.7 C), temperature source Oral, resp. rate 17, height 5\' 10"  (1.778 m), weight 150 lb 6.4 oz (68.2 kg), SpO2 99 %.    Lymphatics: No cervical, supraclavicular, axillary, or inguinal nodes Resp: Lungs clear bilaterally Cardio: Regular rhythm GI: No mass, nontender, no hepatosplenomegaly Vascular: No leg edema, chronic stasis change and brown discoloration at the right lower leg, no palpable cord, no erythema    Lab Results:  Lab Results  Component Value Date   WBC 8.2 11/25/2020   HGB 8.9 (L) 11/25/2020   HCT 25.1 (L) 11/25/2020   MCV 93.3 11/25/2020   PLT 140 (L) 11/25/2020   NEUTROABS 4.3 09/26/2020    CMP  Lab Results  Component Value Date   NA 142 05/24/2021   K 5.1 05/24/2021   CL 105 05/24/2021   CO2 23 05/24/2021   GLUCOSE 81 05/24/2021   BUN 18 05/24/2021   CREATININE 1.18 05/24/2021   CALCIUM 9.1 05/24/2021   PROT 5.4 (L) 11/21/2020   ALBUMIN 3.2 (L) 11/21/2020   AST 15 11/21/2020   ALT 11 11/21/2020   ALKPHOS 35 (L) 11/21/2020   BILITOT 0.8 11/21/2020   GFRNONAA >60 11/24/2020   GFRAA 78 11/07/2020    Lab Results  Component Value Date   CEA1 <1.00 05/23/2021    Medications: I have reviewed the patient's current medications.   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   Hypertension   4.  History of an abdominal wall melanoma in 1995   5.  Hyperlipidemia  6.  Coronary artery disease, coronary artery bypass surgery December 2021    Disposition: Mr Stailey remains in clinical remission from the goblet cell carcinoid of the appendix.  He is now 5 years out from diagnosis.  He has a good prognosis for long-term disease-free survival.  He was discharged from the oncology clinic today.  He plans to continue clinical follow-up with Dr. Lorelei Pont.  I am available to see him in the future as needed.  Betsy Coder, MD  06/14/2021  12:18 PM

## 2021-06-21 ENCOUNTER — Telehealth: Payer: Self-pay | Admitting: Family Medicine

## 2021-06-21 NOTE — Telephone Encounter (Signed)
LVM for pt to rtn my call to schedule AWV with [facility].  

## 2021-06-22 NOTE — Telephone Encounter (Signed)
[facility] Night - Client Nonclinical Telephone Record AccessNurse Client [facility] - Client Client Site [facility] Physician Owens Loffler - MD Contact Type Call Who Is Calling Patient / Member / Family / Caregiver Caller Name [place] Phone Number [phone] Call Type Message Only Information Provided Reason for Call Returning a Call from the Office Initial [place] is returning a call. Disp. Time Disposition Final User 06/21/2021 5:06:15 PM General Information Provided Yes Ronnald Ramp, [place] Call Closed By: Eliane Decree Transaction Date/Time: 06/21/2021 5:03:25 PM (ET)

## 2021-08-28 ENCOUNTER — Telehealth: Payer: Self-pay | Admitting: Internal Medicine

## 2021-08-28 DIAGNOSIS — Z79899 Other long term (current) drug therapy: Secondary | ICD-10-CM

## 2021-08-28 NOTE — Telephone Encounter (Signed)
Eddie Munroe, MD  08/10/2021  1:04 PM EDT     Labs grossly stable, would want repeat BMET prior to our next follow up visit.   Spoke with patient regarding the following results. Patient made aware and patient verbalized understanding.

## 2021-08-28 NOTE — Telephone Encounter (Signed)
Eddie Fox is returning Eddie Fox's call from 09/09 in regards to his lab results. Please advise.

## 2021-09-01 ENCOUNTER — Telehealth: Payer: Self-pay | Admitting: Internal Medicine

## 2021-09-01 NOTE — Telephone Encounter (Signed)
Follow Up:      Lenna Sciara is calling to check on ths status of patient's clearance that was sent over 08-04-21. Please fax asap to [phone].

## 2021-09-04 NOTE — Telephone Encounter (Signed)
   Primary Cardiologist: Elouise Munroe, MD  Chart reviewed as part of pre-operative protocol coverage. Simple dental extractions are considered low risk procedures per guidelines and generally do not require any specific cardiac clearance. It is also generally accepted that for simple extractions and dental cleanings, there is no need to interrupt blood thinner therapy.   SBE prophylaxis is not required for the patient.  I will route this recommendation to the requesting party via Epic fax function and remove from pre-op pool.  Please call with questions.  Deberah Pelton, NP 09/04/2021, 1:36 PM

## 2021-09-04 NOTE — Telephone Encounter (Signed)
Chart reviewed as part of preoperative cardiac protocol.  I do not see any preoperative cardiac clearance request for this patient.  Please contact requesting office and have them resend clearance request.  Thank you.  Jossie Ng. Travia Onstad NP-C    09/04/2021, [place] [address] Office [phone] Fax [phone]

## 2021-09-04 NOTE — Telephone Encounter (Signed)
I s/w [facility] earlier today to confirm procedure. In speaking with the dental office it was stated to me that procedure is for dentures. I did ask if any teeth are being extracted at this time, reply back to me was no. Clearance was re-faxed to our office today, however there is nothing implicated on the clearance form.      [facility] Pre-operative Risk Assessment    Patient Name: Eddie Fox  DOB: 1941-11-10 MRN: [id number]  HEARTCARE STAFF:  - IMPORTANT!!!!!! Under Visit Info/Reason for Call, type in Other and utilize the format Clearance MM/DD/YY or Clearance TBD. Do not use dashes or single digits. - Please review there is not already an duplicate clearance open for this procedure. - If request is for dental extraction, please clarify the # of teeth to be extracted. - If the patient is currently at the dentist's office, call Pre-Op Callback Staff (MA/nurse) to input urgent request.  - If the patient is not currently in the dentist office, please route to the Pre-Op pool.  Request for surgical clearance:  What type of surgery is being performed? DENTURES  When is this surgery scheduled? TBD  What type of clearance is required (medical clearance vs. Pharmacy clearance to hold med vs. Both)? MEDICAL  Are there any medications that need to be held prior to surgery and how long? PT IS ON ASA THOUGH NO MEDS ARE BEING REQUESTED TO BE HELD  Practice name and name of physician performing surgery? [facility]  What is the office phone number? [phone]   7.   What is the office fax number? [phone]  8.   Anesthesia type (None, local, MAC, general) ? LOCAL?    Julaine Hua 09/04/2021, 1:27 PM  _________________________________________________________________   (provider comments below)

## 2021-09-04 NOTE — Telephone Encounter (Signed)
S/w [facility] who stated that they did fax over a clearance 08/04/21 to Dr. Margaretann Loveless. [place] stated they will re-fax today to [phone] attn: Arbie Cookey Pre op team. Once I receive the clearance request I will input and send to pre op pool for review.

## 2021-10-11 ENCOUNTER — Ambulatory Visit (INDEPENDENT_AMBULATORY_CARE_PROVIDER_SITE_OTHER): Payer: Medicare Other | Admitting: Family Medicine

## 2021-10-11 ENCOUNTER — Encounter: Payer: Self-pay | Admitting: Family Medicine

## 2021-10-11 ENCOUNTER — Other Ambulatory Visit: Payer: Self-pay

## 2021-10-11 VITALS — BP 138/74 | HR 71 | Temp 98.5°F | Ht 70.0 in | Wt 148.0 lb

## 2021-10-11 DIAGNOSIS — M25551 Pain in right hip: Secondary | ICD-10-CM | POA: Diagnosis not present

## 2021-10-11 MED ORDER — IPRATROPIUM BROMIDE 0.06 % NA SOLN: NASAL | 0 refills | Status: DC

## 2021-10-11 NOTE — Progress Notes (Signed)
Sila Sarsfield T. Delvonte Berenson, MD, [place] at [facility] [place], [address]  Phone: [phone]  FAX: [phone]  Eddie Fox - 80 y.o. male  MRN [id number]  Date of Birth: [birth year]  Date: 10/11/2021  PCP: Owens Loffler, MD  Referral: Owens Loffler, MD  Chief Complaint  Patient presents with   Hip Pain    Right    This visit occurred during the SARS-CoV-2 public health emergency.  Safety protocols were in place, including screening questions prior to the visit, additional usage of staff PPE, and extensive cleaning of exam room while observing appropriate contact time as indicated for disinfecting solutions.   Subjective:   Eddie Fox is a 80 y.o. very pleasant male patient with Body mass index is 21.24 kg/m. who presents with the following:  He is having some right posterior pelvis pain and no groin pain.  He does not describe any radicular pain.  No significant back pain right now.  He points to a focal area caudal to the ischial tuberosity this is also caudal and lateral to the coccyx and adjacent to the sacrum.  R posterior pelvis:  hip.   Posterior pelvis pain    Wt Readings from Last 3 Encounters:  10/11/21 148 lb (67.1 kg)  06/14/21 150 lb 6.4 oz (68.2 kg)  05/05/21 151 lb 6.4 oz (68.7 kg)     Review of Systems is noted in the HPI, as appropriate  Objective:   BP 138/74   Pulse 71   Temp 98.5 F (36.9 C) (Temporal)   Ht 5\' 10"  (1.778 m)   Wt 148 lb (67.1 kg)   SpO2 98%   BMI 21.24 kg/m   GEN: No acute distress; alert,appropriate. PULM: Breathing comfortably in no respiratory distress PSYCH: Normally interactive.    HIP EXAM: SIDE: Right ROM: Abduction, Flexion, Internal and External range of motion: Full Pain with terminal IROM and EROM: Minimal GTB: NT SLR: NEG Knees: No effusion FABER: NT REVERSE FABER: Tender Piriformis: NT at direct palpation Str: flexion:  5/5 abduction: 5/5 adduction: 5/5 Strength testing non-tender   Point of maximal tenderness is lateral to the sacrum.  There is no duction of radiculopathy with deep compression.  Laboratory and Imaging Data:  Assessment and Plan:     ICD-10-CM   1. Acute right hip pain  M25.551      This is not intra-articular, and I do not think that he is referred back pain with this focal pain.  Most likely some posterior pelvic tendinopathy.  I think that continue with some current conservative care, movement and Tylenol makes the most sense.  Certainly could inject this area, but it is more difficult, and it is possible that I could block the sciatic nerve.  He wants to continue to be conservative  Meds ordered this encounter  Medications   ipratropium (ATROVENT) 0.06 % nasal spray    Sig: PLACE 2 SPRAYS INTO BOTH NOSTRILS 4 (FOUR) TIMES DAILY AS NEEDED FOR RUNNY NOSE    Dispense:  45 mL    Refill:  0   Medications Discontinued During This Encounter  Medication Reason   metoprolol tartrate (LOPRESSOR) 25 MG tablet Completed Course   ipratropium (ATROVENT) 0.06 % nasal spray Reorder   No orders of the defined types were placed in this encounter.   Follow-up: No follow-ups on file.  [employer] One speech-to-text software was used for transcription in this dictation.  Possible transcriptional  errors can occur using Editor, commissioning.   Signed,  Maud Deed. Malynn Lucy, MD   Outpatient Encounter Medications as of 10/11/2021  Medication Sig   acetaminophen (TYLENOL) 500 MG tablet Take 1,000 mg by mouth every 6 (six) hours as needed for mild pain or headache.   aspirin 81 MG tablet Take 1 tablet (81 mg total) by mouth daily.   atorvastatin (LIPITOR) 80 MG tablet Take 1 tablet (80 mg total) by mouth every evening.   Calcium-Phosphorus-Vitamin D (VITAMIN D3/CALCIUM/PHOSPHORUS) 120-100-78 UNIT-MG TABS Take 1 tablet by mouth.   fluticasone (FLONASE) 50 MCG/ACT nasal spray USE 2 SPRAYS IN  BOTH  NOSTRILS ONCE DAILY AS  NEEDED   lisinopril (ZESTRIL) 20 MG tablet Take 1 tablet (20 mg total) by mouth daily.   Multiple Vitamins-Minerals (MULTIVITAMIN MEN 50+ PO) Take 1 tablet by mouth daily.   pantoprazole (PROTONIX) 40 MG tablet Take 1 tablet (40 mg total) by mouth daily.   [DISCONTINUED] ipratropium (ATROVENT) 0.06 % nasal spray PLACE 2 SPRAYS INTO BOTH NOSTRILS 4 (FOUR) TIMES DAILY AS NEEDED FOR RUNNY NOSE   ipratropium (ATROVENT) 0.06 % nasal spray PLACE 2 SPRAYS INTO BOTH NOSTRILS 4 (FOUR) TIMES DAILY AS NEEDED FOR RUNNY NOSE   nitroGLYCERIN (NITROSTAT) 0.4 MG SL tablet Place 1 tablet (0.4 mg total) under the tongue every 5 (five) minutes as needed for chest pain.   [DISCONTINUED] metoprolol tartrate (LOPRESSOR) 25 MG tablet Take 0.5 tablets (12.5 mg total) by mouth 2 (two) times daily. (Patient not taking: Reported on 05/05/2021)   No facility-administered encounter medications on file as of 10/11/2021.

## 2021-10-12 ENCOUNTER — Encounter: Payer: Self-pay | Admitting: Family Medicine

## 2021-11-01 ENCOUNTER — Other Ambulatory Visit: Payer: Self-pay | Admitting: Family Medicine

## 2021-11-14 ENCOUNTER — Other Ambulatory Visit: Payer: Self-pay | Admitting: Family Medicine

## 2021-11-15 ENCOUNTER — Other Ambulatory Visit: Payer: Self-pay

## 2021-11-15 ENCOUNTER — Ambulatory Visit (HOSPITAL_COMMUNITY): Payer: Medicare Other | Attending: Internal Medicine

## 2021-11-15 DIAGNOSIS — I10 Essential (primary) hypertension: Secondary | ICD-10-CM | POA: Insufficient documentation

## 2021-11-15 DIAGNOSIS — Z951 Presence of aortocoronary bypass graft: Secondary | ICD-10-CM | POA: Insufficient documentation

## 2021-11-15 DIAGNOSIS — I083 Combined rheumatic disorders of mitral, aortic and tricuspid valves: Secondary | ICD-10-CM | POA: Insufficient documentation

## 2021-11-15 DIAGNOSIS — I493 Ventricular premature depolarization: Secondary | ICD-10-CM | POA: Diagnosis not present

## 2021-11-15 DIAGNOSIS — I340 Nonrheumatic mitral (valve) insufficiency: Secondary | ICD-10-CM

## 2021-11-15 DIAGNOSIS — E785 Hyperlipidemia, unspecified: Secondary | ICD-10-CM | POA: Diagnosis not present

## 2021-11-15 LAB — ECHOCARDIOGRAM COMPLETE
Area-P 1/2: 1.91 cm2
P 1/2 time: 425 msec
S' Lateral: 2.3 cm

## 2021-11-25 NOTE — Progress Notes (Signed)
Cardiology Office Note:    Date:  11/30/2021   ID:  Eddie Fox, DOB [birth year], MRN [id number]  PCP:  Owens Loffler, MD  Cardiologist:  Elouise Munroe, MD  Electrophysiologist:  None   Referring MD: Owens Loffler, MD   Chief Complaint: follow-up of CAD  History of Present Illness:    Eddie Fox is a 80 y.o. male with a history of CAD s/p CABG x4 (LIMA to LAD, SVG to PDA, SVG to OM2, SVG to D1) in 11/2020, frequent PACs/PVCs, hypertension, hyperlipidemia, NASH, and IBS who is followed by Dr. Margaretann Loveless and presents today for follow-up of CAD.  Patient was referred to Dr. Margaretann Loveless in 09/2020 for further evaluation of exertional chest discomfort. She was noted to have bigeminal PACs on EKG. Echo and Myoview were ordered for further evaluation. Myoview showed a non-reversible inferolateral wall perfusion but no significant ischemia. It was considered to low to intermediate risk. Echo showed LVEF of 55-60% with normal wall motion, grade 1 diastolic dysfunction, mild MR, and mild AI. Patient ended up having a left cardiac catheterization in 11/2020 which showed severe multivessel CAD. CT surgery was consulted and patient underwent CABG x4 with LIMA to LAD, SVG to PDA, SVG to OM2, and SVG to D1 on 11/21/2020 with DR. Lightfoot. Post-op course was unremarkable.  Patient was last seen by Dr. Margaretann Loveless in 04/2021 at which time he was doing relatively well. He did report a new cough, worse when laying down at night, for about the past month which had improved with Atrovent. Metoprolol had been discontinued due to presumed bradycardia and likely some hypotension on home BP machine. He was noted to have bigeminy PVC on EKG at that visit. Plan was for repeat Echo in 6 months to reassess LV function.  Echo on 11/15/2021 showed LVEF of 60-65% with normal wall motion, mild LVH, grade 1 diastolic dysfunction, mild to moderate AI, and mild to moderate MR. MR was noted to have an eccentric anterior  directed jet which may be underestimated. Echo reader recommended considering cardiac MRI to quantify severity of MR and AI.  Patient presents today for follow-up. Here alone. Patient doing well since last visit and denies any cardiac symptoms.  No chest pain, shortness of breath, orthopnea, PND, lower extremity edema, palpitations, lightheadedness, dizziness, syncope. He is staying active.  BP is elevated in the office today in the 160s over 70s.  He does not check his BP regularly at home but it sounds like it is normally above goal.  Past Medical History:  Diagnosis Date   Benign neoplasm of colon    ED (erectile dysfunction)    IBS (irritable bowel syndrome)    Melanoma (HCC)    history of   Other and unspecified hyperlipidemia    Other chronic nonalcoholic liver disease    PONV (postoperative nausea and vomiting)    Unspecified essential hypertension    Unspecified vitamin D deficiency     Past Surgical History:  Procedure Laterality Date   AMPUTATION     distal phalanx of right thumb- traumatic   CHOLECYSTECTOMY  10/03/09   lap   CORONARY ARTERY BYPASS GRAFT N/A 11/21/2020   Procedure: CORONARY ARTERY BYPASS GRAFTING (CABG) X 4, USING LEFT INTERNAL MAMMARY ARTERY AND RIGHT LEG GREATER SAPHENOUS VEIN HARVESTED ENDOSCOPICALLY;  Surgeon: Lajuana Matte, MD;  Location: [place];  Service: Open Heart Surgery;  Laterality: N/A;   LAMINECTOMY  1995   L 3-T11 Harrington rods in back   LAPAROSCOPIC  APPENDECTOMY N/A 05/16/2016   Procedure: APPENDECTOMY LAPAROSCOPIC;  Surgeon: Greer Pickerel, MD;  Location: [place];  Service: General;  Laterality: N/A;   LEFT HEART CATH AND CORONARY ANGIOGRAPHY N/A 11/18/2020   Procedure: LEFT HEART CATH AND CORONARY ANGIOGRAPHY;  Surgeon: Troy Sine, MD;  Location: [place] [facility];  Service: Cardiovascular;  Laterality: N/A;   MELANOMA EXCISION  1995   right upper abd, Dr. Donita Brooks HEAD EXCISION Right 06/23/2020   Procedure: RIGHT 5TH  METATARSAL OSTEOTOMY WITH BUNIONETTE EXCISION, OPEN REDUCTION OF FIFTH METATARSAL PHALANGEAL JOINT, FIFTH METATARSAL PHALANGEAL CAPSULOTOMY, BURSECTOMY;  Surgeon: Erle Crocker, MD;  Location: [place];  Service: Orthopedics;  Laterality: Right;  LENGTH OF SURGERY: 1.5 HOURS   ORIF ANKLE FRACTURE  1967   TEE WITHOUT CARDIOVERSION N/A 11/21/2020   Procedure: TRANSESOPHAGEAL ECHOCARDIOGRAM (TEE);  Surgeon: Lajuana Matte, MD;  Location: [place];  Service: Open Heart Surgery;  Laterality: N/A;    Current Medications: Current Meds  Medication Sig   acetaminophen (TYLENOL) 500 MG tablet Take 1,000 mg by mouth every 6 (six) hours as needed for mild pain or headache.   aspirin 81 MG tablet Take 1 tablet (81 mg total) by mouth daily.   atorvastatin (LIPITOR) 80 MG tablet Take 1 tablet (80 mg total) by mouth every evening.   Calcium-Phosphorus-Vitamin D (VITAMIN D3/CALCIUM/PHOSPHORUS) 120-100-78 UNIT-MG TABS Take 1 tablet by mouth.   fluticasone (FLONASE) 50 MCG/ACT nasal spray USE 2 SPRAYS IN BOTH  NOSTRILS ONCE DAILY AS  NEEDED   ipratropium (ATROVENT) 0.06 % nasal spray INSTILL 2 SPRAYS IN BOTH  NOSTRILS 4 TIMES DAILY AS NEEDED FOR RUNNY NOSE   lisinopril (ZESTRIL) 20 MG tablet Take 1 tablet (20 mg total) by mouth daily.   Multiple Vitamins-Minerals (MULTIVITAMIN MEN 50+ PO) Take 1 tablet by mouth daily.   pantoprazole (PROTONIX) 40 MG tablet TAKE 1 TABLET BY MOUTH  DAILY     Allergies:   Hydrocodone and Pneumococcal vaccine   Social History   Socioeconomic History   Marital status: Married    Spouse name: Not on file   Number of children: 2   Years of education: Not on file   Highest education level: Not on file  Occupational History   Occupation: retired    Comment: retired from [employer] 04/2002  Tobacco Use   Smoking status: Never   Smokeless tobacco: Never  Vaping Use   Vaping Use: Never used  Substance and Sexual Activity   Alcohol use: No   Drug  use: No   Sexual activity: Yes    Partners: Female  Other Topics Concern   Not on file  Social History Narrative   Not on file   Social Determinants of Health   Financial Resource Strain: Not on file  Food Insecurity: Not on file  Transportation Needs: Not on file  Physical Activity: Not on file  Stress: Not on file  Social Connections: Not on file     Family History: The patient's family history includes Diabetes in his mother; Heart disease in his father and mother; Hypertension in his father, mother, sister, and sister; Stroke in his mother. There is no history of Depression, Alcohol abuse, or Drug abuse.  ROS:   Please see the history of present illness.     EKGs/Labs/Other Studies Reviewed:    The following studies were reviewed today:  Myoview 10/19/2020: Nuclear stress EF: 56%. There are no significant wall motion abnormalities. There was no ST  segment deviation noted during stress. Defect 1: There is a medium defect of moderate severity present in the basal inferolateral and mid inferolateral location. Prior CT of abdomen in 2018 demonstrates diffuse aortic atherosclerosis as well as RCA and circumflex coronary calcification in the segments visualized. This is a low to intermediate risk study. There is what appears to be a nonreversible inferolateral wall perfusion defect seen at both rest and stress. No significant ischemia identified. _______________  Left Cardiac Catheterization 11/18/2020: Dist RCA lesion is 90% stenosed. RPAV lesion is 60% stenosed. Mid RCA lesion is 30% stenosed. Ost Cx to Prox Cx lesion is 95% stenosed. Prox LAD lesion is 50% stenosed. 1st Diag lesion is 50% stenosed. Mid LAD-1 lesion is 70% stenosed. Mid LAD-2 lesion is 80% stenosed. Dist LAD-1 lesion is 85% stenosed. Dist LAD-2 lesion is 85% stenosed. The left ventricular systolic function is normal. LV end diastolic pressure is low.   Severe multivessel CAD with evidence for coronary  calcification involving the left coronary system.    There is significant segmental LAD stenoses of 50% proximally also involving the first diagonal vessel followed by 70% stenosis in the proximal to mid segment, and segmental 80 to 85% mid stenosis.   Left circumflex stenosis 95% ostial stenosis and reduced antegrade flow secondary to competitive filling via RCA collateralization.   Dominant RCA with significant vessel tortuosity with 30% mid stenosis, focal 90% stenosis immediately proximal to the takeoff of the PDA vessel followed by 60% stenosis of the continuation branch PLA with collateralization to the distal circumflex from the distal RCA branches.   Preserved LV function without focal segmental wall motion abnormalities.  LVEDP 6 mm.   Recommendation: Recommend surgical consultation for CABG revascularization.  With the patient's high-grade distal RCA stenosis collateralizing the circumflex vessel with 95% ostial stenosis and with diffuse LAD disease, recommend the patient stay in the hospital.  Plan for increased medical therapy with addition of nitrates, very low dose beta-blocker as pulse allows and initiation of heparin later tonight.   Diagnostic Dominance: Right  _______________  Echocardiogram 11/15/2021: Impressions: 1. Left ventricular ejection fraction, by estimation, is 60 to 65%. The  left ventricle has normal function. The left ventricle has no regional  wall motion abnormalities. There is mild left ventricular hypertrophy.  Left ventricular diastolic parameters  are consistent with Grade I diastolic dysfunction (impaired relaxation).   2. Right ventricular systolic function is mildly reduced. The right  ventricular size is normal. Tricuspid regurgitation signal is inadequate  for assessing PA pressure.   3. The aortic valve is tricuspid. Aortic valve regurgitation is mild to  moderate. Aortic valve sclerosis is present, with no evidence of aortic  valve stenosis.    4. The mitral valve is abnormal. Mild to moderate mitral valve  regurgitation. Eccentric anterior directed MR jet. While may be  underestimating MR severity given eccentric jet, low E wave velocity with  E<A and normal LA/LV size argues against significant   MR. Could consider cardiac MRI to quantify severity of MR and AI   EKG:  EKG not ordered today.   Recent Labs: 05/05/2021: Magnesium 2.4 05/24/2021: BUN 18; Creatinine, Ser 1.18; Potassium 5.1; Sodium 142  Recent Lipid Panel    Component Value Date/Time   CHOL 144 11/23/2019 1151   TRIG 105.0 11/23/2019 1151   HDL 41.00 11/23/2019 1151   CHOLHDL 4 11/23/2019 1151   VLDL 21.0 11/23/2019 1151   LDLCALC 82 11/23/2019 1151   LDLDIRECT 171.9 07/08/2007 1044  Physical Exam:    Vital Signs: BP (!) 162/75   Pulse 68   Ht 5\' 10"  (1.778 m)   Wt 137 lb 6.4 oz (62.3 kg)   SpO2 98%   BMI 19.71 kg/m     Wt Readings from Last 3 Encounters:  11/30/21 137 lb 6.4 oz (62.3 kg)  10/11/21 148 lb (67.1 kg)  06/14/21 150 lb 6.4 oz (68.2 kg)     General: 80 y.o. thin Caucasian male in no acute distress. HEENT: Normocephalic and atraumatic. Sclera clear.  Neck: Supple. No carotid bruits. No JVD. Heart: RRR. Distinct S1 and S2. No significant murmurs, gallops, or rubs. Radial pulses 2+ and equal bilaterally. Lungs: No increased work of breathing. Clear to ausculation bilaterally. No wheezes, rhonchi, or rales.  Abdomen: Soft, non-distended, and non-tender to palpation.  Extremities: No lower extremity edema.    Skin: Warm and dry. Neuro: Alert and oriented x3. No focal deficits. Psych: Normal affect. Responds appropriately.  Assessment:    1. Coronary artery disease involving native coronary artery of native heart without angina pectoris   2. S/P CABG (coronary artery bypass graft)   3. PAC (premature atrial contraction)   4. PVC (premature ventricular contraction)   5. Mitral valve insufficiency, unspecified etiology   6. Aortic  valve insufficiency, etiology of cardiac valve disease unspecified   7. Primary hypertension   8. Hyperlipidemia, unspecified hyperlipidemia type     Plan:    CAD s/p CABG Found to have severe multivessel CAD on cath in 11/2020 and underwent CABG x4.  - No angina.  - Continue aspirin and high-intensity statin.  - Previously on Metoprolol but this was stopped due to presumed bradycardia and possible hypotension on home BP machine.  Frequent PACs/PVCs Bigeminy PACs and PVCs noted on previous EKGs. Previously on Metoprolol but stopped this due to presumed bradycardia and possible hypotension on home BP machine.  - No ectopy on exam today and no complaints of palpitations.   Mild to Moderate Mitral Regurgitation Mild to Moderate Aortic Insufficiency Noted on recent Echo on 11/15/2021. MR was noted to have an eccentric anterior directed jet which may be underestimated. Echo reader recommended considering cardiac MRI to quantify severity of MR and AI.  - Asymptomatic. No significant murmurs noted on exam.  - Discussed cardiac MRI vs repeat Echo in 1 year and decision was made to repeat Echo. This can be ordered at follow-up visit.  Hypertension BP elevated in the 160s/70s. Patient does not check his BP regularly at home but sounds like it is normally above goal. - Currently on Lisinopril 20mg  daily. - Potassium was borderline elevated at 5.1 on last check in 05/2021. Therefore, will repeat BMET today before making any adjustments. If potassium OK, can increase Lisinopril to 40mg  daily. However, if potassium >5.0, may add Amlodipine 5mg  daily rather than increase Lisinopril. - Advised patient to keep a log of BP/HR for 2 weeks and then send this to [place].  Hyperlipidemia Most recent lipid panel in 11/2019: Total Cholesterol 144, Triglycerides 105, HDL 41, LDL 82. LDL goal <70 given CAD. - Continue Lipitor 80mg  daily.  - Patient states he is scheduled to have lipids rechecked at PCP's office next  week. If LDL still above goal, would add Zetia.  Disposition: Follow up in 6 months.   Medication Adjustments/Labs and Tests Ordered: Current medicines are reviewed at length with the patient today.  Concerns regarding medicines are outlined above.  Orders Placed This Encounter  Procedures   Basic  metabolic panel   No orders of the defined types were placed in this encounter.   Patient Instructions  Medication Instructions:  Your physician recommends that you continue on your current medications as directed. Please refer to the Current Medication list given to you today.  *If you need a refill on your cardiac medications before your next appointment, please call your pharmacy*  Lab Work: Your physician recommends that you return for lab work TODAY:  BMET  If you have labs (blood work) drawn today and your tests are completely normal, you will receive your results only by: [place] (if you have MyChart) OR A paper copy in the mail If you have any lab test that is abnormal or we need to change your treatment, we will call you to review the results.  Testing/Procedures: NONE ordered at this time of appointment   Follow-Up: At [facility], you and your health needs are our priority.  As part of our continuing mission to provide you with exceptional heart care, we have created designated Provider Care Teams.  These Care Teams include your primary Cardiologist (physician) and Advanced Practice Providers (APPs -  Physician Assistants and Nurse Practitioners) who all work together to provide you with the care you need, when you need it.  We recommend signing up for the patient portal called "MyChart".  Sign up information is provided on this After Visit Summary.  MyChart is used to connect with patients for Virtual Visits (Telemedicine).  Patients are able to view lab/test results, encounter notes, upcoming appointments, etc.  Non-urgent messages can be sent to your provider as  well.   To learn more about what you can do with MyChart, go to [email or web address].    Your next appointment:   6 month(s)  The format for your next appointment:   In Person  Provider:   Elouise Munroe, MD  or Sande Rives, PA-C   Other Instructions    Signed, Darreld Mclean, PA-C  11/30/2021 12:38 PM    [place]

## 2021-11-30 ENCOUNTER — Ambulatory Visit: Payer: Medicare Other | Admitting: Student

## 2021-11-30 ENCOUNTER — Other Ambulatory Visit: Payer: Self-pay

## 2021-11-30 ENCOUNTER — Ambulatory Visit: Payer: Medicare Other | Admitting: Internal Medicine

## 2021-11-30 ENCOUNTER — Encounter: Payer: Self-pay | Admitting: Student

## 2021-11-30 VITALS — BP 162/75 | HR 68 | Ht 70.0 in | Wt 137.4 lb

## 2021-11-30 DIAGNOSIS — I351 Nonrheumatic aortic (valve) insufficiency: Secondary | ICD-10-CM | POA: Diagnosis not present

## 2021-11-30 DIAGNOSIS — I340 Nonrheumatic mitral (valve) insufficiency: Secondary | ICD-10-CM | POA: Diagnosis not present

## 2021-11-30 DIAGNOSIS — I10 Essential (primary) hypertension: Secondary | ICD-10-CM

## 2021-11-30 DIAGNOSIS — I2510 Atherosclerotic heart disease of native coronary artery without angina pectoris: Secondary | ICD-10-CM

## 2021-11-30 DIAGNOSIS — I493 Ventricular premature depolarization: Secondary | ICD-10-CM

## 2021-11-30 DIAGNOSIS — Z951 Presence of aortocoronary bypass graft: Secondary | ICD-10-CM

## 2021-11-30 DIAGNOSIS — I491 Atrial premature depolarization: Secondary | ICD-10-CM

## 2021-11-30 DIAGNOSIS — E785 Hyperlipidemia, unspecified: Secondary | ICD-10-CM | POA: Diagnosis not present

## 2021-11-30 NOTE — Patient Instructions (Signed)
Medication Instructions:  Your physician recommends that you continue on your current medications as directed. Please refer to the Current Medication list given to you today.  *If you need a refill on your cardiac medications before your next appointment, please call your pharmacy*  Lab Work: Your physician recommends that you return for lab work TODAY:  BMET  If you have labs (blood work) drawn today and your tests are completely normal, you will receive your results only by: [place] (if you have MyChart) OR A paper copy in the mail If you have any lab test that is abnormal or we need to change your treatment, we will call you to review the results.  Testing/Procedures: NONE ordered at this time of appointment   Follow-Up: At [facility], you and your health needs are our priority.  As part of our continuing mission to provide you with exceptional heart care, we have created designated Provider Care Teams.  These Care Teams include your primary Cardiologist (physician) and Advanced Practice Providers (APPs -  Physician Assistants and Nurse Practitioners) who all work together to provide you with the care you need, when you need it.  We recommend signing up for the patient portal called "MyChart".  Sign up information is provided on this After Visit Summary.  MyChart is used to connect with patients for Virtual Visits (Telemedicine).  Patients are able to view lab/test results, encounter notes, upcoming appointments, etc.  Non-urgent messages can be sent to your provider as well.   To learn more about what you can do with MyChart, go to [email or web address].    Your next appointment:   6 month(s)  The format for your next appointment:   In Person  Provider:   Elouise Munroe, MD  or Sande Rives, PA-C   Other Instructions

## 2021-11-30 NOTE — Progress Notes (Signed)
Subjective:   Eddie Fox is a 80 y.o. male who presents for Medicare Annual/Subsequent preventive examination.  I connected with Kizzie Bane today by telephone and verified that I am speaking with the correct person using two identifiers. Location patient: home Location provider: work Persons participating in the virtual visit: patient, Marine scientist.    I discussed the limitations, risks, security and privacy concerns of performing an evaluation and management service by telephone and the availability of in person appointments. I also discussed with the patient that there may be a patient responsible charge related to this service. The patient expressed understanding and verbally consented to this telephonic visit.    Interactive audio and video telecommunications were attempted between this provider and patient, however failed, due to patient having technical difficulties OR patient did not have access to video capability.  We continued and completed visit with audio only.  Some vital signs may be absent or patient reported.   Time Spent with patient on telephone encounter: 30 minutes  Review of Systems     Cardiac Risk Factors include: advanced age (>78men, >17 women);hypertension;dyslipidemia     Objective:    Today's Vitals   12/04/21 1200  Weight: 148 lb (67.1 kg)  Height: 5\' 10"  (1.778 m)   Body mass index is 21.24 kg/m.  Advanced Directives 12/04/2021 11/21/2020 11/18/2020 06/23/2020 06/15/2020 05/24/2020 11/23/2019  Does Patient Have a Medical Advance Directive? Yes No No Yes Yes No No  Type of Advance Directive Living will - - Living will - - -  Does patient want to make changes to medical advance directive? Yes (MAU/Ambulatory/Procedural Areas - Information given) - - No - Patient declined - - -  Copy of [place] in Chart? - - - No - copy requested No - copy requested - -  Would patient like information on creating a medical advance directive? - No -  Patient declined No - Patient declined - - No - Patient declined No - Patient declined    Current Medications (verified) Outpatient Encounter Medications as of 12/04/2021  Medication Sig   acetaminophen (TYLENOL) 500 MG tablet Take 1,000 mg by mouth every 6 (six) hours as needed for mild pain or headache.   aspirin 81 MG tablet Take 1 tablet (81 mg total) by mouth daily.   atorvastatin (LIPITOR) 80 MG tablet Take 1 tablet (80 mg total) by mouth every evening.   Calcium-Phosphorus-Vitamin D (VITAMIN D3/CALCIUM/PHOSPHORUS) 120-100-78 UNIT-MG TABS Take 1 tablet by mouth.   fluticasone (FLONASE) 50 MCG/ACT nasal spray USE 2 SPRAYS IN BOTH  NOSTRILS ONCE DAILY AS  NEEDED   ipratropium (ATROVENT) 0.06 % nasal spray INSTILL 2 SPRAYS IN BOTH  NOSTRILS 4 TIMES DAILY AS NEEDED FOR RUNNY NOSE   lisinopril (ZESTRIL) 20 MG tablet Take 1 tablet (20 mg total) by mouth daily. (Patient taking differently: Take 30 mg by mouth daily. Patient states taking 30 mg daily)   Multiple Vitamins-Minerals (MULTIVITAMIN MEN 50+ PO) Take 1 tablet by mouth daily.   pantoprazole (PROTONIX) 40 MG tablet TAKE 1 TABLET BY MOUTH  DAILY   nitroGLYCERIN (NITROSTAT) 0.4 MG SL tablet Place 1 tablet (0.4 mg total) under the tongue every 5 (five) minutes as needed for chest pain.   No facility-administered encounter medications on file as of 12/04/2021.    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/2020   Procedure: CORONARY ARTERY BYPASS GRAFTING (CABG) X 4, USING LEFT INTERNAL MAMMARY ARTERY AND RIGHT LEG GREATER SAPHENOUS VEIN HARVESTED ENDOSCOPICALLY;  Surgeon: Lajuana Matte, MD;  Location: [facility];  Service: Open Heart Surgery;  Laterality: N/A;   LAMINECTOMY  1995   L 3-T11 Harrington rods in back   LAPAROSCOPIC APPENDECTOMY N/A 05/16/2016   Procedure: APPENDECTOMY LAPAROSCOPIC;  Surgeon: Greer Pickerel, MD;  Location: [facility];  Service: General;  Laterality: N/A;   LEFT HEART CATH AND CORONARY ANGIOGRAPHY N/A 11/18/2020   Procedure: LEFT HEART CATH AND CORONARY ANGIOGRAPHY;  Surgeon: Troy Sine, MD;  Location: [facility];  Service: Cardiovascular;  Laterality: N/A;   MELANOMA EXCISION  1995   right upper abd, Dr. Donita Brooks HEAD EXCISION Right 06/23/2020   Procedure: RIGHT 5TH METATARSAL OSTEOTOMY WITH BUNIONETTE EXCISION, OPEN REDUCTION OF FIFTH METATARSAL PHALANGEAL JOINT, FIFTH METATARSAL PHALANGEAL CAPSULOTOMY, BURSECTOMY;  Surgeon: Erle Crocker, MD;  Location: [facility];  Service: Orthopedics;  Laterality: Right;  LENGTH OF SURGERY: 1.5 HOURS   ORIF ANKLE FRACTURE  1967   TEE WITHOUT CARDIOVERSION N/A 11/21/2020   Procedure: TRANSESOPHAGEAL ECHOCARDIOGRAM (TEE);  Surgeon: Lajuana Matte, MD;  Location: [facility];  Service: Open Heart Surgery;  Laterality: N/A;   Family History  Problem Relation Age of Onset   Heart disease Mother        CHF   Hypertension Mother    Diabetes Mother    Stroke Mother    Hypertension Father    Heart disease Father        CHF   Hypertension Sister    Hypertension Sister    Depression Neg Hx    Alcohol abuse Neg Hx    Drug abuse Neg Hx    Social History   Socioeconomic History   Marital status: Widowed    Spouse name: Not on file   Number of children: 2   Years of education: Not on file   Highest education level: Not on file  Occupational History    Occupation: retired    Comment: retired from [employer] 04/2002  Tobacco Use   Smoking status: Never   Smokeless tobacco: Never  Vaping Use   Vaping Use: Never used  Substance and Sexual Activity   Alcohol use: No   Drug use: No   Sexual activity: Yes    Partners: Female  Other Topics Concern   Not on file  Social History Narrative   Not on file   Social Determinants of Health   Financial Resource Strain: Low Risk    Difficulty of Paying Living Expenses: Not hard at all  Food Insecurity: No Food Insecurity   Worried About Charity fundraiser in the Last Year: Never true   [place] in the Last Year: Never true  Transportation Needs: No Transportation Needs   Lack of Transportation (Medical): No   Lack of Transportation (Non-Medical): No  Physical Activity:  Sufficiently Active   Days of Exercise per Week: 4 days   Minutes of Exercise per Session: 50 min  Stress: No Stress Concern Present   Feeling of Stress : Not at all  Social Connections: Moderately Isolated   Frequency of Communication with Friends and Family: More than three times a week   Frequency of Social Gatherings with Friends and Family: More than three times a week   Attends Religious Services: More than 4 times per year   Active Member of [employer] or Organizations: No   Attends Archivist Meetings: Never   Marital Status: Widowed    Tobacco Counseling Counseling given: Not Answered   Clinical Intake:  Pre-visit preparation completed: Yes  Pain : No/denies pain     BMI - recorded: 21.24 Nutritional Status: BMI of 19-24  Normal Nutritional Risks: None Diabetes: No  How often do you need to have someone help you when you read instructions, pamphlets, or other written materials from your doctor or pharmacy?: 1 - Never  Diabetic? No  Interpreter Needed?: No  Information entered by :: Orrin Brigham LPN   Activities of Daily Living In your present state of health, do you have  any difficulty performing the following activities: 12/04/2021  Hearing? Y  Comment patient plans on getting hearing aids  Vision? N  Difficulty concentrating or making decisions? N  Walking or climbing stairs? N  Dressing or bathing? N  Doing errands, shopping? N  Preparing Food and eating ? N  Using the Toilet? N  In the past six months, have you accidently leaked urine? N  Do you have problems with loss of bowel control? N  Managing your Medications? N  Managing your Finances? N  Housekeeping or managing your Housekeeping? N  Some recent data might be hidden    Patient Care Team: Owens Loffler, MD as PCP - General Elouise Munroe, MD as PCP - Cardiology (Cardiology) Danella Sensing, MD as Consulting Physician (Dermatology) Katy Apo, MD as Consulting Physician (Ophthalmology)  Indicate any recent Medical Services you may have received from other than [facility] providers in the past year (date may be approximate).     Assessment:   This is a routine wellness examination for Avyaan.  Hearing/Vision screen Hearing Screening - Comments:: Patient stats having issues with hearing and plans to get hearing aids. Vision Screening - Comments:: Last exam 2022, cataract surgery, Dr. Prudencio Burly   Dietary issues and exercise activities discussed: Current Exercise Habits: Home exercise routine, Type of exercise: strength training/weights (curls, squats and push-ups), Time (Minutes): 45, Frequency (Times/Week): 5, Weekly Exercise (Minutes/Week): 225, Intensity: Moderate   Goals Addressed             This Visit's Progress    Patient Stated       Would like to maintain current routine.       Depression Screen PHQ 2/9 Scores 12/04/2021 04/20/2021 11/23/2019 11/20/2018 11/20/2017 11/07/2016 11/07/2016  PHQ - 2 Score 0 0 0 0 0 0 0  PHQ- 9 Score - 0 0 0 - - -    Fall Risk Fall Risk  12/04/2021 04/20/2021 11/23/2019 11/20/2018 11/20/2017  Falls in the past year? 1 0 1 1 No  Comment - -  tripped on vine 2 falls while walking in woods -  Number falls in past yr: 0 - 1 1 -  Injury with Fall? 0 - 0 0 -  Risk for fall due to : Other (Comment) - Medication side effect - -  Risk for fall due to: Comment tripped over curb - - - -  Follow up Falls prevention discussed - Falls evaluation completed;Falls prevention discussed - -    FALL RISK PREVENTION PERTAINING TO THE HOME:  Any stairs in or around the home? No  If so, are there any without handrails? No  Home free of loose throw rugs in walkways, pet beds, electrical cords, etc? Yes  Adequate lighting in your home to reduce risk of falls? Yes   ASSISTIVE DEVICES UTILIZED TO PREVENT FALLS:  Life alert? No  Use of a cane, walker or w/c? No  Grab bars in the bathroom? Yes  Shower chair or bench in shower? Yes  Elevated toilet seat or a handicapped toilet? No   TIMED UP AND GO:  Was the test performed? No , visit completed over the phone.     Cognitive Function: Normal cognitive status assessed by this Nurse Health Advisor. No abnormalities found.   MMSE - Mini Mental State Exam 11/23/2019 11/20/2018 11/07/2016  Orientation to time 5 5 5   Orientation to Place 5 5 5   Registration 3 3 3   Attention/ Calculation 5 0 0  Recall 3 3 3   Language- name 2 objects - 0 0  Language- repeat 1 1 1   Language- follow 3 step command - 3 3  Language- read & follow direction - 0 0  Write a sentence - 0 0  Copy design - 0 0  Total score - 20 20        Immunizations Immunization History  Administered Date(s) Administered   Influenza Split 12/25/2011   Influenza, High Dose Seasonal PF 08/31/2020   Influenza,inj,Quad PF,6+ Mos 08/19/2013, 08/26/2014, 09/14/2015, 09/11/2016, 08/28/2017, 08/27/2018, 08/10/2019   PFIZER(Purple Top)SARS-COV-2 Vaccination 01/06/2020, 01/27/2020   Pneumococcal Conjugate-13 02/16/2015   Td 03/24/2002, 12/22/2009    TDAP status: Due, Education has been provided regarding the importance of this vaccine.  Advised may receive this vaccine at local pharmacy or Health Dept. Aware to provide a copy of the vaccination record if obtained from local pharmacy or Health Dept. Verbalized acceptance and understanding.  Flu Vaccine status: Up to date Patient plans on bringing up dated vaccine information to upcoming appointment.   Pneumococcal vaccine status: Up to date Patient plans on bringing up dated vaccine information to upcoming appointment.  Covid-19 vaccine status: Completed vaccines  Qualifies for Shingles Vaccine? Yes   Zostavax completed No   Shingrix Completed?: No.    Education has been provided regarding the importance of this vaccine. Patient has been advised to call insurance company to determine out of pocket expense if they have not yet received this vaccine. Advised may also receive vaccine at local pharmacy or Health Dept. Verbalized acceptance and understanding.  Screening Tests Health Maintenance  Topic Date Due   Hepatitis C Screening  Never done   Zoster Vaccines- Shingrix (1 of 2) Never done   Pneumonia Vaccine 38+ Years old (2 - PPSV23 if available, else PCV20) 02/16/2016   TETANUS/TDAP  12/23/2019   COVID-19 Vaccine (3 - Pfizer risk series) 02/24/2020   INFLUENZA VACCINE  07/17/2021   HPV VACCINES  Aged Out    Health Maintenance  Health Maintenance Due  Topic Date Due   Hepatitis C Screening  Never done   Zoster Vaccines- Shingrix (1 of 2) Never done   Pneumonia Vaccine 39+ Years old (2 - PPSV23 if available, else PCV20) 02/16/2016   TETANUS/TDAP  12/23/2019   COVID-19 Vaccine (3 - Pfizer risk series) 02/24/2020  INFLUENZA VACCINE  07/17/2021    Colorectal cancer screening: No longer required.   Lung Cancer Screening: (Low Dose CT Chest recommended if Age 38-80 years, 30 pack-year currently smoking OR have quit w/in 15years.) does not qualify.     Additional Screening:  Hepatitis C Screening: does not qualify  Vision Screening: Recommended annual  ophthalmology exams for early detection of glaucoma and other disorders of the eye. Is the patient up to date with their annual eye exam?  Yes  Who is the provider or what is the name of the office in which the patient attends annual eye exams? Provider information unavailable.   Dental Screening: Recommended annual dental exams for proper oral hygiene  Community Resource Referral / Chronic Care Management: CRR required this visit?  No   CCM required this visit?  No      Plan:     I have personally reviewed and noted the following in the patients chart:   Medical and social history Use of alcohol, tobacco or illicit drugs  Current medications and supplements including opioid prescriptions. Patient is not currently taking opioid prescriptions. Functional ability and status Nutritional status Physical activity Advanced directives List of other physicians Hospitalizations, surgeries, and ER visits in previous 12 months Vitals Screenings to include cognitive, depression, and falls Referrals and appointments  In addition, I have reviewed and discussed with patient certain preventive protocols, quality metrics, and best practice recommendations. A written personalized care plan for preventive services as well as general preventive health recommendations were provided to patient.   Due to this being a telephonic visit, the after visit summary with patients personalized plan was offered to patient via mail or my-chart.  Patient preferred to pick up at office at next visit.   Loma Messing, LPN   [id number]   Nurse Health Advisor  Nurse Notes: none

## 2021-12-01 LAB — BASIC METABOLIC PANEL
BUN/Creatinine Ratio: 16 (ref 10–24)
BUN: 17 mg/dL (ref 8–27)
CO2: 23 mmol/L (ref 20–29)
Calcium: 9.1 mg/dL (ref 8.6–10.2)
Chloride: 109 mmol/L — ABNORMAL HIGH (ref 96–106)
Creatinine, Ser: 1.08 mg/dL (ref 0.76–1.27)
Glucose: 78 mg/dL (ref 70–99)
Potassium: 4.9 mmol/L (ref 3.5–5.2)
Sodium: 146 mmol/L — ABNORMAL HIGH (ref 134–144)
eGFR: 70 mL/min/{1.73_m2} (ref >59)

## 2021-12-04 ENCOUNTER — Ambulatory Visit (INDEPENDENT_AMBULATORY_CARE_PROVIDER_SITE_OTHER): Payer: Medicare Other

## 2021-12-04 VITALS — Ht 70.0 in | Wt 148.0 lb

## 2021-12-04 DIAGNOSIS — Z0000 Encounter for general adult medical examination without abnormal findings: Secondary | ICD-10-CM

## 2021-12-04 NOTE — Patient Instructions (Signed)
[name] , Thank you for taking time to complete your Medicare Wellness Visit. I appreciate your ongoing commitment to your health goals. Please review the following plan we discussed and let me know if I can assist you in the future.   Screening recommendations/referrals: Colonoscopy: no longer required  Recommended yearly ophthalmology/optometry visit for glaucoma screening and checkup Recommended yearly dental visit for hygiene and checkup  Vaccinations: Influenza vaccine: up to date, please provide vaccine information for  Pneumococcal vaccine: up to date Tdap vaccine: Due last completed 03/24/02-May obtain vaccine at your local pharmacy. Shingles vaccine: Discuss with your local pharmacy   Covid-19:  newest booster available at your local pharmacy  Advanced directives: Please bring a copy of Living Will and/or Platinum for your chart.   Conditions/risks identified: see problem list  Next appointment: Follow up in one year for your annual wellness visit. 12/05/22 @ 1:15pm, this will be a telephone visit.   Preventive Care 35 Years and Older, Male Preventive care refers to lifestyle choices and visits with your health care provider that can promote health and wellness. What does preventive care include? A yearly physical exam. This is also called an annual well check. Dental exams once or twice a year. Routine eye exams. Ask your health care provider how often you should have your eyes checked. Personal lifestyle choices, including: Daily care of your teeth and gums. Regular physical activity. Eating a healthy diet. Avoiding tobacco and drug use. Limiting alcohol use. Practicing safe sex. Taking low doses of aspirin every day. Taking vitamin and mineral supplements as recommended by your health care provider. What happens during an annual well check? The services and screenings done by your health care provider during your annual well check will depend on  your age, overall health, lifestyle risk factors, and family history of disease. Counseling  Your health care provider may ask you questions about your: Alcohol use. Tobacco use. Drug use. Emotional well-being. Home and relationship well-being. Sexual activity. Eating habits. History of falls. Memory and ability to understand (cognition). Work and work Statistician. Screening  You may have the following tests or measurements: Height, weight, and BMI. Blood pressure. Lipid and cholesterol levels. These may be checked every 5 years, or more frequently if you are over 83 years old. Skin check. Lung cancer screening. You may have this screening every year starting at age 27 if you have a 30-pack-year history of smoking and currently smoke or have quit within the past 15 years. Fecal occult blood test (FOBT) of the stool. You may have this test every year starting at age 10. Flexible sigmoidoscopy or colonoscopy. You may have a sigmoidoscopy every 5 years or a colonoscopy every 10 years starting at age 84. Prostate cancer screening. Recommendations will vary depending on your family history and other risks. Hepatitis C blood test. Hepatitis B blood test. Sexually transmitted disease (STD) testing. Diabetes screening. This is done by checking your blood sugar (glucose) after you have not eaten for a while (fasting). You may have this done every 1-3 years. Abdominal aortic aneurysm (AAA) screening. You may need this if you are a current or former smoker. Osteoporosis. You may be screened starting at age 73 if you are at high risk. Talk with your health care provider about your test results, treatment options, and if necessary, the need for more tests. Vaccines  Your health care provider may recommend certain vaccines, such as: Influenza vaccine. This is recommended every year. Tetanus, diphtheria, and  acellular pertussis (Tdap, Td) vaccine. You may need a Td booster every 10 years. Zoster  vaccine. You may need this after age 65. Pneumococcal 13-valent conjugate (PCV13) vaccine. One dose is recommended after age 38. Pneumococcal polysaccharide (PPSV23) vaccine. One dose is recommended after age 84. Talk to your health care provider about which screenings and vaccines you need and how often you need them. This information is not intended to replace advice given to you by your health care provider. Make sure you discuss any questions you have with your health care provider. Document Released: 12/30/2015 Document Revised: 08/22/2016 Document Reviewed: 10/04/2015 Elsevier Interactive Patient Education  2017 [place] Prevention in the Home Falls can cause injuries. They can happen to people of all ages. There are many things you can do to make your home safe and to help prevent falls. What can I do on the outside of my home? Regularly fix the edges of walkways and driveways and fix any cracks. Remove anything that might make you trip as you walk through a door, such as a raised step or threshold. Trim any bushes or trees on the path to your home. Use bright outdoor lighting. Clear any walking paths of anything that might make someone trip, such as rocks or tools. Regularly check to see if handrails are loose or broken. Make sure that both sides of any steps have handrails. Any raised decks and porches should have guardrails on the edges. Have any leaves, snow, or ice cleared regularly. Use sand or salt on walking paths during winter. Clean up any spills in your garage right away. This includes oil or grease spills. What can I do in the bathroom? Use night lights. Install grab bars by the toilet and in the tub and shower. Do not use towel bars as grab bars. Use non-skid mats or decals in the tub or shower. If you need to sit down in the shower, use a plastic, non-slip stool. Keep the floor dry. Clean up any water that spills on the floor as soon as it happens. Remove  soap buildup in the tub or shower regularly. Attach bath mats securely with double-sided non-slip rug tape. Do not have throw rugs and other things on the floor that can make you trip. What can I do in the bedroom? Use night lights. Make sure that you have a light by your bed that is easy to reach. Do not use any sheets or blankets that are too big for your bed. They should not hang down onto the floor. Have a firm chair that has side arms. You can use this for support while you get dressed. Do not have throw rugs and other things on the floor that can make you trip. What can I do in the kitchen? Clean up any spills right away. Avoid walking on wet floors. Keep items that you use a lot in easy-to-reach places. If you need to reach something above you, use a strong step stool that has a grab bar. Keep electrical cords out of the way. Do not use floor polish or wax that makes floors slippery. If you must use wax, use non-skid floor wax. Do not have throw rugs and other things on the floor that can make you trip. What can I do with my stairs? Do not leave any items on the stairs. Make sure that there are handrails on both sides of the stairs and use them. Fix handrails that are broken or loose. Make sure that  handrails are as long as the stairways. Check any carpeting to make sure that it is firmly attached to the stairs. Fix any carpet that is loose or worn. Avoid having throw rugs at the top or bottom of the stairs. If you do have throw rugs, attach them to the floor with carpet tape. Make sure that you have a light switch at the top of the stairs and the bottom of the stairs. If you do not have them, ask someone to add them for you. What else can I do to help prevent falls? Wear shoes that: Do not have high heels. Have rubber bottoms. Are comfortable and fit you well. Are closed at the toe. Do not wear sandals. If you use a stepladder: Make sure that it is fully opened. Do not climb a  closed stepladder. Make sure that both sides of the stepladder are locked into place. Ask someone to hold it for you, if possible. Clearly mark and make sure that you can see: Any grab bars or handrails. First and last steps. Where the edge of each step is. Use tools that help you move around (mobility aids) if they are needed. These include: Canes. Walkers. Scooters. Crutches. Turn on the lights when you go into a dark area. Replace any light bulbs as soon as they burn out. Set up your furniture so you have a clear path. Avoid moving your furniture around. If any of your floors are uneven, fix them. If there are any pets around you, be aware of where they are. Review your medicines with your doctor. Some medicines can make you feel dizzy. This can increase your chance of falling. Ask your doctor what other things that you can do to help prevent falls. This information is not intended to replace advice given to you by your health care provider. Make sure you discuss any questions you have with your health care provider. Document Released: 09/29/2009 Document Revised: 05/10/2016 Document Reviewed: 01/07/2015 Elsevier Interactive Patient Education  2017 [employer].

## 2021-12-05 ENCOUNTER — Other Ambulatory Visit: Payer: Self-pay

## 2021-12-05 DIAGNOSIS — Z79899 Other long term (current) drug therapy: Secondary | ICD-10-CM

## 2021-12-05 MED ORDER — LISINOPRIL 40 MG PO TABS
40.0000 mg | ORAL_TABLET | Freq: Every day | ORAL | 3 refills | Status: DC
Start: 2021-12-05 — End: 2022-08-01

## 2021-12-06 ENCOUNTER — Other Ambulatory Visit: Payer: Medicare Other

## 2021-12-06 ENCOUNTER — Other Ambulatory Visit: Payer: Self-pay | Admitting: Family Medicine

## 2021-12-06 DIAGNOSIS — Z1159 Encounter for screening for other viral diseases: Secondary | ICD-10-CM

## 2021-12-06 DIAGNOSIS — Z79899 Other long term (current) drug therapy: Secondary | ICD-10-CM

## 2021-12-06 DIAGNOSIS — Z131 Encounter for screening for diabetes mellitus: Secondary | ICD-10-CM

## 2021-12-06 DIAGNOSIS — E559 Vitamin D deficiency, unspecified: Secondary | ICD-10-CM

## 2021-12-06 DIAGNOSIS — E7800 Pure hypercholesterolemia, unspecified: Secondary | ICD-10-CM

## 2021-12-13 ENCOUNTER — Ambulatory Visit: Payer: Medicare Other | Admitting: Family Medicine

## 2021-12-20 ENCOUNTER — Other Ambulatory Visit: Payer: Self-pay

## 2021-12-20 ENCOUNTER — Encounter: Payer: Self-pay | Admitting: Family Medicine

## 2021-12-20 ENCOUNTER — Ambulatory Visit (INDEPENDENT_AMBULATORY_CARE_PROVIDER_SITE_OTHER): Payer: Medicare Other | Admitting: Family Medicine

## 2021-12-20 VITALS — BP 130/64 | HR 63 | Temp 98.9°F | Ht 67.0 in | Wt 148.2 lb

## 2021-12-20 DIAGNOSIS — Z1159 Encounter for screening for other viral diseases: Secondary | ICD-10-CM | POA: Diagnosis not present

## 2021-12-20 DIAGNOSIS — E559 Vitamin D deficiency, unspecified: Secondary | ICD-10-CM | POA: Diagnosis not present

## 2021-12-20 DIAGNOSIS — Z131 Encounter for screening for diabetes mellitus: Secondary | ICD-10-CM | POA: Diagnosis not present

## 2021-12-20 DIAGNOSIS — E7800 Pure hypercholesterolemia, unspecified: Secondary | ICD-10-CM

## 2021-12-20 DIAGNOSIS — Z79899 Other long term (current) drug therapy: Secondary | ICD-10-CM | POA: Diagnosis not present

## 2021-12-20 DIAGNOSIS — Z0001 Encounter for general adult medical examination with abnormal findings: Secondary | ICD-10-CM

## 2021-12-20 LAB — LIPID PANEL
Cholesterol: 105 mg/dL (ref 0–200)
HDL: 39.9 mg/dL (ref >39.00)
LDL Cholesterol: 53 mg/dL (ref 0–99)
NonHDL: 64.81
Total CHOL/HDL Ratio: 3
Triglycerides: 57 mg/dL (ref 0.0–149.0)
VLDL: 11.4 mg/dL (ref 0.0–40.0)

## 2021-12-20 LAB — CBC WITH DIFFERENTIAL/PLATELET
Basophils Absolute: 0 10*3/uL (ref 0.0–0.1)
Basophils Relative: 0.6 % (ref 0.0–3.0)
Eosinophils Absolute: 0.2 10*3/uL (ref 0.0–0.7)
Eosinophils Relative: 3.1 % (ref 0.0–5.0)
HCT: 42.3 % (ref 39.0–52.0)
Hemoglobin: 14.1 g/dL (ref 13.0–17.0)
Lymphocytes Relative: 28.2 % (ref 12.0–46.0)
Lymphs Abs: 2 10*3/uL (ref 0.7–4.0)
MCHC: 33.3 g/dL (ref 30.0–36.0)
MCV: 98.3 fl (ref 78.0–100.0)
Monocytes Absolute: 0.5 10*3/uL (ref 0.1–1.0)
Monocytes Relative: 7.1 % (ref 3.0–12.0)
Neutro Abs: 4.3 10*3/uL (ref 1.4–7.7)
Neutrophils Relative %: 61 % (ref 43.0–77.0)
Platelets: 162 10*3/uL (ref 150.0–400.0)
RBC: 4.31 Mil/uL (ref 4.22–5.81)
RDW: 12.7 % (ref 11.5–15.5)
WBC: 7 10*3/uL (ref 4.0–10.5)

## 2021-12-20 LAB — BASIC METABOLIC PANEL
BUN: 18 mg/dL (ref 6–23)
CO2: 30 mEq/L (ref 19–32)
Calcium: 9.1 mg/dL (ref 8.4–10.5)
Chloride: 106 mEq/L (ref 96–112)
Creatinine, Ser: 1.06 mg/dL (ref 0.40–1.50)
GFR: 66.69 mL/min (ref >60.00)
Glucose, Bld: 74 mg/dL (ref 70–99)
Potassium: 4.9 mEq/L (ref 3.5–5.1)
Sodium: 142 mEq/L (ref 135–145)

## 2021-12-20 LAB — HEPATIC FUNCTION PANEL
ALT: 15 U/L (ref 0–53)
AST: 19 U/L (ref 0–37)
Albumin: 3.9 g/dL (ref 3.5–5.2)
Alkaline Phosphatase: 57 U/L (ref 39–117)
Bilirubin, Direct: 0.2 mg/dL (ref 0.0–0.3)
Total Bilirubin: 0.8 mg/dL (ref 0.2–1.2)
Total Protein: 6.2 g/dL (ref 6.0–8.3)

## 2021-12-20 LAB — HEMOGLOBIN A1C: Hgb A1c MFr Bld: 5.2 % (ref 4.6–6.5)

## 2021-12-20 LAB — VITAMIN D 25 HYDROXY (VIT D DEFICIENCY, FRACTURES): VITD: 59.3 ng/mL (ref 30.00–100.00)

## 2021-12-20 NOTE — Progress Notes (Signed)
Kollins Fenter T. Xochilth Standish, MD, [place] at [facility] [place], [address]  Phone: [phone]   FAX: [phone]  JB DULWORTH - 81 y.o. male   MRN [id number]   Date of Birth: May 26, 1941  Date: 12/20/2021   PCP: Owens Loffler, MD   Referral: Owens Loffler, MD  Chief Complaint  Patient presents with   Annual Exam    Part 2    This visit occurred during the SARS-CoV-2 public health emergency.  Safety protocols were in place, including screening questions prior to the visit, additional usage of staff PPE, and extensive cleaning of exam room while observing appropriate contact time as indicated for disinfecting solutions.   Patient Care Team: Owens Loffler, MD as PCP - General Elouise Munroe, MD as PCP - Cardiology (Cardiology) Danella Sensing, MD as Consulting Physician (Dermatology) Katy Apo, MD as Consulting Physician (Ophthalmology) Subjective:   DAMAN STEFFENHAGEN is a 81 y.o. pleasant patient who presents with the following:  Preventative Health Maintenance Visit:  Health Maintenance Summary Reviewed and updated, unless pt declines services.  Tobacco History Reviewed. Alcohol: No concerns, no excessive use Exercise Habits: Some activity, rec at least 30 mins 5 times a week STD concerns: no risk or activity to increase risk Drug Use: None  Shingrix Tdap Covid bivalent booster - done  Exercise as much as he can. Some arms and squatting.  This basically consists of some calisthenics.  Health Maintenance  Topic Date Due   Zoster Vaccines- Shingrix (1 of 2) Never done   Pneumonia Vaccine 18+ Years old (2 - PPSV23 if available, else PCV20) 02/16/2016   TETANUS/TDAP  12/23/2019   COVID-19 Vaccine (4 - Booster for Pfizer series) 11/02/2021   INFLUENZA VACCINE  Completed   Hepatitis C Screening  Completed   HPV VACCINES  Aged Out   Immunization History  Administered Date(s) Administered    Influenza Split 12/25/2011   Influenza, High Dose Seasonal PF 08/31/2020, 09/06/2021   Influenza,inj,Quad PF,6+ Mos 08/19/2013, 08/26/2014, 09/14/2015, 09/11/2016, 08/28/2017, 08/27/2018, 08/10/2019   PFIZER(Purple Top)SARS-COV-2 Vaccination 01/06/2020, 01/27/2020   Pfizer Covid-19 Vaccine Bivalent Booster 46yrs & up 09/07/2021   Pneumococcal Conjugate-13 02/16/2015   Td 03/24/2002, 12/22/2009   Patient Active Problem List   Diagnosis Date Noted   CAD (coronary artery disease), native coronary artery 11/18/2020    Priority: 13.   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   Coronary artery disease of native artery of native heart with stable angina pectoris ([place])     Priority: 14.   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   Benign neoplasm of colon    CAD (coronary artery disease), native coronary artery 11/18/2020   ED (erectile dysfunction)    IBS (irritable bowel syndrome)    Melanoma ([place])    history of   Other and unspecified hyperlipidemia    Other chronic nonalcoholic liver disease    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   Unspecified essential hypertension    Unspecified vitamin D deficiency     Past Surgical History:  Procedure Laterality Date  AMPUTATION     distal phalanx of right thumb- traumatic   CHOLECYSTECTOMY  10/03/09   lap   CORONARY ARTERY BYPASS GRAFT N/A 11/21/2020   Procedure: CORONARY ARTERY BYPASS GRAFTING (CABG) X 4, USING LEFT INTERNAL MAMMARY ARTERY AND RIGHT LEG GREATER SAPHENOUS VEIN HARVESTED ENDOSCOPICALLY;  Surgeon: Lajuana Matte, MD;  Location: [facility];  Service: Open Heart  Surgery;  Laterality: N/A;   LAMINECTOMY  1995   L 3-T11 Harrington rods in back   LAPAROSCOPIC APPENDECTOMY N/A 05/16/2016   Procedure: APPENDECTOMY LAPAROSCOPIC;  Surgeon: Greer Pickerel, MD;  Location: [place];  Service: General;  Laterality: N/A;   LEFT HEART CATH AND CORONARY ANGIOGRAPHY N/A 11/18/2020   Procedure: LEFT HEART CATH AND CORONARY ANGIOGRAPHY;  Surgeon: Troy Sine, MD;  Location: [facility];  Service: Cardiovascular;  Laterality: N/A;   MELANOMA EXCISION  1995   right upper abd, Dr. Donita Brooks HEAD EXCISION Right 06/23/2020   Procedure: RIGHT 5TH METATARSAL OSTEOTOMY WITH BUNIONETTE EXCISION, OPEN REDUCTION OF FIFTH METATARSAL PHALANGEAL JOINT, FIFTH METATARSAL PHALANGEAL CAPSULOTOMY, BURSECTOMY;  Surgeon: Erle Crocker, MD;  Location: [facility];  Service: Orthopedics;  Laterality: Right;  LENGTH OF SURGERY: 1.5 HOURS   ORIF ANKLE FRACTURE  1967   TEE WITHOUT CARDIOVERSION N/A 11/21/2020   Procedure: TRANSESOPHAGEAL ECHOCARDIOGRAM (TEE);  Surgeon: Lajuana Matte, MD;  Location: [facility];  Service: Open Heart Surgery;  Laterality: N/A;    Family History  Problem Relation Age of Onset   Heart disease Mother        CHF   Hypertension Mother    Diabetes Mother    Stroke Mother    Hypertension Father    Heart disease Father        CHF   Hypertension Sister    Hypertension Sister    Depression Neg Hx    Alcohol abuse Neg Hx    Drug abuse Neg Hx     Past Medical History, Surgical History, Social History, Family History, Problem List, Medications, and Allergies have been reviewed and updated if relevant.  Review of Systems: Pertinent positives are listed above.  Otherwise, a full 14 point review of systems has been done in full and it is negative except where it is noted positive.  Objective:   BP 130/64    Pulse 63    Temp 98.9 F (37.2 C) (Temporal)    Ht 5\' 7"  (1.702 m)    Wt 148 lb 4 oz (67.2 kg)    SpO2 98%    BMI 23.22 kg/m   Ideal Body Weight: Weight in (lb) to have BMI = 25: 159.3  Ideal Body Weight: Weight in (lb) to have BMI = 25: 159.3 No results found. Depression screen [facility] 2/9 12/04/2021 04/20/2021 11/23/2019 11/20/2018 11/20/2017  Decreased Interest 0 0 0 0 0  Down, Depressed, Hopeless 0 0 0 0 0  PHQ - 2 Score 0 0 0 0 0  Altered sleeping - 0 0 0 -  Tired, decreased energy - 0 0 0 -  Change in appetite - 0 0 0 -  Feeling bad or failure about yourself  - 0 0 0 -  Trouble concentrating - 0 0 0 -  Moving slowly or fidgety/restless - 0 0 0 -  Suicidal thoughts - 0 0 0 -  PHQ-9 Score - 0 0 0 -  Difficult doing work/chores - Not difficult at all Not difficult at all Not difficult at all -  GEN: well developed, well nourished, no acute distress Eyes: conjunctiva and lids normal, PERRLA, EOMI ENT: TM clear, nares clear, oral exam WNL Neck: supple, no lymphadenopathy, no thyromegaly, no JVD Pulm: clear to auscultation and percussion, respiratory effort normal CV: regular rate and rhythm, S1-S2, no murmur, rub or gallop, no bruits, peripheral pulses normal and symmetric, no cyanosis, clubbing, edema or varicosities GI: soft, non-tender; no hepatosplenomegaly, masses; active bowel sounds all quadrants GU: deferred Lymph: no cervical, axillary or inguinal adenopathy MSK: gait normal, muscle tone and strength WNL, no joint swelling, effusions, discoloration, crepitus  SKIN: clear, good turgor, color WNL, no rashes, lesions, or ulcerations Neuro: normal mental status, normal strength, sensation, and motion Psych: alert; oriented to person, place and time, normally interactive and not anxious or depressed in appearance.  All labs reviewed with patient. Results for orders placed or performed in visit on 12/20/21  VITAMIN D 25 Hydroxy (Vit-D Deficiency, Fractures)  Result Value Ref Range   VITD 59.30 30.00 - 100.00 ng/mL  Lipid panel  Result Value Ref Range   Cholesterol 105 0 - 200 mg/dL   Triglycerides  57.0 0.0 - 149.0 mg/dL   HDL 39.90 >39.00 mg/dL   VLDL 11.4 0.0 - 40.0 mg/dL   LDL Cholesterol 53 0 - 99 mg/dL   Total CHOL/HDL Ratio 3    NonHDL 64.81   Hepatitis C antibody  Result Value Ref Range   Hepatitis C Ab NON-REACTIVE NON-REACTIVE   SIGNAL TO CUT-OFF 0.03 <1.00  Hepatic function panel  Result Value Ref Range   Total Bilirubin 0.8 0.2 - 1.2 mg/dL   Bilirubin, Direct 0.2 0.0 - 0.3 mg/dL   Alkaline Phosphatase 57 39 - 117 U/L   AST 19 0 - 37 U/L   ALT 15 0 - 53 U/L   Total Protein 6.2 6.0 - 8.3 g/dL   Albumin 3.9 3.5 - 5.2 g/dL  Hemoglobin A1c  Result Value Ref Range   Hgb A1c MFr Bld 5.2 4.6 - 6.5 %  CBC with Differential/Platelet  Result Value Ref Range   WBC 7.0 4.0 - 10.5 K/uL   RBC 4.31 4.22 - 5.81 Mil/uL   Hemoglobin 14.1 13.0 - 17.0 g/dL   HCT 42.3 39.0 - 52.0 %   MCV 98.3 78.0 - 100.0 fl   MCHC 33.3 30.0 - 36.0 g/dL   RDW 12.7 11.5 - 15.5 %   Platelets 162.0 150.0 - 400.0 K/uL   Neutrophils Relative % 61.0 43.0 - 77.0 %   Lymphocytes Relative 28.2 12.0 - 46.0 %   Monocytes Relative 7.1 3.0 - 12.0 %   Eosinophils Relative 3.1 0.0 - 5.0 %   Basophils Relative 0.6 0.0 - 3.0 %   Neutro Abs 4.3 1.4 - 7.7 K/uL   Lymphs Abs 2.0 0.7 - 4.0 K/uL   Monocytes Absolute 0.5 0.1 - 1.0 K/uL   Eosinophils Absolute 0.2 0.0 - 0.7 K/uL   Basophils Absolute 0.0 0.0 - 0.1 K/uL  Basic metabolic panel  Result Value Ref Range   Sodium 142 135 - 145 mEq/L   Potassium 4.9 3.5 - 5.1 mEq/L   Chloride 106 96 - 112 mEq/L   CO2 30 19 - 32 mEq/L   Glucose, Bld 74 70 - 99 mg/dL   BUN 18 6 - 23 mg/dL   Creatinine, Ser 1.06 0.40 - 1.50 mg/dL   GFR 66.69 >60.00 mL/min   Calcium 9.1 8.4 - 10.5 mg/dL    Assessment and Plan:     ICD-10-CM  1. Encounter for health maintenance examination with abnormal findings  Z00.01     2. Vitamin D deficiency  E55.9 VITAMIN D 25 Hydroxy (Vit-D Deficiency, Fractures)    3. Pure hypercholesterolemia  E78.00 Lipid panel    4. Need for  hepatitis C screening test  Z11.59 Hepatitis C antibody    5. Encounter for long-term (current) use of medications  Z79.899 Hepatic function panel    CBC with Differential/Platelet    Basic metabolic panel    6. Screening for diabetes mellitus  Z13.1 Hemoglobin A1c     Basically he was doing fairly well after his fairly recent CABG. He is still not fully up to speed and hunting where he would like.  We will check all labs.  At this point to have returned, and there grossly unremarkable. I would not make any kind of medication changes at all right now.  We did review labs, and he is going to update them with the exception of his Pneumovax vaccine, he did have a fairly profound vaccine reaction before.  Patient Instructions  Get a new Tetanus shot, but you have to get it at the pharmacy   Check on the Shingles vaccine   Health Maintenance Exam: The patient's preventative maintenance and recommended screening tests for an annual wellness exam were reviewed in full today. Brought up to date unless services declined.  Counselled on the importance of diet, exercise, and its role in overall health and mortality. The patient's FH and SH was reviewed, including their home life, tobacco status, and drug and alcohol status.  Follow-up in 1 year for physical exam or additional follow-up below.  Follow-up: Return in about 1 year (around 12/20/2022) for Dr. Lorelei Pont, general wellness exam. Or follow-up in 1 year if not noted.  No orders of the defined types were placed in this encounter.  There are no discontinued medications. No orders of the defined types were placed in this encounter.   Signed,  Maud Deed. Bindu Docter, MD   Allergies as of 12/20/2021       Reactions   Hydrocodone    pts states made him sick.   Pneumococcal Vaccine    REACTION: significant arm swelling, redness        Medication List        Accurate as of December 20, 2021 11:59 PM. If you have any questions, ask  your nurse or doctor.          acetaminophen 500 MG tablet Commonly known as: TYLENOL Take 1,000 mg by mouth every 6 (six) hours as needed for mild pain or headache.   aspirin EC 81 MG tablet Take 1 tablet (81 mg total) by mouth daily.   atorvastatin 80 MG tablet Commonly known as: LIPITOR Take 1 tablet (80 mg total) by mouth every evening.   fluticasone 50 MCG/ACT nasal spray Commonly known as: FLONASE USE 2 SPRAYS IN BOTH  NOSTRILS ONCE DAILY AS  NEEDED   ipratropium 0.06 % nasal spray Commonly known as: ATROVENT INSTILL 2 SPRAYS IN BOTH  NOSTRILS 4 TIMES DAILY AS NEEDED FOR RUNNY NOSE   lisinopril 40 MG tablet Commonly known as: ZESTRIL Take 1 tablet (40 mg total) by mouth daily.   MULTIVITAMIN MEN 50+ PO Take 1 tablet by mouth daily.   nitroGLYCERIN 0.4 MG SL tablet Commonly known as: NITROSTAT Place 1 tablet (0.4 mg total) under the tongue every 5 (five) minutes as needed for chest pain.   pantoprazole 40 MG tablet Commonly known as:  PROTONIX TAKE 1 TABLET BY MOUTH  DAILY   Vitamin D3/Calcium/Phosphorus 120-100-78 UNIT-MG Tabs Take 1 tablet by mouth.

## 2021-12-20 NOTE — Patient Instructions (Addendum)
Get a new Tetanus shot, but you have to get it at the pharmacy   Check on the Shingles vaccine

## 2021-12-21 LAB — HEPATITIS C ANTIBODY
Hepatitis C Ab: NONREACTIVE
SIGNAL TO CUT-OFF: 0.03 (ref <1.00)

## 2022-01-20 ENCOUNTER — Other Ambulatory Visit: Payer: Self-pay | Admitting: Internal Medicine

## 2022-01-22 ENCOUNTER — Encounter: Payer: Self-pay | Admitting: Family Medicine

## 2022-01-22 ENCOUNTER — Other Ambulatory Visit: Payer: Self-pay

## 2022-01-22 ENCOUNTER — Ambulatory Visit (INDEPENDENT_AMBULATORY_CARE_PROVIDER_SITE_OTHER): Payer: Medicare Other | Admitting: Family Medicine

## 2022-01-22 VITALS — BP 130/80 | HR 61 | Temp 98.7°F | Ht 67.0 in | Wt 152.1 lb

## 2022-01-22 DIAGNOSIS — J208 Acute bronchitis due to other specified organisms: Secondary | ICD-10-CM | POA: Diagnosis not present

## 2022-01-22 MED ORDER — DOXYCYCLINE HYCLATE 100 MG PO TABS: 100.0000 mg | ORAL_TABLET | Freq: Two times a day (BID) | ORAL | 0 refills | Status: DC

## 2022-01-22 NOTE — Progress Notes (Signed)
Bowman Higbie T. Nil Bolser, MD, [place] at [facility] [place], [address]  Phone: [phone]   FAX: [phone]  Eddie Fox - 81 y.o. male   MRN [id number]   Date of Birth: 07-21-1941  Date: 01/22/2022   PCP: Owens Loffler, MD   Referral: Owens Loffler, MD  Chief Complaint  Patient presents with   Cough    X 2 weeks   Nasal Congestion    This visit occurred during the SARS-CoV-2 public health emergency.  Safety protocols were in place, including screening questions prior to the visit, additional usage of staff PPE, and extensive cleaning of exam room while observing appropriate contact time as indicated for disinfecting solutions.   Subjective:   Eddie Fox is a 81 y.o. very pleasant male patient with Body mass index is 23.83 kg/m. who presents with the following:  Who presents with 2 weeks of significant cough productive of sputum.  He also has a lot of rhinosinusitis symptoms with runny nose congestion and sinus pressure.  This has been persistent without relent.  At the time of initial onset no polyarthralgia, myalgia, and no fever at all.  He has not had any GI symptoms.  He did not check a COVID test.  He has taken some NyQuil and other cough and cold remedies.  Nose is running, coughing a lot Brought in some cough drops, and it then  At night, it is really bad at night.     Health Maintenance  Topic Date Due   Zoster Vaccines- Shingrix (1 of 2) Never done   Pneumonia Vaccine 80+ Years old (2 - PPSV23 if available, else PCV20) 02/16/2016   TETANUS/TDAP  12/23/2019   COVID-19 Vaccine (4 - Booster for Pfizer series) 11/02/2021   INFLUENZA VACCINE  Completed   Hepatitis C Screening  Completed   HPV VACCINES  Aged Out     Immunization History  Administered Date(s) Administered   Influenza Split 12/25/2011   Influenza, High Dose Seasonal PF 08/31/2020, 09/06/2021   Influenza,inj,Quad  PF,6+ Mos 08/19/2013, 08/26/2014, 09/14/2015, 09/11/2016, 08/28/2017, 08/27/2018, 08/10/2019   PFIZER(Purple Top)SARS-COV-2 Vaccination 01/06/2020, 01/27/2020   Pfizer Covid-19 Vaccine Bivalent Booster 26yrs & up 09/07/2021   Pneumococcal Conjugate-13 02/16/2015   Td 03/24/2002, 12/22/2009     Review of Systems is noted in the HPI, as appropriate  Objective:   BP 130/80    Pulse 61    Temp 98.7 F (37.1 C) (Temporal)    Ht 5\' 7"  (1.702 m)    Wt 152 lb 2 oz (69 kg)    SpO2 98%    BMI 23.83 kg/m   GEN: No acute distress; alert,appropriate. PULM: Breathing comfortably in no respiratory distress PSYCH: Normally interactive.  CV: RRR, no m/g/r  PULM: Normal respiratory rate, no accessory muscle use. No wheezes, crackles or rhonchi   Laboratory and Imaging Data:  Assessment and Plan:     ICD-10-CM   1. Acute bronchitis due to other specified organisms  J20.8      14 days.  Cannot exclude prior infection such as COVID-19 with postinfectious cough.  Less likely prior influenza.  Continue supportive care.  Meds ordered this encounter  Medications   doxycycline (VIBRA-TABS) 100 MG tablet    Sig: Take 1 tablet (100 mg total) by mouth 2 (two) times daily.    Dispense:  20 tablet    Refill:  0   There are no discontinued medications. No orders of  the defined types were placed in this encounter.   Follow-up: No follow-ups on file.  Dragon [employer] One speech-to-text software was used for transcription in this dictation.  Possible transcriptional errors can occur using Editor, commissioning.   Signed,  Maud Deed. Stacey Maura, MD   Outpatient Encounter Medications as of 01/22/2022  Medication Sig   acetaminophen (TYLENOL) 500 MG tablet Take 1,000 mg by mouth every 6 (six) hours as needed for mild pain or headache.   aspirin 81 MG tablet Take 1 tablet (81 mg total) by mouth daily.   atorvastatin (LIPITOR) 80 MG tablet Take 1 tablet (80 mg total) by mouth every evening.    Calcium-Phosphorus-Vitamin D (VITAMIN D3/CALCIUM/PHOSPHORUS) 120-100-78 UNIT-MG TABS Take 1 tablet by mouth.   doxycycline (VIBRA-TABS) 100 MG tablet Take 1 tablet (100 mg total) by mouth 2 (two) times daily.   fluticasone (FLONASE) 50 MCG/ACT nasal spray USE 2 SPRAYS IN BOTH  NOSTRILS ONCE DAILY AS  NEEDED   ipratropium (ATROVENT) 0.06 % nasal spray INSTILL 2 SPRAYS IN BOTH  NOSTRILS 4 TIMES DAILY AS NEEDED FOR RUNNY NOSE   lisinopril (ZESTRIL) 40 MG tablet Take 1 tablet (40 mg total) by mouth daily.   Multiple Vitamins-Minerals (MULTIVITAMIN MEN 50+ PO) Take 1 tablet by mouth daily.   pantoprazole (PROTONIX) 40 MG tablet TAKE 1 TABLET BY MOUTH  DAILY   nitroGLYCERIN (NITROSTAT) 0.4 MG SL tablet Place 1 tablet (0.4 mg total) under the tongue every 5 (five) minutes as needed for chest pain.   No facility-administered encounter medications on file as of 01/22/2022.

## 2022-02-07 ENCOUNTER — Telehealth: Payer: Self-pay | Admitting: Family Medicine

## 2022-02-07 DIAGNOSIS — M25551 Pain in right hip: Secondary | ICD-10-CM

## 2022-02-07 NOTE — Telephone Encounter (Signed)
Pt wanted a referral place to an orthopedic surgeon for his hip pain. Pt didn't want to schedule an appt since this has been an ongoing issue that Dr. Lorelei Pont knows about

## 2022-02-08 ENCOUNTER — Other Ambulatory Visit: Payer: Self-pay | Admitting: Family Medicine

## 2022-02-09 NOTE — Telephone Encounter (Signed)
done

## 2022-02-09 NOTE — Addendum Note (Signed)
Addended by: Owens Loffler on: 02/09/2022 11:31 AM   Modules accepted: Orders

## 2022-02-22 DIAGNOSIS — M7061 Trochanteric bursitis, right hip: Secondary | ICD-10-CM | POA: Diagnosis not present

## 2022-03-12 ENCOUNTER — Telehealth: Payer: Self-pay | Admitting: Internal Medicine

## 2022-03-12 ENCOUNTER — Other Ambulatory Visit: Payer: Self-pay

## 2022-03-12 MED ORDER — ATORVASTATIN CALCIUM 80 MG PO TABS: 80.0000 mg | ORAL_TABLET | Freq: Every evening | ORAL | 3 refills | Status: DC

## 2022-03-12 NOTE — Telephone Encounter (Signed)
Pt c/o medication issue: ? ?1. Name of Medication: simvastatin (ZOCOR) 40 MG tablet  ? ?2. How are you currently taking this medication (dosage and times per day)? Patient not taking ? ?3. Are you having a reaction (difficulty breathing--STAT)?  ? ?4. What is your medication issue? Patient is out of his atorvastatin (LIPITOR) 80 MG tablet) . He still has some Simvastatin and was wondering if it would be OK for him to take two of the old pills while he waits for his atorvastatin (LIPITOR) 80 MG tablet) to come in the mail. Please advise  ?

## 2022-03-12 NOTE — Telephone Encounter (Signed)
?*  STAT* If patient is at the pharmacy, call can be transferred to refill team. ? ? ?1. Which medications need to be refilled? (please list name of each medication and dose if known) atorvastatin (LIPITOR) 80 MG tablet ? ?2. Which pharmacy/location (including street and city if local pharmacy) is medication to be sent to? ?[facility] ([place], [place] ? ?3. Do they need a 30 day or 90 day supply? 90 with refills ? ? ?Patient is out of medication   ?

## 2022-03-12 NOTE — Telephone Encounter (Signed)
Pt advised that it is best not to start an old med even though it is in the same class of med it is still different... he says he will be out a week... I offered to send in a supply to a local pharmacy but he declined and says he would rather go without until he gets from the mail order pharmacy.. he will contact them to see if they can send out any sooner... he will continue to watch his diet as well.  ?

## 2022-03-21 ENCOUNTER — Other Ambulatory Visit: Payer: Self-pay | Admitting: Family Medicine

## 2022-04-17 DIAGNOSIS — M25551 Pain in right hip: Secondary | ICD-10-CM | POA: Diagnosis not present

## 2022-04-25 DIAGNOSIS — M6281 Muscle weakness (generalized): Secondary | ICD-10-CM | POA: Diagnosis not present

## 2022-04-25 DIAGNOSIS — M7061 Trochanteric bursitis, right hip: Secondary | ICD-10-CM | POA: Diagnosis not present

## 2022-05-01 DIAGNOSIS — M6281 Muscle weakness (generalized): Secondary | ICD-10-CM | POA: Diagnosis not present

## 2022-05-01 DIAGNOSIS — M7061 Trochanteric bursitis, right hip: Secondary | ICD-10-CM | POA: Diagnosis not present

## 2022-05-02 ENCOUNTER — Other Ambulatory Visit: Payer: Self-pay | Admitting: Family Medicine

## 2022-05-04 DIAGNOSIS — M7061 Trochanteric bursitis, right hip: Secondary | ICD-10-CM | POA: Diagnosis not present

## 2022-05-04 DIAGNOSIS — M6281 Muscle weakness (generalized): Secondary | ICD-10-CM | POA: Diagnosis not present

## 2022-05-08 DIAGNOSIS — M6281 Muscle weakness (generalized): Secondary | ICD-10-CM | POA: Diagnosis not present

## 2022-05-08 DIAGNOSIS — M7061 Trochanteric bursitis, right hip: Secondary | ICD-10-CM | POA: Diagnosis not present

## 2022-05-11 DIAGNOSIS — M6281 Muscle weakness (generalized): Secondary | ICD-10-CM | POA: Diagnosis not present

## 2022-05-11 DIAGNOSIS — M7061 Trochanteric bursitis, right hip: Secondary | ICD-10-CM | POA: Diagnosis not present

## 2022-05-15 DIAGNOSIS — M6281 Muscle weakness (generalized): Secondary | ICD-10-CM | POA: Diagnosis not present

## 2022-05-15 DIAGNOSIS — M7061 Trochanteric bursitis, right hip: Secondary | ICD-10-CM | POA: Diagnosis not present

## 2022-05-22 DIAGNOSIS — M6281 Muscle weakness (generalized): Secondary | ICD-10-CM | POA: Diagnosis not present

## 2022-05-22 DIAGNOSIS — M7061 Trochanteric bursitis, right hip: Secondary | ICD-10-CM | POA: Diagnosis not present

## 2022-05-23 ENCOUNTER — Ambulatory Visit: Payer: Medicare Other | Admitting: Internal Medicine

## 2022-05-25 DIAGNOSIS — M6281 Muscle weakness (generalized): Secondary | ICD-10-CM | POA: Diagnosis not present

## 2022-05-25 DIAGNOSIS — M7061 Trochanteric bursitis, right hip: Secondary | ICD-10-CM | POA: Diagnosis not present

## 2022-05-29 DIAGNOSIS — M6281 Muscle weakness (generalized): Secondary | ICD-10-CM | POA: Diagnosis not present

## 2022-05-29 DIAGNOSIS — M7061 Trochanteric bursitis, right hip: Secondary | ICD-10-CM | POA: Diagnosis not present

## 2022-06-13 ENCOUNTER — Encounter: Payer: Self-pay | Admitting: Family Medicine

## 2022-06-13 DIAGNOSIS — C44619 Basal cell carcinoma of skin of left upper limb, including shoulder: Secondary | ICD-10-CM | POA: Diagnosis not present

## 2022-06-13 DIAGNOSIS — C4441 Basal cell carcinoma of skin of scalp and neck: Secondary | ICD-10-CM | POA: Diagnosis not present

## 2022-06-13 DIAGNOSIS — L570 Actinic keratosis: Secondary | ICD-10-CM | POA: Diagnosis not present

## 2022-06-28 ENCOUNTER — Encounter: Payer: Self-pay | Admitting: Family Medicine

## 2022-06-28 ENCOUNTER — Ambulatory Visit (INDEPENDENT_AMBULATORY_CARE_PROVIDER_SITE_OTHER): Payer: Medicare Other | Admitting: Family Medicine

## 2022-06-28 VITALS — BP 140/60 | HR 57 | Temp 98.5°F | Ht 67.0 in | Wt 152.4 lb

## 2022-06-28 DIAGNOSIS — Z23 Encounter for immunization: Secondary | ICD-10-CM | POA: Diagnosis not present

## 2022-06-28 DIAGNOSIS — T8069XA Other serum reaction due to other serum, initial encounter: Secondary | ICD-10-CM

## 2022-06-28 MED ORDER — DIPHENHYDRAMINE HCL 12.5 MG/5ML PO ELIX
25.0000 mg | ORAL_SOLUTION | Freq: Once | ORAL | Status: AC
  Administered 2022-06-28: 25 mg via ORAL

## 2022-06-28 NOTE — Progress Notes (Signed)
Here for a nursing visit Pneumovax-23, pretreated with benadryl 25 mg.    ICD-10-CM   1. Allergic reaction to vaccine  T80.69XA diphenhydrAMINE (BENADRYL) 12.5 MG/5ML elixir 25 mg    2. Need for 23-polyvalent pneumococcal polysaccharide vaccine  Z23 Pneumococcal polysaccharide vaccine 23-valent greater than or equal to 80yo subcutaneous/IM      Medication Management during today's office visit: Meds ordered this encounter  Medications   diphenhydrAMINE (BENADRYL) 12.5 MG/5ML elixir 25 mg     Orders placed today for conditions managed today: Orders Placed This Encounter  Procedures   Pneumococcal polysaccharide vaccine 23-valent greater than or equal to 80yo subcutaneous/IM

## 2022-08-01 ENCOUNTER — Other Ambulatory Visit: Payer: Self-pay | Admitting: Student

## 2022-08-23 ENCOUNTER — Encounter: Payer: Self-pay | Admitting: Family Medicine

## 2022-08-23 ENCOUNTER — Ambulatory Visit (INDEPENDENT_AMBULATORY_CARE_PROVIDER_SITE_OTHER): Payer: Medicare Other | Admitting: Family Medicine

## 2022-08-23 VITALS — BP 140/60 | HR 66 | Temp 98.2°F | Ht 67.0 in | Wt 151.4 lb

## 2022-08-23 DIAGNOSIS — L84 Corns and callosities: Secondary | ICD-10-CM | POA: Diagnosis not present

## 2022-08-23 NOTE — Patient Instructions (Signed)
Get either  Urea Uric acid Salicyclic acid  Put on twice a day

## 2022-08-23 NOTE — Progress Notes (Signed)
    Eddie Kuechle T. Rashae Rother, MD, [place] at [facility] [place], [address]  Phone: [phone]  FAX: [phone]  Eddie Fox - 81 y.o. male  MRN [id number]  Date of Birth: [birth year]  Date: 08/23/2022  PCP: Owens Loffler, MD  Referral: Owens Loffler, MD  Chief Complaint  Patient presents with   Callouses    Right Foot   Subjective:   Eddie Fox is a 81 y.o. very pleasant male patient with Body mass index is 23.71 kg/m. who presents with the following:  Getting recently ready for bow season.   Debride callous 4th and 5th on the R, painful MT heads  Procedure: Indication: Callus x2, right metatarsal heads Patient has 2 painful calluses on the base of the foot at the metatarsal heads in the region of the fourth and fifth metatarsal heads.  There is a large, diffuse callus and corn formation.  He requests removal in the office.  He is prepped with ChloraPrep, and a #15 scalpel is used to debride the tissue until soft, viable tissue is visualized and palpated.  Minimal bleeding.  Tolerated the procedure well.    ICD-10-CM   1. Callus of foot  L84      Dragon [employer] One speech-to-text software was used for transcription in this dictation.  Possible transcriptional errors can occur using Editor, commissioning.   Signed,  Maud Deed. Ananiah Maciolek, MD

## 2022-09-03 ENCOUNTER — Ambulatory Visit: Payer: Medicare Other | Admitting: Internal Medicine

## 2022-09-16 NOTE — Progress Notes (Unsigned)
Cardiology Clinic Note   Patient Name: Eddie Fox Date of Encounter: 09/18/2022  Primary Care Provider:  Owens Loffler, MD Primary Cardiologist:  Eddie Munroe, MD  Patient Profile    Eddie Fox 81 year old male presents to the clinic today for follow-up evaluation of his coronary artery disease and mitral valve insufficiency.  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   Unspecified essential hypertension    Unspecified vitamin D deficiency    Past Surgical History:  Procedure Laterality Date   AMPUTATION     distal phalanx of right thumb- traumatic   CHOLECYSTECTOMY  10/03/09   lap   CORONARY ARTERY BYPASS GRAFT N/A 11/21/2020   Procedure: CORONARY ARTERY BYPASS GRAFTING (CABG) X 4, USING LEFT INTERNAL MAMMARY ARTERY AND RIGHT LEG GREATER SAPHENOUS VEIN HARVESTED ENDOSCOPICALLY;  Surgeon: Eddie Matte, MD;  Location: [facility];  Service: Open Heart Surgery;  Laterality: N/A;   LAMINECTOMY  1995   L 3-T11 Harrington rods in back   LAPAROSCOPIC APPENDECTOMY N/A 05/16/2016   Procedure: APPENDECTOMY LAPAROSCOPIC;  Surgeon: Eddie Pickerel, MD;  Location: [facility];  Service: General;  Laterality: N/A;   LEFT HEART CATH AND CORONARY ANGIOGRAPHY N/A 11/18/2020   Procedure: LEFT HEART CATH AND CORONARY ANGIOGRAPHY;  Surgeon: Eddie Sine, MD;  Location: [facility];  Service: Cardiovascular;  Laterality: N/A;   MELANOMA EXCISION  1995   right upper abd, Dr. Donita Fox HEAD EXCISION Right 06/23/2020   Procedure: RIGHT 5TH METATARSAL OSTEOTOMY WITH BUNIONETTE EXCISION, OPEN REDUCTION OF FIFTH  METATARSAL PHALANGEAL JOINT, FIFTH METATARSAL PHALANGEAL CAPSULOTOMY, BURSECTOMY;  Surgeon: Eddie Crocker, MD;  Location: [facility];  Service: Orthopedics;  Laterality: Right;  LENGTH OF SURGERY: 1.5 HOURS   ORIF ANKLE FRACTURE  1967   TEE WITHOUT CARDIOVERSION N/A 11/21/2020   Procedure: TRANSESOPHAGEAL ECHOCARDIOGRAM (TEE);  Surgeon: Eddie Matte, MD;  Location: [facility];  Service: Open Heart Surgery;  Laterality: N/A;    Allergies  Allergies  Allergen Reactions   Hydrocodone     pts states made him sick.   Pneumococcal Vaccine Swelling    REACTION: significant arm swelling, redness    History of Present Illness    Eddie Fox has a PMH of CAD s/p CABG (CABG x4 LIMA-LAD, SVG-PDA, SVG-OM 2, SVG-D1 in 12/21), PACs, PVCs, mitral valve insufficiency, aortic valve insufficiency, HTN, and HLD.  Echocardiogram 11/15/2021 showed an LVEF of 60 to 65%, G1 DD, mild-moderate AI, mild-moderate MR.  He was noted to have eccentric anterior directed jet which may have been underestimated.  Echo reader recommended cardiac MRI to quantify severity of MR and AI.  He was seen in follow-up by Eddie Rives, PA-C on 11/30/2021.  He reported that he has been doing well since his last visit and denied cardiac symptoms.  He denied shortness of breath, chest discomfort, orthopnea PND, lower extremity swelling, palpitations, lightheadedness and syncope.  He had been staying active.  His blood pressure was elevated 160s over 70s.  He had not been checking his blood pressure  at home regularly.  It appeared that it was normally at goal.    He presents to the clinic today for follow up evaluation and states he feels well.  He continues to be physically active doing walking several times per week and push mowing his grass.  He continues to hunt.  He uses a walking cane when he is hunting to stabilize his balance.  We reviewed his previous echocardiogram and recommendations for follow-up  MRI.  He reports that he does not wish to have further testing at this time.  He was uncomfortable when they were using the ultrasound probe during his previous echocardiogram.  He reports that his PCP monitors his lab work.  I will continue his current medication regimen, have him maintain his physical activity, give a salty 6 diet sheet, and plan follow-up in 9 to 12 months.  Today the denies chest pain, shortness of breath, lower extremity edema, fatigue, palpitations, melena, hematuria, hemoptysis, diaphoresis, weakness, presyncope, syncope, orthopnea, and PND.     Home Medications    Prior to Admission medications   Medication Sig Start Date End Date Taking? Authorizing Provider  acetaminophen (TYLENOL) 500 MG tablet Take 1,000 mg by mouth every 6 (six) hours as needed for mild pain or headache.    [provider]  aspirin 81 MG tablet Take 1 tablet (81 mg total) by mouth daily. 01/17/21   Eddie Munroe, MD  atorvastatin (LIPITOR) 80 MG tablet Take 1 tablet (80 mg total) by mouth every evening. 03/12/22   Eddie Munroe, MD  Calcium-Phosphorus-Vitamin D (VITAMIN D3/CALCIUM/PHOSPHORUS) 120-100-78 UNIT-MG TABS Take 1 tablet by mouth.    [provider]  fluticasone (FLONASE) 50 MCG/ACT nasal spray USE 2 SPRAYS IN BOTH  NOSTRILS ONCE DAILY AS  NEEDED 01/06/21   Fox, Eddie Hamman, MD  ipratropium (ATROVENT) 0.06 % nasal spray INSTILL 2 SPRAYS IN BOTH  NOSTRILS 4 TIMES DAILY AS NEEDED FOR RUNNY NOSE 02/08/22   Fox, Eddie Hamman, MD  lisinopril (ZESTRIL) 40 MG tablet TAKE 1 TABLET BY MOUTH DAILY 08/01/22   Eddie Munroe, MD  Multiple Vitamins-Minerals (MULTIVITAMIN MEN 50+ PO) Take 1 tablet by mouth daily.    [provider]  nitroGLYCERIN (NITROSTAT) 0.4 MG SL tablet Place 1 tablet (0.4 mg total) under the tongue every 5 (five) minutes as needed for chest pain. 09/28/20 12/27/20  Eddie Munroe, MD  pantoprazole ([place]) 40 MG tablet TAKE 1 TABLET BY MOUTH  DAILY 03/21/22   Fox, Eddie Hamman, MD    Family History    Family History  Problem Relation Age of Onset   Heart disease Mother        CHF   Hypertension Mother    Diabetes Mother    Stroke Mother    Hypertension Father    Heart disease Father        CHF   Hypertension Sister    Hypertension Sister    Depression Neg Hx    Alcohol abuse Neg Hx    Drug abuse Neg Hx    He indicated that his mother is deceased. He indicated that his father is deceased. He indicated that his brother is deceased. He indicated that the status of his neg hx is unknown.  Social History    Social History   Socioeconomic History   Marital status: Widowed    Spouse name: Not on file   Number of children: 2   Years of education: Not on file   Highest education level: Not on  file  Occupational History   Occupation: retired    Comment: retired from [employer] 04/2002  Tobacco Use   Smoking status: Never   Smokeless tobacco: Never  Vaping Use   Vaping Use: Never used  Substance and Sexual Activity   Alcohol use: No   Drug use: No   Sexual activity: Yes    Partners: Female  Other Topics Concern   Not on file  Social History Narrative   Not on file   Social Determinants of Health   Financial Resource Strain: Low Risk  (12/04/2021)   Overall Financial Resource Strain (CARDIA)    Difficulty of Paying Living Expenses: Not hard at all  Food Insecurity: No Food Insecurity (12/04/2021)   Hunger Vital Sign    Worried About Running Out of Food in the Last Year: Never true    Ran Out of Food in the Last Year: Never true  Transportation Needs: No Transportation Needs (12/04/2021)   PRAPARE - Hydrologist (Medical): No    Lack of Transportation (Non-Medical): No  Physical Activity: Sufficiently Active (12/04/2021)   Exercise Vital Sign    Days of Exercise per Week: 4 days    Minutes of Exercise per Session: 50 min  Stress: No Stress Concern Present (12/04/2021)    [place]    Feeling of Stress : Not at all  Social Connections: Moderately Isolated (12/04/2021)   Social Connection and Isolation Panel [NHANES]    Frequency of Communication with Friends and Family: More than three times a week    Frequency of Social Gatherings with Friends and Family: More than three times a week    Attends Religious Services: More than 4 times per year    Active Member of [employer] or Organizations: No    Attends Archivist Meetings: Never    Marital Status: Widowed  Intimate Partner Violence: Not At Risk (12/04/2021)   Humiliation, Afraid, Rape, and Kick questionnaire    Fear of Current or Ex-Partner: No    Emotionally Abused: No    Physically Abused: No    Sexually Abused: No     Review of Systems    General:  No chills, fever, night sweats or weight changes.  Cardiovascular:  No chest pain, dyspnea on exertion, edema, orthopnea, palpitations, paroxysmal nocturnal dyspnea. Dermatological: No rash, lesions/masses Respiratory: No cough, dyspnea Urologic: No hematuria, dysuria Abdominal:   No nausea, vomiting, diarrhea, bright red blood per rectum, melena, or hematemesis Neurologic:  No visual changes, wkns, changes in mental status. All other systems reviewed and are otherwise negative except as noted above.  Physical Exam    VS:  BP 132/68   Pulse (!) 48   Wt 150 lb (68 kg)   SpO2 97%   BMI 23.49 kg/m  , BMI Body mass index is 23.49 kg/m. GEN: Well nourished, well developed, in no acute distress. HEENT: normal. Neck: Supple, no JVD, carotid bruits, or masses. Cardiac: RRR, no murmurs, rubs, or gallops. No clubbing, cyanosis, edema.  Radials/DP/PT 2+ and equal bilaterally.  Respiratory:  Respirations regular and unlabored, clear to auscultation bilaterally. GI: Soft, nontender, nondistended, BS + x 4. MS: no deformity or atrophy. Skin: warm and dry, no rash. Neuro:  Strength  and sensation are intact. Psych: Normal affect.  Accessory Clinical Findings    Recent Labs: 12/20/2021: ALT 15; BUN 18; Creatinine, Ser 1.06; Hemoglobin 14.1; Platelets 162.0; Potassium 4.9; Sodium 142   Recent  Lipid Panel    Component Value Date/Time   CHOL 105 12/20/2021 1148   TRIG 57.0 12/20/2021 1148   HDL 39.90 12/20/2021 1148   CHOLHDL 3 12/20/2021 1148   VLDL 11.4 12/20/2021 1148   LDLCALC 53 12/20/2021 1148   LDLDIRECT 171.9 07/08/2007 1044         ECG personally reviewed by me today-sinus bradycardia left axis deviation nonspecific ST and T wave abnormality 48 bpm- No acute changes  Nuclear Stress Test 10/19/2020 Nuclear stress EF: 56%. There are no significant wall motion abnormalities. There was no ST segment deviation noted during stress. Defect 1: There is a medium defect of moderate severity present in the basal inferolateral and mid inferolateral location. Prior CT of abdomen in 2018 demonstrates diffuse aortic atherosclerosis as well as RCA and circumflex coronary calcification in the segments visualized. This is a low to intermediate risk study. There is what appears to be a nonreversible inferolateral wall perfusion defect seen at both rest and stress. No significant ischemia identified. _______________   Left Cardiac Catheterization 11/18/2020: Dist RCA lesion is 90% stenosed. RPAV lesion is 60% stenosed. Mid RCA lesion is 30% stenosed. Ost Cx to Prox Cx lesion is 95% stenosed. Prox LAD lesion is 50% stenosed. 1st Diag lesion is 50% stenosed. Mid LAD-1 lesion is 70% stenosed. Mid LAD-2 lesion is 80% stenosed. Dist LAD-1 lesion is 85% stenosed. Dist LAD-2 lesion is 85% stenosed. The left ventricular systolic function is normal. LV end diastolic pressure is low.   Severe multivessel CAD with evidence for coronary calcification involving the left coronary system.    There is significant segmental LAD stenoses of 50% proximally also involving the first  diagonal vessel followed by 70% stenosis in the proximal to mid segment, and segmental 80 to 85% mid stenosis.   Left circumflex stenosis 95% ostial stenosis and reduced antegrade flow secondary to competitive filling via RCA collateralization.   Dominant RCA with significant vessel tortuosity with 30% mid stenosis, focal 90% stenosis immediately proximal to the takeoff of the PDA vessel followed by 60% stenosis of the continuation branch PLA with collateralization to the distal circumflex from the distal RCA branches.   Preserved LV function without focal segmental wall motion abnormalities.  LVEDP 6 mm.   Recommendation: Recommend surgical consultation for CABG revascularization.  With the patient's high-grade distal RCA stenosis collateralizing the circumflex vessel with 95% ostial stenosis and with diffuse LAD disease, recommend the patient stay in the hospital.  Plan for increased medical therapy with addition of nitrates, very low dose beta-blocker as pulse allows and initiation of heparin later tonight.    Diagnostic Dominance: Right  _______________   Echocardiogram 11/15/2021: Impressions: 1. Left ventricular ejection fraction, by estimation, is 60 to 65%. The  left ventricle has normal function. The left ventricle has no regional  wall motion abnormalities. There is mild left ventricular hypertrophy.  Left ventricular diastolic parameters  are consistent with Grade I diastolic dysfunction (impaired relaxation).   2. Right ventricular systolic function is mildly reduced. The right  ventricular size is normal. Tricuspid regurgitation signal is inadequate  for assessing PA pressure.   3. The aortic valve is tricuspid. Aortic valve regurgitation is mild to  moderate. Aortic valve sclerosis is present, with no evidence of aortic  valve stenosis.   4. The mitral valve is abnormal. Mild to moderate mitral valve  regurgitation. Eccentric anterior directed MR jet. While may be   underestimating MR severity given eccentric jet, low E wave velocity with  E<A and normal LA/LV size argues against significant   MR. Could consider cardiac MRI to quantify severity of MR and AI     Assessment & Plan   1.  CAD-no chest pain today.  Denies recent episodes of arm neck back or chest discomfort.  Status post CABG x4 12/21.  Echocardiogram 11/22 showed normal LVEF and G1 DD. Continue aspirin, atorvastatin, lisinopril, sublingual nitroglycerin as needed Heart healthy low-sodium diet-salty 6 given Increase physical activity as tolerated  Hyperlipidemia- 12/20/2021: Cholesterol 105; HDL 39.90; LDL Cholesterol 53; Triglycerides 57.0; VLDL 11.4 Continue aspirin, atorvastatin Heart healthy low-sodium high fiber diet Increase physical activity as tolerated Repeat lipids and lfts 1/24  PAC,PVC-denies palpitations.  Previously on metoprolol stopped due to bradycardia and possible hypotension. Continue to monitor   Mitral valve insufficiency-no increased activity intolerance or dyspnea.  Remains physically active. Echocardiogram 09/26/2021 showed mild-moderate mitral valve regurgitation.  Cardiac MRI previously recommended to further evaluate.   Aortic valve insufficiency-mild-moderate aortic valve regurgitation noted on previous echocardiogram.  Continues to do well from a cardiac standpoint.  No increased dyspnea. Wishes to defer echo do to pain with last study  Essential hypertenson-BP today 132/68 Continue lisinopril Heart healthy low-sodium diet-salty 6 given Increase physical activity as tolerated  Disposition: Follow up with Dr. Margaretann Loveless , [facility] Fox or me in 9-12 months    Jossie Ng. Danae Oland NP-C     09/18/2022, 1:49 PM [facility] [place] [address] Office [phone] Fax [phone]  Notice: This dictation was prepared with Dragon dictation along with smaller phrase technology. Any transcriptional errors that result from  this process are unintentional and may not be corrected upon review.  I spent 14 minutes examining this patient, reviewing medications, and using patient centered shared decision making involving her cardiac care.  Prior to her visit I spent greater than 20 minutes reviewing her past medical history,  medications, and prior cardiac tests.

## 2022-09-18 ENCOUNTER — Ambulatory Visit: Payer: Medicare Other | Attending: Internal Medicine | Admitting: General Practice

## 2022-09-18 ENCOUNTER — Encounter: Payer: Self-pay | Admitting: General Practice

## 2022-09-18 VITALS — BP 132/68 | HR 48 | Wt 150.0 lb

## 2022-09-18 DIAGNOSIS — E785 Hyperlipidemia, unspecified: Secondary | ICD-10-CM | POA: Diagnosis not present

## 2022-09-18 DIAGNOSIS — I493 Ventricular premature depolarization: Secondary | ICD-10-CM | POA: Diagnosis not present

## 2022-09-18 DIAGNOSIS — I351 Nonrheumatic aortic (valve) insufficiency: Secondary | ICD-10-CM

## 2022-09-18 DIAGNOSIS — Z951 Presence of aortocoronary bypass graft: Secondary | ICD-10-CM | POA: Diagnosis not present

## 2022-09-18 DIAGNOSIS — I2510 Atherosclerotic heart disease of native coronary artery without angina pectoris: Secondary | ICD-10-CM

## 2022-09-18 DIAGNOSIS — I491 Atrial premature depolarization: Secondary | ICD-10-CM

## 2022-09-18 DIAGNOSIS — I10 Essential (primary) hypertension: Secondary | ICD-10-CM | POA: Diagnosis not present

## 2022-09-18 DIAGNOSIS — I340 Nonrheumatic mitral (valve) insufficiency: Secondary | ICD-10-CM | POA: Diagnosis not present

## 2022-09-18 NOTE — Patient Instructions (Signed)
Medication Instructions:  The current medical regimen is effective;  continue present plan and medications as directed. Please refer to the Current Medication list given to you today.  *If you need a refill on your cardiac medications before your next appointment, please call your pharmacy*   Lab Work: NONE  Other Instructions PLEASE READ AND FOLLOW ATTACHED  SALTY 6  MAINTAIN CURRENT PHYSICAL ACTIVITY-AS TOLERATED  Follow-Up: At [facility], you and your health needs are our priority.  As part of our continuing mission to provide you with exceptional heart care, we have created designated Provider Care Teams.  These Care Teams include your primary Cardiologist (physician) and Advanced Practice Providers (APPs -  Physician Assistants and Nurse Practitioners) who all work together to provide you with the care you need, when you need it.  We recommend signing up for the patient portal called "MyChart".  Sign up information is provided on this After Visit Summary.  MyChart is used to connect with patients for Virtual Visits (Telemedicine).  Patients are able to view lab/test results, encounter notes, upcoming appointments, etc.  Non-urgent messages can be sent to your provider as well.   To learn more about what you can do with MyChart, go to [email or web address].    Your next appointment:   9-12 month(s)  The format for your next appointment:   In Person  Provider:   Elouise Munroe, MD    Important Information About [place]  SALTY 6

## 2022-09-28 DIAGNOSIS — H52203 Unspecified astigmatism, bilateral: Secondary | ICD-10-CM | POA: Diagnosis not present

## 2022-09-28 DIAGNOSIS — Z961 Presence of intraocular lens: Secondary | ICD-10-CM | POA: Diagnosis not present

## 2022-10-21 IMAGING — DX DG CHEST 1V PORT
1 series · 1 of 1 positions shown · non-contrast
Comparison: 11/22/2020

CLINICAL DATA: Recent bypass surgery.

EXAM:
PORTABLE CHEST 1 VIEW

[chest]
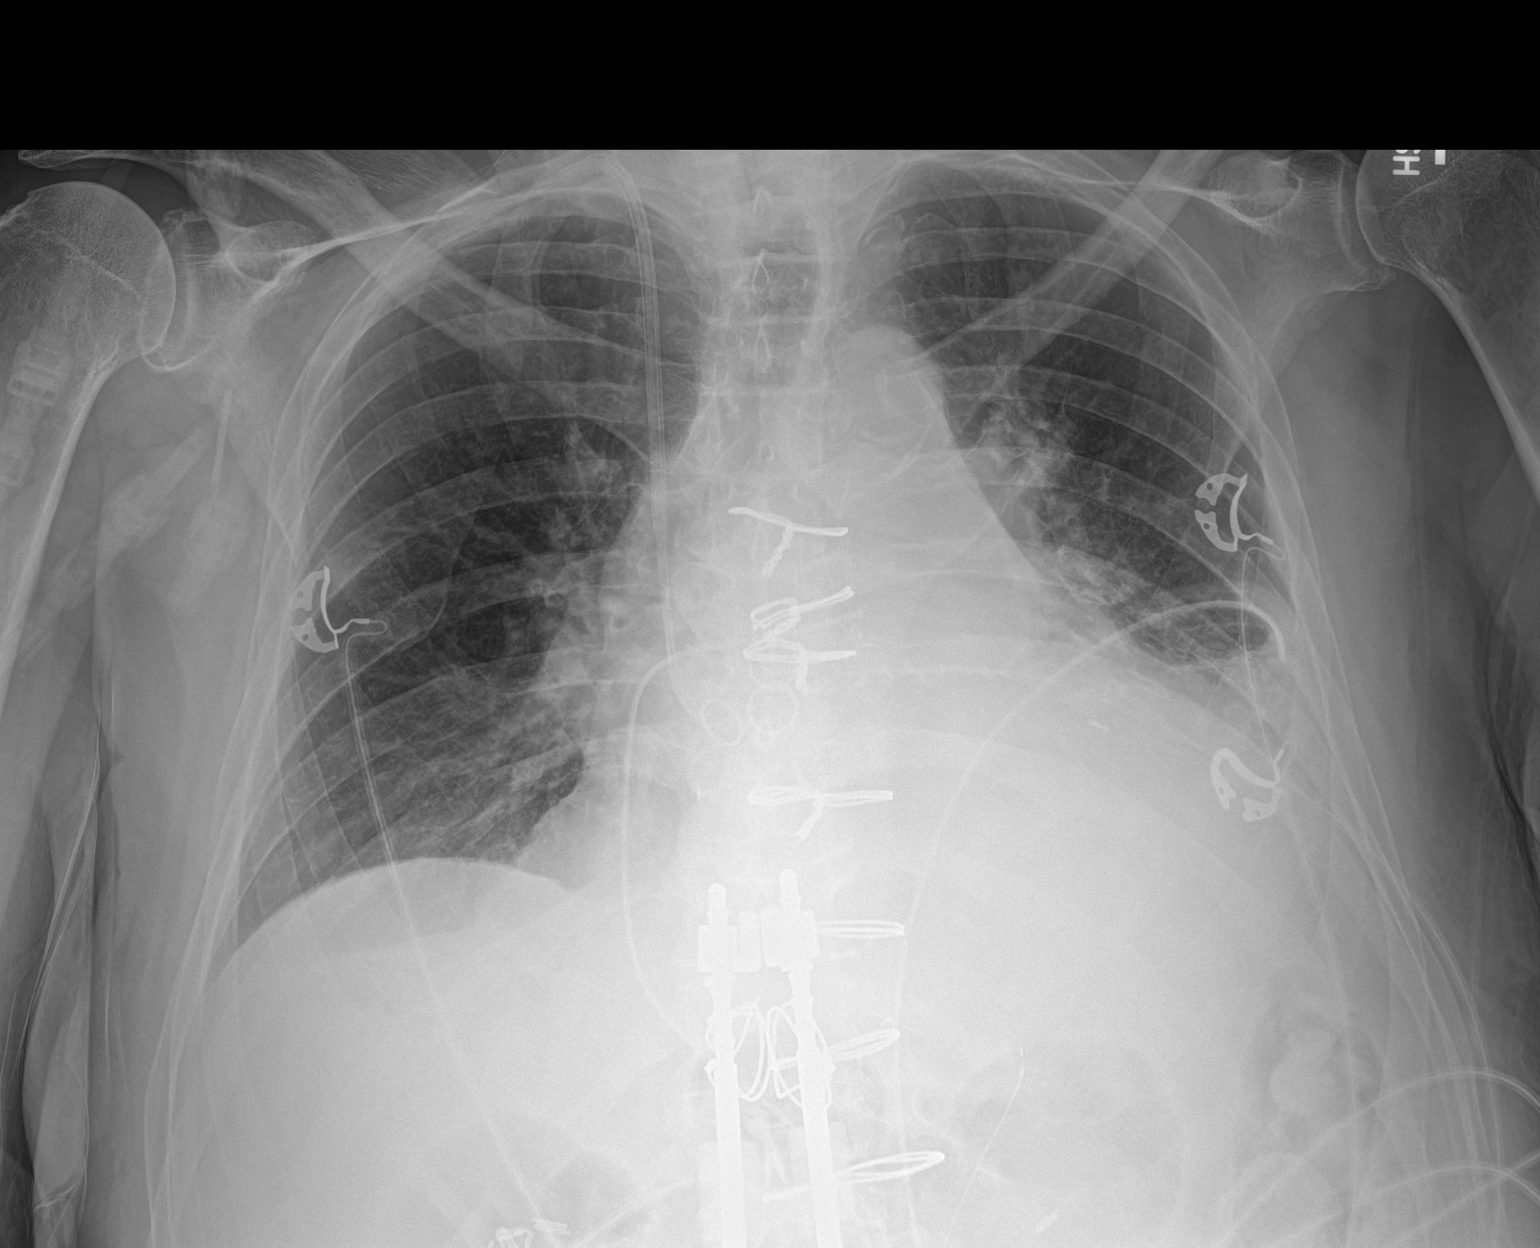

[1 of 1 positions shown; findings below may reference images not displayed]

FINDINGS: The right IJ catheter is stable.

The left-sided chest tube is stable. No left-sided pneumothorax.

Stable cardiac enlargement and prominent mediastinal and hilar
contours.

Persistent and slightly progressive left lower lobe process, likely
a combination of effusion and atelectasis. No pulmonary edema.
IMPRESSION: 1. Stable left-sided chest tube without pneumothorax.
2. Persistent and slightly progressive left lower lobe process,
likely a combination of effusion and atelectasis.

## 2022-10-22 ENCOUNTER — Other Ambulatory Visit: Payer: Self-pay | Admitting: Internal Medicine

## 2022-10-25 ENCOUNTER — Other Ambulatory Visit: Payer: Self-pay | Admitting: Family Medicine

## 2022-11-15 ENCOUNTER — Telehealth: Payer: Self-pay | Admitting: Internal Medicine

## 2022-11-15 NOTE — Telephone Encounter (Signed)
*  STAT* If patient is at the pharmacy, call can be transferred to refill team.   1. Which medications need to be refilled? (please list name of each medication and dose if known) atorvastatin (LIPITOR) 80 MG tablet   2. Which pharmacy/location (including street and city if local pharmacy) is medication to be sent to?  [facility] ([place], [place]    3. Do they need a 30 day or 90 day supply? 90 day

## 2022-11-16 MED ORDER — ATORVASTATIN CALCIUM 80 MG PO TABS: 80.0000 mg | ORAL_TABLET | Freq: Every day | ORAL | 3 refills | Status: DC

## 2022-12-02 ENCOUNTER — Other Ambulatory Visit: Payer: Self-pay | Admitting: Family Medicine

## 2022-12-02 DIAGNOSIS — Z79899 Other long term (current) drug therapy: Secondary | ICD-10-CM

## 2022-12-02 DIAGNOSIS — C181 Malignant neoplasm of appendix: Secondary | ICD-10-CM

## 2022-12-02 DIAGNOSIS — E7800 Pure hypercholesterolemia, unspecified: Secondary | ICD-10-CM

## 2022-12-04 ENCOUNTER — Other Ambulatory Visit (INDEPENDENT_AMBULATORY_CARE_PROVIDER_SITE_OTHER): Payer: Medicare Other

## 2022-12-04 DIAGNOSIS — C181 Malignant neoplasm of appendix: Secondary | ICD-10-CM | POA: Diagnosis not present

## 2022-12-04 DIAGNOSIS — Z79899 Other long term (current) drug therapy: Secondary | ICD-10-CM | POA: Diagnosis not present

## 2022-12-04 DIAGNOSIS — E7800 Pure hypercholesterolemia, unspecified: Secondary | ICD-10-CM

## 2022-12-04 LAB — CBC WITH DIFFERENTIAL/PLATELET
Basophils Absolute: 0 10*3/uL (ref 0.0–0.1)
Basophils Relative: 0.6 % (ref 0.0–3.0)
Eosinophils Absolute: 0.2 10*3/uL (ref 0.0–0.7)
Eosinophils Relative: 2.9 % (ref 0.0–5.0)
HCT: 44.5 % (ref 39.0–52.0)
Hemoglobin: 15.3 g/dL (ref 13.0–17.0)
Lymphocytes Relative: 43.3 % (ref 12.0–46.0)
Lymphs Abs: 3.2 10*3/uL (ref 0.7–4.0)
MCHC: 34.4 g/dL (ref 30.0–36.0)
MCV: 98.1 fl (ref 78.0–100.0)
Monocytes Absolute: 0.4 10*3/uL (ref 0.1–1.0)
Monocytes Relative: 5.1 % (ref 3.0–12.0)
Neutro Abs: 3.6 10*3/uL (ref 1.4–7.7)
Neutrophils Relative %: 48.1 % (ref 43.0–77.0)
Platelets: 226 10*3/uL (ref 150.0–400.0)
RBC: 4.54 Mil/uL (ref 4.22–5.81)
RDW: 12.7 % (ref 11.5–15.5)
WBC: 7.4 10*3/uL (ref 4.0–10.5)

## 2022-12-04 LAB — LIPID PANEL
Cholesterol: 96 mg/dL (ref 0–200)
HDL: 33.6 mg/dL — ABNORMAL LOW (ref >39.00)
LDL Cholesterol: 43 mg/dL (ref 0–99)
NonHDL: 62.62
Total CHOL/HDL Ratio: 3
Triglycerides: 100 mg/dL (ref 0.0–149.0)
VLDL: 20 mg/dL (ref 0.0–40.0)

## 2022-12-04 LAB — HEPATIC FUNCTION PANEL
ALT: 12 U/L (ref 0–53)
AST: 17 U/L (ref 0–37)
Albumin: 4.1 g/dL (ref 3.5–5.2)
Alkaline Phosphatase: 62 U/L (ref 39–117)
Bilirubin, Direct: 0.2 mg/dL (ref 0.0–0.3)
Total Bilirubin: 0.6 mg/dL (ref 0.2–1.2)
Total Protein: 6.4 g/dL (ref 6.0–8.3)

## 2022-12-04 LAB — BASIC METABOLIC PANEL
BUN: 25 mg/dL — ABNORMAL HIGH (ref 6–23)
CO2: 28 mEq/L (ref 19–32)
Calcium: 9.4 mg/dL (ref 8.4–10.5)
Chloride: 106 mEq/L (ref 96–112)
Creatinine, Ser: 1.26 mg/dL (ref 0.40–1.50)
GFR: 53.83 mL/min — ABNORMAL LOW (ref >60.00)
Glucose, Bld: 86 mg/dL (ref 70–99)
Potassium: 4.2 mEq/L (ref 3.5–5.1)
Sodium: 142 mEq/L (ref 135–145)

## 2022-12-05 ENCOUNTER — Ambulatory Visit (INDEPENDENT_AMBULATORY_CARE_PROVIDER_SITE_OTHER): Payer: Medicare Other

## 2022-12-05 VITALS — Ht 67.0 in | Wt 150.0 lb

## 2022-12-05 DIAGNOSIS — Z0000 Encounter for general adult medical examination without abnormal findings: Secondary | ICD-10-CM

## 2022-12-05 LAB — CEA: CEA: <2 ng/mL

## 2022-12-05 NOTE — Progress Notes (Signed)
Subjective:   Eddie Fox is a 81 y.o. male who presents for Medicare Annual/Subsequent preventive examination.  Review of Systems    No ROS.  Medicare Wellness Virtual Visit.  Visual/audio telehealth visit, UTA vital signs.   See social history for additional risk factors.   Cardiac Risk Factors include: advanced age (>67mn, >>54women);male gender;hypertension     Objective:    Today's Vitals   12/05/22 1237  Weight: 150 lb (68 kg)  Height: 5' 7"  (1.702 m)   Body mass index is 23.49 kg/m.     12/05/2022   12:38 PM 12/04/2021   12:07 PM 11/21/2020   11:00 PM 11/18/2020    5:52 AM 06/23/2020   10:19 AM 06/15/2020   11:17 AM 05/24/2020   12:48 PM  Advanced Directives  Does Patient Have a Medical Advance Directive? No Yes No No Yes Yes No  Type of Advance Directive  Living will   Living will    Does patient want to make changes to medical advance directive?  Yes (MAU/Ambulatory/Procedural Areas - Information given)   No - Patient declined    Copy of [place]?     No - copy requested No - copy requested   Would patient like information on creating a medical advance directive? No - Patient declined  No - Patient declined No - Patient declined   No - Patient declined    Current Medications (verified) Outpatient Encounter Medications as of 12/05/2022  Medication Sig   acetaminophen (TYLENOL) 500 MG tablet Take 1,000 mg by mouth every 6 (six) hours as needed for mild pain or headache.   aspirin 81 MG tablet Take 1 tablet (81 mg total) by mouth daily.   atorvastatin (LIPITOR) 80 MG tablet Take 1 tablet (80 mg total) by mouth daily.   Calcium-Phosphorus-Vitamin D (VITAMIN D3/CALCIUM/PHOSPHORUS) 120-100-78 UNIT-MG TABS Take 1 tablet by mouth.   fluticasone (FLONASE) 50 MCG/ACT nasal spray USE 2 SPRAYS IN BOTH  NOSTRILS ONCE DAILY AS  NEEDED   ipratropium (ATROVENT) 0.06 % nasal spray INSTILL 2 SPRAYS IN BOTH  NOSTRILS 4 TIMES DAILY AS NEEDED FOR  RUNNY NOSE   lisinopril (ZESTRIL) 40 MG tablet TAKE 1 TABLET BY MOUTH DAILY   Multiple Vitamins-Minerals (MULTIVITAMIN MEN 50+ PO) Take 1 tablet by mouth daily.   nitroGLYCERIN (NITROSTAT) 0.4 MG SL tablet Place 1 tablet (0.4 mg total) under the tongue every 5 (five) minutes as needed for chest pain.   pantoprazole (PROTONIX) 40 MG tablet TAKE 1 TABLET BY MOUTH DAILY   No facility-administered encounter medications on file as of 12/05/2022.    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   Unspecified essential hypertension    Unspecified vitamin D deficiency    Past Surgical History:  Procedure Laterality Date   AMPUTATION     distal phalanx of right thumb- traumatic   CHOLECYSTECTOMY  10/03/09   lap   CORONARY ARTERY BYPASS GRAFT N/A 11/21/2020   Procedure: CORONARY ARTERY BYPASS GRAFTING (CABG) X 4, USING LEFT INTERNAL MAMMARY ARTERY  AND RIGHT LEG GREATER SAPHENOUS VEIN HARVESTED ENDOSCOPICALLY;  Surgeon: Lajuana Matte, MD;  Location: [place];  Service: Open Heart Surgery;  Laterality: N/A;   LAMINECTOMY  1995   L 3-T11 Harrington rods in back   LAPAROSCOPIC APPENDECTOMY N/A 05/16/2016   Procedure: APPENDECTOMY LAPAROSCOPIC;  Surgeon: Greer Pickerel, MD;  Location: [place];  Service: General;  Laterality: N/A;   LEFT HEART CATH AND CORONARY ANGIOGRAPHY N/A 11/18/2020   Procedure: LEFT HEART CATH AND CORONARY ANGIOGRAPHY;  Surgeon: Troy Sine, MD;  Location: [facility];  Service: Cardiovascular;  Laterality: N/A;   MELANOMA EXCISION  1995   right upper abd, Dr. Donita Brooks HEAD EXCISION Right 06/23/2020   Procedure: RIGHT 5TH METATARSAL OSTEOTOMY WITH BUNIONETTE EXCISION, OPEN REDUCTION OF FIFTH METATARSAL PHALANGEAL JOINT, FIFTH METATARSAL PHALANGEAL CAPSULOTOMY, BURSECTOMY;  Surgeon: Erle Crocker, MD;  Location: [place];  Service: Orthopedics;  Laterality: Right;  LENGTH OF SURGERY: 1.5 HOURS   ORIF ANKLE FRACTURE  1967   TEE WITHOUT CARDIOVERSION N/A 11/21/2020   Procedure: TRANSESOPHAGEAL ECHOCARDIOGRAM (TEE);  Surgeon: Lajuana Matte, MD;  Location: [place];  Service: Open Heart Surgery;  Laterality: N/A;   Family History  Problem Relation Age of Onset   Heart disease Mother        CHF   Hypertension Mother    Diabetes Mother    Stroke Mother    Hypertension Father    Heart disease Father        CHF   Hypertension Sister    Hypertension Sister    Depression Neg Hx    Alcohol abuse Neg Hx    Drug abuse Neg Hx    Social History   Socioeconomic History   Marital status: Widowed    Spouse name: Not on file   Number of children: 2   Years of education: Not on file   Highest education level: Not on file  Occupational History   Occupation: retired    Comment: retired from [employer] 04/2002  Tobacco Use   Smoking status: Never   Smokeless tobacco: Never  Vaping Use   Vaping Use: Never used  Substance and Sexual Activity   Alcohol use: No   Drug use: No   Sexual activity: Yes    Partners: Female  Other Topics Concern   Not on file  Social History Narrative   Not on file   Social Determinants of Health   Financial Resource Strain: Low Risk  (12/05/2022)   Overall Financial Resource Strain (CARDIA)    Difficulty of Paying Living Expenses: Not hard at all  Food Insecurity: No Food Insecurity (12/05/2022)   Hunger Vital Sign    Worried About Running Out of Food in the Last Year: Never true    [place] in the Last Year: Never true  Transportation Needs: No Transportation Needs (12/05/2022)    PRAPARE - Hydrologist (Medical): No    Lack of Transportation (Non-Medical): No  Physical Activity: Sufficiently Active (12/05/2022)   Exercise Vital Sign    Days of Exercise per Week: 4 days    Minutes of Exercise per Session: 50 min  Stress: No Stress Concern Present (12/05/2022)   [place]    Feeling of Stress : Not at all  Social Connections: Moderately Isolated (12/05/2022)   Social Connection and Isolation Panel [NHANES]    Frequency of Communication with  Friends and Family: More than three times a week    Frequency of Social Gatherings with Friends and Family: More than three times a week    Attends Religious Services: More than 4 times per year    Active Member of [employer] or Organizations: No    Attends Archivist Meetings: Never    Marital Status: Widowed    Tobacco Counseling Counseling given: Not Answered   Clinical Intake:  Pre-visit preparation completed: Yes        Diabetes: No  How often do you need to have someone help you when you read instructions, pamphlets, or other written materials from your doctor or pharmacy?: 1 - Never    Interpreter Needed?: No      Activities of Daily Living    12/05/2022   12:42 PM  In your present state of health, do you have any difficulty performing the following activities:  Hearing? 0  Vision? 0  Difficulty concentrating or making decisions? 0  Walking or climbing stairs? 0  Dressing or bathing? 0  Doing errands, shopping? 0  Preparing Food and eating ? N  Using the Toilet? N  In the past six months, have you accidently leaked urine? N  Do you have problems with loss of bowel control? N  Managing your Medications? N  Managing your Finances? N  Housekeeping or managing your Housekeeping? N    Patient Care Team: Owens Loffler, MD as PCP - General Elouise Munroe, MD as PCP - Cardiology  (Cardiology) Danella Sensing, MD as Consulting Physician (Dermatology) Katy Apo, MD as Consulting Physician (Ophthalmology)  Indicate any recent Medical Services you may have received from other than [facility] providers in the past year (date may be approximate).     Assessment:   This is a routine wellness examination for Eddie Fox.  I connected with  [name] on 12/05/22 by a audio enabled telemedicine application and verified that I am speaking with the correct person using two identifiers.  Patient Location: Home  Provider Location: Office/Clinic  I discussed the limitations of evaluation and management by telemedicine. The patient expressed understanding and agreed to proceed.   Hearing/Vision screen Hearing Screening - Comments:: Difficulty hearing conversational tones. Does not wear hearing aid.  Vision Screening - Comments:: Wears corrective lenses when reading Cataract extraction, bilateral They have seen their ophthalmologist in the last 12 months.    Dietary issues and exercise activities discussed: Current Exercise Habits: Home exercise routine, Type of exercise: strength training/weights (leg/arm exercises), Time (Minutes): 20, Frequency (Times/Week): 4, Weekly Exercise (Minutes/Week): 80, Intensity: Mild   Goals Addressed             This Visit's Progress    Patient Stated   On track    Would like to maintain current routine       Depression Screen    12/05/2022   12:42 PM 12/04/2021   12:13 PM 04/20/2021   12:06 PM 11/23/2019    2:44 PM 11/20/2018   12:56 PM 11/20/2017   11:41 AM 11/07/2016    2:32 PM  PHQ 2/9 Scores  PHQ - 2 Score 0 0 0 0 0 0 0  PHQ- 9 Score   0 0 0      Fall Risk    12/05/2022   12:40 PM 12/04/2021   12:10 PM 04/20/2021   12:07 PM 11/23/2019    2:43 PM 11/20/2018   12:56 PM  [place] in the past year? 0  1 0 1 1  Comment    tripped on vine 2 falls while walking in woods  Number falls in past yr: 0 0  1 1  Injury  with Fall?  0  0 0  Risk for fall due to :  Other (Comment)  Medication side effect   Risk for fall due to: Comment  tripped over curb     Follow up Falls evaluation completed;Falls prevention discussed Falls prevention discussed  Falls evaluation completed;Falls prevention discussed     FALL RISK PREVENTION PERTAINING TO THE HOME: Home free of loose throw rugs in walkways, pet beds, electrical cords, etc? Yes  Adequate lighting in your home to reduce risk of falls? Yes   ASSISTIVE DEVICES UTILIZED TO PREVENT FALLS: Life alert? No  Use of a cane, walker or w/c? No  Grab bars in the bathroom? Yes  Shower chair or bench in shower? Yes  Comfort chair height toilet? Yes   TIMED UP AND GO: Was the test performed? No .   Cognitive Function:    11/23/2019    2:47 PM 11/20/2018   12:59 PM 11/07/2016    2:18 PM  MMSE - Mini Mental State Exam  Orientation to time 5 5 5   Orientation to Place 5 5 5   Registration 3 3 3   Attention/ Calculation 5 0 0  Recall 3 3 3   Language- name 2 objects  0 0  Language- repeat 1 1 1   Language- follow 3 step command  3 3  Language- read & follow direction  0 0  Write a sentence  0 0  Copy design  0 0  Total score  20 20        Immunizations Immunization History  Administered Date(s) Administered   Influenza Split 12/25/2011   Influenza, High Dose Seasonal PF 08/31/2020, 09/06/2021, 03/13/2022   Influenza,inj,Quad PF,6+ Mos 08/19/2013, 08/26/2014, 09/14/2015, 09/11/2016, 08/28/2017, 08/27/2018, 08/10/2019   PFIZER(Purple Top)SARS-COV-2 Vaccination 01/06/2020, 01/27/2020   Pfizer Covid-19 Vaccine Bivalent Booster 45yr & up 09/07/2021   Pneumococcal Conjugate-13 02/16/2015   Pneumococcal Polysaccharide-23 06/28/2022   Td 03/24/2002, 12/22/2009   Tdap 01/01/2022   Zoster Recombinat (Shingrix) 01/03/2022, 03/13/2022   Flu Vaccine status: Due, Education has been provided regarding the importance of this vaccine. Advised may receive this vaccine at  local pharmacy or Health Dept. Aware to provide a copy of the vaccination record if obtained from local pharmacy or Health Dept. Verbalized acceptance and understanding.  Covid-19 vaccine status: Completed vaccines x3.  Screening Tests Health Maintenance  Topic Date Due   INFLUENZA VACCINE  07/17/2022   COVID-19 Vaccine (4 - 2023-24 season) 12/21/2022 (Originally 08/17/2022)   Medicare Annual Wellness (AWV)  12/06/2023   DTaP/Tdap/Td (4 - Td or Tdap) 01/02/2032   Pneumonia Vaccine 80 Years old  Completed   Zoster Vaccines- Shingrix  Completed   HPV VACCINES  Aged Out   Health Maintenance Health Maintenance Due  Topic Date Due   INFLUENZA VACCINE  07/17/2022   Lung Cancer Screening: (Low Dose CT Chest recommended if Age 80-80years, 30 pack-year currently smoking OR have quit w/in 15years.) does not qualify.   Hepatitis C Screening: does not qualify.  Vision Screening: Recommended annual ophthalmology exams for early detection of glaucoma and other disorders of the eye.  Dental Screening: Recommended annual dental exams for proper oral hygiene  Community Resource Referral / Chronic Care Management: CRR required this visit?  No   CCM required this visit?  No  Plan:     I have personally reviewed and noted the following in the patient's chart:   Medical and social history Use of alcohol, tobacco or illicit drugs  Current medications and supplements including opioid prescriptions. Patient is not currently taking opioid prescriptions. Functional ability and status Nutritional status Physical activity Advanced directives List of other physicians Hospitalizations, surgeries, and ER visits in previous 12 months Vitals Screenings to include cognitive, depression, and falls Referrals and appointments  In addition, I have reviewed and discussed with patient certain preventive protocols, quality metrics, and best practice recommendations. A written personalized care plan  for preventive services as well as general preventive health recommendations were provided to patient.     Leta Jungling, LPN   [id number]

## 2022-12-05 NOTE — Patient Instructions (Addendum)
[name] , Thank you for taking time to come for your Medicare Wellness Visit. I appreciate your ongoing commitment to your health goals. Please review the following plan we discussed and let me know if I can assist you in the future.   These are the goals we discussed:  Goals Addressed             This Visit's Progress    Patient Stated   On track    Would like to maintain current routine         This is a list of the screening recommended for you and due dates:  Health Maintenance  Topic Date Due   Flu Shot  07/17/2022   COVID-19 Vaccine (4 - 2023-24 season) 12/21/2022*   Medicare Annual Wellness Visit  12/06/2023   DTaP/Tdap/Td vaccine (4 - Td or Tdap) 01/02/2032   Pneumonia Vaccine  Completed   Zoster (Shingles) Vaccine  Completed   HPV Vaccine  Aged Out  *Topic was postponed. The date shown is not the original due date.    Advanced directives: End of life planning; Advance aging; Advanced directives discussed.  Copy of current HCPOA/Living Will requested.    Conditions/risks identified: none new  Next appointment: Follow up in one year for your annual wellness visit.   Preventive Care 80 Years and Older, Male  Preventive care refers to lifestyle choices and visits with your health care provider that can promote health and wellness. What does preventive care include? A yearly physical exam. This is also called an annual well check. Dental exams once or twice a year. Routine eye exams. Ask your health care provider how often you should have your eyes checked. Personal lifestyle choices, including: Daily care of your teeth and gums. Regular physical activity. Eating a healthy diet. Avoiding tobacco and drug use. Limiting alcohol use. Practicing safe sex. Taking low doses of aspirin every day. Taking vitamin and mineral supplements as recommended by your health care provider. What happens during an annual well check? The services and screenings done by your  health care provider during your annual well check will depend on your age, overall health, lifestyle risk factors, and family history of disease. Counseling  Your health care provider may ask you questions about your: Alcohol use. Tobacco use. Drug use. Emotional well-being. Home and relationship well-being. Sexual activity. Eating habits. History of falls. Memory and ability to understand (cognition). Work and work Statistician. Screening  You may have the following tests or measurements: Height, weight, and BMI. Blood pressure. Lipid and cholesterol levels. These may be checked every 5 years, or more frequently if you are over 11 years old. Skin check. Lung cancer screening. You may have this screening every year starting at age 95 if you have a 30-pack-year history of smoking and currently smoke or have quit within the past 15 years. Fecal occult blood test (FOBT) of the stool. You may have this test every year starting at age 48. Flexible sigmoidoscopy or colonoscopy. You may have a sigmoidoscopy every 5 years or a colonoscopy every 10 years starting at age 90. Prostate cancer screening. Recommendations will vary depending on your family history and other risks. Hepatitis C blood test. Hepatitis B blood test. Sexually transmitted disease (STD) testing. Diabetes screening. This is done by checking your blood sugar (glucose) after you have not eaten for a while (fasting). You may have this done every 1-3 years. Abdominal aortic aneurysm (AAA) screening. You may need this if you are a current  or former smoker. Osteoporosis. You may be screened starting at age 62 if you are at high risk. Talk with your health care provider about your test results, treatment options, and if necessary, the need for more tests. Vaccines  Your health care provider may recommend certain vaccines, such as: Influenza vaccine. This is recommended every year. Tetanus, diphtheria, and acellular pertussis  (Tdap, Td) vaccine. You may need a Td booster every 10 years. Zoster vaccine. You may need this after age 92. Pneumococcal 13-valent conjugate (PCV13) vaccine. One dose is recommended after age 15. Pneumococcal polysaccharide (PPSV23) vaccine. One dose is recommended after age 58. Talk to your health care provider about which screenings and vaccines you need and how often you need them. This information is not intended to replace advice given to you by your health care provider. Make sure you discuss any questions you have with your health care provider. Document Released: 12/30/2015 Document Revised: 08/22/2016 Document Reviewed: 10/04/2015 Elsevier Interactive Patient Education  2017 [place] Prevention in the Home Falls can cause injuries. They can happen to people of all ages. There are many things you can do to make your home safe and to help prevent falls. What can I do on the outside of my home? Regularly fix the edges of walkways and driveways and fix any cracks. Remove anything that might make you trip as you walk through a door, such as a raised step or threshold. Trim any bushes or trees on the path to your home. Use bright outdoor lighting. Clear any walking paths of anything that might make someone trip, such as rocks or tools. Regularly check to see if handrails are loose or broken. Make sure that both sides of any steps have handrails. Any raised decks and porches should have guardrails on the edges. Have any leaves, snow, or ice cleared regularly. Use sand or salt on walking paths during winter. Clean up any spills in your garage right away. This includes oil or grease spills. What can I do in the bathroom? Use night lights. Install grab bars by the toilet and in the tub and shower. Do not use towel bars as grab bars. Use non-skid mats or decals in the tub or shower. If you need to sit down in the shower, use a plastic, non-slip stool. Keep the floor dry. Clean  up any water that spills on the floor as soon as it happens. Remove soap buildup in the tub or shower regularly. Attach bath mats securely with double-sided non-slip rug tape. Do not have throw rugs and other things on the floor that can make you trip. What can I do in the bedroom? Use night lights. Make sure that you have a light by your bed that is easy to reach. Do not use any sheets or blankets that are too big for your bed. They should not hang down onto the floor. Have a firm chair that has side arms. You can use this for support while you get dressed. Do not have throw rugs and other things on the floor that can make you trip. What can I do in the kitchen? Clean up any spills right away. Avoid walking on wet floors. Keep items that you use a lot in easy-to-reach places. If you need to reach something above you, use a strong step stool that has a grab bar. Keep electrical cords out of the way. Do not use floor polish or wax that makes floors slippery. If you must use wax,  use non-skid floor wax. Do not have throw rugs and other things on the floor that can make you trip. What can I do with my stairs? Do not leave any items on the stairs. Make sure that there are handrails on both sides of the stairs and use them. Fix handrails that are broken or loose. Make sure that handrails are as long as the stairways. Check any carpeting to make sure that it is firmly attached to the stairs. Fix any carpet that is loose or worn. Avoid having throw rugs at the top or bottom of the stairs. If you do have throw rugs, attach them to the floor with carpet tape. Make sure that you have a light switch at the top of the stairs and the bottom of the stairs. If you do not have them, ask someone to add them for you. What else can I do to help prevent falls? Wear shoes that: Do not have high heels. Have rubber bottoms. Are comfortable and fit you well. Are closed at the toe. Do not wear sandals. If you  use a stepladder: Make sure that it is fully opened. Do not climb a closed stepladder. Make sure that both sides of the stepladder are locked into place. Ask someone to hold it for you, if possible. Clearly mark and make sure that you can see: Any grab bars or handrails. First and last steps. Where the edge of each step is. Use tools that help you move around (mobility aids) if they are needed. These include: Canes. Walkers. Scooters. Crutches. Turn on the lights when you go into a dark area. Replace any light bulbs as soon as they burn out. Set up your furniture so you have a clear path. Avoid moving your furniture around. If any of your floors are uneven, fix them. If there are any pets around you, be aware of where they are. Review your medicines with your doctor. Some medicines can make you feel dizzy. This can increase your chance of falling. Ask your doctor what other things that you can do to help prevent falls. This information is not intended to replace advice given to you by your health care provider. Make sure you discuss any questions you have with your health care provider. Document Released: 09/29/2009 Document Revised: 05/10/2016 Document Reviewed: 01/07/2015 Elsevier Interactive Patient Education  2017 [employer].

## 2022-12-06 ENCOUNTER — Other Ambulatory Visit: Payer: Medicare Other

## 2022-12-11 IMAGING — DX DG CHEST 2V
2 series · 2 of 2 positions shown · non-contrast
Comparison: 11/25/2020

CLINICAL DATA: Postoperative CABG

EXAM:
CHEST - 2 VIEW

[dg chest 2 view (1 of 2)]
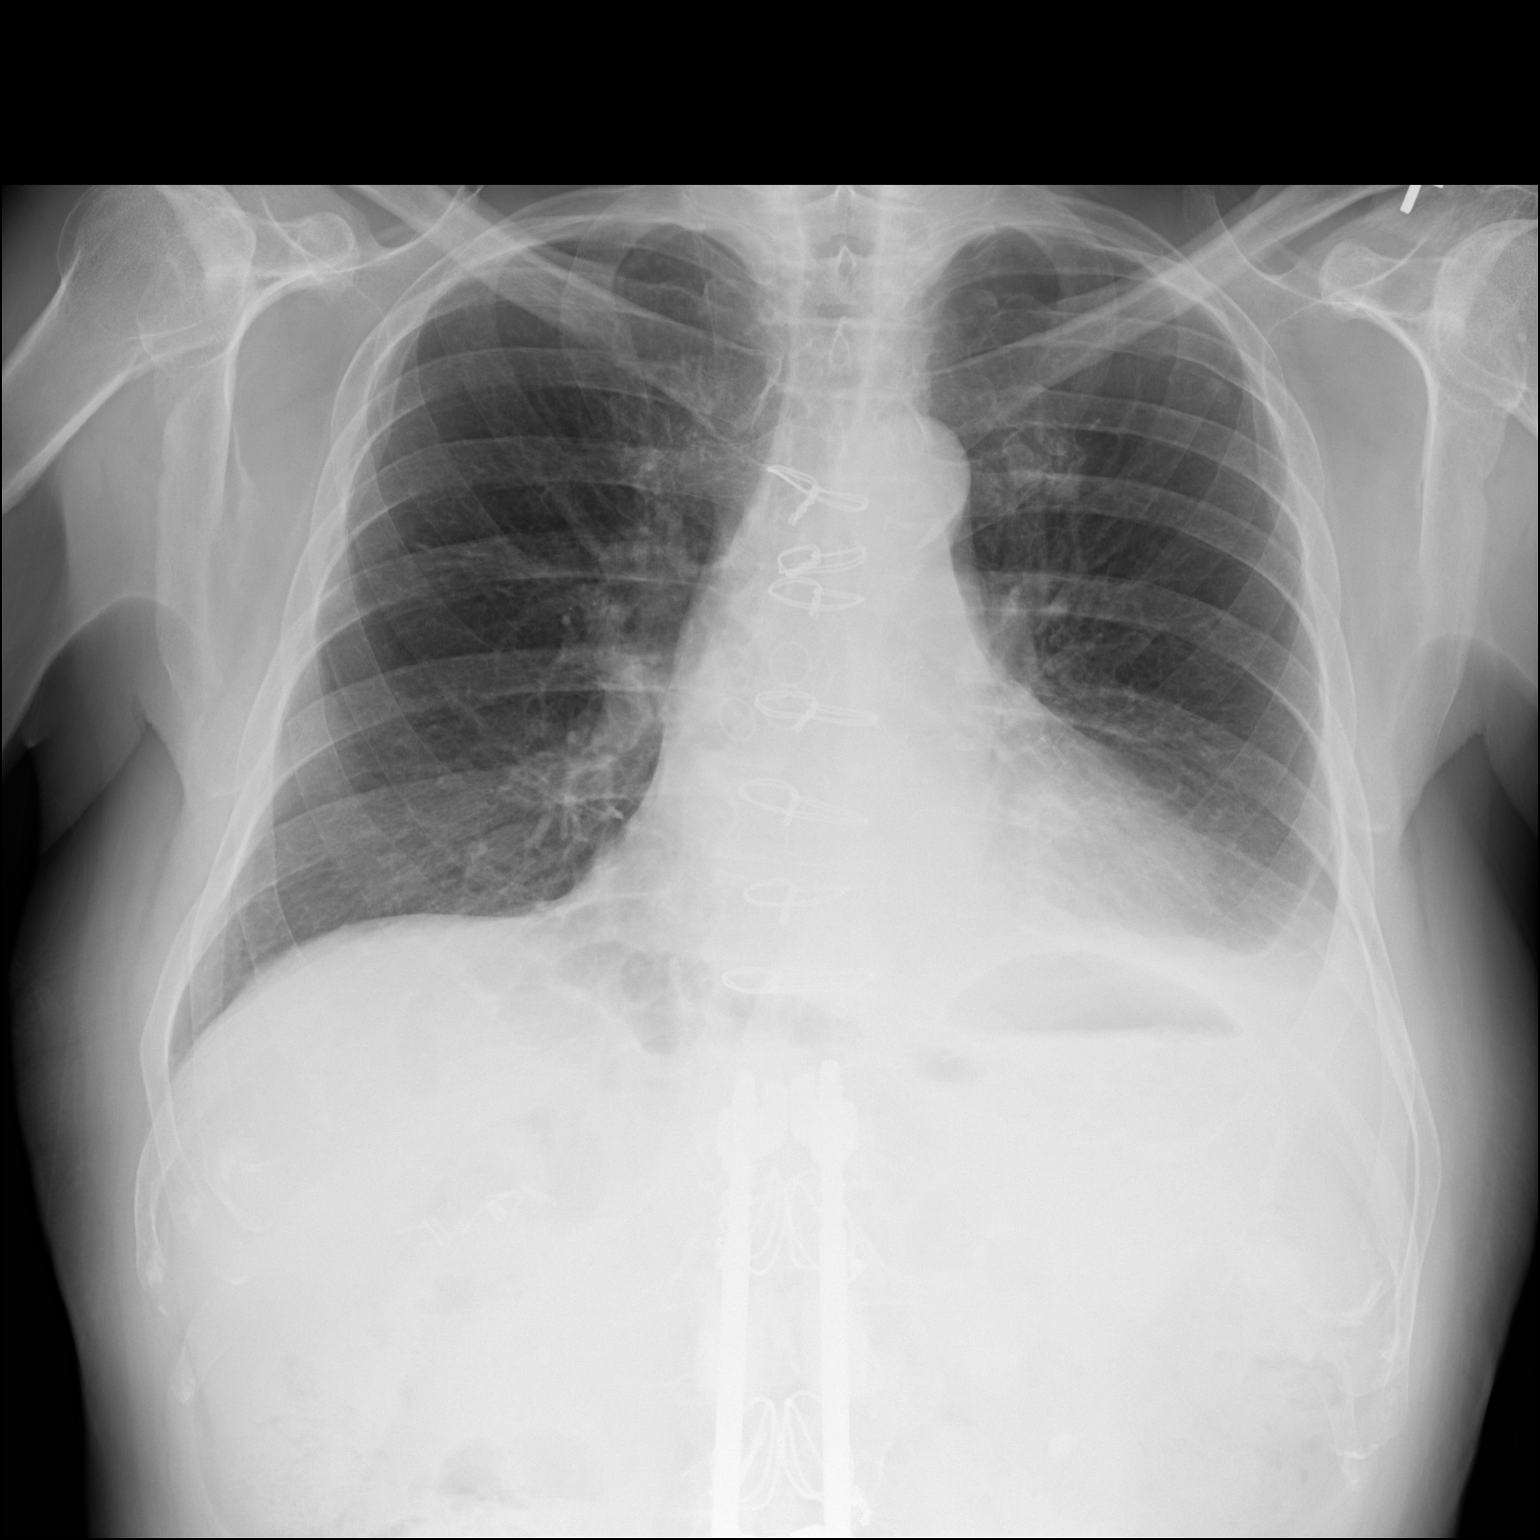

[dg chest 2 view (2 of 2)]
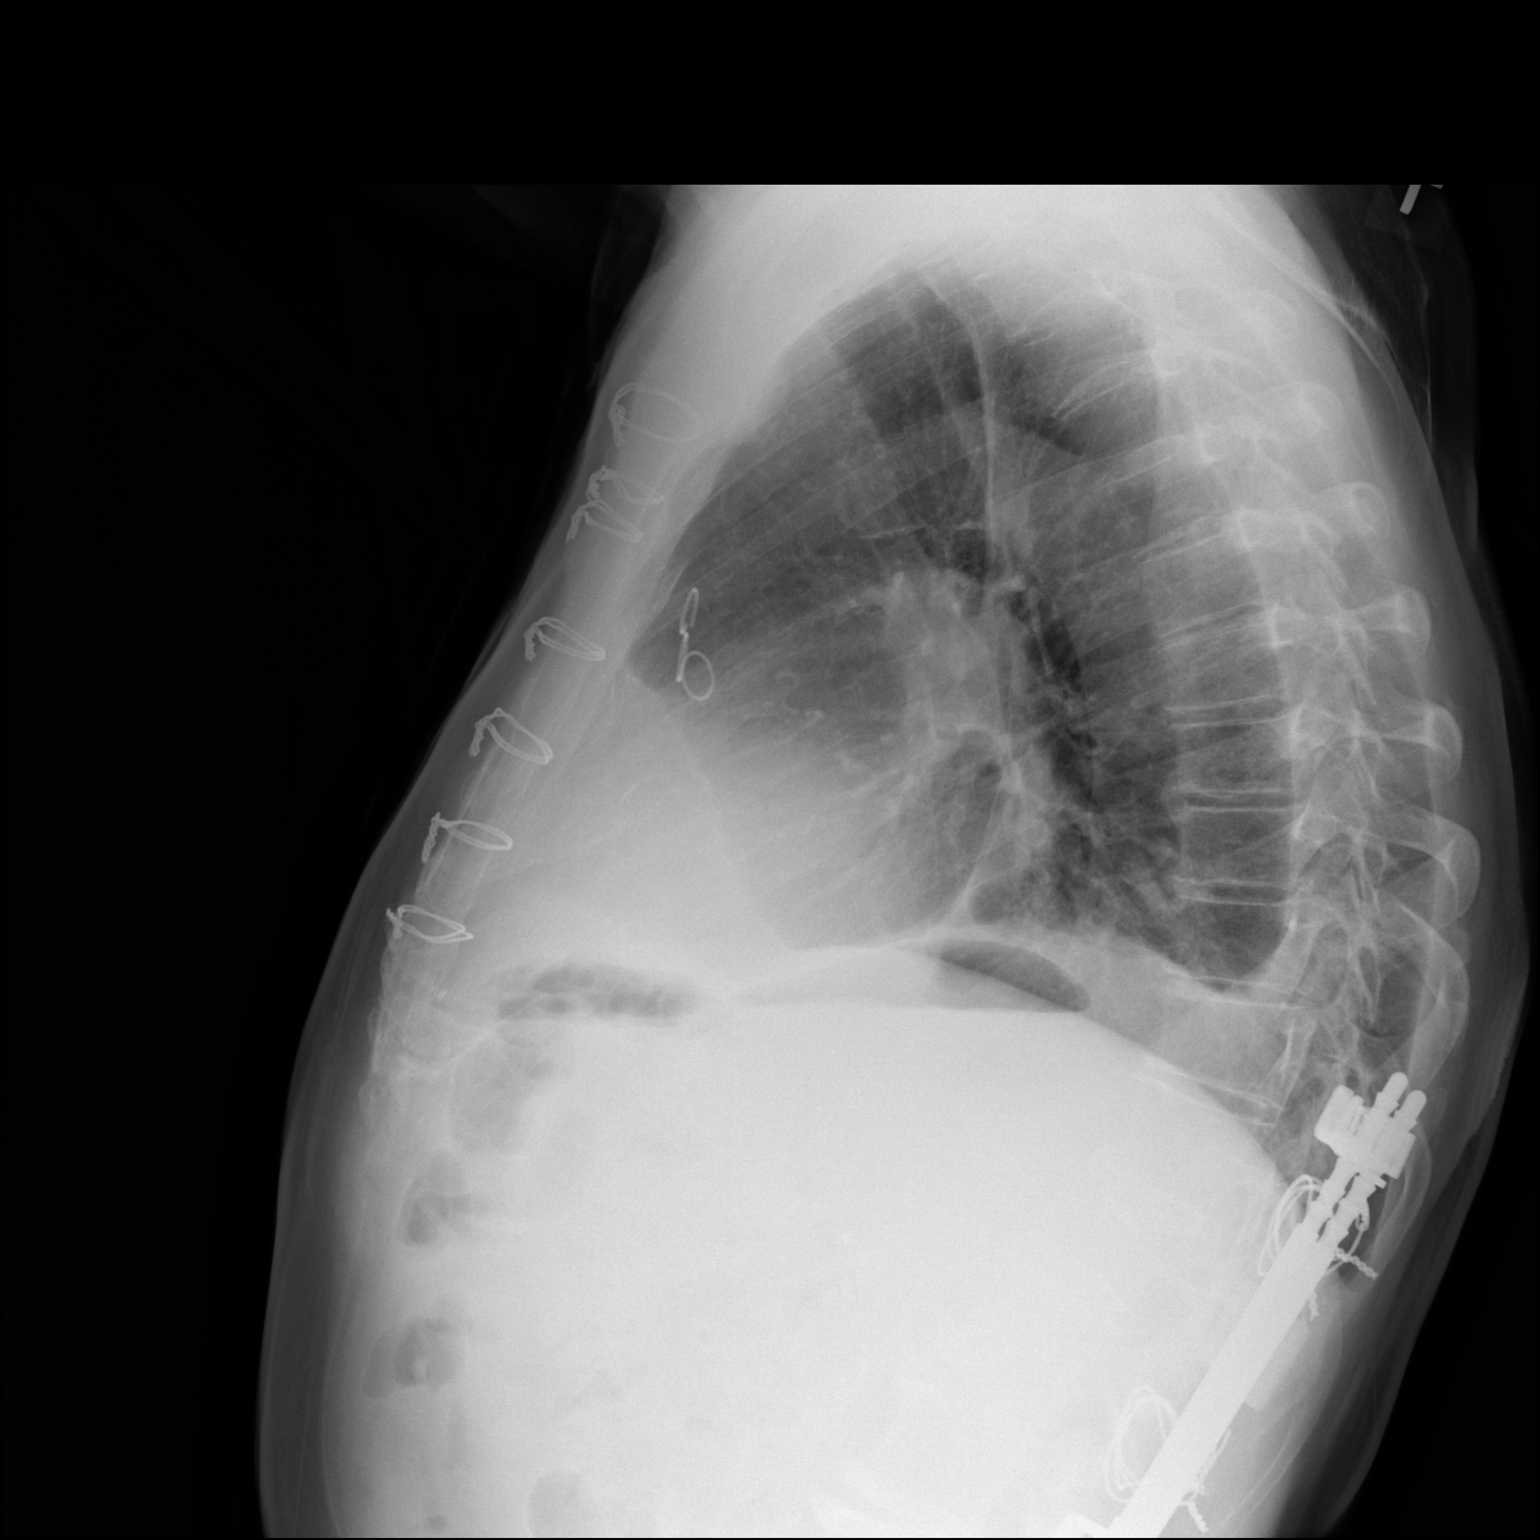

[2 of 2 positions shown; findings below may reference images not displayed]

FINDINGS: Post CABG changes. Stable heart size. Atherosclerotic calcification
of the aortic knob. Small left pleural effusion persists. Hazy left
basilar opacity with improving aeration of the left lung base
compared to prior. Lungs are otherwise clear. No pneumothorax.
IMPRESSION: Persistent small left pleural effusion and hazy left basilar opacity
with improving aeration of the left lung base compared to prior.

## 2022-12-13 ENCOUNTER — Encounter: Payer: Self-pay | Admitting: Family Medicine

## 2022-12-13 ENCOUNTER — Ambulatory Visit (INDEPENDENT_AMBULATORY_CARE_PROVIDER_SITE_OTHER): Payer: Medicare Other | Admitting: Family Medicine

## 2022-12-13 VITALS — BP 150/64 | HR 63 | Temp 98.2°F | Ht 67.0 in | Wt 146.5 lb

## 2022-12-13 DIAGNOSIS — Z0000 Encounter for general adult medical examination without abnormal findings: Secondary | ICD-10-CM | POA: Diagnosis not present

## 2022-12-13 NOTE — Progress Notes (Signed)
Eddie Kettering T. Elyse Prevo, MD, [name] at [facility] [name], [address]  Phone: [phone]  FAX: [place] - 81 y.o. male  MRN [id number]  Date of Birth: 22-Aug-1941  Date: 12/13/2022  PCP: Owens Loffler, MD  Referral: Owens Loffler, MD  Chief Complaint  Patient presents with   Annual Exam    Part 2   Patient Care Team: Owens Loffler, MD as PCP - General Elouise Munroe, MD as PCP - Cardiology (Cardiology) Danella Sensing, MD as Consulting Physician (Dermatology) Katy Apo, MD as Consulting Physician (Ophthalmology) Subjective:   Eddie Fox is a 81 y.o. pleasant patient who presents with the following:  Preventative Health Maintenance Visit:  Health Maintenance Summary Reviewed and updated, unless pt declines services.  Tobacco History Reviewed. Alcohol: No concerns, no excessive use Exercise Habits: Some activity, rec at least 30 mins 5 times a week - deer hunting and a little bit of walking STD concerns: no risk or activity to increase risk Drug Use: None  Flu vaccine  Does have a history of coronary disease, and he is compliant with all of his medications.  Hypertension, compliant. 150/64 - 130/55 on my recheck  Eating well, lots of veggies  - wife died four years ago.   Lipids: Doing well, stable. Tolerating meds fine with no SE. Panel reviewed with patient.  Lipids: Lab Results  Component Value Date   CHOL 96 12/04/2022   Lab Results  Component Value Date   HDL 33.60 (L) 12/04/2022   Lab Results  Component Value Date   LDLCALC 43 12/04/2022   Lab Results  Component Value Date   TRIG 100.0 12/04/2022   Lab Results  Component Value Date   CHOLHDL 3 12/04/2022    Lab Results  Component Value Date   ALT 12 12/04/2022   AST 17 12/04/2022   ALKPHOS 62 12/04/2022   BILITOT 0.6 12/04/2022     Health Maintenance  Topic Date Due    COVID-19 Vaccine (4 - 2023-24 season) 12/21/2022 (Originally 08/17/2022)   INFLUENZA VACCINE  03/17/2023 (Originally 07/17/2022)   Medicare Annual Wellness (AWV)  12/06/2023   DTaP/Tdap/Td (4 - Td or Tdap) 01/02/2032   Pneumonia Vaccine 28+ Years old  Completed   Zoster Vaccines- Shingrix  Completed   HPV VACCINES  Aged Out   Immunization History  Administered Date(s) Administered   Influenza Split 12/25/2011   Influenza, High Dose Seasonal PF 08/31/2020, 09/06/2021, 03/13/2022   Influenza,inj,Quad PF,6+ Mos 08/19/2013, 08/26/2014, 09/14/2015, 09/11/2016, 08/28/2017, 08/27/2018, 08/10/2019   PFIZER(Purple Top)SARS-COV-2 Vaccination 01/06/2020, 01/27/2020   Pfizer Covid-19 Vaccine Bivalent Booster 22yr & up 09/07/2021   Pneumococcal Conjugate-13 02/16/2015   Pneumococcal Polysaccharide-23 06/28/2022   Td 03/24/2002, 12/22/2009   Tdap 01/01/2022   Zoster Recombinat (Shingrix) 01/03/2022, 03/13/2022   Patient Active Problem List   Diagnosis Date Noted   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    Priority: High   CAD (coronary artery disease), native coronary artery 11/18/2020    Priority: High   [place] carcinoma [place] 06/14/2016    Priority: High   [place]an[place] of [place] wall ([place] 01/12/2014    Priority: Medium    Essential hypertension 09/14/2008    Priority: Medium    Hyperlipidemia 07/08/2007    Priority: Medium    Atrial bigeminy 09/26/2020   Vitamin D deficiency 01/30/2010   FATTY LIVER DISEASE 08/12/2009   ERECTILE DYSFUNCTION 07/08/2007   PEYRONIE'S DISEASE, 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  Past Medical History:  Diagnosis Date   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    CAD (coronary artery disease), native coronary artery 11/18/2020   ED (erectile dysfunction)    IBS (irritable bowel syndrome)    Melanoma ([place])    history of   Other and unspecified hyperlipidemia    Other chronic nonalcoholic liver disease    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   Unspecified essential  hypertension    Unspecified vitamin D deficiency     Past Surgical History:  Procedure Laterality Date   AMPUTATION     distal phalanx of right thumb- traumatic   CHOLECYSTECTOMY  10/03/09   lap   CORONARY ARTERY BYPASS GRAFT N/A 11/21/2020   Procedure: CORONARY ARTERY BYPASS GRAFTING (CABG) X 4, USING LEFT INTERNAL MAMMARY ARTERY AND RIGHT LEG GREATER SAPHENOUS VEIN HARVESTED ENDOSCOPICALLY;  Surgeon: Lajuana Matte, MD;  Location: [place];  Service: Open Heart Surgery;  Laterality: N/A;   LAMINECTOMY  1995   L 3-T11 Harrington rods in back   LAPAROSCOPIC APPENDECTOMY N/A 05/16/2016   Procedure: APPENDECTOMY LAPAROSCOPIC;  Surgeon: Greer Pickerel, MD;  Location: [place];  Service: General;  Laterality: N/A;   LEFT HEART CATH AND CORONARY ANGIOGRAPHY N/A 11/18/2020   Procedure: LEFT HEART CATH AND CORONARY ANGIOGRAPHY;  Surgeon: Troy Sine, MD;  Location: [place] [facility];  Service: Cardiovascular;  Laterality: N/A;   MELANOMA EXCISION  1995   right upper abd, Dr. Donita Brooks HEAD EXCISION Right 06/23/2020   Procedure: RIGHT 5TH METATARSAL OSTEOTOMY WITH BUNIONETTE EXCISION, OPEN REDUCTION OF FIFTH METATARSAL PHALANGEAL JOINT, FIFTH METATARSAL PHALANGEAL CAPSULOTOMY, BURSECTOMY;  Surgeon: Erle Crocker, MD;  Location: [place];  Service: Orthopedics;  Laterality: Right;  LENGTH OF SURGERY: 1.5 HOURS   ORIF ANKLE FRACTURE  1967   TEE WITHOUT CARDIOVERSION N/A 11/21/2020   Procedure: TRANSESOPHAGEAL ECHOCARDIOGRAM (TEE);  Surgeon: Lajuana Matte, MD;  Location: [place];  Service: Open Heart Surgery;  Laterality: N/A;    Family History  Problem Relation Age of Onset   Heart disease Mother        CHF   Hypertension Mother    Diabetes Mother    Stroke Mother    Hypertension Father    Heart disease Father        CHF   Hypertension Sister    Hypertension Sister    Depression Neg Hx    Alcohol abuse Neg Hx    Drug abuse Neg Hx     Social History    Social History Narrative   Not on file    Past Medical History, Surgical History, Social History, Family History, Problem List, Medications, and Allergies have been reviewed and updated if relevant.  Review of Systems: Pertinent positives are listed above.  Otherwise, a full 14 point review of systems has been done in full and it is negative except where it is noted positive.  Objective:   BP (!) 150/64   Pulse 63   Temp 98.2 F (36.8 C) (Oral)   Ht 5' 7"  (1.702 m)   Wt 146 lb 8 oz (66.5 kg)   SpO2 98%   BMI 22.95 kg/m  Ideal Body Weight: Weight in (lb) to have BMI = 25: 159.3  Ideal Body Weight: Weight in (lb) to have BMI = 25: 159.3 No results found.    12/05/2022   12:42 PM 12/04/2021   12:13 PM 04/20/2021   12:06 PM 11/23/2019  2:44 PM 11/20/2018   12:56 PM  Depression screen PHQ 2/9  Decreased Interest 0 0 0 0 0  Down, Depressed, Hopeless 0 0 0 0 0  PHQ - 2 Score 0 0 0 0 0  Altered sleeping   0 0 0  Tired, decreased energy   0 0 0  Change in appetite   0 0 0  Feeling bad or failure about yourself    0 0 0  Trouble concentrating   0 0 0  Moving slowly or fidgety/restless   0 0 0  Suicidal thoughts   0 0 0  PHQ-9 Score   0 0 0  Difficult doing work/chores   Not difficult at all Not difficult at all Not difficult at all     GEN: well developed, well nourished, no acute distress Eyes: conjunctiva and lids normal, PERRLA, EOMI ENT: TM clear, nares clear, oral exam WNL Neck: supple, no lymphadenopathy, no thyromegaly, no JVD Pulm: clear to auscultation and percussion, respiratory effort normal CV: regular rate and rhythm, S1-S2, no murmur, rub or gallop, no bruits, peripheral pulses normal and symmetric, no cyanosis, clubbing, edema or varicosities GI: soft, non-tender; no hepatosplenomegaly, masses; active bowel sounds all quadrants GU: deferred Lymph: no cervical, axillary or inguinal adenopathy MSK: gait normal, muscle tone and strength WNL, no joint  swelling, effusions, discoloration, crepitus  SKIN: clear, good turgor, color WNL, no rashes, lesions, or ulcerations Neuro: normal mental status, normal strength, sensation, and motion Psych: alert; oriented to person, place and time, normally interactive and not anxious or depressed in appearance.  All labs reviewed with patient. Results for orders placed or performed in visit on 12/04/22  CEA  Result Value Ref Range   CEA <2.0 ng/mL  Lipid panel  Result Value Ref Range   Cholesterol 96 0 - 200 mg/dL   Triglycerides 100.0 0.0 - 149.0 mg/dL   HDL 33.60 (L) >39.00 mg/dL   VLDL 20.0 0.0 - 40.0 mg/dL   LDL Cholesterol 43 0 - 99 mg/dL   Total CHOL/HDL Ratio 3    NonHDL 62.62   Hepatic function panel  Result Value Ref Range   Total Bilirubin 0.6 0.2 - 1.2 mg/dL   Bilirubin, Direct 0.2 0.0 - 0.3 mg/dL   Alkaline Phosphatase 62 39 - 117 U/L   AST 17 0 - 37 U/L   ALT 12 0 - 53 U/L   Total Protein 6.4 6.0 - 8.3 g/dL   Albumin 4.1 3.5 - 5.2 g/dL  CBC with Differential/Platelet  Result Value Ref Range   WBC 7.4 4.0 - 10.5 K/uL   RBC 4.54 4.22 - 5.81 Mil/uL   Hemoglobin 15.3 13.0 - 17.0 g/dL   HCT 44.5 39.0 - 52.0 %   MCV 98.1 78.0 - 100.0 fl   MCHC 34.4 30.0 - 36.0 g/dL   RDW 12.7 11.5 - 15.5 %   Platelets 226.0 150.0 - 400.0 K/uL   Neutrophils Relative % 48.1 43.0 - 77.0 %   Lymphocytes Relative 43.3 12.0 - 46.0 %   Monocytes Relative 5.1 3.0 - 12.0 %   Eosinophils Relative 2.9 0.0 - 5.0 %   Basophils Relative 0.6 0.0 - 3.0 %   Neutro Abs 3.6 1.4 - 7.7 K/uL   Lymphs Abs 3.2 0.7 - 4.0 K/uL   Monocytes Absolute 0.4 0.1 - 1.0 K/uL   Eosinophils Absolute 0.2 0.0 - 0.7 K/uL   Basophils Absolute 0.0 0.0 - 0.1 K/uL  Basic metabolic panel  Result Value Ref  Range   Sodium 142 135 - 145 mEq/L   Potassium 4.2 3.5 - 5.1 mEq/L   Chloride 106 96 - 112 mEq/L   CO2 28 19 - 32 mEq/L   Glucose, Bld 86 70 - 99 mg/dL   BUN 25 (H) 6 - 23 mg/dL   Creatinine, Ser 1.26 0.40 - 1.50 mg/dL   GFR  53.83 (L) >60.00 mL/min   Calcium 9.4 8.4 - 10.5 mg/dL    Assessment and Plan:     ICD-10-CM   1. Healthcare maintenance  Z00.00      Globally doing well.  He is eating well.  He has not been getting as much exercise, and he is getting out and walking a little bit when he is deer hunting.  He is going to check with his pharmacy about coverage for influenza vaccination.  Health Maintenance Exam: The patient's preventative maintenance and recommended screening tests for an annual wellness exam were reviewed in full today. Brought up to date unless services declined.  Counselled on the importance of diet, exercise, and its role in overall health and mortality. The patient's FH and SH was reviewed, including their home life, tobacco status, and drug and alcohol status.  Follow-up in 1 year for physical exam or additional follow-up below.  Disposition: No follow-ups on file.  No orders of the defined types were placed in this encounter.  There are no discontinued medications. No orders of the defined types were placed in this encounter.   Signed,  Maud Deed. Tausha Milhoan, MD   Allergies as of 12/13/2022       Reactions   Hydrocodone    pts states made him sick.   Pneumococcal Vaccine Swelling   REACTION: significant arm swelling, redness        Medication List        Accurate as of December 13, 2022  1:38 PM. If you have any questions, ask your nurse or doctor.          acetaminophen 500 MG tablet Commonly known as: TYLENOL Take 1,000 mg by mouth every 6 (six) hours as needed for mild pain or headache.   aspirin EC 81 MG tablet Take 1 tablet (81 mg total) by mouth daily.   atorvastatin 80 MG tablet Commonly known as: LIPITOR Take 1 tablet (80 mg total) by mouth daily.   fluticasone 50 MCG/ACT nasal spray Commonly known as: FLONASE USE 2 SPRAYS IN BOTH  NOSTRILS ONCE DAILY AS  NEEDED   ipratropium 0.06 % nasal spray Commonly known as: ATROVENT INSTILL 2  SPRAYS IN BOTH  NOSTRILS 4 TIMES DAILY AS NEEDED FOR RUNNY NOSE   lisinopril 40 MG tablet Commonly known as: ZESTRIL TAKE 1 TABLET BY MOUTH DAILY   MULTIVITAMIN MEN 50+ PO Take 1 tablet by mouth daily.   nitroGLYCERIN 0.4 MG SL tablet Commonly known as: NITROSTAT Place 1 tablet (0.4 mg total) under the tongue every 5 (five) minutes as needed for chest pain.   pantoprazole 40 MG tablet Commonly known as: PROTONIX TAKE 1 TABLET BY MOUTH DAILY   Vitamin D3/Calcium/Phosphorus 120-100-78 UNIT-MG Tabs Take 1 tablet by mouth.

## 2023-01-22 ENCOUNTER — Telehealth: Payer: Self-pay | Admitting: Family Medicine

## 2023-01-22 NOTE — Telephone Encounter (Signed)
Dawn,   Can you check on this - I think that I coded it correctly, but I am not sure.  Please let me know if I need to do anything differently.

## 2023-01-22 NOTE — Telephone Encounter (Signed)
Patient called stating that a procedure  was done on 12/13/22 where a place was cut off his foot by Dr Elberta [facility] is denying paying for it,they are telling the patient that the claim needs to be sent in again and reworded a certain way in order for it to be paid for by them.

## 2023-03-09 ENCOUNTER — Other Ambulatory Visit: Payer: Self-pay | Admitting: Family Medicine

## 2023-04-02 ENCOUNTER — Other Ambulatory Visit: Payer: Self-pay | Admitting: Internal Medicine

## 2023-04-11 ENCOUNTER — Telehealth: Payer: Self-pay | Admitting: Internal Medicine

## 2023-04-11 MED ORDER — LISINOPRIL 40 MG PO TABS: 40.0000 mg | ORAL_TABLET | Freq: Every day | ORAL | 2 refills | Status: DC

## 2023-04-11 NOTE — Telephone Encounter (Signed)
*  STAT* If patient is at the pharmacy, call can be transferred to refill team.   1. Which medications need to be refilled? (please list name of each medication and dose if known)  lisinopril (ZESTRIL) 40 MG tablet   2. Which pharmacy/location (including street and city if local pharmacy) is medication to be sent to? [facility]) - [place], [place] - [address] [place]    3. Do they need a 30 day or 90 day supply? 90 Day Supply

## 2023-04-16 ENCOUNTER — Ambulatory Visit: Payer: Medicare Other | Admitting: Podiatry

## 2023-04-16 DIAGNOSIS — M79676 Pain in unspecified toe(s): Secondary | ICD-10-CM | POA: Diagnosis not present

## 2023-04-16 DIAGNOSIS — B351 Tinea unguium: Secondary | ICD-10-CM | POA: Diagnosis not present

## 2023-04-16 DIAGNOSIS — D2371 Other benign neoplasm of skin of right lower limb, including hip: Secondary | ICD-10-CM

## 2023-04-16 DIAGNOSIS — D2372 Other benign neoplasm of skin of left lower limb, including hip: Secondary | ICD-10-CM | POA: Diagnosis not present

## 2023-04-16 NOTE — Progress Notes (Signed)
Subjective:  Patient ID: Eddie Fox, male    DOB: 1941/10/09,  MRN: [id number] HPI No chief complaint on file.   82 y.o. male presents with the above complaint.   ROS: Denies fever chills nausea vomit muscle aches pains calf pain back pain chest pain shortness of breath.  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   Unspecified essential hypertension    Unspecified vitamin D deficiency    Past Surgical History:  Procedure Laterality Date   AMPUTATION     distal phalanx of right thumb- traumatic   CHOLECYSTECTOMY  10/03/09   lap   CORONARY ARTERY BYPASS GRAFT N/A 11/21/2020   Procedure: CORONARY ARTERY BYPASS GRAFTING (CABG) X 4, USING LEFT INTERNAL MAMMARY ARTERY AND RIGHT LEG GREATER SAPHENOUS VEIN HARVESTED ENDOSCOPICALLY;  Surgeon: Corliss Skains, MD;  Location: [facility];  Service: Open Heart Surgery;  Laterality: N/A;   LAMINECTOMY  1995   L 3-T11 Harrington rods in back   LAPAROSCOPIC APPENDECTOMY N/A 05/16/2016   Procedure: APPENDECTOMY LAPAROSCOPIC;  Surgeon: Gaynelle Adu, MD;  Location: [facility] OR;  Service: General;  Laterality: N/A;   LEFT HEART CATH AND CORONARY ANGIOGRAPHY N/A 11/18/2020   Procedure: LEFT HEART CATH AND CORONARY ANGIOGRAPHY;  Surgeon: Lennette Bihari, MD;  Location: [facility];  Service: Cardiovascular;  Laterality: N/A;   MELANOMA EXCISION  1995   right upper abd, Dr. Cathlyn Parsons HEAD EXCISION Right 06/23/2020   Procedure: RIGHT 5TH METATARSAL OSTEOTOMY WITH BUNIONETTE EXCISION, OPEN REDUCTION OF FIFTH METATARSAL PHALANGEAL JOINT, FIFTH METATARSAL PHALANGEAL CAPSULOTOMY, BURSECTOMY;  Surgeon: Terance Hart, MD;  Location: [facility];  Service: Orthopedics;  Laterality: Right;  LENGTH OF SURGERY: 1.5 HOURS   ORIF ANKLE FRACTURE  1967   TEE WITHOUT CARDIOVERSION N/A 11/21/2020   Procedure: TRANSESOPHAGEAL ECHOCARDIOGRAM (TEE);  Surgeon: Corliss Skains, MD;  Location: [facility] OR;  Service: Open Heart Surgery;  Laterality: N/A;    Current Outpatient Medications:    acetaminophen (TYLENOL) 500 MG tablet, Take 1,000 mg by mouth every 6 (six) hours as needed for mild pain or headache., Disp: , Rfl:    aspirin 81 MG tablet, Take 1 tablet (81 mg total) by mouth daily., Disp: , Rfl:    atorvastatin (LIPITOR) 80 MG tablet, Take 1 tablet (80 mg total) by mouth daily., Disp: 90 tablet, Rfl: 3   Calcium-Phosphorus-Vitamin D (VITAMIN D3/CALCIUM/PHOSPHORUS) 120-100-78 UNIT-MG TABS, Take 1 tablet by mouth., Disp: , Rfl:    fluticasone (FLONASE) 50 MCG/ACT nasal spray, USE 2 SPRAYS IN BOTH  NOSTRILS ONCE DAILY AS  NEEDED, Disp: 48 g, Rfl: 0   ipratropium (ATROVENT) 0.06 % nasal spray, INSTILL 2 SPRAYS IN BOTH  NOSTRILS 4 TIMES DAILY AS NEEDED FOR RUNNY NOSE, Disp: 45 mL, Rfl: 3   lisinopril (ZESTRIL) 40 MG tablet, Take 1 tablet (40 mg total) by mouth daily., Disp: 90 tablet, Rfl: 2   Multiple Vitamins-Minerals (MULTIVITAMIN MEN 50+ PO), Take 1 tablet by mouth daily., Disp: , Rfl:    nitroGLYCERIN (NITROSTAT) 0.4 MG SL tablet, Place 1 tablet (0.4 mg total) under the tongue  every 5 (five) minutes as needed for chest pain., Disp: 90 tablet, Rfl: 3   pantoprazole (PROTONIX) 40 MG tablet, TAKE 1 TABLET BY MOUTH DAILY, Disp: 100 tablet, Rfl: 1  Allergies  Allergen Reactions   Hydrocodone     pts states made him sick.   Pneumococcal Vaccine Swelling    REACTION: significant arm swelling, redness   Review of Systems Objective:  There were no vitals filed for this visit.  General: Well developed, nourished, in no acute distress, alert and oriented x3   Dermatological: Skin is warm, dry  and supple bilateral. Nails x 10 are well maintained; remaining integument appears unremarkable at this time. There are no open sores, no preulcerative lesions, no rash or signs of infection present.  Multiple porokeratotic lesions beneath the metatarsal heads of the bilateral foot severe fat atrophy is noted bilaterally.  This is resulting in his complications.  Vascular: Dorsalis Pedis artery and Posterior Tibial artery pedal pulses are 2/4 bilateral with immedate capillary fill time. Pedal hair growth present. No varicosities and no lower extremity edema present bilateral.   Neruologic: Grossly intact via light touch bilateral. Vibratory intact via tuning fork bilateral. Protective threshold with Semmes Wienstein monofilament intact to all pedal sites bilateral. Patellar and Achilles deep tendon reflexes 2+ bilateral. No Babinski or clonus noted bilateral.   Musculoskeletal: No gross boney pedal deformities bilateral. No pain, crepitus, or limitation noted with foot and ankle range of motion bilateral. Muscular strength 5/5 in all groups tested bilateral.  Gait: Unassisted, Nonantalgic.    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  Plan: Discussed etiology pathology and surgical therapies at this point and debrided all benign skin lesions bilaterally discussed appropriate shoe gear follow-up with him on an as-needed basis.     Sansa Alkema T. [place], [place]

## 2023-06-17 DIAGNOSIS — Z85820 Personal history of malignant melanoma of skin: Secondary | ICD-10-CM | POA: Diagnosis not present

## 2023-06-17 DIAGNOSIS — D0439 Carcinoma in situ of skin of other parts of face: Secondary | ICD-10-CM | POA: Diagnosis not present

## 2023-06-17 DIAGNOSIS — L821 Other seborrheic keratosis: Secondary | ICD-10-CM | POA: Diagnosis not present

## 2023-06-17 DIAGNOSIS — Z85828 Personal history of other malignant neoplasm of skin: Secondary | ICD-10-CM | POA: Diagnosis not present

## 2023-06-17 DIAGNOSIS — D692 Other nonthrombocytopenic purpura: Secondary | ICD-10-CM | POA: Diagnosis not present

## 2023-06-17 DIAGNOSIS — L570 Actinic keratosis: Secondary | ICD-10-CM | POA: Diagnosis not present

## 2023-06-17 DIAGNOSIS — L905 Scar conditions and fibrosis of skin: Secondary | ICD-10-CM | POA: Diagnosis not present

## 2023-06-17 DIAGNOSIS — D045 Carcinoma in situ of skin of trunk: Secondary | ICD-10-CM | POA: Diagnosis not present

## 2023-06-18 ENCOUNTER — Ambulatory Visit: Payer: Medicare Other | Admitting: Podiatry

## 2023-06-18 ENCOUNTER — Encounter: Payer: Self-pay | Admitting: Podiatry

## 2023-06-18 DIAGNOSIS — D2371 Other benign neoplasm of skin of right lower limb, including hip: Secondary | ICD-10-CM

## 2023-06-18 DIAGNOSIS — B351 Tinea unguium: Secondary | ICD-10-CM

## 2023-06-18 DIAGNOSIS — D2372 Other benign neoplasm of skin of left lower limb, including hip: Secondary | ICD-10-CM

## 2023-06-18 DIAGNOSIS — M79676 Pain in unspecified toe(s): Secondary | ICD-10-CM | POA: Diagnosis not present

## 2023-06-18 NOTE — Progress Notes (Signed)
He presents today chief complaint of painful elongated toenails and calluses plantar aspect of the forefoot bilaterally.  States that after last visit he was able to get out and exercise.  He was very happy with the results at that time.  Objective: Pulses remain palpable hammertoe deformities cavus foot deformities noted bilateral he does have small benign skin lesions no open lesions or wounds at this point.  His toenails are longer thick or dystrophic clinically mycotic painful palpation as well as debridement.  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  Plan: Debridement of benign skin lesions debridement of toenails 1 through 5 bilateral.

## 2023-06-19 ENCOUNTER — Encounter: Payer: Self-pay | Admitting: Internal Medicine

## 2023-06-19 ENCOUNTER — Ambulatory Visit: Payer: Medicare Other | Attending: Internal Medicine | Admitting: Internal Medicine

## 2023-06-19 VITALS — BP 158/72 | HR 77 | Ht 70.0 in | Wt 144.8 lb

## 2023-06-19 DIAGNOSIS — I10 Essential (primary) hypertension: Secondary | ICD-10-CM

## 2023-06-19 DIAGNOSIS — I2510 Atherosclerotic heart disease of native coronary artery without angina pectoris: Secondary | ICD-10-CM

## 2023-06-19 DIAGNOSIS — I493 Ventricular premature depolarization: Secondary | ICD-10-CM

## 2023-06-19 DIAGNOSIS — I340 Nonrheumatic mitral (valve) insufficiency: Secondary | ICD-10-CM | POA: Diagnosis not present

## 2023-06-19 DIAGNOSIS — E785 Hyperlipidemia, unspecified: Secondary | ICD-10-CM

## 2023-06-19 DIAGNOSIS — I491 Atrial premature depolarization: Secondary | ICD-10-CM

## 2023-06-19 DIAGNOSIS — I351 Nonrheumatic aortic (valve) insufficiency: Secondary | ICD-10-CM | POA: Diagnosis not present

## 2023-06-19 DIAGNOSIS — Z951 Presence of aortocoronary bypass graft: Secondary | ICD-10-CM

## 2023-06-19 NOTE — Progress Notes (Signed)
Cardiology Office Note:    Date:  06/19/2023   ID:  ARVIS WARMKESSEL, DOB 1941-10-06, MRN [id number]  PCP:  Hannah Beat, MD  Cardiologist:  Parke Poisson, MD  Electrophysiologist:  None   Referring MD: Hannah Beat, MD   Chief Complaint/Reason for Referral: F/u CAD s/p CABG  History of Present Illness:    Eddie Fox is a 82 y.o. male with a history of CAD s/p CABG (CABG x4 LIMA-LAD, SVG-PDA, SVG-OM 2, SVG-D1 in 12/21), PACs, PVCs, mitral valve insufficiency, aortic valve insufficiency, HTN, and HLD.  Feels well overall.  Notes that his blood pressure is mildly elevated today but generally runs in the 140s at home.  We discussed that age 7, aggressive blood pressure control may be deleterious, we will observe this for a bit longer, but did discuss that at our 52-month follow-up we could consider addition of a low-dose of additional blood pressure control.   Notes that his legs feel somewhat weak but it is not described as myalgias.  He feels his legs have been weak since bypass surgery in 2021.  Has not required any nitroglycerin since bypass surgery.  Continues to be active hunting and mowing his own grass.  In addition he has been exercising per our recommendations with a stationary bicycle as well as some free weights and seated to standing activities.  No chest pain, no shortness of breath, no syncope, no significant palpitations.    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   Unspecified essential hypertension    Unspecified vitamin D deficiency     Past Surgical History:  Procedure Laterality Date   AMPUTATION     distal  phalanx of right thumb- traumatic   CHOLECYSTECTOMY  10/03/09   lap   CORONARY ARTERY BYPASS GRAFT N/A 11/21/2020   Procedure: CORONARY ARTERY BYPASS GRAFTING (CABG) X 4, USING LEFT INTERNAL MAMMARY ARTERY AND RIGHT LEG GREATER SAPHENOUS VEIN HARVESTED ENDOSCOPICALLY;  Surgeon: Corliss Skains, MD;  Location: [facility] OR;  Service: Open Heart Surgery;  Laterality: N/A;   LAMINECTOMY  1995   L 3-T11 Harrington rods in back   LAPAROSCOPIC APPENDECTOMY N/A 05/16/2016   Procedure: APPENDECTOMY LAPAROSCOPIC;  Surgeon: Gaynelle Adu, MD;  Location: [facility] OR;  Service: General;  Laterality: N/A;   LEFT HEART CATH AND CORONARY ANGIOGRAPHY N/A 11/18/2020   Procedure: LEFT HEART CATH AND CORONARY ANGIOGRAPHY;  Surgeon: Lennette Bihari, MD;  Location: [facility] INVASIVE CV LAB;  Service: Cardiovascular;  Laterality: N/A;   MELANOMA EXCISION  1995   right upper abd, Dr. Cathlyn Parsons HEAD EXCISION Right 06/23/2020   Procedure: RIGHT 5TH METATARSAL OSTEOTOMY WITH BUNIONETTE EXCISION, OPEN REDUCTION OF FIFTH METATARSAL PHALANGEAL JOINT, FIFTH METATARSAL PHALANGEAL CAPSULOTOMY, BURSECTOMY;  Surgeon: Terance Hart, MD;  Location:  SURGERY CENTER;  Service: Orthopedics;  Laterality: Right;  LENGTH OF SURGERY: 1.5 HOURS   ORIF ANKLE FRACTURE  1967   TEE WITHOUT CARDIOVERSION N/A 11/21/2020   Procedure: TRANSESOPHAGEAL ECHOCARDIOGRAM (TEE);  Surgeon: Corliss Skains, MD;  Location: [facility] OR;  Service: Open Heart Surgery;  Laterality: N/A;  Current Medications: Current Meds  Medication Sig   acetaminophen (TYLENOL) 500 MG tablet Take 1,000 mg by mouth every 6 (six) hours as needed for mild pain or headache.   aspirin 81 MG tablet Take 1 tablet (81 mg total) by mouth daily.   atorvastatin (LIPITOR) 80 MG tablet Take 1 tablet (80 mg total) by mouth daily.   Calcium-Phosphorus-Vitamin D (VITAMIN D3/CALCIUM/PHOSPHORUS) 120-100-78 UNIT-MG TABS Take 1 tablet by mouth.   fluticasone (FLONASE) 50 MCG/ACT nasal  spray USE 2 SPRAYS IN BOTH  NOSTRILS ONCE DAILY AS  NEEDED   ipratropium (ATROVENT) 0.06 % nasal spray INSTILL 2 SPRAYS IN BOTH  NOSTRILS 4 TIMES DAILY AS NEEDED FOR RUNNY NOSE   lisinopril (ZESTRIL) 40 MG tablet Take 1 tablet (40 mg total) by mouth daily.   Multiple Vitamins-Minerals (MULTIVITAMIN MEN 50+ PO) Take 1 tablet by mouth daily.   nitroGLYCERIN (NITROSTAT) 0.4 MG SL tablet Place 1 tablet (0.4 mg total) under the tongue every 5 (five) minutes as needed for chest pain.   pantoprazole (PROTONIX) 40 MG tablet TAKE 1 TABLET BY MOUTH DAILY     Allergies:   Hydrocodone and Pneumococcal vaccine   Social History   Tobacco Use   Smoking status: Never   Smokeless tobacco: Never  Vaping Use   Vaping Use: Never used  Substance Use Topics   Alcohol use: No   Drug use: No     Family History: The patient's family history includes Diabetes in his mother; Heart disease in his father and mother; Hypertension in his father, mother, sister, and sister; Stroke in his mother. There is no history of Depression, Alcohol abuse, or Drug abuse.  ROS:   Please see the history of present illness.    All other systems reviewed and are negative.  EKGs/Labs/Other Studies Reviewed:    The following studies were reviewed today:  EKG:   EKG Interpretation Date/Time:  Wednesday June 19 2023 08:14:25 EDT Ventricular Rate:  67 PR Interval:  160 QRS Duration:  100 QT Interval:  400 QTC Calculation: 422 R Axis:   -53  Text Interpretation: Normal sinus rhythm Left anterior fascicular block Nonspecific ST abnormality When compared with ECG of 22-Nov-2020 06:35, Premature ventricular complexes are no longer Present Confirmed by Weston Brass ([id number]) on 06/19/2023 8:54:54 AM   Imaging studies that I have independently reviewed today: N/A  Recent Labs: 12/04/2022: ALT 12; BUN 25; Creatinine, Ser 1.26; Hemoglobin 15.3; Platelets 226.0; Potassium 4.2; Sodium 142  Recent Lipid Panel    Component Value  Date/Time   CHOL 96 12/04/2022 0854   TRIG 100.0 12/04/2022 0854   HDL 33.60 (L) 12/04/2022 0854   CHOLHDL 3 12/04/2022 0854   VLDL 20.0 12/04/2022 0854   LDLCALC 43 12/04/2022 0854   LDLDIRECT 171.9 07/08/2007 1044    Physical Exam:    VS:  BP (!) 158/72   Pulse 77   Ht 5\' 10"  (1.778 m)   Wt 144 lb 12.8 oz (65.7 kg)   SpO2 95%   BMI 20.78 kg/m     Wt Readings from Last 5 Encounters:  06/19/23 144 lb 12.8 oz (65.7 kg)  12/13/22 146 lb 8 oz (66.5 kg)  12/05/22 150 lb (68 kg)  09/18/22 150 lb (68 kg)  08/23/22 151 lb 6 oz (68.7 kg)    Constitutional: No acute distress Eyes: sclera non-icteric, normal conjunctiva and lids ENMT: normal dentition, moist mucous membranes Cardiovascular: regular rhythm, normal rate, 2/6 systolic murmur heard best at the right upper sternal  border, 2/6 holodiastolic murmur at the apex. S1 and S2 normal. No jugular venous distention.  Respiratory: clear to auscultation bilaterally GI : normal bowel sounds, soft and nontender. No distention.   MSK: extremities warm, well perfused. No edema.  NEURO: grossly nonfocal exam, moves all extremities. PSYCH: alert and oriented x 3, normal mood and affect.   ASSESSMENT:    1. Coronary artery disease involving native coronary artery of native heart without angina pectoris   2. Hyperlipidemia, unspecified hyperlipidemia type   3. PAC (premature atrial contraction)   4. PVC (premature ventricular contraction)   5. S/P CABG x 4   6. Mitral valve insufficiency, unspecified etiology   7. Aortic valve insufficiency, etiology of cardiac valve disease unspecified   8. Primary hypertension    PLAN:    CAD-no chest pain today.  Status post CABG x4 12/21.  -Minor bruising with aspirin otherwise tolerating well, continue aspirin and statin.   Hyperlipidemia-  -LDL 43 on atorvastatin 80 mg daily, continue current therapy   PAC,PVC-denies palpitations.  Previously on metoprolol stopped due to bradycardia and  possible hypotension. -None on EKG today, observe    Mitral valve insufficiency- Aortic valve insufficiency- -He does have an apical diastolic murmur which may represent eccentric aortic valve regurgitation otherwise may represent worsening of degenerative mitral valve disease.  I have recommended repeat echocardiogram which the patient defers due to discomfort with the echo probe at his apex.  We participated in shared decision making and determined that he will contact me when he begins to have any symptoms so that we can obtain an echocardiogram in a more urgent fashion which is his preference.  Essential hypertenson- -Likely an element of whitecoat hypertension today as his blood pressures are better controlled at home.  We discussed mildly permissive elevated blood pressure at home given his age, would ideally have his blood pressure around or below 140/80.  Will discuss at next follow-up if blood pressures have been rising on his home readings.  Otherwise continue current therapy with lisinopril 40 mg daily.    Total time of encounter: 30 minutes total time of encounter, including 20 minutes spent in face-to-face patient care on the date of this encounter. This time includes coordination of care and counseling regarding above mentioned problem list. Remainder of non-face-to-face time involved reviewing chart documents/testing relevant to the patient encounter and documentation in the medical record. I have independently reviewed documentation from referring provider.   Weston Brass, MD, [facility]   [facility]   Shared Decision Making/Informed Consent:       Medication Adjustments/Labs and Tests Ordered: Current medicines are reviewed at length with the patient today.  Concerns regarding medicines are outlined above.   Orders Placed This Encounter  Procedures   EKG 12-Lead    No orders of the defined types were placed in this encounter.   Patient Instructions   Medication Instructions:  Your physician recommends that you continue on your current medications as directed. Please refer to the Current Medication list given to you today.  *If you need a refill on your cardiac medications before your next appointment, please call your pharmacy*   Lab Work: NONE If you have labs (blood work) drawn today and your tests are completely normal, you will receive your results only by: MyChart Message (if you have MyChart) OR A paper copy in the mail If you have any lab test that is abnormal or we need to change your treatment, we will call you to  review the results.   Testing/Procedures: NONE   Follow-Up: At [facility], you and your health needs are our priority.  As part of our continuing mission to provide you with exceptional heart care, we have created designated Provider Care Teams.  These Care Teams include your primary Cardiologist (physician) and Advanced Practice Providers (APPs -  Physician Assistants and Nurse Practitioners) who all work together to provide you with the care you need, when you need it.  We recommend signing up for the patient portal called "MyChart".  Sign up information is provided on this After Visit Summary.  MyChart is used to connect with patients for Virtual Visits (Telemedicine).  Patients are able to view lab/test results, encounter notes, upcoming appointments, etc.  Non-urgent messages can be sent to your provider as well.   To learn more about what you can do with MyChart, go to [email or web address].    Your next appointment:   6 month(s)  Provider:   Parke Poisson, MD

## 2023-06-19 NOTE — Patient Instructions (Signed)
Medication Instructions:  Your physician recommends that you continue on your current medications as directed. Please refer to the Current Medication list given to you today.  *If you need a refill on your cardiac medications before your next appointment, please call your pharmacy*   Lab Work: NONE If you have labs (blood work) drawn today and your tests are completely normal, you will receive your results only by: MyChart Message (if you have MyChart) OR A paper copy in the mail If you have any lab test that is abnormal or we need to change your treatment, we will call you to review the results.   Testing/Procedures: NONE   Follow-Up: At [facility], you and your health needs are our priority.  As part of our continuing mission to provide you with exceptional heart care, we have created designated Provider Care Teams.  These Care Teams include your primary Cardiologist (physician) and Advanced Practice Providers (APPs -  Physician Assistants and Nurse Practitioners) who all work together to provide you with the care you need, when you need it.  We recommend signing up for the patient portal called "MyChart".  Sign up information is provided on this After Visit Summary.  MyChart is used to connect with patients for Virtual Visits (Telemedicine).  Patients are able to view lab/test results, encounter notes, upcoming appointments, etc.  Non-urgent messages can be sent to your provider as well.   To learn more about what you can do with MyChart, go to [email or web address].    Your next appointment:   6 month(s)  Provider:   Gayatri A Acharya, MD    

## 2023-08-24 ENCOUNTER — Other Ambulatory Visit: Payer: Self-pay | Admitting: Family Medicine

## 2023-08-24 ENCOUNTER — Other Ambulatory Visit: Payer: Self-pay | Admitting: Internal Medicine

## 2023-09-03 ENCOUNTER — Encounter: Payer: Self-pay | Admitting: Podiatry

## 2023-09-03 ENCOUNTER — Ambulatory Visit: Payer: Medicare Other | Admitting: Podiatry

## 2023-09-03 DIAGNOSIS — D2372 Other benign neoplasm of skin of left lower limb, including hip: Secondary | ICD-10-CM

## 2023-09-03 DIAGNOSIS — D2371 Other benign neoplasm of skin of right lower limb, including hip: Secondary | ICD-10-CM | POA: Diagnosis not present

## 2023-09-03 DIAGNOSIS — M79676 Pain in unspecified toe(s): Secondary | ICD-10-CM | POA: Diagnosis not present

## 2023-09-03 DIAGNOSIS — B351 Tinea unguium: Secondary | ICD-10-CM | POA: Diagnosis not present

## 2023-09-04 NOTE — Progress Notes (Signed)
He presents today chief complaint of painful elongated toenails and calluses.  Objective: Vital signs stable alert and oriented x 3.  Pulses are palpable.  Toenails are long thick yellow dystrophic clinically mycotic painful on palpation multiple benign skin lesions plantar aspect bilateral foot no open lesions or wounds.  Assessment: Pain limb secondary to onychomycosis.  Plan: Debridement of toenails 1 through 5 bilateral debridement of all benign skin lesions.

## 2023-10-07 ENCOUNTER — Other Ambulatory Visit: Payer: Self-pay

## 2023-10-07 DIAGNOSIS — H524 Presbyopia: Secondary | ICD-10-CM | POA: Diagnosis not present

## 2023-10-07 DIAGNOSIS — H52203 Unspecified astigmatism, bilateral: Secondary | ICD-10-CM | POA: Diagnosis not present

## 2023-10-07 DIAGNOSIS — Z961 Presence of intraocular lens: Secondary | ICD-10-CM | POA: Diagnosis not present

## 2023-10-07 MED ORDER — LISINOPRIL 40 MG PO TABS: 40.0000 mg | ORAL_TABLET | Freq: Every day | ORAL | 2 refills | Status: DC

## 2023-10-23 ENCOUNTER — Other Ambulatory Visit: Payer: Self-pay | Admitting: Internal Medicine

## 2023-11-08 NOTE — Telephone Encounter (Signed)
*  STAT* If patient is at the pharmacy, call can be transferred to refill team.   1. Which medications need to be refilled? (please list name of each medication and dose if known) Atorvastatin   2. Would you like to learn more about the convenience, safety, & potential cost savings by using the [facility]?     3. Are you open to using the [facility] (Type [facility].   4. Which pharmacy/location (including street and city if local pharmacy) is medication to be sent to? [facility]   5. Do they need a 30 day or 90 day supply? 90 days and refills

## 2023-11-21 ENCOUNTER — Ambulatory Visit: Payer: Medicare Other

## 2023-11-21 VITALS — Ht 68.5 in | Wt 150.0 lb

## 2023-11-21 DIAGNOSIS — Z0000 Encounter for general adult medical examination without abnormal findings: Secondary | ICD-10-CM | POA: Diagnosis not present

## 2023-11-21 DIAGNOSIS — H9193 Unspecified hearing loss, bilateral: Secondary | ICD-10-CM

## 2023-11-21 NOTE — Patient Instructions (Signed)
Eddie Fox , Thank you for taking time to come for your Medicare Wellness Visit. I appreciate your ongoing commitment to your health goals. Please review the following plan we discussed and let me know if I can assist you in the future.   Referrals/Orders/Follow-Ups/Clinician Recommendations:   Patient complains of difficulty with hearing. ENT referral placed. Patient is in agreement with treatment plan. Aware that the office will call with an appointment.    This is a list of the screening recommended for you and due dates:  Health Maintenance  Topic Date Due   Flu Shot  07/18/2023   COVID-19 Vaccine (4 - 2023-24 season) 08/18/2023   Medicare Annual Wellness Visit  11/20/2024   DTaP/Tdap/Td vaccine (4 - Td or Tdap) 01/02/2032   Pneumonia Vaccine  Completed   Zoster (Shingles) Vaccine  Completed   HPV Vaccine  Aged Out   Hepatitis C Screening  Discontinued    Advanced directives: (Declined) Advance directive discussed with you today. Even though you declined this today, please call our office should you change your mind, and we can give you the proper paperwork for you to fill out.  Next Medicare Annual Wellness Visit scheduled for next year: Yes 11/23/24 @ 1pm telephone

## 2023-11-21 NOTE — Progress Notes (Signed)
Subjective:   Eddie Fox is a 82 y.o. male who presents for Medicare Annual/Subsequent preventive examination.  Visit Complete: Virtual I connected with  Tomasa Blase on 11/21/23 by a audio enabled telemedicine application and verified that I am speaking with the correct person using two identifiers.  Patient Location: Home  Provider Location: Home Office  I discussed the limitations of evaluation and management by telemedicine. The patient expressed understanding and agreed to proceed.  Vital Signs: Because this visit was a virtual/telehealth visit, some criteria may be missing or patient reported. Any vitals not documented were not able to be obtained and vitals that have been documented are patient reported.  Patient Medicare AWV questionnaire was completed by the patient on (not done); I have confirmed that all information answered by patient is correct and no changes since this date.  Cardiac Risk Factors include: advanced age (>38men, >76 women);male gender;dyslipidemia;hypertension;sedentary lifestyle     Objective:    Today's Vitals   11/21/23 1305  Weight: 150 lb (68 kg)  Height: 5' 8.5" (1.74 m)   Body mass index is 22.48 kg/m.     11/21/2023    1:20 PM 12/05/2022   12:38 PM 12/04/2021   12:07 PM 11/21/2020   11:00 PM 11/18/2020    5:52 AM 06/23/2020   10:19 AM 06/15/2020   11:17 AM  Advanced Directives  Does Patient Have a Medical Advance Directive? No No Yes No No Yes Yes  Type of Advance Directive   Living will   Living will   Does patient want to make changes to medical advance directive?   Yes (MAU/Ambulatory/Procedural Areas - Information given)   No - Patient declined   Copy of Healthcare Power of Attorney in Chart?      No - copy requested No - copy requested  Would patient like information on creating a medical advance directive?  No - Patient declined  No - Patient declined No - Patient declined      Current Medications (verified) Outpatient  Encounter Medications as of 11/21/2023  Medication Sig   acetaminophen (TYLENOL) 500 MG tablet Take 1,000 mg by mouth every 6 (six) hours as needed for mild pain or headache.   aspirin 81 MG tablet Take 1 tablet (81 mg total) by mouth daily.   atorvastatin (LIPITOR) 80 MG tablet TAKE 1 TABLET BY MOUTH DAILY   Calcium-Phosphorus-Vitamin D (VITAMIN D3/CALCIUM/PHOSPHORUS) 120-100-78 UNIT-MG TABS Take 1 tablet by mouth.   fluticasone (FLONASE) 50 MCG/ACT nasal spray USE 2 SPRAYS IN BOTH  NOSTRILS ONCE DAILY AS  NEEDED   ipratropium (ATROVENT) 0.06 % nasal spray INSTILL 2 SPRAYS IN BOTH  NOSTRILS 4 TIMES DAILY AS NEEDED FOR RUNNY NOSE   lisinopril (ZESTRIL) 40 MG tablet Take 1 tablet (40 mg total) by mouth daily.   Multiple Vitamins-Minerals (MULTIVITAMIN MEN 50+ PO) Take 1 tablet by mouth daily.   pantoprazole (PROTONIX) 40 MG tablet TAKE 1 TABLET BY MOUTH DAILY   nitroGLYCERIN (NITROSTAT) 0.4 MG SL tablet Place 1 tablet (0.4 mg total) under the tongue every 5 (five) minutes as needed for chest pain.   No facility-administered encounter medications on file as of 11/21/2023.    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   Unspecified essential hypertension    Unspecified vitamin D deficiency    Past Surgical History:  Procedure Laterality Date   AMPUTATION     distal phalanx of right thumb- traumatic   CHOLECYSTECTOMY  10/03/09   lap   CORONARY ARTERY BYPASS GRAFT N/A 11/21/2020   Procedure: CORONARY ARTERY BYPASS GRAFTING (CABG) X 4, USING LEFT INTERNAL MAMMARY ARTERY AND RIGHT LEG GREATER  SAPHENOUS VEIN HARVESTED ENDOSCOPICALLY;  Surgeon: Corliss Skains, MD;  Location: [facility] OR;  Service: Open Heart Surgery;  Laterality: N/A;   LAMINECTOMY  1995   L 3-T11 Harrington rods in back   LAPAROSCOPIC APPENDECTOMY N/A 05/16/2016   Procedure: APPENDECTOMY LAPAROSCOPIC;  Surgeon: Gaynelle Adu, MD;  Location: [facility] OR;  Service: General;  Laterality: N/A;   LEFT HEART CATH AND CORONARY ANGIOGRAPHY N/A 11/18/2020   Procedure: LEFT HEART CATH AND CORONARY ANGIOGRAPHY;  Surgeon: Lennette Bihari, MD;  Location: [facility] INVASIVE CV LAB;  Service: Cardiovascular;  Laterality: N/A;   MELANOMA EXCISION  1995   right upper abd, Dr. Cathlyn Parsons HEAD EXCISION Right 06/23/2020   Procedure: RIGHT 5TH METATARSAL OSTEOTOMY WITH BUNIONETTE EXCISION, OPEN REDUCTION OF FIFTH METATARSAL PHALANGEAL JOINT, FIFTH METATARSAL PHALANGEAL CAPSULOTOMY, BURSECTOMY;  Surgeon: Terance Hart, MD;  Location: [facility];  Service: Orthopedics;  Laterality: Right;  LENGTH OF SURGERY: 1.5 HOURS   ORIF ANKLE FRACTURE  1967   TEE WITHOUT CARDIOVERSION N/A 11/21/2020   Procedure: TRANSESOPHAGEAL ECHOCARDIOGRAM (TEE);  Surgeon: Corliss Skains, MD;  Location: [facility] OR;  Service: Open Heart Surgery;  Laterality: N/A;   Family History  Problem Relation Age of Onset   Heart disease Mother        CHF   Hypertension Mother    Diabetes Mother    Stroke Mother    Hypertension Father    Heart disease Father        CHF   Hypertension Sister    Hypertension Sister    Depression Neg Hx    Alcohol abuse Neg Hx    Drug abuse Neg Hx    Social History   Socioeconomic History   Marital status: Widowed    Spouse name: Not on file   Number of children: 2   Years of education: Not on file   Highest education level: Not on file  Occupational History   Occupation: retired    Comment: retired from [employer] 04/2002  Tobacco Use   Smoking status: Never   Smokeless tobacco: Never  Vaping Use   Vaping  status: Never Used  Substance and Sexual Activity   Alcohol use: No   Drug use: No   Sexual activity: Yes    Partners: Female  Other Topics Concern   Not on file  Social History Narrative   Not on file   Social Determinants of Health   Financial Resource Strain: Low Risk  (11/21/2023)   Overall Financial Resource Strain (CARDIA)    Difficulty of Paying Living Expenses: Not hard at all  Food Insecurity: No Food Insecurity (11/21/2023)   Hunger Vital Sign    Worried About Running Out of Food in the Last Year: Never true    Ran Out of Food in the Last Year: Never true  Transportation Needs: No Transportation Needs (11/21/2023)   PRAPARE - Transportation  Lack of Transportation (Medical): No    Lack of Transportation (Non-Medical): No  Physical Activity: Insufficiently Active (11/21/2023)   Exercise Vital Sign    Days of Exercise per Week: 2 days    Minutes of Exercise per Session: 30 min  Stress: No Stress Concern Present (11/21/2023)   [employer] of Occupational Health - Occupational Stress Questionnaire    Feeling of Stress : Not at all  Social Connections: Moderately Isolated (11/21/2023)   Social Connection and Isolation Panel [NHANES]    Frequency of Communication with Friends and Family: More than three times a week    Frequency of Social Gatherings with Friends and Family: Three times a week    Attends Religious Services: More than 4 times per year    Active Member of Clubs or Organizations: No    Attends Banker Meetings: Never    Marital Status: Widowed    Tobacco Counseling Counseling given: Not Answered  Clinical Intake:  Pre-visit preparation completed: No  Pain : No/denies pain   BMI - recorded: 22.48 Nutritional Status: BMI of 19-24  Normal Nutritional Risks: None Diabetes: No  How often do you need to have someone help you when you read instructions, pamphlets, or other written materials from your doctor or pharmacy?: 1 -  Never  Interpreter Needed?: No  Comments: lives with alone Information entered by :: B.Darrly Loberg,LPN   Activities of Daily Living    11/21/2023    1:20 PM 12/05/2022   12:42 PM  In your present state of health, do you have any difficulty performing the following activities:  Hearing? 1 0  Vision? 1 0  Difficulty concentrating or making decisions? 0 0  Walking or climbing stairs? 0 0  Dressing or bathing? 0 0  Doing errands, shopping? 0 0  Preparing Food and eating ? N N  Using the Toilet? N N  In the past six months, have you accidently leaked urine? N N  Do you have problems with loss of bowel control? N N  Managing your Medications? N N  Managing your Finances? N N  Housekeeping or managing your Housekeeping? N N    Patient Care Team: Hannah Beat, MD as PCP - General Parke Poisson, MD as PCP - Cardiology (Cardiology) Arminda Resides, MD as Consulting Physician (Dermatology) Antony Contras, MD as Consulting Physician (Ophthalmology)  Indicate any recent Medical Services you may have received from other than [facility] providers in the past year (date may be approximate).     Assessment:   This is a routine wellness examination for Eddie Fox.  Hearing/Vision screen Hearing Screening - Comments:: Pt hearing not good: trying to get hearing aids Audiology referral Vision Screening - Comments:: Pt says his vision is alright;needs readers Dr Randon Goldsmith   Goals Addressed             This Visit's Progress    Patient Stated   On track    No goals right now     Patient Stated   On track    11/23/2019, I will maintain and continue medications as prescribed.        Depression Screen    11/21/2023    1:16 PM 12/05/2022   12:42 PM 12/04/2021   12:13 PM 04/20/2021   12:06 PM 11/23/2019    2:44 PM 11/20/2018   12:56 PM 11/20/2017   11:41 AM  PHQ 2/9 Scores  PHQ - 2 Score 0 0 0 0 0 0 0  PHQ- 9 Score  0 0 0     Fall Risk    11/21/2023    1:12 PM 12/05/2022   12:40 PM  12/04/2021   12:10 PM 04/20/2021   12:07 PM 11/23/2019    2:43 PM  Fall Risk   Falls in the past year? 0 0 1 0 1  Comment     tripped on vine  Number falls in past yr: 0 0 0  1  Injury with Fall? 0  0  0  Risk for fall due to : No Fall Risks  Other (Comment)  Medication side effect  Risk for fall due to: Comment   tripped over curb    Follow up Falls prevention discussed;Education provided Falls evaluation completed;Falls prevention discussed Falls prevention discussed  Falls evaluation completed;Falls prevention discussed    MEDICARE RISK AT HOME: Medicare Risk at Home Any stairs in or around the home?: No If so, are there any without handrails?: No Home free of loose throw rugs in walkways, pet beds, electrical cords, etc?: Yes Adequate lighting in your home to reduce risk of falls?: Yes Life alert?: No Use of a cane, walker or w/c?: No Grab bars in the bathroom?: No Shower chair or bench in shower?: No Elevated toilet seat or a handicapped toilet?: Yes  TIMED UP AND GO:  Was the test performed?  No    Cognitive Function:    11/23/2019    2:47 PM 11/20/2018   12:59 PM 11/07/2016    2:18 PM  MMSE - Mini Mental State Exam  Orientation to time 5 5 5   Orientation to Place 5 5 5   Registration 3 3 3   Attention/ Calculation 5 0 0  Recall 3 3 3   Language- name 2 objects  0 0  Language- repeat 1 1 1   Language- follow 3 step command  3 3  Language- read & follow direction  0 0  Write a sentence  0 0  Copy design  0 0  Total score  20 20        11/21/2023    1:21 PM  6CIT Screen  What Year? 0 points  What month? 0 points  What time? 0 points  Count back from 20 0 points  Months in reverse 4 points  Repeat phrase 0 points  Total Score 4 points    Immunizations Immunization History  Administered Date(s) Administered   Influenza Split 12/25/2011   Influenza, High Dose Seasonal PF 08/31/2020, 09/06/2021, 03/13/2022   Influenza,inj,Quad PF,6+ Mos 08/19/2013,  08/26/2014, 09/14/2015, 09/11/2016, 08/28/2017, 08/27/2018, 08/10/2019   PFIZER(Purple Top)SARS-COV-2 Vaccination 01/06/2020, 01/27/2020   Pfizer Covid-19 Vaccine Bivalent Booster 70yrs & up 09/07/2021   Pneumococcal Conjugate-13 02/16/2015   Pneumococcal Polysaccharide-23 06/28/2022   Td 03/24/2002, 12/22/2009   Tdap 01/01/2022   Zoster Recombinant(Shingrix) 01/03/2022, 03/13/2022    TDAP status: Up to date  Flu Vaccine status: Up to date [facility]  Pneumococcal vaccine status: Up to date  [facility]  Qualifies for Shingles Vaccine? Yes   Zostavax completed Yes   Shingrix Completed?: Yes  Screening Tests Health Maintenance  Topic Date Due   INFLUENZA VACCINE  07/18/2023   COVID-19 Vaccine (4 - 2023-24 season) 08/18/2023   Medicare Annual Wellness (AWV)  11/20/2024   DTaP/Tdap/Td (4 - Td or Tdap) 01/02/2032   Pneumonia Vaccine 47+ Years old  Completed   Zoster Vaccines- Shingrix  Completed   HPV VACCINES  Aged Out   Hepatitis C Screening  Discontinued    Health  Maintenance  Health Maintenance Due  Topic Date Due   INFLUENZA VACCINE  07/18/2023   COVID-19 Vaccine (4 - 2023-24 season) 08/18/2023    Colorectal cancer screening: No longer required.   Lung Cancer Screening: (Low Dose CT Chest recommended if Age 56-80 years, 20 pack-year currently smoking OR have quit w/in 15years.) does not qualify.   Lung Cancer Screening Referral: no  Additional Screening:  Hepatitis C Screening: does not qualify; Completed 12/20/21  Vision Screening: Recommended annual ophthalmology exams for early detection of glaucoma and other disorders of the eye. Is the patient up to date with their annual eye exam?  Yes  Who is the provider or what is the name of the office in which the patient attends annual eye exams? Dr Randon Goldsmith If pt is not established with a provider, would they like to be referred to a provider to establish care? No .   Dental  Screening: Recommended annual dental exams for proper oral hygiene  Diabetic Foot Exam:   Community Resource Referral / Chronic Care Management: CRR required this visit?  No   CCM required this visit?  No   Plan:     I have personally reviewed and noted the following in the patient's chart:   Medical and social history Use of alcohol, tobacco or illicit drugs  Current medications and supplements including opioid prescriptions. Patient is not currently taking opioid prescriptions. Functional ability and status Nutritional status Physical activity Advanced directives List of other physicians Hospitalizations, surgeries, and ER visits in previous 12 months Vitals Screenings to include cognitive, depression, and falls Referrals and appointments  In addition, I have reviewed and discussed with patient certain preventive protocols, quality metrics, and best practice recommendations. A written personalized care plan for preventive services as well as general preventive health recommendations were provided to patient.    Sue Lush, LPN   16/12/958   After Visit Summary: (Declined) Due to this being a telephonic visit, with patients personalized plan was offered to patient but patient Declined AVS at this time   Nurse Notes: Pt having difficulty hearing as he relays he does not hear well. He desires hearing test. He does relays he wants an injection in his rt shoulder again. He is having a flare again: intermittent joint pain. He has no other questions or concerns.

## 2023-11-22 ENCOUNTER — Telehealth: Payer: Self-pay | Admitting: *Deleted

## 2023-11-22 DIAGNOSIS — E7800 Pure hypercholesterolemia, unspecified: Secondary | ICD-10-CM

## 2023-11-22 DIAGNOSIS — C181 Malignant neoplasm of appendix: Secondary | ICD-10-CM

## 2023-11-22 DIAGNOSIS — Z79899 Other long term (current) drug therapy: Secondary | ICD-10-CM

## 2023-11-22 NOTE — Telephone Encounter (Signed)
-----   Message from Vincenza Hews sent at 11/22/2023  1:52 PM EST ----- Regarding: Lab Mon 12/09/23 Hello,  Patient is coming in for CPE labs on Monday 12/09/23. Can we get orders please.   Thanks

## 2023-12-02 NOTE — Progress Notes (Signed)
Subjective:   CULVER DRAIN is a 82 y.o. male who presents for Medicare Annual/Subsequent preventive examination.  Visit Complete: Virtual I connected with  Tomasa Blase on 12/02/23 by a audio enabled telemedicine application and verified that I am speaking with the correct person using two identifiers.  Patient Location: Home  Provider Location: Home Office  I discussed the limitations of evaluation and management by telemedicine. The patient expressed understanding and agreed to proceed.  Vital Signs: Because this visit was a virtual/telehealth visit, some criteria may be missing or patient reported. Any vitals not documented were not able to be obtained and vitals that have been documented are patient reported.  Patient Medicare AWV questionnaire was completed by the patient on (not done); I have confirmed that all information answered by patient is correct and no changes since this date.  Cardiac Risk Factors include: advanced age (>50men, >4 women);male gender;dyslipidemia;hypertension;sedentary lifestyle     Objective:    Today's Vitals   11/21/23 1305  Weight: 150 lb (68 kg)  Height: 5' 8.5" (1.74 m)   Body mass index is 22.48 kg/m.     11/21/2023    1:20 PM 12/05/2022   12:38 PM 12/04/2021   12:07 PM 11/21/2020   11:00 PM 11/18/2020    5:52 AM 06/23/2020   10:19 AM 06/15/2020   11:17 AM  Advanced Directives  Does Patient Have a Medical Advance Directive? No No Yes No No Yes Yes  Type of Advance Directive   Living will   Living will   Does patient want to make changes to medical advance directive?   Yes (MAU/Ambulatory/Procedural Areas - Information given)   No - Patient declined   Copy of Healthcare Power of Attorney in Chart?      No - copy requested No - copy requested  Would patient like information on creating a medical advance directive?  No - Patient declined  No - Patient declined No - Patient declined      Current Medications (verified) Outpatient  Encounter Medications as of 11/21/2023  Medication Sig   acetaminophen (TYLENOL) 500 MG tablet Take 1,000 mg by mouth every 6 (six) hours as needed for mild pain or headache.   aspirin 81 MG tablet Take 1 tablet (81 mg total) by mouth daily.   atorvastatin (LIPITOR) 80 MG tablet TAKE 1 TABLET BY MOUTH DAILY   Calcium-Phosphorus-Vitamin D (VITAMIN D3/CALCIUM/PHOSPHORUS) 120-100-78 UNIT-MG TABS Take 1 tablet by mouth.   fluticasone (FLONASE) 50 MCG/ACT nasal spray USE 2 SPRAYS IN BOTH  NOSTRILS ONCE DAILY AS  NEEDED   ipratropium (ATROVENT) 0.06 % nasal spray INSTILL 2 SPRAYS IN BOTH  NOSTRILS 4 TIMES DAILY AS NEEDED FOR RUNNY NOSE   lisinopril (ZESTRIL) 40 MG tablet Take 1 tablet (40 mg total) by mouth daily.   Multiple Vitamins-Minerals (MULTIVITAMIN MEN 50+ PO) Take 1 tablet by mouth daily.   pantoprazole (PROTONIX) 40 MG tablet TAKE 1 TABLET BY MOUTH DAILY   nitroGLYCERIN (NITROSTAT) 0.4 MG SL tablet Place 1 tablet (0.4 mg total) under the tongue every 5 (five) minutes as needed for chest pain.   No facility-administered encounter medications on file as of 11/21/2023.    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   Unspecified essential hypertension    Unspecified vitamin D deficiency    Past Surgical History:  Procedure Laterality Date   AMPUTATION     distal phalanx of right thumb- traumatic   CHOLECYSTECTOMY  10/03/09   lap   CORONARY ARTERY BYPASS GRAFT N/A 11/21/2020   Procedure: CORONARY ARTERY BYPASS GRAFTING (CABG) X 4, USING LEFT INTERNAL MAMMARY ARTERY AND RIGHT LEG GREATER  SAPHENOUS VEIN HARVESTED ENDOSCOPICALLY;  Surgeon: Corliss Skains, MD;  Location: [facility] OR;  Service: Open Heart Surgery;  Laterality: N/A;   LAMINECTOMY  1995   L 3-T11 Harrington rods in back   LAPAROSCOPIC APPENDECTOMY N/A 05/16/2016   Procedure: APPENDECTOMY LAPAROSCOPIC;  Surgeon: Gaynelle Adu, MD;  Location: [facility] OR;  Service: General;  Laterality: N/A;   LEFT HEART CATH AND CORONARY ANGIOGRAPHY N/A 11/18/2020   Procedure: LEFT HEART CATH AND CORONARY ANGIOGRAPHY;  Surgeon: Lennette Bihari, MD;  Location: [facility] INVASIVE CV LAB;  Service: Cardiovascular;  Laterality: N/A;   MELANOMA EXCISION  1995   right upper abd, Dr. Cathlyn Parsons HEAD EXCISION Right 06/23/2020   Procedure: RIGHT 5TH METATARSAL OSTEOTOMY WITH BUNIONETTE EXCISION, OPEN REDUCTION OF FIFTH METATARSAL PHALANGEAL JOINT, FIFTH METATARSAL PHALANGEAL CAPSULOTOMY, BURSECTOMY;  Surgeon: Terance Hart, MD;  Location: [facility];  Service: Orthopedics;  Laterality: Right;  LENGTH OF SURGERY: 1.5 HOURS   ORIF ANKLE FRACTURE  1967   TEE WITHOUT CARDIOVERSION N/A 11/21/2020   Procedure: TRANSESOPHAGEAL ECHOCARDIOGRAM (TEE);  Surgeon: Corliss Skains, MD;  Location: [facility] OR;  Service: Open Heart Surgery;  Laterality: N/A;   Family History  Problem Relation Age of Onset   Heart disease Mother        CHF   Hypertension Mother    Diabetes Mother    Stroke Mother    Hypertension Father    Heart disease Father        CHF   Hypertension Sister    Hypertension Sister    Depression Neg Hx    Alcohol abuse Neg Hx    Drug abuse Neg Hx    Social History   Socioeconomic History   Marital status: Widowed    Spouse name: Not on file   Number of children: 2   Years of education: Not on file   Highest education level: Not on file  Occupational History   Occupation: retired    Comment: retired from [employer] 04/2002  Tobacco Use   Smoking status: Never   Smokeless tobacco: Never  Vaping Use   Vaping  status: Never Used  Substance and Sexual Activity   Alcohol use: No   Drug use: No   Sexual activity: Yes    Partners: Female  Other Topics Concern   Not on file  Social History Narrative   Not on file   Social Drivers of Health   Financial Resource Strain: Low Risk  (11/21/2023)   Overall Financial Resource Strain (CARDIA)    Difficulty of Paying Living Expenses: Not hard at all  Food Insecurity: No Food Insecurity (11/21/2023)   Hunger Vital Sign    Worried About Running Out of Food in the Last Year: Never true    Ran Out of Food in the Last Year: Never true  Transportation Needs: No Transportation Needs (11/21/2023)   PRAPARE - Transportation  Lack of Transportation (Medical): No    Lack of Transportation (Non-Medical): No  Physical Activity: Insufficiently Active (11/21/2023)   Exercise Vital Sign    Days of Exercise per Week: 2 days    Minutes of Exercise per Session: 30 min  Stress: No Stress Concern Present (11/21/2023)   [employer] of Occupational Health - Occupational Stress Questionnaire    Feeling of Stress : Not at all  Social Connections: Moderately Isolated (11/21/2023)   Social Connection and Isolation Panel [NHANES]    Frequency of Communication with Friends and Family: More than three times a week    Frequency of Social Gatherings with Friends and Family: Three times a week    Attends Religious Services: More than 4 times per year    Active Member of Clubs or Organizations: No    Attends Banker Meetings: Never    Marital Status: Widowed    Tobacco Counseling Counseling given: Not Answered  Clinical Intake:  Pre-visit preparation completed: No  Pain : No/denies pain   BMI - recorded: 22.48 Nutritional Status: BMI of 19-24  Normal Nutritional Risks: None Diabetes: No  How often do you need to have someone help you when you read instructions, pamphlets, or other written materials from your doctor or pharmacy?: 1 -  Never  Interpreter Needed?: No  Comments: lives with alone Information entered by :: B.Bordeaux,LPN   Activities of Daily Living    11/21/2023    1:20 PM 12/05/2022   12:42 PM  In your present state of health, do you have any difficulty performing the following activities:  Hearing? 1 0  Vision? 1 0  Difficulty concentrating or making decisions? 0 0  Walking or climbing stairs? 0 0  Dressing or bathing? 0 0  Doing errands, shopping? 0 0  Preparing Food and eating ? N N  Using the Toilet? N N  In the past six months, have you accidently leaked urine? N N  Do you have problems with loss of bowel control? N N  Managing your Medications? N N  Managing your Finances? N N  Housekeeping or managing your Housekeeping? N N    Patient Care Team: Hannah Beat, MD as PCP - General Parke Poisson, MD as PCP - Cardiology (Cardiology) Arminda Resides, MD as Consulting Physician (Dermatology) Antony Contras, MD as Consulting Physician (Ophthalmology)  Indicate any recent Medical Services you may have received from other than [facility] providers in the past year (date may be approximate).     Assessment:   This is a routine wellness examination for Alondra.  Hearing/Vision screen Hearing Screening - Comments:: Pt hearing not good: trying to get hearing aids Audiology referral Vision Screening - Comments:: Pt says his vision is alright;needs readers Dr Randon Goldsmith   Goals Addressed             This Visit's Progress    Patient Stated   On track    No goals right now     Patient Stated   On track    11/23/2019, I will maintain and continue medications as prescribed.        Depression Screen    11/21/2023    1:16 PM 12/05/2022   12:42 PM 12/04/2021   12:13 PM 04/20/2021   12:06 PM 11/23/2019    2:44 PM 11/20/2018   12:56 PM 11/20/2017   11:41 AM  PHQ 2/9 Scores  PHQ - 2 Score 0 0 0 0 0 0 0  PHQ- 9 Score  0 0 0     Fall Risk    11/21/2023    1:12 PM 12/05/2022   12:40 PM  12/04/2021   12:10 PM 04/20/2021   12:07 PM 11/23/2019    2:43 PM  Fall Risk   Falls in the past year? 0 0 1 0 1  Comment     tripped on vine  Number falls in past yr: 0 0 0  1  Injury with Fall? 0  0  0  Risk for fall due to : No Fall Risks  Other (Comment)  Medication side effect  Risk for fall due to: Comment   tripped over curb    Follow up Falls prevention discussed;Education provided Falls evaluation completed;Falls prevention discussed Falls prevention discussed  Falls evaluation completed;Falls prevention discussed    MEDICARE RISK AT HOME: Medicare Risk at Home Any stairs in or around the home?: No If so, are there any without handrails?: No Home free of loose throw rugs in walkways, pet beds, electrical cords, etc?: Yes Adequate lighting in your home to reduce risk of falls?: Yes Life alert?: No Use of a cane, walker or w/c?: No Grab bars in the bathroom?: No Shower chair or bench in shower?: No Elevated toilet seat or a handicapped toilet?: Yes  TIMED UP AND GO:  Was the test performed?  No    Cognitive Function:    11/23/2019    2:47 PM 11/20/2018   12:59 PM 11/07/2016    2:18 PM  MMSE - Mini Mental State Exam  Orientation to time 5 5 5   Orientation to Place 5 5 5   Registration 3 3 3   Attention/ Calculation 5 0 0  Recall 3 3 3   Language- name 2 objects  0 0  Language- repeat 1 1 1   Language- follow 3 step command  3 3  Language- read & follow direction  0 0  Write a sentence  0 0  Copy design  0 0  Total score  20 20        11/21/2023    1:21 PM  6CIT Screen  What Year? 0 points  What month? 0 points  What time? 0 points  Count back from 20 0 points  Months in reverse 4 points  Repeat phrase 0 points  Total Score 4 points    Immunizations Immunization History  Administered Date(s) Administered   Fluad Quad(high Dose 65+) 10/23/2023   Influenza Split 12/25/2011   Influenza, High Dose Seasonal PF 08/31/2020, 09/06/2021, 03/13/2022    Influenza,inj,Quad PF,6+ Mos 08/19/2013, 08/26/2014, 09/14/2015, 09/11/2016, 08/28/2017, 08/27/2018, 08/10/2019   PFIZER(Purple Top)SARS-COV-2 Vaccination 01/06/2020, 01/27/2020   Pfizer Covid-19 Vaccine Bivalent Booster 71yrs & up 09/07/2021, 10/23/2023   Pneumococcal Conjugate-13 02/16/2015   Pneumococcal Polysaccharide-23 06/28/2022   Td 03/24/2002, 12/22/2009   Tdap 01/01/2022   Zoster Recombinant(Shingrix) 01/03/2022, 03/13/2022    TDAP status: Up to date  Flu Vaccine status: Up to date [facility]  Pneumococcal vaccine status: Up to date  [facility]  Qualifies for Shingles Vaccine? Yes   Zostavax completed Yes   Shingrix Completed?: Yes  Screening Tests Health Maintenance  Topic Date Due   COVID-19 Vaccine (5 - 2024-25 season) 12/18/2023   Medicare Annual Wellness (AWV)  11/20/2024   DTaP/Tdap/Td (4 - Td or Tdap) 01/02/2032   Pneumonia Vaccine 64+ Years old  Completed   INFLUENZA VACCINE  Completed   Zoster Vaccines- Shingrix  Completed   HPV VACCINES  Aged Out   Hepatitis  C Screening  Discontinued    Health Maintenance  There are no preventive care reminders to display for this patient.   Colorectal cancer screening: No longer required.   Lung Cancer Screening: (Low Dose CT Chest recommended if Age 75-80 years, 20 pack-year currently smoking OR have quit w/in 15years.) does not qualify.   Lung Cancer Screening Referral: no  Additional Screening:  Hepatitis C Screening: does not qualify; Completed 12/20/21  Vision Screening: Recommended annual ophthalmology exams for early detection of glaucoma and other disorders of the eye. Is the patient up to date with their annual eye exam?  Yes  Who is the provider or what is the name of the office in which the patient attends annual eye exams? Dr Randon Goldsmith If pt is not established with a provider, would they like to be referred to a provider to establish care? No .   Dental  Screening: Recommended annual dental exams for proper oral hygiene  Diabetic Foot Exam:   Community Resource Referral / Chronic Care Management: CRR required this visit?  No   CCM required this visit?  No   Plan:     I have personally reviewed and noted the following in the patient's chart:   Medical and social history Use of alcohol, tobacco or illicit drugs  Current medications and supplements including opioid prescriptions. Patient is not currently taking opioid prescriptions. Functional ability and status Nutritional status Physical activity Advanced directives List of other physicians Hospitalizations, surgeries, and ER visits in previous 12 months Vitals Screenings to include cognitive, depression, and falls Referrals and appointments  In addition, I have reviewed and discussed with patient certain preventive protocols, quality metrics, and best practice recommendations. A written personalized care plan for preventive services as well as general preventive health recommendations were provided to patient.    After Visit Summary: (Declined) Due to this being a telephonic visit, with patients personalized plan was offered to patient but patient Declined AVS at this time   Nurse Notes: Pt having difficulty hearing as he relays he does not hear well. He desires hearing test. He does relays he wants an injection in his rt shoulder again. He is having a flare again: intermittent joint pain. He has no other questions or concerns.   Attestation of Medicare Wellness Exam. I reviewed health advisor's note, was available for consultation, and agree with documentation and plan.  Signed,  Elpidio Galea. Bayne Fosnaugh, MD

## 2023-12-03 ENCOUNTER — Encounter: Payer: Self-pay | Admitting: Podiatry

## 2023-12-03 ENCOUNTER — Ambulatory Visit: Payer: Medicare Other | Admitting: Podiatry

## 2023-12-03 DIAGNOSIS — B351 Tinea unguium: Secondary | ICD-10-CM | POA: Diagnosis not present

## 2023-12-03 DIAGNOSIS — M79676 Pain in unspecified toe(s): Secondary | ICD-10-CM | POA: Diagnosis not present

## 2023-12-03 DIAGNOSIS — D2371 Other benign neoplasm of skin of right lower limb, including hip: Secondary | ICD-10-CM | POA: Diagnosis not present

## 2023-12-03 DIAGNOSIS — D2372 Other benign neoplasm of skin of left lower limb, including hip: Secondary | ICD-10-CM | POA: Diagnosis not present

## 2023-12-03 NOTE — Progress Notes (Signed)
Presents today chief complaint of painful calluses and toenails bilateral.  Objective: Toenails are long thick yellow dystrophic clinical mycotic painful benign skin lesions bilateral.  Assessment: Pain limb secondary to painful benign skin lesions and pain in limb secondary to onychomycosis.  Plan: Debrided benign skin lesion debrided painful toenails bilateral.

## 2023-12-08 NOTE — Progress Notes (Unsigned)
    Gyan Cambre T. Azrael Maddix, MD, CAQ Sports Medicine [facility] [address] [place], [address]  Phone: [phone]  FAX: [phone]  Eddie Fox - 82 y.o. male  MRN [id number]  Date of Birth: 04/10/1941  Date: 12/09/2023  PCP: Hannah Beat, MD  Referral: Hannah Beat, MD  No chief complaint on file.  Subjective:   Eddie Fox is a 82 y.o. very pleasant male patient with There is no height or weight on file to calculate BMI. who presents with the following:  The patient presents with ongoing right-sided shoulder pain.  He is also here for follow-up of lab work.    Review of Systems is noted in the HPI, as appropriate  Objective:   There were no vitals taken for this visit.  GEN: No acute distress; alert,appropriate. PULM: Breathing comfortably in no respiratory distress PSYCH: Normally interactive.   Laboratory and Imaging Data:  Assessment and Plan:   ***

## 2023-12-09 ENCOUNTER — Other Ambulatory Visit: Payer: Medicare Other

## 2023-12-09 ENCOUNTER — Ambulatory Visit (INDEPENDENT_AMBULATORY_CARE_PROVIDER_SITE_OTHER): Payer: Medicare Other | Admitting: Family Medicine

## 2023-12-09 ENCOUNTER — Encounter: Payer: Self-pay | Admitting: Family Medicine

## 2023-12-09 VITALS — BP 160/66 | HR 63 | Temp 98.2°F | Ht 67.0 in | Wt 151.4 lb

## 2023-12-09 DIAGNOSIS — E7800 Pure hypercholesterolemia, unspecified: Secondary | ICD-10-CM

## 2023-12-09 DIAGNOSIS — C181 Malignant neoplasm of appendix: Secondary | ICD-10-CM | POA: Diagnosis not present

## 2023-12-09 DIAGNOSIS — Z79899 Other long term (current) drug therapy: Secondary | ICD-10-CM | POA: Diagnosis not present

## 2023-12-09 DIAGNOSIS — M25511 Pain in right shoulder: Secondary | ICD-10-CM | POA: Diagnosis not present

## 2023-12-09 DIAGNOSIS — G8929 Other chronic pain: Secondary | ICD-10-CM | POA: Diagnosis not present

## 2023-12-09 DIAGNOSIS — E559 Vitamin D deficiency, unspecified: Secondary | ICD-10-CM

## 2023-12-09 DIAGNOSIS — Z131 Encounter for screening for diabetes mellitus: Secondary | ICD-10-CM

## 2023-12-09 LAB — COMPREHENSIVE METABOLIC PANEL
ALT: 14 U/L (ref 0–53)
AST: 20 U/L (ref 0–37)
Albumin: 4.3 g/dL (ref 3.5–5.2)
Alkaline Phosphatase: 52 U/L (ref 39–117)
BUN: 23 mg/dL (ref 6–23)
CO2: 28 meq/L (ref 19–32)
Calcium: 9.1 mg/dL (ref 8.4–10.5)
Chloride: 107 meq/L (ref 96–112)
Creatinine, Ser: 1.15 mg/dL (ref 0.40–1.50)
GFR: 59.64 mL/min — ABNORMAL LOW (ref >60.00)
Glucose, Bld: 81 mg/dL (ref 70–99)
Potassium: 4.6 meq/L (ref 3.5–5.1)
Sodium: 142 meq/L (ref 135–145)
Total Bilirubin: 0.5 mg/dL (ref 0.2–1.2)
Total Protein: 6.8 g/dL (ref 6.0–8.3)

## 2023-12-09 LAB — HEMOGLOBIN A1C: Hgb A1c MFr Bld: 5.1 % (ref 4.6–6.5)

## 2023-12-09 LAB — CBC WITH DIFFERENTIAL/PLATELET
Basophils Absolute: 0 10*3/uL (ref 0.0–0.1)
Basophils Relative: 0.5 % (ref 0.0–3.0)
Eosinophils Absolute: 0.3 10*3/uL (ref 0.0–0.7)
Eosinophils Relative: 3.4 % (ref 0.0–5.0)
HCT: 45.4 % (ref 39.0–52.0)
Hemoglobin: 15.2 g/dL (ref 13.0–17.0)
Lymphocytes Relative: 31.8 % (ref 12.0–46.0)
Lymphs Abs: 2.5 10*3/uL (ref 0.7–4.0)
MCHC: 33.5 g/dL (ref 30.0–36.0)
MCV: 99 fL (ref 78.0–100.0)
Monocytes Absolute: 0.5 10*3/uL (ref 0.1–1.0)
Monocytes Relative: 5.9 % (ref 3.0–12.0)
Neutro Abs: 4.7 10*3/uL (ref 1.4–7.7)
Neutrophils Relative %: 58.4 % (ref 43.0–77.0)
Platelets: 163 10*3/uL (ref 150.0–400.0)
RBC: 4.58 Mil/uL (ref 4.22–5.81)
RDW: 13.6 % (ref 11.5–15.5)
WBC: 8 10*3/uL (ref 4.0–10.5)

## 2023-12-09 LAB — LIPID PANEL
Cholesterol: 104 mg/dL (ref 0–200)
HDL: 40.2 mg/dL (ref >39.00)
LDL Cholesterol: 47 mg/dL (ref 0–99)
NonHDL: 64.2
Total CHOL/HDL Ratio: 3
Triglycerides: 86 mg/dL (ref 0.0–149.0)
VLDL: 17.2 mg/dL (ref 0.0–40.0)

## 2023-12-09 LAB — VITAMIN D 25 HYDROXY (VIT D DEFICIENCY, FRACTURES): VITD: 66.69 ng/mL (ref 30.00–100.00)

## 2023-12-09 MED ORDER — TRIAMCINOLONE ACETONIDE 40 MG/ML IJ SUSP
40.0000 mg | Freq: Once | INTRAMUSCULAR | Status: AC
  Administered 2023-12-09: 40 mg via INTRA_ARTICULAR

## 2023-12-10 LAB — CEA: CEA: <2 ng/mL

## 2023-12-15 NOTE — Progress Notes (Unsigned)
Eddie Manetta T. Anistyn Graddy, MD, CAQ [facility] [address] [place], [address]  Phone: [phone]  FAX: [phone]  Eddie Fox - 82 y.o. male  MRN [id number]  Date of Birth: September 02, 1941  Date: 12/16/2023  PCP: Hannah Beat, MD  Referral: Hannah Beat, MD  Chief Complaint  Patient presents with   Annual Exam    Part 2   Cough    X 1 week   Patient Care Team: Hannah Beat, MD as PCP - General Parke Poisson, MD as PCP - Cardiology (Cardiology) Arminda Resides, MD as Consulting Physician (Dermatology) Antony Contras, MD as Consulting Physician (Ophthalmology) Subjective:   Eddie Fox is a 82 y.o. pleasant patient who presents with the following:  Preventative Health Maintenance Visit:  Health Maintenance Summary Reviewed and updated, unless pt declines services.  Tobacco History Reviewed. Alcohol: No concerns, no excessive use Exercise Habits: Some activity, rec at least 30 mins 5 times a week STD concerns: no risk or activity to increase risk Drug Use: None  RSV  BP Readings from Last 3 Encounters:  12/16/23 (!) 160/80  12/09/23 (!) 160/66  06/19/23 (!) 158/72    Systolic 180 after corticosteroid injection 140/60 at home He has generally been running in the 140s to 60 - 70s diastolic  Saw the patient last week for some chronic right shoulder pain and did do an intra-articular injection at that time.   Acute Covid-19. He also has had cough, congestion, sore throat, earache, intermittently productive cough.  He also has a lot of nasal congestion.   Health Maintenance  Topic Date Due   COVID-19 Vaccine (5 - 2024-25 season) 12/18/2023   Medicare Annual Wellness (AWV)  11/20/2024   DTaP/Tdap/Td (4 - Td or Tdap) 01/02/2032   Pneumonia Vaccine 62+ Years old  Completed   INFLUENZA VACCINE  Completed   Zoster Vaccines- Shingrix  Completed   HPV VACCINES  Aged Out   Hepatitis C  Screening  Discontinued   Immunization History  Administered Date(s) Administered   Fluad Quad(high Dose 65+) 10/23/2023   Influenza Split 12/25/2011   Influenza, High Dose Seasonal PF 08/31/2020, 09/06/2021, 03/13/2022   Influenza,inj,Quad PF,6+ Mos 08/19/2013, 08/26/2014, 09/14/2015, 09/11/2016, 08/28/2017, 08/27/2018, 08/10/2019   PFIZER(Purple Top)SARS-COV-2 Vaccination 01/06/2020, 01/27/2020   Pfizer Covid-19 Vaccine Bivalent Booster 69yrs & up 09/07/2021, 10/23/2023   Pneumococcal Conjugate-13 02/16/2015   Pneumococcal Polysaccharide-23 06/28/2022   Td 03/24/2002, 12/22/2009   Tdap 01/01/2022   Zoster Recombinant(Shingrix) 01/03/2022, 03/13/2022   Patient Active Problem List   Diagnosis Date Noted   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    Priority: High   CAD (coronary artery disease), native coronary artery 11/18/2020    Priority: High   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    Priority: High   Melanoma of abdominal wall (HCC) 01/12/2014    Priority: Medium    Essential hypertension 09/14/2008    Priority: Medium    Hyperlipidemia 07/08/2007    Priority: Medium    Atrial bigeminy 09/26/2020   Vitamin D deficiency 01/30/2010   FATTY LIVER DISEASE 08/12/2009   ERECTILE DYSFUNCTION 07/08/2007   PEYRONIE'S DISEASE, 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   Benign neoplasm of colon    CAD (coronary artery disease), native coronary artery 11/18/2020   ED (erectile dysfunction)    IBS (irritable bowel syndrome)    Melanoma (HCC)    history of  Other and unspecified hyperlipidemia    Other chronic nonalcoholic liver disease    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   Unspecified essential hypertension    Unspecified vitamin D deficiency     Past Surgical History:  Procedure Laterality Date   AMPUTATION     distal phalanx of right thumb- traumatic   CHOLECYSTECTOMY  10/03/09   lap   CORONARY ARTERY BYPASS GRAFT N/A 11/21/2020    Procedure: CORONARY ARTERY BYPASS GRAFTING (CABG) X 4, USING LEFT INTERNAL MAMMARY ARTERY AND RIGHT LEG GREATER SAPHENOUS VEIN HARVESTED ENDOSCOPICALLY;  Surgeon: Corliss Skains, MD;  Location: [facility] OR;  Service: Open Heart Surgery;  Laterality: N/A;   LAMINECTOMY  1995   L 3-T11 Harrington rods in back   LAPAROSCOPIC APPENDECTOMY N/A 05/16/2016   Procedure: APPENDECTOMY LAPAROSCOPIC;  Surgeon: Gaynelle Adu, MD;  Location[facility] OR;  Service: General;  Laterality: N/A;   LEFT HEART CATH AND CORONARY ANGIOGRAPHY N/A 11/18/2020   Procedure: LEFT HEART CATH AND CORONARY ANGIOGRAPHY;  Surgeon: Lennette Bihari, MD;  Location: [facility] INVASIVE CV LAB;  Service: Cardiovascular;  Laterality: N/A;   MELANOMA EXCISION  1995   right upper abd, Dr. Cathlyn Parsons HEAD EXCISION Right 06/23/2020   Procedure: RIGHT 5TH METATARSAL OSTEOTOMY WITH BUNIONETTE EXCISION, OPEN REDUCTION OF FIFTH METATARSAL PHALANGEAL JOINT, FIFTH METATARSAL PHALANGEAL CAPSULOTOMY, BURSECTOMY;  Surgeon: Terance Hart, MD;  Location: [facility];  Service: Orthopedics;  Laterality: Right;  LENGTH OF SURGERY: 1.5 HOURS   ORIF ANKLE FRACTURE  1967   TEE WITHOUT CARDIOVERSION N/A 11/21/2020   Procedure: TRANSESOPHAGEAL ECHOCARDIOGRAM (TEE);  Surgeon: Corliss Skains, MD;  Location: [facility] OR;  Service: Open Heart Surgery;  Laterality: N/A;    Family History  Problem Relation Age of Onset   Heart disease Mother        CHF   Hypertension Mother    Diabetes Mother    Stroke Mother    Hypertension Father    Heart disease Father        CHF   Hypertension Sister    Hypertension Sister    Depression Neg Hx    Alcohol abuse Neg Hx    Drug abuse Neg Hx     Social History   Social History Narrative   Not on file    Past Medical History, Surgical History, Social History, Family History, Problem List, Medications, and Allergies have been reviewed and updated if relevant.  Review of Systems: Pertinent positives are  listed above.  Otherwise, a full 14 point review of systems has been done in full and it is negative except where it is noted positive.  Objective:   BP (!) 160/80 (BP Location: Right Arm, Patient Position: Sitting, Cuff Size: Normal)   Pulse 97   Temp 99.5 F (37.5 C) (Temporal)   Ht 5\' 7"  (1.702 m)   Wt 146 lb 8 oz (66.5 kg)   SpO2 96%   BMI 22.95 kg/m  Ideal Body Weight: Weight in (lb) to have BMI = 25: 159.3  Ideal Body Weight: Weight in (lb) to have BMI = 25: 159.3 No results found.    11/21/2023    1:16 PM 12/05/2022   12:42 PM 12/04/2021   12:13 PM 04/20/2021   12:06 PM 11/23/2019    2:44 PM  Depression screen PHQ 2/9  Decreased Interest 0 0 0 0 0  Down, Depressed, Hopeless 0 0 0 0 0  PHQ - 2 Score 0 0 0 0 0  Altered sleeping    0 0  Tired, decreased energy    0 0  Change in appetite    0 0  Feeling bad or failure about yourself     0 0  Trouble concentrating    0 0  Moving slowly or fidgety/restless    0 0  Suicidal thoughts    0 0  PHQ-9 Score    0 0  Difficult doing work/chores    Not difficult at all Not difficult at all     GEN: well developed, well nourished, no acute distress Eyes: conjunctiva and lids normal, PERRLA, EOMI Neck: supple, no lymphadenopathy, no thyromegaly, no JVD CV: regular rate and rhythm, S1-S2, no murmur, rub or gallop, no bruits, peripheral pulses normal and symmetric, no cyanosis, clubbing, edema or varicosities GI: soft, non-tender; no hepatosplenomegaly, masses; active bowel sounds all quadrants GU: deferred Lymph: no cervical, axillary or inguinal adenopathy MSK: gait normal, muscle tone and strength WNL, no joint swelling, effusions, discoloration, crepitus  SKIN: clear, good turgor, color WNL, no rashes, lesions, or ulcerations Neuro: normal mental status, normal strength, sensation, and motion Psych: alert; oriented to person, place and time, normally interactive and not anxious or depressed in appearance.   HEENT: Throat  clear, w/o exudate, R TM clear, L TM - good landmarks, No fluid present. rhinnorhea.  MMM Frontal sinuses: NT Max sinuses: NT NECK: Anterior cervical  LAD is absent PULM: Breathing comfortably in no respiratory distress. no wheezing, crackles, rhonchi   All labs reviewed with patient. Results for orders placed or performed in visit on 12/16/23  POC COVID-19   Collection Time: 12/16/23  3:00 PM  Result Value Ref Range   SARS Coronavirus 2 Ag Positive (A) Negative  POC Influenza A&B (Binax test)   Collection Time: 12/16/23  3:00 PM  Result Value Ref Range   Influenza A, POC Negative Negative   Influenza B, POC Negative Negative   Results for orders placed or performed in visit on 12/16/23  POC COVID-19   Collection Time: 12/16/23  3:00 PM  Result Value Ref Range   SARS Coronavirus 2 Ag Positive (A) Negative  POC Influenza A&B (Binax test)   Collection Time: 12/16/23  3:00 PM  Result Value Ref Range   Influenza A, POC Negative Negative   Influenza B, POC Negative Negative     Assessment and Plan:     ICD-10-CM   1. Healthcare maintenance  Z00.00     2. COVID-19  U07.1     3. Acute cough  R05.1 POC COVID-19    POC Influenza A&B (Binax test)     From a general health standpoint he is doing fairly well.  He did have some elevation in his blood pressure today, and it was elevated after his corticosteroid injection of the shoulder.  He does check his blood pressure fairly regular at home and it is roughly in the 140s generally systolic and in the 60s to 70s diastolic.  Think at this point can probably watch his blood pressure, it is possible could also be up a little bit due to his COVID-19.  He is beyond the window for Paxlovid.  Continue with supportive care and reviewed infection management.  Health Maintenance Exam: The patient's preventative maintenance and recommended screening tests for an annual wellness exam were reviewed in full today. Brought up to date unless  services declined.  Counselled on the importance of diet, exercise, and its role in overall health and mortality. The patient's  FH and SH was reviewed, including their home life, tobacco status, and drug and alcohol status.  Follow-up in 1 year for physical exam or additional follow-up below.  Disposition: No follow-ups on file.  Meds ordered this encounter  Medications   benzonatate (TESSALON) 200 MG capsule    Sig: Take 1 capsule (200 mg total) by mouth 3 (three) times daily as needed for cough.    Dispense:  40 capsule    Refill:  1   There are no discontinued medications. Orders Placed This Encounter  Procedures   POC COVID-19   POC Influenza A&B (Binax test)    Signed,  Susane Bey T. Chantale Leugers, MD   Allergies as of 12/16/2023       Reactions   Hydrocodone    pts states made him sick.   Pneumococcal Vaccine Swelling   REACTION: significant arm swelling, redness        Medication List        Accurate as of December 16, 2023  4:39 PM. If you have any questions, ask your nurse or doctor.          acetaminophen 500 MG tablet Commonly known as: TYLENOL Take 1,000 mg by mouth every 6 (six) hours as needed for mild pain or headache.   aspirin EC 81 MG tablet Take 1 tablet (81 mg total) by mouth daily.   atorvastatin 80 MG tablet Commonly known as: LIPITOR TAKE 1 TABLET BY MOUTH DAILY   benzonatate 200 MG capsule Commonly known as: TESSALON Take 1 capsule (200 mg total) by mouth 3 (three) times daily as needed for cough.   fluticasone 50 MCG/ACT nasal spray Commonly known as: FLONASE USE 2 SPRAYS IN BOTH  NOSTRILS ONCE DAILY AS  NEEDED   lisinopril 40 MG tablet Commonly known as: ZESTRIL Take 1 tablet (40 mg total) by mouth daily.   MULTIVITAMIN MEN 50+ PO Take 1 tablet by mouth daily.   nitroGLYCERIN 0.4 MG SL tablet Commonly known as: NITROSTAT Place 1 tablet (0.4 mg total) under the tongue every 5 (five) minutes as needed for chest pain.    pantoprazole 40 MG tablet Commonly known as: PROTONIX TAKE 1 TABLET BY MOUTH DAILY   Vitamin D3/Calcium/Phosphorus 120-100-78 UNIT-MG Tabs Take 1 tablet by mouth.

## 2023-12-16 ENCOUNTER — Ambulatory Visit (INDEPENDENT_AMBULATORY_CARE_PROVIDER_SITE_OTHER): Payer: Medicare Other | Admitting: Family Medicine

## 2023-12-16 ENCOUNTER — Encounter: Payer: Self-pay | Admitting: Family Medicine

## 2023-12-16 VITALS — BP 160/80 | HR 97 | Temp 99.5°F | Ht 67.0 in | Wt 146.5 lb

## 2023-12-16 DIAGNOSIS — Z0000 Encounter for general adult medical examination without abnormal findings: Secondary | ICD-10-CM

## 2023-12-16 DIAGNOSIS — R051 Acute cough: Secondary | ICD-10-CM

## 2023-12-16 DIAGNOSIS — U071 COVID-19: Secondary | ICD-10-CM | POA: Diagnosis not present

## 2023-12-16 LAB — POC INFLUENZA A&B (BINAX/QUICKVUE)
Influenza A, POC: NEGATIVE
Influenza B, POC: NEGATIVE

## 2023-12-16 LAB — POC COVID19 BINAXNOW: SARS Coronavirus 2 Ag: POSITIVE — AB

## 2023-12-16 MED ORDER — BENZONATATE 200 MG PO CAPS: 200.0000 mg | ORAL_CAPSULE | Freq: Three times a day (TID) | ORAL | 1 refills | Status: DC | PRN

## 2023-12-16 NOTE — Patient Instructions (Signed)
RSV vaccine when you are feeling better

## 2023-12-26 ENCOUNTER — Ambulatory Visit: Payer: Medicare Other | Attending: Internal Medicine | Admitting: Internal Medicine

## 2023-12-26 ENCOUNTER — Encounter: Payer: Self-pay | Admitting: Internal Medicine

## 2023-12-26 VITALS — BP 170/72 | HR 72 | Ht 68.0 in | Wt 148.4 lb

## 2023-12-26 DIAGNOSIS — E785 Hyperlipidemia, unspecified: Secondary | ICD-10-CM | POA: Diagnosis not present

## 2023-12-26 DIAGNOSIS — I351 Nonrheumatic aortic (valve) insufficiency: Secondary | ICD-10-CM

## 2023-12-26 DIAGNOSIS — Z79899 Other long term (current) drug therapy: Secondary | ICD-10-CM | POA: Diagnosis not present

## 2023-12-26 DIAGNOSIS — I340 Nonrheumatic mitral (valve) insufficiency: Secondary | ICD-10-CM | POA: Diagnosis not present

## 2023-12-26 DIAGNOSIS — I2510 Atherosclerotic heart disease of native coronary artery without angina pectoris: Secondary | ICD-10-CM | POA: Diagnosis not present

## 2023-12-26 DIAGNOSIS — Z951 Presence of aortocoronary bypass graft: Secondary | ICD-10-CM | POA: Diagnosis not present

## 2023-12-26 DIAGNOSIS — I10 Essential (primary) hypertension: Secondary | ICD-10-CM

## 2023-12-26 MED ORDER — AMLODIPINE BESYLATE 5 MG PO TABS: 5.0000 mg | ORAL_TABLET | Freq: Every day | ORAL | 3 refills | Status: DC

## 2023-12-26 NOTE — Patient Instructions (Signed)
 Medication Instructions:  Start:  Amlodipine  (Norvasc ) 5 mg once daily *If you need a refill on your cardiac medications before your next appointment, please call your pharmacy*  Lab Work: None  Testing/Procedures: Your physician has requested that you have an echocardiogram. Echocardiography is a painless test that uses sound waves to create images of your heart. It provides your doctor with information about the size and shape of your heart and how well your heart's chambers and valves are working. This procedure takes approximately one hour. There are no restrictions for this procedure. Please do NOT wear cologne, perfume, aftershave, or lotions (deodorant is allowed). Please arrive 15 minutes prior to your appointment time.   Follow-Up: At [facility], you and your health needs are our priority.  As part of our continuing mission to provide you with exceptional heart care, we have created designated Provider Care Teams.  These Care Teams include your primary Cardiologist (physician) and Advanced Practice Providers (APPs -  Physician Assistants and Nurse Practitioners) who all work together to provide you with the care you need, when you need it.  We recommend signing up for the patient portal called MyChart.  Sign up information is provided on this After Visit Summary.  MyChart is used to connect with patients for Virtual Visits (Telemedicine).  Patients are able to view lab/test results, encounter notes, upcoming appointments, etc.  Non-urgent messages can be sent to your provider as well.   To learn more about what you can do with MyChart, go to [email or web address].    Your next appointment:   6 month(s)  Provider:   Gayatri A Acharya, MD

## 2023-12-26 NOTE — Progress Notes (Signed)
 Cardiology Office Note:  .   Date:  12/26/2023  ID:  Eddie Fox, DOB Aug 19, 1941, MRN [id number] PCP: Watt Mirza, MD  [facility] Providers Cardiologist:  Soyla DELENA Merck, MD    History of Present Illness: .   Eddie Fox is a 83 y.o. male.  Discussed the use of AI scribe software for clinical note transcription with the patient, who gave verbal consent to proceed.  History of Present Illness   The patient, with a history of heart bypass surgery, presents for a routine follow-up. He reports a recent viral illness characterized by persistent coughing, which has since resolved with over-the-counter medication. The patient also mentions a cortisone shot received for R shoulder pain, which led to a transient increase in blood pressure. He has been monitoring his blood pressure at home, which has been consistently high, around 150s, despite being on Lisinopril  40mg  daily. The patient has been less active due to the cold weather, which he believes might be contributing to the elevated blood pressure. He expresses a desire to get his blood pressure back to normal, around 140.        ROS: negative except per HPI above.  Studies Reviewed: .        Results   LABS Cholesterol: LDL 47 mg/dL Triglycerides: 86 mg/dL HDL: 40 mg/dL Hemoglobin J8r: within normal limits Complete blood count: within normal limits  DIAGNOSTIC EKG: Normal (06/2023)     Risk Assessment/Calculations:        Physical Exam:   VS:  BP (!) 170/72 (BP Location: Right Arm, Patient Position: Sitting, Cuff Size: Normal)   Pulse 72   Ht 5' 8 (1.727 m)   Wt 148 lb 6.4 oz (67.3 kg)   SpO2 97%   BMI 22.56 kg/m    Wt Readings from Last 3 Encounters:  12/26/23 148 lb 6.4 oz (67.3 kg)  12/16/23 146 lb 8 oz (66.5 kg)  12/09/23 151 lb 6 oz (68.7 kg)     Physical Exam   CHEST: Lung fields clear to auscultation. CARDIOVASCULAR: 2/6 systolic murmur apex     GEN: Well nourished, well developed  in no acute distress NECK: No JVD; No carotid bruits CARDIAC: RRR, RESPIRATORY:  Clear to auscultation without rales, wheezing or rhonchi  ABDOMEN: Soft, non-tender, non-distended EXTREMITIES:  No edema; No deformity   ASSESSMENT AND PLAN: .    Assessment & Plan Aortic valve insufficiency, etiology of cardiac valve disease unspecified  Primary hypertension  Coronary artery disease involving native coronary artery of native heart without angina pectoris  Hyperlipidemia, unspecified hyperlipidemia type  S/P CABG x 4  Mitral valve insufficiency, unspecified etiology  Medication management   Assessment and Plan    Hypertension Elevated blood pressure readings at home and in clinic, possibly exacerbated by recent cortisone shot. Patient is currently on Lisinopril  40mg  daily. -Add Amlodipine  5mg  daily to current regimen. Shared decision making, considered 2.5 mg daily, patient will cut dose in half if BP drops below 120/60. -Advise patient to monitor blood pressure at home and adjust Amlodipine  dose if systolic blood pressure drops below 120.  Recent Viral Illness Patient reports recent viral illness with significant coughing, now resolved. -No further action required at this time.  Shoulder Pain Patient reports recurrent shoulder pain, recently treated with cortisone shot. -No further action required at this time.  Mitral Valve Regurgitation Audible heart murmur noted on examination, likely due to previously diagnosed mitral valve regurgitation. -Schedule echocardiogram to assess current status of mitral  valve regurgitation. - we had not scheduled a repeat as patient experienced significant pain with his last echocardiogram and deferred further follow up echos. Given increase in murmur, I do recommend we proceed and he agrees.   Follow-up in 6 months.

## 2024-01-28 ENCOUNTER — Ambulatory Visit (HOSPITAL_COMMUNITY)
Admission: RE | Admit: 2024-01-28 | Discharge: 2024-01-28 | Disposition: A | Discharge status: Home / Self Care | Payer: Medicare Other | Source: Ambulatory Visit | Attending: Cardiovascular Disease | Admitting: Cardiovascular Disease

## 2024-01-28 DIAGNOSIS — I351 Nonrheumatic aortic (valve) insufficiency: Secondary | ICD-10-CM | POA: Insufficient documentation

## 2024-01-28 DIAGNOSIS — I10 Essential (primary) hypertension: Secondary | ICD-10-CM | POA: Insufficient documentation

## 2024-01-28 DIAGNOSIS — I350 Nonrheumatic aortic (valve) stenosis: Secondary | ICD-10-CM

## 2024-01-28 LAB — ECHOCARDIOGRAM COMPLETE
AR max vel: 1.81 cm2
AV Area VTI: 1.58 cm2
AV Area mean vel: 1.85 cm2
AV Mean grad: 5 mm[Hg]
AV Peak grad: 10 mm[Hg]
Ao pk vel: 1.59 m/s
Area-P 1/2: 2.07 cm2
S' Lateral: 2.75 cm

## 2024-02-04 ENCOUNTER — Ambulatory Visit: Payer: Medicare Other | Admitting: Podiatry

## 2024-02-04 ENCOUNTER — Encounter: Payer: Self-pay | Admitting: Podiatry

## 2024-02-04 DIAGNOSIS — D2371 Other benign neoplasm of skin of right lower limb, including hip: Secondary | ICD-10-CM

## 2024-02-04 DIAGNOSIS — B351 Tinea unguium: Secondary | ICD-10-CM

## 2024-02-04 DIAGNOSIS — M79676 Pain in unspecified toe(s): Secondary | ICD-10-CM | POA: Diagnosis not present

## 2024-02-04 DIAGNOSIS — D2372 Other benign neoplasm of skin of left lower limb, including hip: Secondary | ICD-10-CM

## 2024-02-05 ENCOUNTER — Other Ambulatory Visit: Payer: Self-pay | Admitting: Family Medicine

## 2024-02-05 NOTE — Progress Notes (Signed)
He presents today chief complaint of painful elongated toenails and calluses.  Objective: There is a long thick yellow dystrophic like mycotic pulses remain palpable.  Assessment: Pain limb secondary to onychomycosis.  Plan: Debridement of toenails 1 through 5 bilateral.

## 2024-03-03 ENCOUNTER — Ambulatory Visit: Payer: Medicare Other | Admitting: Podiatry

## 2024-03-28 ENCOUNTER — Other Ambulatory Visit: Payer: Self-pay | Admitting: Family Medicine

## 2024-03-31 ENCOUNTER — Other Ambulatory Visit: Payer: Self-pay

## 2024-03-31 NOTE — Telephone Encounter (Unsigned)
 Copied from CRM [id number]. Topic: Clinical - Medication Question >> Mar 30, 2024  4:52 PM Bridgette [facility] T wrote: Reason for CRM: benzonatate (TESSALON) 200 MG capsule- checking status of refill-  [phone]

## 2024-04-02 MED ORDER — BENZONATATE 200 MG PO CAPS: 200.0000 mg | ORAL_CAPSULE | Freq: Three times a day (TID) | ORAL | 1 refills | Status: DC | PRN

## 2024-04-04 ENCOUNTER — Other Ambulatory Visit: Payer: Self-pay | Admitting: Family Medicine

## 2024-04-07 ENCOUNTER — Ambulatory Visit: Payer: Medicare Other | Admitting: Podiatry

## 2024-04-07 ENCOUNTER — Encounter: Payer: Self-pay | Admitting: Podiatry

## 2024-04-07 DIAGNOSIS — B351 Tinea unguium: Secondary | ICD-10-CM

## 2024-04-07 DIAGNOSIS — D2371 Other benign neoplasm of skin of right lower limb, including hip: Secondary | ICD-10-CM | POA: Diagnosis not present

## 2024-04-07 DIAGNOSIS — M79676 Pain in unspecified toe(s): Secondary | ICD-10-CM

## 2024-04-07 DIAGNOSIS — D2372 Other benign neoplasm of skin of left lower limb, including hip: Secondary | ICD-10-CM

## 2024-04-08 NOTE — Progress Notes (Signed)
 He presents today chief complaint of painful calluses and elongated toenails.  Objective: Toenails are long thick yellow dystrophic Klay mycotic painful on palpation.  Assessment: Pain limb secondary to onychomycosis benign skin lesions.  Plan: Debrided benign skin lesions and debrided toenails 1 through 5 bilateral.

## 2024-05-04 ENCOUNTER — Ambulatory Visit: Payer: Self-pay | Admitting: Internal Medicine

## 2024-06-09 ENCOUNTER — Ambulatory Visit: Admitting: Podiatry

## 2024-06-09 ENCOUNTER — Encounter: Payer: Self-pay | Admitting: Podiatry

## 2024-06-09 DIAGNOSIS — B351 Tinea unguium: Secondary | ICD-10-CM

## 2024-06-09 DIAGNOSIS — D2372 Other benign neoplasm of skin of left lower limb, including hip: Secondary | ICD-10-CM | POA: Diagnosis not present

## 2024-06-09 DIAGNOSIS — D2371 Other benign neoplasm of skin of right lower limb, including hip: Secondary | ICD-10-CM | POA: Diagnosis not present

## 2024-06-09 DIAGNOSIS — M79676 Pain in unspecified toe(s): Secondary | ICD-10-CM

## 2024-06-09 NOTE — Progress Notes (Signed)
 He presents today chief complaint of painful calluses and elongated toenails.  Objective: Toenails are long thick yellow dystrophic Klay mycotic painful on palpation.  Assessment: Pain limb secondary to onychomycosis benign skin lesions.  Plan: Debrided benign skin lesions and debrided toenails 1 through 5 bilateral.

## 2024-06-16 DIAGNOSIS — D692 Other nonthrombocytopenic purpura: Secondary | ICD-10-CM | POA: Diagnosis not present

## 2024-06-16 DIAGNOSIS — D2262 Melanocytic nevi of left upper limb, including shoulder: Secondary | ICD-10-CM | POA: Diagnosis not present

## 2024-06-16 DIAGNOSIS — D0439 Carcinoma in situ of skin of other parts of face: Secondary | ICD-10-CM | POA: Diagnosis not present

## 2024-06-16 DIAGNOSIS — L821 Other seborrheic keratosis: Secondary | ICD-10-CM | POA: Diagnosis not present

## 2024-06-16 DIAGNOSIS — Z85828 Personal history of other malignant neoplasm of skin: Secondary | ICD-10-CM | POA: Diagnosis not present

## 2024-06-16 DIAGNOSIS — L570 Actinic keratosis: Secondary | ICD-10-CM | POA: Diagnosis not present

## 2024-06-16 DIAGNOSIS — C44319 Basal cell carcinoma of skin of other parts of face: Secondary | ICD-10-CM | POA: Diagnosis not present

## 2024-06-16 DIAGNOSIS — D485 Neoplasm of uncertain behavior of skin: Secondary | ICD-10-CM | POA: Diagnosis not present

## 2024-06-16 DIAGNOSIS — D2261 Melanocytic nevi of right upper limb, including shoulder: Secondary | ICD-10-CM | POA: Diagnosis not present

## 2024-06-16 DIAGNOSIS — D225 Melanocytic nevi of trunk: Secondary | ICD-10-CM | POA: Diagnosis not present

## 2024-06-16 DIAGNOSIS — L814 Other melanin hyperpigmentation: Secondary | ICD-10-CM | POA: Diagnosis not present

## 2024-06-20 ENCOUNTER — Other Ambulatory Visit: Payer: Self-pay | Admitting: Internal Medicine

## 2024-07-17 ENCOUNTER — Other Ambulatory Visit: Payer: Self-pay | Admitting: Internal Medicine

## 2024-08-11 ENCOUNTER — Encounter: Payer: Self-pay | Admitting: Podiatry

## 2024-08-11 ENCOUNTER — Ambulatory Visit: Admitting: Podiatry

## 2024-08-11 DIAGNOSIS — M79676 Pain in unspecified toe(s): Secondary | ICD-10-CM | POA: Diagnosis not present

## 2024-08-11 DIAGNOSIS — D2371 Other benign neoplasm of skin of right lower limb, including hip: Secondary | ICD-10-CM | POA: Diagnosis not present

## 2024-08-11 DIAGNOSIS — B351 Tinea unguium: Secondary | ICD-10-CM

## 2024-08-11 DIAGNOSIS — D2372 Other benign neoplasm of skin of left lower limb, including hip: Secondary | ICD-10-CM

## 2024-08-12 NOTE — Progress Notes (Signed)
 He presents today chief complaint of painful calluses and elongated toenails.  Objective: Toenails are long thick yellow dystrophic Klay mycotic painful on palpation.  Assessment: Pain limb secondary to onychomycosis benign skin lesions.  Plan: Debrided benign skin lesions and debrided toenails 1 through 5 bilateral.

## 2024-08-14 ENCOUNTER — Ambulatory Visit: Attending: Internal Medicine | Admitting: Internal Medicine

## 2024-08-14 ENCOUNTER — Other Ambulatory Visit (HOSPITAL_COMMUNITY): Payer: Self-pay

## 2024-08-14 ENCOUNTER — Encounter: Payer: Self-pay | Admitting: Internal Medicine

## 2024-08-14 VITALS — BP 152/60 | HR 76 | Resp 18 | Ht 71.0 in | Wt 151.7 lb

## 2024-08-14 DIAGNOSIS — I2510 Atherosclerotic heart disease of native coronary artery without angina pectoris: Secondary | ICD-10-CM | POA: Diagnosis not present

## 2024-08-14 DIAGNOSIS — I351 Nonrheumatic aortic (valve) insufficiency: Secondary | ICD-10-CM | POA: Diagnosis not present

## 2024-08-14 DIAGNOSIS — Z951 Presence of aortocoronary bypass graft: Secondary | ICD-10-CM | POA: Diagnosis not present

## 2024-08-14 DIAGNOSIS — I340 Nonrheumatic mitral (valve) insufficiency: Secondary | ICD-10-CM

## 2024-08-14 DIAGNOSIS — Z79899 Other long term (current) drug therapy: Secondary | ICD-10-CM | POA: Diagnosis not present

## 2024-08-14 MED ORDER — AMLODIPINE BESYLATE 5 MG PO TABS
7.5000 mg | ORAL_TABLET | Freq: Every day | ORAL | 2 refills | Status: DC
  Filled 2024-08-14: qty 135, 90d supply, fill #0

## 2024-08-14 NOTE — Progress Notes (Signed)
 Cardiology Office Note:  .   Date:  08/14/2024  ID:  Lamar ONEIDA Huntsman, DOB Aug 26, 1941, MRN [id number] PCP: Watt Mirza, MD  [facility] Providers Cardiologist:  Soyla DELENA Merck, MD    History of Present Illness: .   EBIN PALAZZI is a 83 y.o. male.  Discussed the use of AI scribe software for clinical note transcription with the patient, who gave verbal consent to proceed.  History of Present Illness KANYE DEPREE is an 83 year old male with history of CABG, hypertension and mitral valve regurgitation who presents for blood pressure management.  His blood pressure was 149 mmHg this morning and has been elevated today. He aims to maintain it around 140 mmHg. He checks his blood pressure monthly before bed and recalls a spike to 180 mmHg after consuming ham salad, which led him to take an additional dose of amlodipine . He adheres to a low sodium diet, consuming foods like chicken salad, green beans, and corn.  He is on amlodipine  for blood pressure management, added after his last cardiology visit in January. He takes it in the morning and finds it effective throughout the day. Occasionally, he takes an extra dose if his blood pressure rises, though this is infrequent.  He has a history of coronary artery bypass grafting with a prolonged recovery. He reports that he is not interested in further interventions for his mitral valve regurgitation and prefers to avoid additional surgery. He lives in [place], with support from his daughter and nephew during his recovery.  No dizziness, lightheadedness, shortness of breath, or leg swelling. He attributes a recent fall to tripping rather than a medical issue. He has a persistent cough addressed well with tessalon  perles.     ROS: negative except per HPI above.  Studies Reviewed: SABRA   EKG Interpretation Date/Time:  Friday August 14 2024 08:30:21 EDT Ventricular Rate:  76 PR Interval:  164 QRS Duration:  96 QT  Interval:  402 QTC Calculation: 452 R Axis:   -63  Text Interpretation: Sinus rhythm with Premature supraventricular complexes and with occasional Premature ventricular complexes Left axis deviation Nonspecific ST abnormality When compared with ECG of 19-Jun-2023 08:14, Premature ventricular complexes are now Present Premature supraventricular complexes are now Present Confirmed by Merck Soyla ([id number]) on 08/14/2024 8:46:46 AM    Results DIAGNOSTIC EKG: Normal Echocardiogram: Mitral valve regurgitation, possibly severe (January 2025) Risk Assessment/Calculations:       Physical Exam:   VS:  BP (!) 152/60 (BP Location: Left Arm, Patient Position: Sitting, Cuff Size: Normal)   Pulse 76   Resp 18   Ht 5' 11 (1.803 m)   Wt 151 lb 11.2 oz (68.8 kg)   BMI 21.16 kg/m    Wt Readings from Last 3 Encounters:  08/14/24 151 lb 11.2 oz (68.8 kg)  12/26/23 148 lb 6.4 oz (67.3 kg)  12/16/23 146 lb 8 oz (66.5 kg)     Physical Exam GENERAL: Alert, cooperative, well developed, no acute distress. HEENT: Normocephalic, normal oropharynx, moist mucous membranes. CHEST: Clear to auscultation bilaterally, no wheezes, rhonchi, or crackles. CARDIOVASCULAR: 2/6 Systolic murmur at left precordium and apex, normal heart rate and rhythm, S1 and S2 normal. ABDOMEN: Soft, non-tender, non-distended, without organomegaly, normal bowel sounds. EXTREMITIES: No cyanosis or edema. NEUROLOGICAL: Cranial nerves grossly intact, moves all extremities without gross motor or sensory deficit.   ASSESSMENT AND PLAN: .    Assessment and Plan Assessment & Plan Mitral valve regurgitation with hypertension At least  moderate eccentric mitral valve regurgitation potentially worsened by uncontrolled hypertension. Blood pressure at 149 mmHg, higher than desired. Asymptomatic, prefers to avoid surgery due to past recovery issues. Risks include worsening valve leakage, atrial fibrillation, and dyspnea. BP control may be  primary strategy to help with MR if no interventions desired by patient. We did discuss mTEER as an option at some point, he is not inclined. - Increase amlodipine  to 7.5 mg daily by taking one and a half tablets. - continue lisinopril  40 mg daily.  - Monitor blood pressure regularly to maintain it between 130-140 mmHg. - Continue to monitor sodium intake closely. - Repeat echocardiogram in six months to assess mitral valve regurgitation.  Atherosclerotic heart disease of native coronary artery, status post coronary artery bypass grafting Status post coronary artery bypass grafting with a protracted recovery. Prefers non-surgical management. Focus on blood pressure control and heart health monitoring to prevent complications. - Continue asa 81 mg daily, atorvastatin  80 mg daily, lisinopril  40 mg daily.  - Discuss any new symptoms or concerns with primary care provider or cardiologist.      Soyla Merck, MD, FACC

## 2024-08-14 NOTE — Patient Instructions (Signed)
 Medication Instructions:   INCREASE YOUR AMLODIPINE  TO 7.5 MG BY MOUTH DAILY  *If you need a refill on your cardiac medications before your next appointment, please call your pharmacy*    Testing/Procedures:  Your physician has requested that you have an echocardiogram. Echocardiography is a painless test that uses sound waves to create images of your heart. It provides your doctor with information about the size and shape of your heart and how well your heart's chambers and valves are working. This procedure takes approximately one hour. There are no restrictions for this procedure.  SCHEDULE ECHO TO BE DONE IN 6 MONTHS WITH A FOLLOW-UP VISIT WITH DR. ACHARYA THEREAFTER  Please do NOT wear cologne, perfume, aftershave, or lotions (deodorant is allowed). Please arrive 15 minutes prior to your appointment time.  Please note: We ask at that you not bring children with you during ultrasound (echo/ vascular) testing. Due to room size and safety concerns, children are not allowed in the ultrasound rooms during exams. Our front office staff cannot provide observation of children in our lobby area while testing is being conducted. An adult accompanying a patient to their appointment will only be allowed in the ultrasound room at the discretion of the ultrasound technician under special circumstances. We apologize for any inconvenience.   Follow-Up:  6 MONTHS WITH DR. ACHARYA --NEEDS 6 MONTH ECHO COMPLETED BEFORE THIS VISIT

## 2024-08-25 ENCOUNTER — Other Ambulatory Visit (HOSPITAL_COMMUNITY): Payer: Self-pay

## 2024-08-30 ENCOUNTER — Other Ambulatory Visit: Payer: Self-pay | Admitting: Internal Medicine

## 2024-08-30 DIAGNOSIS — I2510 Atherosclerotic heart disease of native coronary artery without angina pectoris: Secondary | ICD-10-CM

## 2024-08-30 DIAGNOSIS — I351 Nonrheumatic aortic (valve) insufficiency: Secondary | ICD-10-CM

## 2024-08-30 DIAGNOSIS — Z951 Presence of aortocoronary bypass graft: Secondary | ICD-10-CM

## 2024-08-30 DIAGNOSIS — I340 Nonrheumatic mitral (valve) insufficiency: Secondary | ICD-10-CM

## 2024-09-09 MED ORDER — LISINOPRIL 40 MG PO TABS: 40.0000 mg | ORAL_TABLET | Freq: Every day | ORAL | 3 refills | Status: AC

## 2024-09-09 MED ORDER — ATORVASTATIN CALCIUM 80 MG PO TABS: 80.0000 mg | ORAL_TABLET | Freq: Every day | ORAL | 3 refills | Status: AC

## 2024-10-09 DIAGNOSIS — H52203 Unspecified astigmatism, bilateral: Secondary | ICD-10-CM | POA: Diagnosis not present

## 2024-10-09 DIAGNOSIS — Z961 Presence of intraocular lens: Secondary | ICD-10-CM | POA: Diagnosis not present

## 2024-10-12 ENCOUNTER — Other Ambulatory Visit: Payer: Self-pay | Admitting: Family Medicine

## 2024-10-13 ENCOUNTER — Encounter: Payer: Self-pay | Admitting: Podiatry

## 2024-10-13 ENCOUNTER — Ambulatory Visit: Admitting: Podiatry

## 2024-10-13 DIAGNOSIS — M79676 Pain in unspecified toe(s): Secondary | ICD-10-CM | POA: Diagnosis not present

## 2024-10-13 DIAGNOSIS — B351 Tinea unguium: Secondary | ICD-10-CM

## 2024-10-13 DIAGNOSIS — D2371 Other benign neoplasm of skin of right lower limb, including hip: Secondary | ICD-10-CM | POA: Diagnosis not present

## 2024-10-13 DIAGNOSIS — D2372 Other benign neoplasm of skin of left lower limb, including hip: Secondary | ICD-10-CM

## 2024-10-14 NOTE — Progress Notes (Signed)
 He presents today chief complaint of painful elongated toenails and calluses.  Objective: There is a long thick yellow dystrophic like mycotic pulses remain palpable.  Benign lesions plantar aspect of the bilateral foot  Assessment: Pain limb secondary to onychomycosis.  Benign neoplasms bilateral foot  Plan: Debridement of toenails 1 through 5 bilateral.  Debridement of benign neoplasms.

## 2024-11-23 ENCOUNTER — Telehealth: Payer: Self-pay | Admitting: *Deleted

## 2024-11-23 DIAGNOSIS — Z79899 Other long term (current) drug therapy: Secondary | ICD-10-CM

## 2024-11-23 DIAGNOSIS — C181 Malignant neoplasm of appendix: Secondary | ICD-10-CM

## 2024-11-23 DIAGNOSIS — Z131 Encounter for screening for diabetes mellitus: Secondary | ICD-10-CM

## 2024-11-23 DIAGNOSIS — E7800 Pure hypercholesterolemia, unspecified: Secondary | ICD-10-CM

## 2024-11-23 DIAGNOSIS — E559 Vitamin D deficiency, unspecified: Secondary | ICD-10-CM

## 2024-11-23 NOTE — Telephone Encounter (Signed)
-----   Message from Harlene Du sent at 11/20/2024 10:05 AM EST ----- Regarding: Lab Wed 12/09/24 Hello,  Patient is coming in for CPE labs. Can we get orders please.   Thanks

## 2024-12-08 ENCOUNTER — Ambulatory Visit: Admitting: Podiatry

## 2024-12-08 DIAGNOSIS — B351 Tinea unguium: Secondary | ICD-10-CM | POA: Diagnosis not present

## 2024-12-08 DIAGNOSIS — M79676 Pain in unspecified toe(s): Secondary | ICD-10-CM

## 2024-12-08 DIAGNOSIS — D2372 Other benign neoplasm of skin of left lower limb, including hip: Secondary | ICD-10-CM

## 2024-12-08 DIAGNOSIS — D2371 Other benign neoplasm of skin of right lower limb, including hip: Secondary | ICD-10-CM

## 2024-12-08 NOTE — Progress Notes (Signed)
 He presents today chief complaint of painful elongated toenails and calluses.  Objective: There is a long thick yellow dystrophic like mycotic pulses remain palpable.  Benign lesions plantar aspect of the bilateral foot  Assessment: Pain limb secondary to onychomycosis.  Benign neoplasms bilateral foot  Plan: Debridement of toenails 1 through 5 bilateral.  Debridement of benign neoplasms.

## 2024-12-09 ENCOUNTER — Other Ambulatory Visit (INDEPENDENT_AMBULATORY_CARE_PROVIDER_SITE_OTHER)

## 2024-12-09 DIAGNOSIS — Z79899 Other long term (current) drug therapy: Secondary | ICD-10-CM

## 2024-12-09 DIAGNOSIS — Z131 Encounter for screening for diabetes mellitus: Secondary | ICD-10-CM | POA: Diagnosis not present

## 2024-12-09 DIAGNOSIS — E559 Vitamin D deficiency, unspecified: Secondary | ICD-10-CM | POA: Diagnosis not present

## 2024-12-09 DIAGNOSIS — C181 Malignant neoplasm of appendix: Secondary | ICD-10-CM

## 2024-12-09 DIAGNOSIS — E7800 Pure hypercholesterolemia, unspecified: Secondary | ICD-10-CM

## 2024-12-09 LAB — COMPREHENSIVE METABOLIC PANEL WITH GFR
ALT: 9 U/L (ref 3–53)
AST: 15 U/L (ref 5–37)
Albumin: 4 g/dL (ref 3.5–5.2)
Alkaline Phosphatase: 44 U/L (ref 39–117)
BUN: 20 mg/dL (ref 6–23)
CO2: 29 meq/L (ref 19–32)
Calcium: 8.7 mg/dL (ref 8.4–10.5)
Chloride: 106 meq/L (ref 96–112)
Creatinine, Ser: 1.18 mg/dL (ref 0.40–1.50)
GFR: 57.42 mL/min — ABNORMAL LOW
Glucose, Bld: 81 mg/dL (ref 70–99)
Potassium: 4.2 meq/L (ref 3.5–5.1)
Sodium: 143 meq/L (ref 135–145)
Total Bilirubin: 0.6 mg/dL (ref 0.2–1.2)
Total Protein: 5.9 g/dL — ABNORMAL LOW (ref 6.0–8.3)

## 2024-12-09 LAB — LIPID PANEL
Cholesterol: 80 mg/dL (ref 28–200)
HDL: 34.9 mg/dL — ABNORMAL LOW
LDL Cholesterol: 32 mg/dL (ref 10–99)
NonHDL: 45.53
Total CHOL/HDL Ratio: 2
Triglycerides: 70 mg/dL (ref 10.0–149.0)
VLDL: 14 mg/dL (ref 0.0–40.0)

## 2024-12-09 LAB — VITAMIN D 25 HYDROXY (VIT D DEFICIENCY, FRACTURES): VITD: 46.49 ng/mL (ref 30.00–100.00)

## 2024-12-09 LAB — CBC WITH DIFFERENTIAL/PLATELET
Basophils Absolute: 0 K/uL (ref 0.0–0.1)
Basophils Relative: 0.5 % (ref 0.0–3.0)
Eosinophils Absolute: 0.3 K/uL (ref 0.0–0.7)
Eosinophils Relative: 4.6 % (ref 0.0–5.0)
HCT: 40.7 % (ref 39.0–52.0)
Hemoglobin: 13.7 g/dL (ref 13.0–17.0)
Lymphocytes Relative: 37.8 % (ref 12.0–46.0)
Lymphs Abs: 2.5 K/uL (ref 0.7–4.0)
MCHC: 33.7 g/dL (ref 30.0–36.0)
MCV: 97.9 fl (ref 78.0–100.0)
Monocytes Absolute: 0.4 K/uL (ref 0.1–1.0)
Monocytes Relative: 5.7 % (ref 3.0–12.0)
Neutro Abs: 3.4 K/uL (ref 1.4–7.7)
Neutrophils Relative %: 51.4 % (ref 43.0–77.0)
Platelets: 152 K/uL (ref 150.0–400.0)
RBC: 4.16 Mil/uL — ABNORMAL LOW (ref 4.22–5.81)
RDW: 12.9 % (ref 11.5–15.5)
WBC: 6.6 K/uL (ref 4.0–10.5)

## 2024-12-09 LAB — HEMOGLOBIN A1C: Hgb A1c MFr Bld: 5.1 % (ref 4.6–6.5)

## 2024-12-11 LAB — CEA: CEA: <2 ng/mL

## 2024-12-15 ENCOUNTER — Encounter: Payer: Self-pay | Admitting: Family Medicine

## 2024-12-15 NOTE — Progress Notes (Unsigned)
 "    Eddie Heaphy T. Mirage Pfefferkorn, MD, CAQ Sports Medicine [facility] [address] [place], [address]  Phone: [phone]  FAX: [phone]  Eddie Fox - 83 y.o. male  MRN [id number]  Date of Birth: [birth year]  Date: 12/16/2024  PCP: Watt Mirza, MD  Referral: Watt Mirza, MD  No chief complaint on file.  Patient Care Team: Watt Mirza, MD as PCP - General Loni Soyla LABOR, MD as PCP - Cardiology (Cardiology) Joshua Sieving, MD as Consulting Physician (Dermatology) Charmayne Molly, MD as Consulting Physician (Ophthalmology) Subjective:   Eddie Fox is a 83 y.o. pleasant patient who presents for a medicare wellness examination:  Preventative Health Maintenance Visit:  Health Maintenance Summary Reviewed and updated, unless pt declines services.  Tobacco History Reviewed. Alcohol: No concerns, no excessive use Exercise Habits: Some activity, rec at least 30 mins 5 times a week STD concerns: no risk or activity to increase risk Drug Use: None  Health Maintenance  Topic Date Due   Influenza Vaccine  07/17/2024   COVID-19 Vaccine (5 - 2025-26 season) 08/17/2024   Medicare Annual Wellness (AWV)  11/20/2024   DTaP/Tdap/Td (4 - Td or Tdap) 01/02/2032   Pneumococcal Vaccine: 50+ Years  Completed   Zoster Vaccines- Shingrix  Completed   Meningococcal B Vaccine  Aged Out   Hepatitis C Screening  Discontinued   Flu vaccine  He is a pleasant gentleman, known for many years.  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  History of melanoma of the abdominal wall.  Discussed the use of AI scribe software for clinical note transcription with the patient, who gave verbal consent to proceed.  History of Present Illness     Immunization History  Administered Date(s) Administered   Fluad Quad(high Dose 65+) 10/23/2023   INFLUENZA, HIGH DOSE  SEASONAL PF 08/31/2020, 09/06/2021, 03/13/2022   Influenza Split 12/25/2011   Influenza,inj,Quad PF,6+ Mos 08/19/2013, 08/26/2014, 09/14/2015, 09/11/2016, 08/28/2017, 08/27/2018, 08/10/2019   PFIZER(Purple Top)SARS-COV-2 Vaccination 01/06/2020, 01/27/2020   Pfizer Covid-19 Vaccine Bivalent Booster 53yrs & up 09/07/2021, 10/23/2023   Pneumococcal Conjugate-13 02/16/2015   Pneumococcal Polysaccharide-23 06/28/2022   Td 03/24/2002, 12/22/2009   Tdap 01/01/2022   Zoster Recombinant(Shingrix) 01/03/2022, 03/13/2022    Patient Active Problem List   Diagnosis Date Noted   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    Priority: High   CAD (coronary artery disease), native coronary artery 11/18/2020    Priority: High   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    Priority: High   Melanoma of abdominal wall (HCC) 01/12/2014    Priority: Medium    Essential hypertension 09/14/2008    Priority: Medium    Hyperlipidemia 07/08/2007    Priority: Medium    Atrial bigeminy 09/26/2020   Vitamin D  deficiency 01/30/2010   FATTY LIVER DISEASE 08/12/2009   ERECTILE DYSFUNCTION 07/08/2007   PEYRONIE'S DISEASE, 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   Unspecified essential hypertension    Unspecified vitamin D  deficiency  Past Surgical History:  Procedure Laterality Date   AMPUTATION     distal phalanx of right thumb- traumatic   CHOLECYSTECTOMY  10/03/09   lap   CORONARY ARTERY BYPASS GRAFT N/A 11/21/2020   Procedure: CORONARY ARTERY BYPASS GRAFTING (CABG) X 4, USING LEFT INTERNAL MAMMARY ARTERY AND RIGHT LEG GREATER SAPHENOUS VEIN HARVESTED ENDOSCOPICALLY;   Surgeon: Shyrl Linnie KIDD, MD;  Location: [facility] OR;  Service: Open Heart Surgery;  Laterality: N/A;   LAMINECTOMY  1995   L 3-T11 Harrington rods in back   LAPAROSCOPIC APPENDECTOMY N/A 05/16/2016   Procedure: APPENDECTOMY LAPAROSCOPIC;  Surgeon: Camellia Blush, MD;  Location: [facility] OR;  Service: General;  Laterality: N/A;   LEFT HEART CATH AND CORONARY ANGIOGRAPHY N/A 11/18/2020   Procedure: LEFT HEART CATH AND CORONARY ANGIOGRAPHY;  Surgeon: Burnard Debby LABOR, MD;  Location: [facility] INVASIVE CV LAB;  Service: Cardiovascular;  Laterality: N/A;   MELANOMA EXCISION  1995   right upper abd, Dr. Joshua LABRADOR HEAD EXCISION Right 06/23/2020   Procedure: RIGHT 5TH METATARSAL OSTEOTOMY WITH BUNIONETTE EXCISION, OPEN REDUCTION OF FIFTH METATARSAL PHALANGEAL JOINT, FIFTH METATARSAL PHALANGEAL CAPSULOTOMY, BURSECTOMY;  Surgeon: Elsa Lonni SAUNDERS, MD;  Location: [facility];  Service: Orthopedics;  Laterality: Right;  LENGTH OF SURGERY: 1.5 HOURS   ORIF ANKLE FRACTURE  1967   TEE WITHOUT CARDIOVERSION N/A 11/21/2020   Procedure: TRANSESOPHAGEAL ECHOCARDIOGRAM (TEE);  Surgeon: Shyrl Linnie KIDD, MD;  Location: [facility] OR;  Service: Open Heart Surgery;  Laterality: N/A;    Family History  Problem Relation Age of Onset   Heart disease Mother        CHF   Hypertension Mother    Diabetes Mother    Stroke Mother    Hypertension Father    Heart disease Father        CHF   Hypertension Sister    Hypertension Sister    Depression Neg Hx    Alcohol abuse Neg Hx    Drug abuse Neg Hx     Social History   Social History Narrative   Not on file    Past Medical History, Surgical History, Social History, Family History, Problem List, Medications, and Allergies have been reviewed and updated if relevant.  Review of Systems: Pertinent positives are listed above.  Otherwise, a full 14 point review of systems has been done in full and it is negative except where it is noted positive.  Objective:    There were no vitals taken for this visit.    11/25/2020    8:13 AM 04/20/2021   12:07 PM 12/04/2021   12:10 PM 12/05/2022   12:40 PM 11/21/2023    1:12 PM  Fall Risk  Falls in the past year?  0 1 0 0  Was there an injury with Fall?   0   0   Fall Risk Category Calculator   1  0  Fall Risk Category (Retired)   Low     (RETIRED) Patient Fall Risk Level Moderate fall risk  Low fall risk  Low fall risk  Low fall risk    Patient at Risk for Falls Due to   Other (Comment)  No Fall Risks  Patient at Risk for Falls Due to - Comments   tripped over curb    Fall risk Follow up   Falls prevention discussed  Falls evaluation completed;Falls prevention discussed  Falls prevention discussed;Education provided     Data saved with a previous flowsheet row definition  Ideal Body Weight:   No results found.    11/21/2023    1:16 PM 12/05/2022   12:42 PM 12/04/2021   12:13 PM 04/20/2021   12:06 PM 11/23/2019    2:44 PM  Depression screen PHQ 2/9  Decreased Interest 0 0 0 0 0  Down, Depressed, Hopeless 0 0 0 0 0  PHQ - 2 Score 0 0 0 0 0  Altered sleeping    0 0  Tired, decreased energy    0 0  Change in appetite    0 0  Feeling bad or failure about yourself     0 0  Trouble concentrating    0 0  Moving slowly or fidgety/restless    0 0  Suicidal thoughts    0 0  PHQ-9 Score    0  0   Difficult doing work/chores    Not difficult at all Not difficult at all     Data saved with a previous flowsheet row definition     GEN: well developed, well nourished, no acute distress Eyes: conjunctiva and lids normal, PERRLA, EOMI ENT: TM clear, nares clear, oral exam WNL Neck: supple, no lymphadenopathy, no thyromegaly, no JVD Pulm: clear to auscultation and percussion, respiratory effort normal CV: regular rate and rhythm, S1-S2, no murmur, rub or gallop, no bruits, peripheral pulses normal and symmetric, no cyanosis, clubbing, edema or varicosities GI: soft, non-tender; no hepatosplenomegaly,  masses; active bowel sounds all quadrants GU: deferred Lymph: no cervical, axillary or inguinal adenopathy MSK: gait normal, muscle tone and strength WNL, no joint swelling, effusions, discoloration, crepitus  SKIN: clear, good turgor, color WNL, no rashes, lesions, or ulcerations Neuro: normal mental status, normal strength, sensation, and motion Psych: alert; oriented to person, place and time, normally interactive and not anxious or depressed in appearance.  All labs reviewed with patient.  @resultssec @  Results for orders placed or performed in visit on 12/09/24  CEA   Collection Time: 12/09/24  9:33 AM  Result Value Ref Range   CEA <2.0 ng/mL  CBC with Differential/Platelet   Collection Time: 12/09/24  9:33 AM  Result Value Ref Range   WBC 6.6 4.0 - 10.5 K/uL   RBC 4.16 (L) 4.22 - 5.81 Mil/uL   Hemoglobin 13.7 13.0 - 17.0 g/dL   HCT 59.2 60.9 - 47.9 %   MCV 97.9 78.0 - 100.0 fl   MCHC 33.7 30.0 - 36.0 g/dL   RDW 87.0 88.4 - 84.4 %   Platelets 152.0 150.0 - 400.0 K/uL   Neutrophils Relative % 51.4 43.0 - 77.0 %   Lymphocytes Relative 37.8 12.0 - 46.0 %   Monocytes Relative 5.7 3.0 - 12.0 %   Eosinophils Relative 4.6 0.0 - 5.0 %   Basophils Relative 0.5 0.0 - 3.0 %   Neutro Abs 3.4 1.4 - 7.7 K/uL   Lymphs Abs 2.5 0.7 - 4.0 K/uL   Monocytes Absolute 0.4 0.1 - 1.0 K/uL   Eosinophils Absolute 0.3 0.0 - 0.7 K/uL   Basophils Absolute 0.0 0.0 - 0.1 K/uL  Lipid panel   Collection Time: 12/09/24  9:33 AM  Result Value Ref Range   Cholesterol 80 28 - 200 mg/dL   Triglycerides 29.9 89.9 - 149.0 mg/dL   HDL 65.09 (L) >60.99 mg/dL   VLDL 85.9 0.0 - 59.9 mg/dL   LDL Cholesterol 32 10 - 99 mg/dL   Total CHOL/HDL Ratio 2    NonHDL 45.53   Comprehensive metabolic panel   Collection  Time: 12/09/24  9:33 AM  Result Value Ref Range   Sodium 143 135 - 145 mEq/L   Potassium 4.2 3.5 - 5.1 mEq/L   Chloride 106 96 - 112 mEq/L   CO2 29 19 - 32 mEq/L   Glucose, Bld 81 70 - 99 mg/dL    BUN 20 6 - 23 mg/dL   Creatinine, Ser 8.81 0.40 - 1.50 mg/dL   Total Bilirubin 0.6 0.2 - 1.2 mg/dL   Alkaline Phosphatase 44 39 - 117 U/L   AST 15 5 - 37 U/L   ALT 9 3 - 53 U/L   Total Protein 5.9 (L) 6.0 - 8.3 g/dL   Albumin  4.0 3.5 - 5.2 g/dL   GFR 42.57 (L) >39.99 mL/min   Calcium  8.7 8.4 - 10.5 mg/dL  Hemoglobin J8r   Collection Time: 12/09/24  9:33 AM  Result Value Ref Range   Hgb A1c MFr Bld 5.1 4.6 - 6.5 %  VITAMIN D  25 Hydroxy (Vit-D Deficiency, Fractures)   Collection Time: 12/09/24  9:33 AM  Result Value Ref Range   VITD 46.49 30.00 - 100.00 ng/mL    Assessment and Plan:     ICD-10-CM   1. Healthcare maintenance  Z00.00       Assessment and Plan Assessment & Plan      Health Maintenance Exam: The patient's preventative maintenance and recommended screening tests for an annual wellness exam were reviewed in full today. Brought up to date unless services declined.  Counselled on the importance of diet, exercise, and its role in overall health and mortality. The patient's FH and SH was reviewed, including their home life, tobacco status, and drug and alcohol status.  Follow-up in 1 year for physical exam or additional follow-up below.  I have personally reviewed the Medicare Annual Wellness questionnaire and have noted 1. The patient's medical and social history 2. Their use of alcohol, tobacco or illicit drugs 3. Their current medications and supplements 4. The patient's functional ability including ADL's, fall risks, home safety risks and hearing or visual             impairment. 5. Diet and physical activities 6. Evidence for depression or mood disorders 7. Reviewed Updated provider list, see scanned forms and [facility] Snapshot.  8. Reviewed whether or not the patient has HCPOA or living will, and discussed what this means with the patient.  Recommended he bring in a copy for his chart in [facility].  The patients weight, height, BMI and visual acuity have been  recorded in the chart I have made referrals, counseling and provided education to the patient based review of the above and I have provided the pt with a written personalized care plan for preventive services.  I have provided the patient with a copy of your personalized plan for preventive services. Instructed to take the time to review along with their updated medication list.  Disposition: No follow-ups on file.  Future Appointments  Date Time Provider Department Center  12/16/2024  9:20 AM Watt Mirza, MD [id number] [id number]  02/08/2025 11:15 AM [facility]  02/09/2025  3:15 PM Hyatt, Max T, DPM [facility]    No orders of the defined types were placed in this encounter.  There are no discontinued medications. No orders of the defined types were placed in this encounter.   Signed,  Eddie Fox. Mayci Haning, MD   Allergies as of 12/16/2024       Reactions   Hydrocodone   pts states made him sick.   Pneumococcal Vaccine Swelling   REACTION: significant arm swelling, redness        Medication List        Accurate as of December 15, 2024  1:34 PM. If you have any questions, ask your nurse or doctor.          acetaminophen  500 MG tablet Commonly known as: TYLENOL  Take 1,000 mg by mouth every 6 (six) hours as needed for mild pain or headache.   amLODipine  5 MG tablet Commonly known as: NORVASC  Take 1.5 tablets (7.5 mg total) by mouth daily.   aspirin  EC 81 MG tablet Take 1 tablet (81 mg total) by mouth daily.   atorvastatin  80 MG tablet Commonly known as: LIPITOR  Take 1 tablet (80 mg total) by mouth daily.   benzonatate  200 MG capsule Commonly known as: TESSALON  TAKE 1 CAPSULE (200 MG TOTAL) BY MOUTH 3 (THREE) TIMES DAILY AS NEEDED FOR COUGH.   fluticasone  50 MCG/ACT nasal spray Commonly known as: FLONASE  USE 2 SPRAYS IN BOTH  NOSTRILS ONCE DAILY AS  NEEDED   lisinopril  40 MG tablet Commonly known as: ZESTRIL  Take 1 tablet (40 mg  total) by mouth daily.   MULTIVITAMIN MEN 50+ PO Take 1 tablet by mouth daily.   nitroGLYCERIN  0.4 MG SL tablet Commonly known as: NITROSTAT  Place 1 tablet (0.4 mg total) under the tongue every 5 (five) minutes as needed for chest pain.   pantoprazole  40 MG tablet Commonly known as: PROTONIX  TAKE 1 TABLET BY MOUTH DAILY   Vitamin D3/Calcium /Phosphorus 120-100-78 UNIT-MG Tabs Take 1 tablet by mouth.        "

## 2024-12-16 ENCOUNTER — Ambulatory Visit: Admitting: Family Medicine

## 2024-12-16 ENCOUNTER — Encounter: Payer: Self-pay | Admitting: Family Medicine

## 2024-12-16 VITALS — BP 128/60 | HR 49 | Temp 98.4°F | Ht 66.5 in | Wt 150.1 lb

## 2024-12-16 DIAGNOSIS — E782 Mixed hyperlipidemia: Secondary | ICD-10-CM

## 2024-12-16 DIAGNOSIS — C181 Malignant neoplasm of appendix: Secondary | ICD-10-CM | POA: Diagnosis not present

## 2024-12-16 DIAGNOSIS — I10 Essential (primary) hypertension: Secondary | ICD-10-CM

## 2024-12-16 DIAGNOSIS — Z951 Presence of aortocoronary bypass graft: Secondary | ICD-10-CM

## 2024-12-16 DIAGNOSIS — Z23 Encounter for immunization: Secondary | ICD-10-CM

## 2024-12-16 DIAGNOSIS — I2510 Atherosclerotic heart disease of native coronary artery without angina pectoris: Secondary | ICD-10-CM

## 2024-12-16 DIAGNOSIS — Z85820 Personal history of malignant melanoma of skin: Secondary | ICD-10-CM

## 2024-12-16 DIAGNOSIS — N1831 Chronic kidney disease, stage 3a: Secondary | ICD-10-CM | POA: Diagnosis not present

## 2024-12-16 DIAGNOSIS — Z0000 Encounter for general adult medical examination without abnormal findings: Secondary | ICD-10-CM

## 2024-12-16 NOTE — Addendum Note (Signed)
 Addended by: WATT MIRZA on: 12/16/2024 04:45 PM   Modules accepted: Level of Service

## 2025-02-08 ENCOUNTER — Ambulatory Visit (HOSPITAL_COMMUNITY)

## 2025-02-09 ENCOUNTER — Ambulatory Visit: Admitting: Podiatry
